# Patient Record
Sex: Male | Born: 1935
Health system: Southern US, Community
[De-identification: ages and names within clinical notes are randomized; demographics above are authoritative.]

## PROBLEM LIST (undated history)

## (undated) DIAGNOSIS — N183 Chronic kidney disease, stage 3 unspecified: Secondary | ICD-10-CM

## (undated) DIAGNOSIS — M109 Gout, unspecified: Secondary | ICD-10-CM

## (undated) DIAGNOSIS — I129 Hypertensive chronic kidney disease with stage 1 through stage 4 chronic kidney disease, or unspecified chronic kidney disease: Secondary | ICD-10-CM

## (undated) DIAGNOSIS — D0310 Melanoma in situ of unspecified eyelid, including canthus: Secondary | ICD-10-CM

## (undated) DIAGNOSIS — N400 Enlarged prostate without lower urinary tract symptoms: Secondary | ICD-10-CM

## (undated) DIAGNOSIS — J45909 Unspecified asthma, uncomplicated: Secondary | ICD-10-CM

## (undated) DIAGNOSIS — G4733 Obstructive sleep apnea (adult) (pediatric): Secondary | ICD-10-CM

## (undated) DIAGNOSIS — T7840XA Allergy, unspecified, initial encounter: Secondary | ICD-10-CM

## (undated) DIAGNOSIS — I4819 Other persistent atrial fibrillation: Secondary | ICD-10-CM

## (undated) DIAGNOSIS — R05 Cough: Secondary | ICD-10-CM

## (undated) DIAGNOSIS — R059 Cough, unspecified: Secondary | ICD-10-CM

## (undated) HISTORY — DX: Allergy, unspecified, initial encounter: T78.40XA

## (undated) HISTORY — DX: Melanoma in situ of unspecified eyelid, including canthus: D03.10

## (undated) HISTORY — DX: Gout, unspecified: M10.9

## (undated) HISTORY — DX: Other persistent atrial fibrillation: I48.19

## (undated) HISTORY — DX: Chronic kidney disease, stage 3 unspecified: N18.30

## (undated) HISTORY — DX: Hypertensive chronic kidney disease with stage 1 through stage 4 chronic kidney disease, or unspecified chronic kidney disease: I12.9

## (undated) HISTORY — DX: Obstructive sleep apnea (adult) (pediatric): G47.33

## (undated) HISTORY — DX: Cough: R05

## (undated) HISTORY — DX: Cough, unspecified: R05.9

## (undated) HISTORY — DX: Unspecified asthma, uncomplicated: J45.909

## (undated) HISTORY — DX: Chronic kidney disease, stage 3 (moderate): N18.3

## (undated) HISTORY — DX: Benign prostatic hyperplasia without lower urinary tract symptoms: N40.0

---

## 1998-05-04 ENCOUNTER — Encounter: Payer: Self-pay | Admitting: General Surgery

## 1998-05-04 ENCOUNTER — Ambulatory Visit (HOSPITAL_COMMUNITY): Admission: RE | Admit: 1998-05-04 | Discharge: 1998-05-04 | Payer: Self-pay | Admitting: General Surgery

## 2002-03-26 ENCOUNTER — Encounter: Payer: Self-pay | Admitting: General Surgery

## 2002-03-26 ENCOUNTER — Encounter: Admission: RE | Admit: 2002-03-26 | Discharge: 2002-03-26 | Payer: Self-pay | Admitting: General Surgery

## 2002-04-25 ENCOUNTER — Encounter: Admission: RE | Admit: 2002-04-25 | Discharge: 2002-05-15 | Payer: Self-pay | Admitting: Neurological Surgery

## 2004-05-26 ENCOUNTER — Ambulatory Visit (HOSPITAL_COMMUNITY): Admission: RE | Admit: 2004-05-26 | Discharge: 2004-05-26 | Payer: Self-pay | Admitting: *Deleted

## 2004-05-26 ENCOUNTER — Encounter (INDEPENDENT_AMBULATORY_CARE_PROVIDER_SITE_OTHER): Payer: Self-pay | Admitting: *Deleted

## 2005-03-09 ENCOUNTER — Ambulatory Visit: Admission: RE | Admit: 2005-03-09 | Discharge: 2005-03-09 | Payer: Self-pay | Admitting: Otolaryngology

## 2005-03-09 ENCOUNTER — Ambulatory Visit (HOSPITAL_BASED_OUTPATIENT_CLINIC_OR_DEPARTMENT_OTHER): Admission: RE | Admit: 2005-03-09 | Discharge: 2005-03-09 | Payer: Self-pay | Admitting: Otolaryngology

## 2005-03-09 ENCOUNTER — Encounter (INDEPENDENT_AMBULATORY_CARE_PROVIDER_SITE_OTHER): Payer: Self-pay | Admitting: Specialist

## 2005-04-28 ENCOUNTER — Encounter (INDEPENDENT_AMBULATORY_CARE_PROVIDER_SITE_OTHER): Payer: Self-pay | Admitting: *Deleted

## 2005-04-28 ENCOUNTER — Ambulatory Visit (HOSPITAL_COMMUNITY): Admission: RE | Admit: 2005-04-28 | Discharge: 2005-04-28 | Payer: Self-pay | Admitting: Gastroenterology

## 2005-08-03 ENCOUNTER — Encounter (INDEPENDENT_AMBULATORY_CARE_PROVIDER_SITE_OTHER): Payer: Self-pay | Admitting: Specialist

## 2005-08-03 ENCOUNTER — Ambulatory Visit (HOSPITAL_COMMUNITY): Admission: RE | Admit: 2005-08-03 | Discharge: 2005-08-03 | Payer: Self-pay | Admitting: Gastroenterology

## 2006-07-27 ENCOUNTER — Ambulatory Visit (HOSPITAL_COMMUNITY): Admission: RE | Admit: 2006-07-27 | Discharge: 2006-07-27 | Payer: Self-pay | Admitting: Internal Medicine

## 2010-03-31 ENCOUNTER — Emergency Department (HOSPITAL_COMMUNITY): Admission: EM | Admit: 2010-03-31 | Discharge: 2010-03-31 | Payer: Self-pay | Admitting: Emergency Medicine

## 2010-09-10 ENCOUNTER — Other Ambulatory Visit: Payer: Self-pay | Admitting: Dermatology

## 2010-10-01 NOTE — Op Note (Signed)
NAME:  Austin [name], Austin [name] NO.:  [id number]   MEDICAL RECORD NO.:  [id number]          PATIENT TYPE:  AMB   LOCATION:  ENDO                         FACILITY:  [facility]   PHYSICIAN:  Petra Kuba, M.D.    DATE OF BIRTH:  1930-06-01   DATE OF PROCEDURE:  04/28/2005  DATE OF DISCHARGE:                                 OPERATIVE REPORT   PROCEDURE:  Esophagogastroduodenoscopy with biopsy.   INDICATIONS:  Episodic guaiac positivity in a patient with a nondiagnostic  colonoscopy last year, history of melanoma.  Consent was signed after risks,  benefits, methods, and options thoroughly discussed in the office.   MEDICATIONS:  Demerol 75 mg, Versed 8 mg.   PROCEDURE:  The video endoscope was inserted by direct vision.  The  esophagus was normal except for a small hiatal hernia.  He possibly had a  very short segment of Barrett's.  The GE junction was biopsied at the end of  the procedure.  The scope was inserted into the stomach, advanced in the  antrum.  A small antral polyp was seen.  It was cold biopsied and put in a  separate container.  The scope was advanced through a normal pylorus into a  normal duodenal bulb and around the C-loop to a normal second and probably  third and possibly the fourth part of the duodenum.  No signs of bleeding or  duodenal abnormality were seen.  The scope was slowly withdrawn back to the  bulb, which again confirmed its normal appearance.  The scope was withdrawn  back in the stomach and retroflexed.  High in the cardia the hiatal hernia  was confirmed.  There was a small red spot in the proximal part of the  stomach, possibly a small AVM that was not bleeding.  Angularis, fundus,  lesser and greater curve were normal otherwise on retroflexed visualization.  Straight visualization of the stomach did not reveal any additional  findings.  The biopsies of the polyp at the GE junction were obtained at  this time and with suction, the scope was  slowly withdrawn and again a good  look at the rest of the esophagus was normal.  The scope was removed.  The  patient tolerated the procedure well.  There was no obvious immediate  complication.   ENDOSCOPIC DIAGNOSES:  1.  Small hiatal hernia, doubt Barrett's, status post biopsy.  2.  Antral small polyp, broad-based; however, status post biopsy, slightly      atypical-looking.  3.  One proximal stomach red spot, possible arteriovenous malformation.  4.  Otherwise normal esophagogastroduodenoscopy.   PLAN:  Await pathology.  If the biopsies are nonworrisome, consider  rechecking guaiacs and CBC in one month just to make sure no further workup  plans are needed; however, may need more aggressive polypectomy pending  pathology.           ______________________________  Petra Kuba, M.D.     MEM/MEDQ  D:  04/28/2005  T:  04/29/2005  Job:  [id number]   cc:   Geoffry Paradise, M.D.  Fax: [phone]

## 2010-10-01 NOTE — Op Note (Signed)
NAME:  Austin [name], Austin [name] NO.:  [id number]   MEDICAL RECORD NO.:  [id number]          PATIENT TYPE:  AMB   LOCATION:  ENDO                         FACILITY:  [facility]   PHYSICIAN:  Petra Kuba, M.D.    DATE OF BIRTH:  [birth year]   DATE OF PROCEDURE:  08/03/2005  DATE OF DISCHARGE:                                 OPERATIVE REPORT   PROCEDURE PERFORMED:  Esophagogastroduodenoscopy with biopsy.   ENDOSCOPIST:  Petra Kuba, M.D.   INDICATIONS FOR PROCEDURE:  Want to recheck gastric polyp.  Consent was  signed after the risks, benefits, methods and options were thoroughly  discussed multiple times in the past.   MEDICINES USED:  Fentanyl 50 mcg, Versed 4 mg.   DESCRIPTION OF PROCEDURE:  The video endoscope was inserted by direct  vision.  Esophagus was normal.  He did have a small hiatal hernia.  Scope  passed into the stomach and advanced to the antrum.  The polyp was not any  worse-looking but slightly ulcerative, semi-sessile.  Multiple biopsies were  obtained using the Jumbo forceps.  The scope was inserted through a normal  pylorus into a normal duodenal bulb and around the C-loop to a normal second  portion of the duodenum.  The scope was withdrawn back to the bulb and a  good look there ruled out abnormalities in that location.  Scope was  withdrawn back to the stomach which was evaluated on straight and retroflex  visualization with good look at the cardia, fundus, angularis, lesser and  greater curve and no additional findings were seen.  After the biopsies were  obtained, air was suctioned, scope was slowly withdrawn.  Again, a good look  at the esophagus was normal.  Scope was removed.  The patient tolerated the  procedure well.  There were no obvious immediate complication.   ENDOSCOPIC DIAGNOSIS:  1.  Small hiatal hernia.  2.  Antral polyp, no change.  Status post biopsies with Jumbo forceps.  3.  Otherwise within normal limits  esophagogastroduodenoscopy.   PLAN:  Await pathology.  I have recommended using Prilosec over-the-counter  if he is using lots of Indocin to help protect the stomach.  Happy to see  back p.r.n. or in three to four months just to check and make sure no  further work-up plans are needed.           ______________________________  Petra Kuba, M.D.     MEM/MEDQ  D:  08/03/2005  T:  08/04/2005  Job:  [id number]   cc:   Geoffry Paradise, M.D.  Fax: [phone]

## 2010-10-01 NOTE — Op Note (Signed)
NAME:  Austin [name], Austin [name] NO.:  [id number]   MEDICAL RECORD NO.:  [id number]          PATIENT TYPE:  AMB   LOCATION:  DSC                          FACILITY:  [facility]   PHYSICIAN:  Karol T. Lazarus Salines, M.D. DATE OF BIRTH:  08/18/1930   DATE OF PROCEDURE:  03/09/2005  DATE OF DISCHARGE:                                 OPERATIVE REPORT   PREOPERATIVE DIAGNOSIS:  Possible melanoma, right conchal bowl.   POSTOPERATIVE DIAGNOSIS:  Possible melanoma, right conchal bowl.   PROCEDURE PERFORMED:  Wide excision, right conchal bowl, with full-thickness  skin graft reconstruction (postauricular donor site).   SURGEON:  Gloris Manchester. Lazarus Salines, M.D.   ANESTHESIA:  Local.   BLOOD LOSS:  Minimal.   COMPLICATIONS:  None.   FINDINGS:  A small healing ulcer from a previous shave biopsy per Dr.  Londell Moh in the posterior aspect of the conchal bowl.  Wide excision  accomplished encompassing approximately 1 cm margin and including auricular  cartilage, with no additional findings.   PROCEDURE:  With the patient in a comfortable supine position, a minimal  hair shave was performed.  Xylocaine 1% with 1:100,000 epinephrine, 15 mL  total, was infiltrated into the postauricular crease, against the post  auricular skin, and on both surfaces of the pinna in stages.  A sterile  preparation and draping of the right ear was accomplished.   Anesthesia was tested with an Adson forceps and was observed to be adequate.  The lesion was identified as above.  Ink marks were placed circumferentially  around the lesion at roughly 1 cm interval for operative orientation.  Under  direct vision, the skin was incised and carried down to and then through the  auricular cartilage.  This was done circumferentially around the lesion.  Hemostasis was spontaneous.  The cartilage was elevated and the  perichondrium was dissected off of the posterior surface using a [employer] researcher.  The lesion was oriented with silk  sutures and then was removed.  Hemostasis was spontaneous.   A roughly 2.5 defect remained.  The same size defect was outlined in the  post auricular skin and sharply incised and carried into the dermis.  The  skin graft was carefully removed in the dermal layer with mild oozing from  the donor site.  Upon removing the graft, it was placed inverted on a tongue  blade and the subdermal layer was carefully and sharply removed to thin the  graft to a thick full-thickness graft.  This was placed into the recipient  site and oriented appropriately.  It was secured with interrupted 5-0  chromic sutures around the anterior periphery, and with some deep quilting  stitches to attach the skin graft to the perichondrium.  The skin over the  antihelix was rolled down into the defect for a slight distance with the  same material and then the skin graft was approximated.  A good closure of  the entire defect was accomplished.   Returning to the donor site, the subcuticular fat was dissected  to relax  the wound.  It was closed along preexisting wrinkles but a  small gap was  left.  Ethilon 4-0 sutures were used.  Hemostasis was observed.   Bacitracin ointment was applied to all the raw surfaces.  A standard Kerlix  fluff and Kling mastoid dressing was applied.  The patient had been awake  for the entire procedure and tolerated it well.  At this point he was  returned to the holding area in preparation for discharge home.   COMMENT:  A 77 year old white male with a pigmented lesion of the conchal  bowl times several years with recent change in appearance.  A shave biopsy  per Dr. Leta Speller suggested possible melanoma in situ, hence the  indication for today's procedure.  Anticipate a routine postoperative  recovery with attention to ice, elevation, analgesia, antibiosis.  Will  remove the dressing in three days and then clean the wound thereafter.  I  will call them with a pathology report and  will make any further decisions  as appropriate.      Gloris Manchester. Lazarus Salines, M.D.  Electronically Signed     KTW/MEDQ  D:  03/09/2005  T:  03/09/2005  Job:  [id number]   cc:   Norval Gable. [place], M.D.  Fax: [phone]   Geoffry Paradise, M.D.  Fax: [phone]

## 2010-12-17 ENCOUNTER — Other Ambulatory Visit: Payer: Self-pay | Admitting: Dermatology

## 2011-03-02 DIAGNOSIS — H02059 Trichiasis without entropian unspecified eye, unspecified eyelid: Secondary | ICD-10-CM | POA: Insufficient documentation

## 2011-03-02 DIAGNOSIS — C6900 Malignant neoplasm of unspecified conjunctiva: Secondary | ICD-10-CM | POA: Insufficient documentation

## 2011-04-19 ENCOUNTER — Other Ambulatory Visit: Payer: Self-pay | Admitting: Dermatology

## 2011-06-01 DIAGNOSIS — H16239 Neurotrophic keratoconjunctivitis, unspecified eye: Secondary | ICD-10-CM | POA: Insufficient documentation

## 2011-06-24 ENCOUNTER — Other Ambulatory Visit: Payer: Self-pay | Admitting: Dermatology

## 2012-01-20 ENCOUNTER — Other Ambulatory Visit: Payer: Self-pay | Admitting: Dermatology

## 2012-04-23 ENCOUNTER — Other Ambulatory Visit: Payer: Self-pay | Admitting: Dermatology

## 2012-10-05 ENCOUNTER — Other Ambulatory Visit: Payer: Self-pay | Admitting: Dermatology

## 2012-12-25 ENCOUNTER — Other Ambulatory Visit: Payer: Self-pay

## 2013-03-13 ENCOUNTER — Other Ambulatory Visit: Payer: Self-pay | Admitting: Dermatology

## 2013-03-19 ENCOUNTER — Encounter: Payer: Self-pay | Admitting: Internal Medicine

## 2013-03-19 ENCOUNTER — Ambulatory Visit (INDEPENDENT_AMBULATORY_CARE_PROVIDER_SITE_OTHER): Payer: Medicare Other | Admitting: Internal Medicine

## 2013-03-19 ENCOUNTER — Ambulatory Visit
Admission: RE | Admit: 2013-03-19 | Discharge: 2013-03-19 | Disposition: A | Discharge status: Home / Self Care | Payer: Medicare Other | Source: Ambulatory Visit | Attending: Internal Medicine | Admitting: Internal Medicine

## 2013-03-19 VITALS — BP 131/81 | HR 96 | Temp 97.8°F | Ht 67.5 in | Wt 165.0 lb

## 2013-03-19 DIAGNOSIS — R509 Fever, unspecified: Secondary | ICD-10-CM

## 2013-03-19 DIAGNOSIS — D0310 Melanoma in situ of unspecified eyelid, including canthus: Secondary | ICD-10-CM | POA: Insufficient documentation

## 2013-03-19 LAB — CBC WITH DIFFERENTIAL/PLATELET
Basophils Relative: 0 % (ref 0–1)
Eosinophils Absolute: 0.3 10*3/uL (ref 0.0–0.7)
Eosinophils Relative: 4 % (ref 0–5)
HCT: 45.3 % (ref 39.0–52.0)
Hemoglobin: 16.5 g/dL (ref 13.0–17.0)
MCH: 31.3 pg (ref 26.0–34.0)
MCHC: 36.4 g/dL — ABNORMAL HIGH (ref 30.0–36.0)
MCV: 86 fL (ref 78.0–100.0)
Monocytes Absolute: 1 10*3/uL (ref 0.1–1.0)
Monocytes Relative: 12 % (ref 3–12)

## 2013-03-19 LAB — LACTATE DEHYDROGENASE: LDH: 184 U/L (ref 94–250)

## 2013-03-19 NOTE — Assessment & Plan Note (Addendum)
He has previously had a workup and I will add to it a malaria slide, a repeat CBC and quantity are on along with chest x-ray to be sure there is no adenopathy. I discussed with him and his wife at length that other evaluation is not indicated since he is not having any concerning signs such as weight loss. Typically since his fevers have really been intermittent happening will generally less than one/month.  At this time, she will be called if there are any significant abnormalities, otherwise he will return if he has any new signs, worsening symptoms. He is going to otherwise followup with his primary physician for further symptoms and I did explain that this can last sometimes over a year but generally is associated with a good prognosis when no other symptoms are found.   He is comfortable with this plan and information was given to call if he has any concerns or if there any changes.  ADDENDUM 03/28/13: CT chest done to follow up on CXR.  Nodule noted but small.  I do not feel it is related to the fever and routine monitoring is be ok (repeat in 3 months CT without contrast).  If significantly changed, he will need further follow up.

## 2013-03-19 NOTE — Progress Notes (Signed)
Subjective:    Patient ID: Austin [name], male    DOB: 1931-03-15, 79 y.o.   MRN: [id number]  HPI He comes in for evaluation as a new patient.  He is being seen for fever of unknown origin. History is provided by the patient and his wife. He has a history of melanoma of the right eyelid and otherwise has done well. He first developed fever in February of this year and develops a temperature of 202 associated with chills. He then has had periodic fever about monthly including March, 2 times in April, May June twice and then not again until September and October. Each time fever went up to or about at 102.  No associated symptoms including no cough, shortness of breath, diarrhea, back pain, rashes, leg swelling, joint swelling or joint aches, no myalgias, no headache, no dizziness, no lymphadenopathy. He has had no weight loss during this time and otherwise feels his usual self except for the day he has the fever which tends to make him tired. He has traveled to [name] 7 years ago and otherwise in the last year has been to the [place]. He also has travel to [place]. No sick contacts. He was exposed to somebody with tuberculosis when he was a baby. Does not recall another PPD or chest for that since. He has had a CT scan of his abdomen as well as an MRI of his abdomen done at [facility] for his melanoma followup and is without any significant abnormalities. He did have evaluation by his primary doctor in May of 2014 when this first began and was essentially reassuring with a CBC that was within normal limits, CMP that was normal, a protein electrophoresis that was normal, Lyme antibody was negative, CEA was normal, CEA 19-9 was normal recommend spotted fever was normal, ESR and CRP were within normal limits and blood cultures done were negative. He did not followup after that in regards to this until mid October when he complained of it occurring still. No night sweats. No new symptoms after his initial  evaluation for May. No other evaluation. His MRI at [facility] was reviewed and no significant abnormalities. His last chest x-ray was 2012. He has not been on any antibiotics for this.   Review of Systems  Constitutional: Positive for fever and chills. Negative for diaphoresis, activity change, appetite change, fatigue and unexpected weight change.  HENT: Negative for sore throat.   Eyes: Negative for discharge and visual disturbance.  Respiratory: Negative for cough and shortness of breath.   Cardiovascular: Negative for chest pain, palpitations and leg swelling.  Gastrointestinal: Negative for nausea, abdominal pain and diarrhea.  Endocrine: Negative for cold intolerance and heat intolerance.  Genitourinary: Negative for dysuria, hematuria and testicular pain.  Skin: Negative for rash and wound.  Allergic/Immunologic: Negative for immunocompromised state.  Neurological: Negative for dizziness, seizures, facial asymmetry, weakness, light-headedness, numbness and headaches.  Hematological: Negative for adenopathy.  Psychiatric/Behavioral: Negative for dysphoric mood. The patient is not nervous/anxious.        Objective:   Physical Exam  Constitutional: He is oriented to person, place, and time. He appears well-developed and well-nourished. No distress.  HENT:  Mouth/Throat: No oropharyngeal exudate.  Eyes: Right eye exhibits no discharge. Left eye exhibits no discharge. No scleral icterus.  Cardiovascular: Normal rate, regular rhythm and normal heart sounds.   No murmur heard. Pulmonary/Chest: Effort normal and breath sounds normal. No respiratory distress. He has no wheezes.  Abdominal: Soft. Bowel sounds are normal.  He exhibits no distension.  Lymphadenopathy:    He has no cervical adenopathy.  Neurological: He is alert and oriented to person, place, and time.  Skin: Skin is warm and dry. No rash noted.  Psychiatric: He has a normal mood and affect. His behavior is normal.           Assessment & Plan:

## 2013-03-19 NOTE — Addendum Note (Signed)
Addended by: Gardiner Barefoot on: 03/19/2013 04:34 PM   Modules accepted: Level of Service

## 2013-03-20 LAB — MALARIA SMEAR
Result - MALAR: NEGATIVE
Result - MALAR: NONE SEEN

## 2013-03-21 ENCOUNTER — Other Ambulatory Visit: Payer: Self-pay | Admitting: Internal Medicine

## 2013-03-21 ENCOUNTER — Telehealth: Payer: Self-pay | Admitting: *Deleted

## 2013-03-21 DIAGNOSIS — R911 Solitary pulmonary nodule: Secondary | ICD-10-CM

## 2013-03-21 LAB — QUANTIFERON TB GOLD ASSAY (BLOOD)
Interferon Gamma Release Assay: NEGATIVE
TB Ag value: 0.03 IU/mL

## 2013-03-21 NOTE — Telephone Encounter (Signed)
CT scheduled for 03/26/13 at [facility], patient notified. Austin [name]

## 2013-03-21 NOTE — Telephone Encounter (Signed)
Message copied by Macy Mis on Thu Mar 21, 2013 10:45 AM ------      Message from: Gardiner Barefoot      Created: Thu Mar 21, 2013  8:46 AM       He has a possible nodule on his chest xray, though it likely is a shadow.  The radiologist has recommended a CT scan of his chest and I think with the ongoing fever, he should have this done to be sure.  CT chest without contrast is fine.  Not urgent. Alll other labs are normal to date.  thanks ------

## 2013-03-22 ENCOUNTER — Telehealth: Payer: Self-pay | Admitting: *Deleted

## 2013-03-22 NOTE — Telephone Encounter (Signed)
Obtained PA for scheduled CT.  Procedure authorized through 04/20/13, approval # [id number]. Andree Coss, RN

## 2013-03-26 ENCOUNTER — Ambulatory Visit
Admission: RE | Admit: 2013-03-26 | Discharge: 2013-03-26 | Disposition: A | Discharge status: Home / Self Care | Payer: Medicare Other | Source: Ambulatory Visit | Attending: Internal Medicine | Admitting: Internal Medicine

## 2013-03-26 DIAGNOSIS — R911 Solitary pulmonary nodule: Secondary | ICD-10-CM

## 2013-03-27 ENCOUNTER — Telehealth: Payer: Self-pay | Admitting: *Deleted

## 2013-03-27 NOTE — Telephone Encounter (Signed)
Message copied by Macy Mis on Wed Mar 27, 2013  1:57 PM ------      Message from: Gardiner Barefoot      Created: Wed Mar 27, 2013  1:27 PM       CT scan of chest looks fine.  It does show a tiny (6 mm) nodule that is what was seen on x ray.  It does not at all explain the fever and is not significant.  Radiology does suggest a repeat in 3 months to assure it is not changing to be extra sure.          Please let the patient know and I will let his PCP know to follow up with it.  thanks ------

## 2013-03-27 NOTE — Telephone Encounter (Signed)
Left voice mail to return call. Please see Dr. Ephriam Knuckles note. [name]

## 2013-09-10 ENCOUNTER — Other Ambulatory Visit: Payer: Self-pay | Admitting: Internal Medicine

## 2013-09-10 DIAGNOSIS — R918 Other nonspecific abnormal finding of lung field: Secondary | ICD-10-CM

## 2013-09-11 ENCOUNTER — Other Ambulatory Visit: Payer: Self-pay | Admitting: Dermatology

## 2013-09-17 ENCOUNTER — Ambulatory Visit
Admission: RE | Admit: 2013-09-17 | Discharge: 2013-09-17 | Disposition: A | Discharge status: Home / Self Care | Payer: Medicare Other | Source: Ambulatory Visit | Attending: Internal Medicine | Admitting: Internal Medicine

## 2013-09-17 ENCOUNTER — Encounter (INDEPENDENT_AMBULATORY_CARE_PROVIDER_SITE_OTHER): Payer: Self-pay

## 2013-09-17 DIAGNOSIS — R918 Other nonspecific abnormal finding of lung field: Secondary | ICD-10-CM

## 2014-03-07 ENCOUNTER — Other Ambulatory Visit: Payer: Self-pay | Admitting: Internal Medicine

## 2014-03-07 ENCOUNTER — Other Ambulatory Visit (HOSPITAL_COMMUNITY): Payer: Self-pay | Admitting: Internal Medicine

## 2014-03-07 ENCOUNTER — Ambulatory Visit
Admission: RE | Admit: 2014-03-07 | Discharge: 2014-03-07 | Disposition: A | Discharge status: Home / Self Care | Payer: Medicare Other | Source: Ambulatory Visit | Attending: Internal Medicine | Admitting: Internal Medicine

## 2014-03-07 ENCOUNTER — Ambulatory Visit (HOSPITAL_COMMUNITY)
Admission: RE | Admit: 2014-03-07 | Discharge: 2014-03-07 | Disposition: A | Discharge status: Home / Self Care | Payer: Medicare Other | Source: Ambulatory Visit | Attending: Internal Medicine | Admitting: Internal Medicine

## 2014-03-07 DIAGNOSIS — R404 Transient alteration of awareness: Secondary | ICD-10-CM | POA: Diagnosis not present

## 2014-06-05 ENCOUNTER — Other Ambulatory Visit: Payer: Self-pay | Admitting: Dermatology

## 2014-09-04 ENCOUNTER — Other Ambulatory Visit: Payer: Self-pay | Admitting: Dermatology

## 2014-09-23 ENCOUNTER — Other Ambulatory Visit: Payer: Self-pay | Admitting: Gastroenterology

## 2015-03-19 ENCOUNTER — Ambulatory Visit (INDEPENDENT_AMBULATORY_CARE_PROVIDER_SITE_OTHER): Payer: Medicare Other | Admitting: Neurology

## 2015-03-19 ENCOUNTER — Encounter: Payer: Self-pay | Admitting: Neurology

## 2015-03-19 VITALS — BP 132/83 | HR 76 | Resp 16 | Ht 68.5 in | Wt 166.4 lb

## 2015-03-19 DIAGNOSIS — J441 Chronic obstructive pulmonary disease with (acute) exacerbation: Secondary | ICD-10-CM | POA: Diagnosis not present

## 2015-03-19 DIAGNOSIS — R0689 Other abnormalities of breathing: Secondary | ICD-10-CM | POA: Insufficient documentation

## 2015-03-19 DIAGNOSIS — R0683 Snoring: Secondary | ICD-10-CM | POA: Diagnosis not present

## 2015-03-19 DIAGNOSIS — J4530 Mild persistent asthma, uncomplicated: Secondary | ICD-10-CM | POA: Diagnosis not present

## 2015-03-19 NOTE — Patient Instructions (Signed)

## 2015-03-19 NOTE — Progress Notes (Signed)
SLEEP MEDICINE CLINIC   Provider:  Larey Seat, M D  Referring Provider: Burnard Bunting, MD Primary Care Physician:  Geoffery Lyons, MD  Chief Complaint  Patient presents with  . NP Reynaldo Minium  Sleep Apnea/ Snoring  Rm 10   Chief complaint according to patient : " I snore "   HPI:  Austin [name] is a 81 y.o. male , seen here as a referral from Dr. Reynaldo Minium for a sleep consultation,  Mr. Younglove is seen here in the presence of his wife out of concern for his sleep breathing. The patient has a history of asthma and COPD as well as hypertension. He has a history of corneal ulcer form shingles and has cancer. He was treated with chemotherapy in the treatment of melanoma and developed cataracts. By now he has lost sight in his right eye which is cloudy with neovascularization. Mr. Fitzhenry has been witnessed to snore and his wife has occasionally noted him to grasp or bruits a little irregular but she is not sure that he has any apnea per se. He does not sleep in supine position but on his side. Occasionally he will wake up from an gasping for air or from a snorer and that is more likely if he fell asleep in a recliner or in a seated position.    Sleep habits are as follows: 9.00 PM is his usual bedtime but he developed a very long sleep latency. He is usually ready by the time he goes to bed to sleep and does not feel that he has racing thoughts or extreme worry is keeping him from sleeping. He has begun taking a sleep aid 1 hour before intended bedtime which helps him to initiate sleep, but sleep is interrupted 2-3 times by his urge to urinate. He rises in the morning at 5:30 spontaneously or being woken by his dog. He feels ready to go in the morning restored or refreshed. After lunch he likes to nap this is usually 15-30 minutes. He does not report dreams and his nap time and he does not dream a lot at night either. His wife has reported that he sleep talks. He is not a sleep walker  and he reports no nightmares, or REM behavior. He will get about 7 hours of nocturnal sleep, sleeps on his side not in supine. The bedroom is cool, quiet and dark.   Sleep medical history and family sleep history: all siblings deceased, no known sleep disorder history.   Social history:  Married, right-handed Caucasian male retired. 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 He is a nonsmoker, he does drink alcohol socially, less than 4 drinks a week. 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  Review of Systems: Out of a complete 14 system review, the patient complains of only the following symptoms, and all other reviewed systems are negative.  skin lesions, itching,  Blindness right eye .  Epworth score 7, Fatigue severity score 35   , depression score see below.    Social History   Social History  . Marital Status: Married    Spouse Name: [name]  . Number of Children: [name]  . Years of Education: [name]   Occupational History  . Not on file.   Social History Main Topics  . Smoking status: Never Smoker   . Smokeless tobacco: Never Used  . Alcohol Use: 0.6 oz/week    1 Standard drinks or equivalent per week  Comment: occ  . Drug Use: No  . Sexual Activity: Not on file   Other Topics Concern  . Not on file   Social History Narrative   Right Handed.  Caffiene  Coffee, tea 3 cups daily.   Pt retired.     Lives at home.      Family History  Problem Relation Age of Onset  . Hypertension Father   . Lung cancer Father     Past Medical History  Diagnosis Date  . Melanoma in situ of right eyelid ([place])   . Allergy   . Asthma   . Gout   . Cough   . Snoring   . CKD (chronic kidney disease) stage 3, GFR 30-59 ml/min   . Hypertensive chronic kidney disease   . BPH (benign prostatic hypertrophy)     Past Surgical History  Procedure Laterality Date  . Eye surgery      Current Outpatient Prescriptions  Medication Sig Dispense  Refill  . albuterol (PROVENTIL) (2.5 MG/3ML) 0.083% nebulizer solution Take 2.5 mg by nebulization every 6 (six) hours as needed for wheezing or shortness of breath.    Marland Kitchen amLODipine (NORVASC) 10 MG tablet Take 10 mg by mouth daily.    . budesonide-formoterol (SYMBICORT) 160-4.5 MCG/ACT inhaler Inhale 2 puffs into the lungs 2 (two) times daily.    . butalbital-acetaminophen-caffeine (FIORICET WITH CODEINE) 50-325-40-30 MG per capsule Take 1 capsule by mouth every 4 (four) hours as needed for headache.    . chlorpheniramine-HYDROcodone (TUSSIONEX PENNKINETIC ER) 10-8 MG/5ML SUER Take 5 mLs by mouth.    . clobetasol cream (TEMOVATE) 0.05 %     . fluocinonide gel (LIDEX) 0.05 %     . fluticasone (FLONASE) 50 MCG/ACT nasal spray Place 2 sprays into both nostrils daily.  8  . gabapentin (NEURONTIN) 300 MG capsule TAKE 1 CAPSULE NIGHTLY AND INCREASE TO 1 CAPSULE EVERY 8 HOURS AS TOLERATED  5  . hydrOXYzine (ATARAX/VISTARIL) 25 MG tablet     . ibuprofen (ADVIL,MOTRIN) 200 MG tablet Take 200 mg by mouth every 6 (six) hours as needed.    . indomethacin (INDOCIN) 50 MG capsule Take 50 mg by mouth 2 (two) times daily with a meal.    . LORazepam (ATIVAN) 1 MG tablet Take 1 mg by mouth.    . losartan (COZAAR) 50 MG tablet Take 50 mg by mouth daily.    Marland Kitchen losartan (COZAAR) 50 MG tablet Take 50 mg by mouth.    . magnesium oxide (MAG-OX) 400 MG tablet Take 400 mg by mouth at bedtime.    Marland Kitchen oxyCODONE-acetaminophen (PERCOCET) 10-325 MG per tablet Take 1 tablet by mouth every 4 (four) hours as needed for pain.    . predniSONE (DELTASONE) 20 MG tablet daily as needed.  4  . valACYclovir (VALTREX) 1000 MG tablet Take 1,000 mg by mouth daily.    Marland Kitchen zolpidem (AMBIEN) 10 MG tablet Take 10 mg by mouth at bedtime as needed. for sleep  5   No current facility-administered medications for this visit.    Allergies as of 03/19/2015 - Review Complete 03/19/2015  Allergen Reaction Noted  . Ivp dye [iodinated diagnostic  agents]  03/19/2013  . Penicillins Swelling 03/19/2013  . Codeine Rash 03/19/2013    Vitals: BP 132/83 mmHg  Pulse 76  Resp 16  Ht 5' 8.5" (1.74 m)  Wt 166 lb 6.4 oz (75.479 kg)  BMI 24.93 kg/m2 Last Weight:  Wt Readings from Last 1 Encounters:  03/19/15 166  lb 6.4 oz (75.479 kg)   PJS:RPRX mass index is 24.93 kg/(m^2).     Last Height:   Ht Readings from Last 1 Encounters:  03/19/15 5' 8.5" (1.74 m)    Physical exam:  General: The patient is awake, alert and appears not in acute distress. The patient is well groomed. Head: Normocephalic, atraumatic. Neck is supple. Mallampati 3  neck circumference:15. Nasal airflow  unrestricted , TMJ is evident . Retrognathia is not seen.  Cardiovascular:  Regular rate and rhythm , without  murmurs or carotid bruit, and without distended neck veins. Respiratory: Lungs are clear to auscultation. Skin:  Without evidence of edema, or rash Trunk: BMI is normal . The patient's posture is erect  Neurologic exam : The patient is awake and alert, oriented to place and time.   Memory subjective described as intact.  lity appears normal.  Speech is fluent,  without dysarthria, dysphonia or aphasia.  Mood and affect are appropriate.  Cranial nerves: Pupils are equal and briskly reactive to light. Funduscopic exam without  evidence of pallor or edema.  Extraocular movements  in vertical and horizontal planes intact and without nystagmus. Visual fields by finger perimetry are intact. Hearing to finger rub intact.   Facial sensation intact to fine touch.  Facial motor strength is symmetric and tongue and uvula move midline. Shoulder shrug was symmetrical.   Motor exam:  Normal tone, muscle bulk and symmetric strength in all extremities.  Sensory:  Fine touch, pinprick and vibration were tested in all extremities. Proprioception tested in the upper extremities was normal.  Coordination: Rapid alternating movements in the fingers/hands was normal.  Finger-to-nose maneuver  normal without evidence of ataxia, dysmetria or tremor.  Gait and station: Patient walks without assistive device and is able unassisted to climb up to the exam table. Strength within normal limits.  Stance is stable and normal.  Toe and heel stand were tested .Tandem gait is unfragmented. Turns with 3 Steps. Romberg testing is negative.  Deep tendon reflexes: in the upper and lower extremities are symmetric and intact. Babinski maneuver response is downgoing.  The patient was advised of the nature of the diagnosed sleep disorder , the treatment options and risks for general a health and wellness arising from not treating the condition.  I spent more than 35  minutes of face to face time with the patient. Greater than 50% of time was spent in counseling and coordination of care. We have discussed the diagnosis and differential and I answered the patient's questions.     Assessment:  After physical and neurologic examination, review of laboratory studies,  Personal review of imaging studies, reports of other /same  Imaging studies ,  Results of polysomnography/ neurophysiology testing and pre-existing records as far as provided in visit., my assessment is   1) Mr. Greulich has moderate risk factors IH and gender for sleep apnea but he is neither obese nor does he have a narrow upper airway. From his wife's observation I would conclude that he may be having hypotony is snoring a more mild form of nocturnal wheezing . He does not seem to breath irregularly. His comorbidities are COPD and asthma and he may well have hypoxemia and hypercapnia. I will order a sleep study as a split-night protocol with capnography. We will meet after the sleep study to discuss the best treatment options if only a mild apnea is present and oxygen desaturations are mild I would refer him for a dental device rather than a  CPAP machine. 2) nocturnal leg cramps- obtain circulatory data with PCP.  No RLS  by description.   Plan:  Treatment plan and additional workup :  Split AHi 20 and score at 3 % ,    Larey Seat MD  03/19/2015   CC: Burnard Bunting, [address] [place], [place] [address]

## 2015-04-05 ENCOUNTER — Ambulatory Visit (INDEPENDENT_AMBULATORY_CARE_PROVIDER_SITE_OTHER): Payer: Medicare Other | Admitting: Neurology

## 2015-04-05 DIAGNOSIS — G4733 Obstructive sleep apnea (adult) (pediatric): Secondary | ICD-10-CM | POA: Diagnosis not present

## 2015-04-05 DIAGNOSIS — J441 Chronic obstructive pulmonary disease with (acute) exacerbation: Secondary | ICD-10-CM

## 2015-04-05 DIAGNOSIS — J4530 Mild persistent asthma, uncomplicated: Secondary | ICD-10-CM

## 2015-04-05 NOTE — Sleep Study (Signed)
Please see the scanned sleep study interpretation located in the Procedure tab within the Chart Review section. 

## 2015-04-06 ENCOUNTER — Emergency Department (HOSPITAL_BASED_OUTPATIENT_CLINIC_OR_DEPARTMENT_OTHER)
Admission: EM | Admit: 2015-04-06 | Discharge: 2015-04-06 | Disposition: A | Discharge status: Home / Self Care | Payer: Medicare Other | Attending: Emergency Medicine | Admitting: Emergency Medicine

## 2015-04-06 ENCOUNTER — Encounter (HOSPITAL_BASED_OUTPATIENT_CLINIC_OR_DEPARTMENT_OTHER): Payer: Self-pay | Admitting: *Deleted

## 2015-04-06 DIAGNOSIS — N183 Chronic kidney disease, stage 3 (moderate): Secondary | ICD-10-CM | POA: Insufficient documentation

## 2015-04-06 DIAGNOSIS — Z880 Allergy status to penicillin: Secondary | ICD-10-CM | POA: Diagnosis not present

## 2015-04-06 DIAGNOSIS — J45909 Unspecified asthma, uncomplicated: Secondary | ICD-10-CM | POA: Insufficient documentation

## 2015-04-06 DIAGNOSIS — Z85840 Personal history of malignant neoplasm of eye: Secondary | ICD-10-CM | POA: Diagnosis not present

## 2015-04-06 DIAGNOSIS — R0600 Dyspnea, unspecified: Secondary | ICD-10-CM | POA: Diagnosis present

## 2015-04-06 DIAGNOSIS — Z8739 Personal history of other diseases of the musculoskeletal system and connective tissue: Secondary | ICD-10-CM | POA: Diagnosis not present

## 2015-04-06 DIAGNOSIS — Z79899 Other long term (current) drug therapy: Secondary | ICD-10-CM | POA: Insufficient documentation

## 2015-04-06 DIAGNOSIS — T782XXA Anaphylactic shock, unspecified, initial encounter: Secondary | ICD-10-CM

## 2015-04-06 DIAGNOSIS — Z7951 Long term (current) use of inhaled steroids: Secondary | ICD-10-CM | POA: Diagnosis not present

## 2015-04-06 DIAGNOSIS — I129 Hypertensive chronic kidney disease with stage 1 through stage 4 chronic kidney disease, or unspecified chronic kidney disease: Secondary | ICD-10-CM | POA: Diagnosis not present

## 2015-04-06 LAB — CBC WITH DIFFERENTIAL/PLATELET
Basophils Absolute: 0 10*3/uL (ref 0.0–0.1)
Basophils Relative: 0 %
Eosinophils Absolute: 0 10*3/uL (ref 0.0–0.7)
Eosinophils Relative: 0 %
HCT: 50.6 % (ref 39.0–52.0)
HEMOGLOBIN: 16.8 g/dL (ref 13.0–17.0)
LYMPHS ABS: 4.7 10*3/uL — AB (ref 0.7–4.0)
Lymphocytes Relative: 27 %
MCH: 30.4 pg (ref 26.0–34.0)
MCHC: 33.2 g/dL (ref 30.0–36.0)
MCV: 91.5 fL (ref 78.0–100.0)
MONOS PCT: 6 %
Monocytes Absolute: 1.1 10*3/uL — ABNORMAL HIGH (ref 0.1–1.0)
NEUTROS ABS: 11.9 10*3/uL — AB (ref 1.7–7.7)
NEUTROS PCT: 67 %
PLATELETS: 348 10*3/uL (ref 150–400)
RBC: 5.53 MIL/uL (ref 4.22–5.81)
RDW: 14.6 % (ref 11.5–15.5)
WBC: 17.7 10*3/uL — ABNORMAL HIGH (ref 4.0–10.5)

## 2015-04-06 LAB — BASIC METABOLIC PANEL
Anion gap: 12 (ref 5–15)
BUN: 26 mg/dL — ABNORMAL HIGH (ref 6–20)
CALCIUM: 8.9 mg/dL (ref 8.9–10.3)
CHLORIDE: 105 mmol/L (ref 101–111)
CO2: 23 mmol/L (ref 22–32)
CREATININE: 1.49 mg/dL — AB (ref 0.61–1.24)
GFR, EST AFRICAN AMERICAN: 49 mL/min — AB (ref >60)
GFR, EST NON AFRICAN AMERICAN: 42 mL/min — AB (ref >60)
Glucose, Bld: 146 mg/dL — ABNORMAL HIGH (ref 65–99)
Potassium: 4 mmol/L (ref 3.5–5.1)
SODIUM: 140 mmol/L (ref 135–145)

## 2015-04-06 MED ORDER — PREDNISONE 10 MG PO TABS: 40.0000 mg | ORAL_TABLET | Freq: Every day | ORAL | Status: DC

## 2015-04-06 MED ORDER — DIPHENHYDRAMINE HCL 25 MG PO TABS
25.0000 mg | ORAL_TABLET | Freq: Four times a day (QID) | ORAL | Status: DC | PRN
Start: 2015-04-06 — End: 2015-09-09

## 2015-04-06 MED ORDER — METHYLPREDNISOLONE SODIUM SUCC 125 MG IJ SOLR
INTRAMUSCULAR | Status: AC
Start: 2015-04-06 — End: 2015-04-06
  Administered 2015-04-06: 125 mg via INTRAVENOUS
  Filled 2015-04-06: qty 2

## 2015-04-06 MED ORDER — FAMOTIDINE 20 MG PO TABS: 20.0000 mg | ORAL_TABLET | Freq: Two times a day (BID) | ORAL | Status: DC

## 2015-04-06 MED ORDER — EPINEPHRINE 0.3 MG/0.3ML IJ SOAJ
0.3000 mg | Freq: Once | INTRAMUSCULAR | Status: AC
  Administered 2015-04-06: 0.3 mg via INTRAMUSCULAR

## 2015-04-06 MED ORDER — EPINEPHRINE 0.15 MG/0.15ML IJ SOAJ: 0.1500 mg | INTRAMUSCULAR | Status: DC | PRN

## 2015-04-06 MED ORDER — EPINEPHRINE 0.3 MG/0.3ML IJ SOAJ
INTRAMUSCULAR | Status: AC
  Administered 2015-04-06: 0.3 mg via INTRAMUSCULAR
  Filled 2015-04-06: qty 0.3

## 2015-04-06 MED ORDER — METHYLPREDNISOLONE SODIUM SUCC 125 MG IJ SOLR
125.0000 mg | Freq: Once | INTRAMUSCULAR | Status: AC
  Administered 2015-04-06: 125 mg via INTRAVENOUS

## 2015-04-06 MED ORDER — FAMOTIDINE IN NACL 20-0.9 MG/50ML-% IV SOLN
20.0000 mg | Freq: Once | INTRAVENOUS | Status: AC
  Administered 2015-04-06: 20 mg via INTRAVENOUS
  Filled 2015-04-06: qty 50

## 2015-04-06 MED ORDER — PREDNISONE 10 MG PO TABS: 20.0000 mg | ORAL_TABLET | Freq: Every day | ORAL | Status: DC

## 2015-04-06 MED ORDER — SODIUM CHLORIDE 0.9 % IV BOLUS (SEPSIS)
1000.0000 mL | Freq: Once | INTRAVENOUS | Status: AC
  Administered 2015-04-06: 1000 mL via INTRAVENOUS

## 2015-04-06 MED ORDER — DIPHENHYDRAMINE HCL 50 MG/ML IJ SOLN
INTRAMUSCULAR | Status: AC
Start: 2015-04-06 — End: 2015-04-06
  Filled 2015-04-06: qty 1

## 2015-04-06 NOTE — ED Provider Notes (Signed)
CSN: [id number]     Arrival date & time 04/06/15  1352 History   First MD Initiated Contact with Patient 04/06/15 1410     Chief Complaint  Patient presents with  . Allergic Reaction     (Consider location/radiation/quality/duration/timing/severity/associated sxs/prior Treatment) HPI  81 year old male who presents with hives and difficulty breathing. History of CKD and asthma. Has been in usual state of health and out with wife to run errands today. States that he ate lunch without difficulty. Then had persimmon pudding that was bought from a church fundraiser and shortly afterwards developed itchiness all over his body with hives. Then developed difficulty breathing and chest tightness, and tried to take albuterol inhaler without good effect. Took 50 mg oral benadryl. Brought to ED by wife for re-evaluation. Denies tongue or throat swelling, chest pain, drooling or inability to handle saliva, hoarseness or voice changes, nausea/vomiting/diarrhea, or abdominal pain. Reports history of needing epipen in past with bee stings. No known food allergies. No new medication changes. No new exposures, detergents, lotions/creams.   Past Medical History  Diagnosis Date  . Melanoma in situ of right eyelid ([place])   . Allergy   . Asthma   . Gout   . Cough   . Snoring   . CKD (chronic kidney disease) stage 3, GFR 30-59 ml/min   . Hypertensive chronic kidney disease   . BPH (benign prostatic hypertrophy)    Past Surgical History  Procedure Laterality Date  . Eye surgery     Family History  Problem Relation Age of Onset  . Hypertension Father   . Lung cancer Father    Social History  Substance Use Topics  . Smoking status: Never Smoker   . Smokeless tobacco: Never Used  . Alcohol Use: 0.6 oz/week    1 Standard drinks or equivalent per week     Comment: occ    Review of Systems 10/14 systems reviewed and are negative other than those stated in the HPI    Allergies  Ivp dye;  Penicillins; and Codeine  Home Medications   Prior to Admission medications   Medication Sig Start Date End Date Taking? Authorizing Provider  albuterol (PROVENTIL) (2.5 MG/3ML) 0.083% nebulizer solution Take 2.5 mg by nebulization every 6 (six) hours as needed for wheezing or shortness of breath.    Historical Provider, MD  amLODipine (NORVASC) 10 MG tablet Take 10 mg by mouth daily.    Historical Provider, MD  budesonide-formoterol (SYMBICORT) 160-4.5 MCG/ACT inhaler Inhale 2 puffs into the lungs 2 (two) times daily.    Historical Provider, MD  butalbital-acetaminophen-caffeine (FIORICET WITH CODEINE) 50-325-40-30 MG per capsule Take 1 capsule by mouth every 4 (four) hours as needed for headache.    Historical Provider, MD  chlorpheniramine-HYDROcodone (TUSSIONEX PENNKINETIC ER) 10-8 MG/5ML SUER Take 5 mLs by mouth.    Historical Provider, MD  clobetasol cream (TEMOVATE) 0.05 %  03/03/14   Historical Provider, MD  diphenhydrAMINE (BENADRYL) 25 MG tablet Take 1 tablet (25 mg total) by mouth every 6 (six) hours as needed for itching or allergies. 04/06/15   Forde Dandy, MD  EPINEPHrine 0.15 MG/0.15ML IJ injection Inject 0.15 mLs (0.15 mg total) into the muscle as needed for anaphylaxis. 04/06/15   Forde Dandy, MD  famotidine (PEPCID) 20 MG tablet Take 1 tablet (20 mg total) by mouth 2 (two) times daily. 04/06/15   Forde Dandy, MD  fluocinonide gel (LIDEX) 0.05 %  12/26/11   Historical Provider, MD  fluticasone (FLONASE) 50 MCG/ACT nasal spray Place 2 sprays into both nostrils daily. 02/23/15   Historical Provider, MD  gabapentin (NEURONTIN) 300 MG capsule TAKE 1 CAPSULE NIGHTLY AND INCREASE TO 1 CAPSULE EVERY 8 HOURS AS TOLERATED 02/22/15   Historical Provider, MD  hydrOXYzine (ATARAX/VISTARIL) 25 MG tablet  08/02/11   Historical Provider, MD  ibuprofen (ADVIL,MOTRIN) 200 MG tablet Take 200 mg by mouth every 6 (six) hours as needed.    Historical Provider, MD  indomethacin (INDOCIN) 50 MG capsule  Take 50 mg by mouth 2 (two) times daily with a meal.    Historical Provider, MD  LORazepam (ATIVAN) 1 MG tablet Take 1 mg by mouth. 08/31/10   Historical Provider, MD  losartan (COZAAR) 50 MG tablet Take 50 mg by mouth daily.    Historical Provider, MD  losartan (COZAAR) 50 MG tablet Take 50 mg by mouth. 08/23/10   Historical Provider, MD  magnesium oxide (MAG-OX) 400 MG tablet Take 400 mg by mouth at bedtime.    Historical Provider, MD  oxyCODONE-acetaminophen (PERCOCET) 10-325 MG per tablet Take 1 tablet by mouth every 4 (four) hours as needed for pain.    Historical Provider, MD  predniSONE (DELTASONE) 10 MG tablet Take 4 tablets (40 mg total) by mouth daily. 04/07/15   Forde Dandy, MD  predniSONE (DELTASONE) 20 MG tablet daily as needed. 03/10/15   Historical Provider, MD  valACYclovir (VALTREX) 1000 MG tablet Take 1,000 mg by mouth daily.    Historical Provider, MD  zolpidem (AMBIEN) 10 MG tablet Take 10 mg by mouth at bedtime as needed. for sleep 02/27/15   Historical Provider, MD   BP 145/94 mmHg  Pulse 78  Temp(Src) 98.1 F (36.7 C) (Oral)  Resp 14  Ht 5' 8.5" (1.74 m)  Wt 166 lb (75.297 kg)  BMI 24.87 kg/m2  SpO2 96% Physical Exam Physical Exam  Nursing note and vitals reviewed. Constitutional: Elderly man in moderate respiratory distress, tachypnea with increased work of breathing, but able to speak in full sentences. Head: Normocephalic and atraumatic.  Mouth/Throat: Oropharynx is clear and moist.  Neck: Normal range of motion. Neck supple.  Cardiovascular: Tachycardic rate and regular rhythm.   Pulmonary/Chest: Effort normal and breath sounds diminished throughout. Tachypnea with increased work of breathing. Speaks in full sentences.  Abdominal: Soft. There is no tenderness. There is no rebound and no guarding.  Musculoskeletal: Normal range of motion.  Neurological: Alert, no facial droop, fluent speech, moves all extremities symmetrically Skin: Skin is warm and dry.   Psychiatric: Cooperative  ED Course  Procedures (including critical care time) Labs Review Labs Reviewed  CBC WITH DIFFERENTIAL/PLATELET - Abnormal; Notable for the following:    WBC 17.7 (*)    Neutro Abs 11.9 (*)    Lymphs Abs 4.7 (*)    Monocytes Absolute 1.1 (*)    All other components within normal limits  BASIC METABOLIC PANEL - Abnormal; Notable for the following:    Glucose, Bld 146 (*)    BUN 26 (*)    Creatinine, Ser 1.49 (*)    GFR calc non Af Amer 42 (*)    GFR calc Af Amer 49 (*)    All other components within normal limits    I have personally reviewed and evaluated these images and lab results as part of my medical decision-making.  CRITICAL CARE Performed by: Forde Dandy   Total critical care time: 30 minutes  Critical care time was exclusive of separately billable procedures  and treating other patients.  Critical care was necessary to treat or prevent imminent or life-threatening deterioration.  Critical care was time spent personally by me on the following activities: development of treatment plan with patient and/or surrogate as well as nursing, discussions with consultants, evaluation of patient's response to treatment, examination of patient, obtaining history from patient or surrogate, ordering and performing treatments and interventions, ordering and review of laboratory studies, ordering and review of radiographic studies, pulse oximetry and re-evaluation of patient's condition.  MDM   Final diagnoses:  Anaphylaxis, initial encounter    81 year old male who presents with SOB and hives after eating pudding. C/f anaphylaxis on arrival with hives in upper extremities and torso with increased work of breathing, near hypoxia to 91% on room air and diminished breath sounds. Given solumedrol, famotidine, and epipen with rapid reversal of symptoms. Had taken benadryl prior to arrival. Blood work with baseline kidney function, and overall unremarkable.  Observed in ED for several hours with no recurrence of symptoms, and patient reports feeling well. Discussed rare but possible rebound reaction and discussed how to use and when to use epipen if needed. Given prescriptions for epipen, prednisone, benadryl and famotidine. Will follow up closely with PCP. Discussed allergen avoidance until further evaluation by PCP and possible allergist. Felt stable and appropriate for discharge home.  Strict return and follow-up instructions reviewed. He expressed understanding of all discharge instructions and felt comfortable with the plan of care.     Forde Dandy, MD 04/06/15 2101

## 2015-04-06 NOTE — ED Notes (Addendum)
Sudden onset of itching and SOB after eating. His lips are tingling. He can speak in complete sentences and can swallow. He took Benadryl x 2 before coming here. He has developed hives under his arms while sitting at triage.

## 2015-04-06 NOTE — Discharge Instructions (Signed)
Return without fail for worsening symptoms, including difficulty breathing, severe chest pain, passing out, vomiting and unable to keep down food/fluids, repeat use of your epipen or any other symptoms concerning to you.   Anaphylactic Reaction An anaphylactic reaction is a sudden, severe allergic reaction. It affects the whole body. It can be life threatening. You may need to stay in the hospital.  [place] a medical bracelet or necklace that lists your allergy.  Carry your allergy kit or medicine shot to treat severe allergic reactions with you. These can save your life.  Do not drive until medicine from your shot has worn off, unless your doctor says it is okay.  If you have hives or a rash:  Take medicine as told by your doctor.  You may take over-the-counter antihistamine medicine.  Place cold cloths on your skin. Take baths in cool water. Avoid hot baths and hot showers. GET HELP RIGHT AWAY IF:   Your mouth is puffy (swollen), or you have trouble breathing.  You start making whistling sounds when you breathe (wheezing).  You have a tight feeling in your chest or throat.  You have a rash, hives, puffiness, or itching on your body.  You throw up (vomit) or have watery poop (diarrhea).  You feel dizzy or pass out (faint).  You think you are having an allergic reaction.  You have new symptoms. This is an emergency. Use your medicine shot or allergy kit as told. Call your local emergency services (911 in [place] Even if you feel better after the shot, you need to go to the hospital emergency department. MAKE SURE YOU:   Understand these instructions.  Will watch your condition.  Will get help right away if you are not doing well or get worse.   This information is not intended to replace advice given to you by your health care provider. Make sure you discuss any questions you have with your health care provider.   Document Released: 10/19/2007 Document Revised:  11/01/2011 Document Reviewed: 11/12/2014 Elsevier Interactive Patient Education [employer].

## 2015-04-06 NOTE — ED Notes (Signed)
Patient changed into a gown and placed on monitor.

## 2015-04-06 NOTE — ED Notes (Signed)
MD at bedside. 

## 2015-04-06 NOTE — ED Notes (Signed)
Pt and wife updated on plan of care and planned discharge for 6pm. Pt's wife is going to pick up pt prescriptions from pharmacy at this time

## 2015-04-06 NOTE — ED Notes (Signed)
Patient preparing for discharge. 

## 2015-04-07 ENCOUNTER — Telehealth: Payer: Self-pay

## 2015-04-07 DIAGNOSIS — G4733 Obstructive sleep apnea (adult) (pediatric): Secondary | ICD-10-CM

## 2015-04-07 NOTE — Telephone Encounter (Signed)
Spoke to pt and advised him of his sleep study results. I advised pt that his study revealed osa and hypopnea with hypoxema and treatment is advised. PAP therapy is indicated in REM accentuated apnea as diagnosed and Dr. Brett Fairy recommends proceeding with a titration study. Pt is willing to proceed with CPAP titration. His insurance changes at the beginning of next year, so he would like to get this study done ASAP. I will inform the sleep lab. Pt verbalized understanding.

## 2015-04-26 ENCOUNTER — Ambulatory Visit (INDEPENDENT_AMBULATORY_CARE_PROVIDER_SITE_OTHER): Payer: Medicare Other | Admitting: Neurology

## 2015-04-26 DIAGNOSIS — G4733 Obstructive sleep apnea (adult) (pediatric): Secondary | ICD-10-CM | POA: Diagnosis not present

## 2015-04-27 NOTE — Sleep Study (Signed)
Please see the scanned sleep study interpretation located in the procedure tab in the chart view section.  

## 2015-05-04 ENCOUNTER — Telehealth: Payer: Self-pay

## 2015-05-04 DIAGNOSIS — G4733 Obstructive sleep apnea (adult) (pediatric): Secondary | ICD-10-CM

## 2015-05-04 NOTE — Telephone Encounter (Signed)
Spoke to pt about his titration study. Advised pt that Dr. Brett Fairy recommends starting an auto CPAP with a long RAMP time between 5 and 10 cm H2O with 2 cm flex. Pt is willing to proceed. Pt asked that I not send the order to the DME until after the first of the year because his insurance will be switching to [facility]. I advised him that I would send it to [facility] after the first of the year. I advised pt to use his cpap at least four or more hours per night. A follow up appt was made for 3/27 at 8:30. Pt verbalized understanding.

## 2015-05-06 NOTE — Telephone Encounter (Signed)
Patient called back, has changed his mind about CPAP, would like to go ahead and proceed with this before the end of the year.

## 2015-05-06 NOTE — Telephone Encounter (Signed)
Spoke to pt's wife (per DPR). She is asking that I go ahead and send the order to [facility] They want to start cpap before the first of the year. Appt was changed to 07/29/15 at 9:30. Pt's wife verbalized understanding. Appt for 3/27 was cancelled.

## 2015-05-14 ENCOUNTER — Ambulatory Visit (HOSPITAL_COMMUNITY)
Admission: RE | Admit: 2015-05-14 | Discharge: 2015-05-14 | Disposition: A | Discharge status: Home / Self Care | Payer: Medicare Other | Source: Ambulatory Visit | Attending: Nurse Practitioner | Admitting: Nurse Practitioner

## 2015-05-14 VITALS — BP 132/90 | HR 90 | Ht 68.0 in | Wt 172.4 lb

## 2015-05-14 DIAGNOSIS — I4891 Unspecified atrial fibrillation: Secondary | ICD-10-CM | POA: Insufficient documentation

## 2015-05-14 MED ORDER — RIVAROXABAN 15 MG PO TABS: 15.0000 mg | ORAL_TABLET | Freq: Every day | ORAL | Status: DC

## 2015-05-14 NOTE — Patient Instructions (Addendum)
Your physician has recommended you make the following change in your medication:   1) Take 0.5 (25 mg) a tablet twice a day of Metoprolol 50 mg  2) Start Xarelto 15 mg daily at supper. 3) Follow up in one week after echo.

## 2015-05-15 ENCOUNTER — Encounter (HOSPITAL_COMMUNITY): Payer: Self-pay | Admitting: Nurse Practitioner

## 2015-05-15 NOTE — Progress Notes (Addendum)
Patient ID: Austin [name], male   DOB: 1930/11/03, 81 y.o.   MRN: [id number]     Primary Care Physician: Austin Lyons, MD Referring Physician: Dr. Reynaldo [name], [facility]   Austin [name] is a 81 y.o. 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 The pt reports that he has had an upper respiratory infection that he has been fighting for about the last month. When he was seen in his PCP's office Wednesday, an irregular heart beat was noted and EKG confirmed afib with cvr. Pt was unaware of the irregular heart rhythm. He has not noticed any change in his energy level or shortness of breath. His symptoms of URI include congested cough. He is on his last day of prednisone taper and has finished antibiotics. He was started on metoprolol 50 mg which he took one dose yesterday but not this am. He has a chadsvasc score of at least 3. Cr cl calculated at 41.9. He currently is on asa but importance of starting anticoagulant to diminish risk of stroke explained. He currently does not have any bleeding history or tendencies. Steady on his feet. He will be starting cpap after the first of the year. He rarely drinks alcohol, moderate caffeine use, no smoking. No regular exercise.   Today, he denies symptoms of palpitations, chest pain, shortness of breath, orthopnea, PND, lower extremity edema, dizziness, presyncope, syncope, or neurologic sequela. The patient is tolerating medications without difficulties and is otherwise without complaint today.   Past Medical History  Diagnosis Date  . Melanoma in situ of right eyelid ([place])   . Allergy   . Asthma   . Gout   . Cough   . Snoring   . CKD (chronic kidney disease) stage 3, GFR 30-59 ml/min   . Hypertensive chronic kidney disease   . BPH (benign prostatic hypertrophy)    Past Surgical History    Procedure Laterality Date  . Eye surgery      Current Outpatient Prescriptions  Medication Sig Dispense Refill  . albuterol (PROVENTIL) (2.5 MG/3ML) 0.083% nebulizer solution Take 2.5 mg by nebulization every 6 (six) hours as needed for wheezing or shortness of breath.    Marland Kitchen amLODipine (NORVASC) 10 MG tablet Take 10 mg by mouth daily.    . budesonide-formoterol (SYMBICORT) 160-4.5 MCG/ACT inhaler Inhale 2 puffs into the lungs 2 (two) times daily.    . butalbital-acetaminophen-caffeine (FIORICET WITH CODEINE) 50-325-40-30 MG per capsule Take 1 capsule by mouth every 4 (four) hours as needed for headache.    . chlorpheniramine-HYDROcodone (TUSSIONEX PENNKINETIC ER) 10-8 MG/5ML SUER Take 5 mLs by mouth.    . clobetasol cream (TEMOVATE) 0.05 %     . diphenhydrAMINE (BENADRYL) 25 MG tablet Take 1 tablet (25 mg total) by mouth every 6 (six) hours as needed for itching or allergies. 20 tablet 0  . EPINEPHrine 0.15 MG/0.15ML IJ injection Inject 0.15 mLs (0.15 mg total) into the muscle as needed for anaphylaxis. 2 Device 0  . famotidine (PEPCID) 20 MG tablet Take 1 tablet (20 mg total) by mouth 2 (two) times daily. 30 tablet 0  . fluocinonide gel (LIDEX) 0.05 %     . fluticasone (FLONASE) 50 MCG/ACT nasal spray Place 2 sprays into both nostrils daily.  8  . gabapentin (NEURONTIN) 300 MG capsule TAKE 1 CAPSULE NIGHTLY AND INCREASE TO  1 CAPSULE EVERY 8 HOURS AS TOLERATED  5  . hydrOXYzine (ATARAX/VISTARIL) 25 MG tablet     . ibuprofen (ADVIL,MOTRIN) 200 MG tablet Take 200 mg by mouth every 6 (six) hours as needed.    . indomethacin (INDOCIN) 50 MG capsule Take 50 mg by mouth 2 (two) times daily with a meal.    . LORazepam (ATIVAN) 1 MG tablet Take 1 mg by mouth.    . losartan (COZAAR) 50 MG tablet Take 50 mg by mouth daily.    Marland Kitchen losartan (COZAAR) 50 MG tablet Take 50 mg by mouth.    . magnesium oxide (MAG-OX) 400 MG tablet Take 400 mg by mouth at bedtime.    Marland Kitchen oxyCODONE-acetaminophen (PERCOCET) 10-325  MG per tablet Take 1 tablet by mouth every 4 (four) hours as needed for pain.    . predniSONE (DELTASONE) 10 MG tablet Take 4 tablets (40 mg total) by mouth daily. 20 tablet 0  . predniSONE (DELTASONE) 20 MG tablet daily as needed.  4  . valACYclovir (VALTREX) 1000 MG tablet Take 1,000 mg by mouth daily.    Marland Kitchen zolpidem (AMBIEN) 10 MG tablet Take 10 mg by mouth at bedtime as needed. for sleep  5  . Rivaroxaban (XARELTO) 15 MG TABS tablet Take 1 tablet (15 mg total) by mouth daily with supper. 42 tablet 0   No current facility-administered medications for this encounter.    Allergies  Allergen Reactions  . Ivp Dye [Iodinated Diagnostic Agents]   . Penicillins Swelling  . Codeine Rash    Social History   Social History  . Marital Status: Married    Spouse Name: [name]  . Number of Children: [name]  . Years of Education: [name]   Occupational History  . Not on file.   Social History Main Topics  . Smoking status: Never Smoker   . Smokeless tobacco: Never Used  . Alcohol Use: 0.6 oz/week    1 Standard drinks or equivalent per week     Comment: occ  . Drug Use: No  . Sexual Activity: Not on file   Other Topics Concern  . Not on file   Social History Narrative   Right Handed.  Caffiene  Coffee, tea 3 cups daily.   Pt retired.     Lives at home.      Family History  Problem Relation Age of Onset  . Hypertension Father   . Lung cancer Father     ROS- All systems are reviewed and negative except as per the HPI above  Physical Exam: Filed Vitals:   05/14/15 0946  BP: 132/90  Pulse: 90  Height: 5\' 8"  (1.727 m)  Weight: 172 lb 6.4 oz (78.2 kg)    GEN- The patient is well appearing, alert and oriented x 3 today.   Head- normocephalic, atraumatic Eyes-  Sclera clear, conjunctiva pink Ears- hearing intact Oropharynx- clear Neck- supple, no JVP Lymph- no cervical lymphadenopathy Lungs- Clear to ausculation bilaterally, normal work of breathing Heart- Irregular rate and  rhythm, no murmurs, rubs or gallops, PMI not laterally displaced GI- soft, NT, ND, + BS Extremities- no clubbing, cyanosis, or edema MS- no significant deformity or atrophy Skin- no rash or lesion Psych- euthymic mood, full affect Neuro- strength and sensation are intact  EKG- EKG's reviewed from PCP office, as well as an EKG in [facility] 2014, with LAD. Ekg today shows afib at 90 bpm, LAD, qrs int 82 ms, QTc 433 ms. Epic records reviewed Labs for PCP  office drawn Wednesday show a CBC remarkable for elevated white count(on steroids), cmet with creat at 1.5, bun 29, K+ 4.4 , liver enzymes with alt elevated at 54.  Assessment and Plan: 1. New onset afib, asymptomatic Possibly triggered by URI, prednisone Continue metoprolol, 50 mg bid  prescribed, but start at 1/2 tab bid, since basically rate controlled Check BP/HR over next week at home and contact office for any low readings Echo Start xarelto 15 mg at dinner meal wuth crcl calcuated at 41.9, bleeding precautions discussed  2. Lifestyle issues Carry thru with plans for cpap for sleep apnea Reduce caffeine intake Regular exercise  Try to obtain  5% weight loss  Will see back after echo obtained, within next 1-2 weeks Call for any issues before next scheduled visit  Austin [name], [facility] [address] [place], [place] [address] [phone]

## 2015-05-18 ENCOUNTER — Ambulatory Visit (HOSPITAL_COMMUNITY)
Admission: RE | Admit: 2015-05-18 | Discharge: 2015-05-18 | Disposition: A | Discharge status: Home / Self Care | Payer: Medicare PPO | Source: Ambulatory Visit | Attending: Nurse Practitioner | Admitting: Nurse Practitioner

## 2015-05-18 DIAGNOSIS — I351 Nonrheumatic aortic (valve) insufficiency: Secondary | ICD-10-CM | POA: Insufficient documentation

## 2015-05-18 DIAGNOSIS — I517 Cardiomegaly: Secondary | ICD-10-CM | POA: Insufficient documentation

## 2015-05-18 DIAGNOSIS — I4891 Unspecified atrial fibrillation: Secondary | ICD-10-CM | POA: Diagnosis not present

## 2015-05-18 DIAGNOSIS — I059 Rheumatic mitral valve disease, unspecified: Secondary | ICD-10-CM | POA: Diagnosis not present

## 2015-05-18 DIAGNOSIS — I358 Other nonrheumatic aortic valve disorders: Secondary | ICD-10-CM | POA: Diagnosis not present

## 2015-05-18 DIAGNOSIS — I340 Nonrheumatic mitral (valve) insufficiency: Secondary | ICD-10-CM | POA: Diagnosis not present

## 2015-05-18 DIAGNOSIS — I10 Essential (primary) hypertension: Secondary | ICD-10-CM | POA: Diagnosis not present

## 2015-05-18 DIAGNOSIS — I071 Rheumatic tricuspid insufficiency: Secondary | ICD-10-CM | POA: Insufficient documentation

## 2015-05-18 NOTE — Progress Notes (Signed)
  Echocardiogram 2D Echocardiogram has been performed.  Tresa Res 05/18/2015, 11:53 AM

## 2015-05-21 DIAGNOSIS — G4733 Obstructive sleep apnea (adult) (pediatric): Secondary | ICD-10-CM | POA: Diagnosis not present

## 2015-05-25 ENCOUNTER — Inpatient Hospital Stay (HOSPITAL_COMMUNITY)
Admission: RE | Admit: 2015-05-25 | Payer: BC Managed Care – PPO | Source: Ambulatory Visit | Admitting: Nurse Practitioner

## 2015-05-26 ENCOUNTER — Ambulatory Visit (HOSPITAL_COMMUNITY)
Admission: RE | Admit: 2015-05-26 | Discharge: 2015-05-26 | Disposition: A | Discharge status: Home / Self Care | Payer: Medicare PPO | Source: Ambulatory Visit | Attending: Nurse Practitioner | Admitting: Nurse Practitioner

## 2015-05-26 ENCOUNTER — Encounter (HOSPITAL_COMMUNITY): Payer: Self-pay | Admitting: Nurse Practitioner

## 2015-05-26 VITALS — BP 126/80 | HR 103 | Ht 68.0 in | Wt 165.2 lb

## 2015-05-26 DIAGNOSIS — I4891 Unspecified atrial fibrillation: Secondary | ICD-10-CM | POA: Diagnosis not present

## 2015-05-26 MED ORDER — DILTIAZEM HCL ER COATED BEADS 120 MG PO CP24: 120.0000 mg | ORAL_CAPSULE | Freq: Two times a day (BID) | ORAL | Status: DC

## 2015-05-26 NOTE — Progress Notes (Signed)
Patient ID: Austin [name], male   DOB: 1931/03/06, 82 y.o.   MRN: [id number]     Primary Care Physician: Geoffery Lyons, MD Referring Physician: Dr. Reynaldo Minium, [facility]   LEDARIUS [name] is a 82 y.o. 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 The pt reports that he has had an upper respiratory infection that he has been fighting for about the last month. When he was seen in his PCP's office Wednesday, an irregular heart beat was noted and EKG confirmed afib with cvr. Pt was unaware of the irregular heart rhythm. He has not noticed any change in his energy level or shortness of breath. His symptoms of URI include congested cough. He is on his last day of prednisone taper and has finished antibiotics. He was started on metoprolol which he took one dose yesterday but not this am. He has a chadsvasc score of at least 3. Cr cl calculated at 41.9. He currently is on asa but importance of starting anticoagulant to diminish risk of stroke explained. He currently does not have any bleeding history or tendencies. Steady on his feet. He will be starting cpap after the first of the year. He rarely drinks alcohol, moderate caffeine use, no smoking. No regular exercise.  He returns to the afib clinic today for f/u of above. He reports that he has become more symptomatic with exertional dyspnea and fatigue. NO PND, orthopnea or pedal edema. Weight is actually down 6-7 lbs from last weight, but he was on prednisone on that visit.  HR on EKG at 100 bpm, but when up walking it is poorly controlled at 130-140. He has been on xarelto since 12/29 and a cardioversion is planned as soon as he has been on xarelto  x 3 weeks.  He also explains that he has random fever up to 102 degrees and chills . This has been worked up by [employer] in the past, including  infectious disease MD and no one can explain this. He has some fever in the last week. Has started cpap which not going that well.  Echo showed normal LV function with LVH and sclerotic Aortic value but no stenosis.   Today, he denies symptoms of palpitations, chest pain, shortness of breath, orthopnea, PND, lower extremity edema, dizziness, presyncope, syncope, or neurologic sequela. The patient is tolerating medications without difficulties and is otherwise without complaint today.   Past Medical History  Diagnosis Date  . Melanoma in situ of right eyelid ([place])   . Allergy   . Asthma   . Gout   . Cough   . Snoring   . CKD (chronic kidney disease) stage 3, GFR 30-59 ml/min   . Hypertensive chronic kidney disease   . BPH (benign prostatic hypertrophy)    Past Surgical History  Procedure Laterality Date  . Eye surgery      Current Outpatient Prescriptions  Medication Sig Dispense Refill  . albuterol (PROVENTIL) (2.5 MG/3ML) 0.083% nebulizer solution Take 2.5 mg by nebulization every 6 (six) hours as needed for wheezing or shortness of breath.    . budesonide-formoterol (SYMBICORT) 160-4.5 MCG/ACT inhaler Inhale 2 puffs into the lungs 2 (two) times daily.    . butalbital-acetaminophen-caffeine (FIORICET WITH CODEINE) 50-325-40-30 MG per capsule Take 1 capsule by mouth every 4 (four) hours as needed for headache.    Marland Kitchen  chlorpheniramine-HYDROcodone (TUSSIONEX PENNKINETIC ER) 10-8 MG/5ML SUER Take 5 mLs by mouth.    . clobetasol cream (TEMOVATE) 0.05 %     . diphenhydrAMINE (BENADRYL) 25 MG tablet Take 1 tablet (25 mg total) by mouth every 6 (six) hours as needed for itching or allergies. 20 tablet 0  . EPINEPHrine 0.15 MG/0.15ML IJ injection Inject 0.15 mLs (0.15 mg total) into the muscle as needed for anaphylaxis. 2 Device 0  . fluocinonide gel (LIDEX) 0.05 %     . fluticasone (FLONASE) 50 MCG/ACT nasal spray Place 2 sprays into both nostrils daily.  8  . gabapentin (NEURONTIN) 300 MG  capsule TAKE 1 CAPSULE NIGHTLY AND INCREASE TO 1 CAPSULE EVERY 8 HOURS AS TOLERATED  5  . indomethacin (INDOCIN) 50 MG capsule Take 50 mg by mouth daily as needed.    Marland Kitchen LORazepam (ATIVAN) 1 MG tablet Take 1 mg by mouth.    . losartan (COZAAR) 50 MG tablet Take 50 mg by mouth daily.    Marland Kitchen losartan (COZAAR) 50 MG tablet Take 50 mg by mouth.    . magnesium oxide (MAG-OX) 400 MG tablet Take 400 mg by mouth at bedtime.    . Rivaroxaban (XARELTO) 15 MG TABS tablet Take 1 tablet (15 mg total) by mouth daily with supper. 42 tablet 0  . valACYclovir (VALTREX) 1000 MG tablet Take 1,000 mg by mouth daily.    Marland Kitchen zolpidem (AMBIEN) 10 MG tablet Take 10 mg by mouth at bedtime as needed. for sleep  5  . diltiazem (CARDIZEM CD) 120 MG 24 hr capsule Take 1 capsule (120 mg total) by mouth 2 (two) times daily. 60 capsule 3   No current facility-administered medications for this encounter.    Allergies  Allergen Reactions  . Ivp Dye [Iodinated Diagnostic Agents]   . Penicillins Swelling  . Codeine Rash    Social History   Social History  . Marital Status: Married    Spouse Name: [name]  . Number of Children: [name]  . Years of Education: [name]   Occupational History  . Not on file.   Social History Main Topics  . Smoking status: Never Smoker   . Smokeless tobacco: Never Used  . Alcohol Use: 0.6 oz/week    1 Standard drinks or equivalent per week     Comment: occ  . Drug Use: No  . Sexual Activity: Not on file   Other Topics Concern  . Not on file   Social History Narrative   Right Handed.  Caffiene  Coffee, tea 3 cups daily.   Pt retired.     Lives at home.      Family History  Problem Relation Age of Onset  . Hypertension Father   . Lung cancer Father     ROS- All systems are reviewed and negative except as per the HPI above  Physical Exam: Filed Vitals:   05/26/15 1002  BP: 126/80  Pulse: 103  Height: 5\' 8"  (1.727 m)  Weight: 165 lb 3.2 oz (74.934 kg)  SpO2: 91%    GEN- The  patient is well appearing, alert and oriented x 3 today.   Head- normocephalic, atraumatic Eyes-  Sclera clear, conjunctiva pink Ears- hearing intact Oropharynx- clear Neck- supple, no JVP Lymph- no cervical lymphadenopathy Lungs- Clear to ausculation bilaterally, normal work of breathing Heart- Irregular rate and rhythm, no murmurs, rubs or gallops, PMI not laterally displaced GI- soft, NT, ND, + BS Extremities- no clubbing, cyanosis, or edema MS- no significant deformity  or atrophy Skin- no rash or lesion Psych- euthymic mood, full affect Neuro- strength and sensation are intact  EKG- afib with rvr at 103 bpm, qrs int 76 ms, qtc 424 ms Epic records reviewed Labs for PCP office drawn Wednesday show a CBC remarkable for elevated white count(on steroids), cmet with creat at 1.5, bun 29, K+ 4.4 , liver enzymes with alt elevated at 54.  Assessment and Plan: 1. New onset afib, has become symptomatic Possibly triggered by URI, prednisone Has become more short of breath and fatigued, possibly by poor rate control plus not tolerating BB Stop bb, amlodipine and start cardizem 120 mg bid Watch HR/BP at home and report to office. Continue xarelto 15 mg at dinner meal with crcl calcuated at 41.9, bleeding precautions discussed Will bring back early next week and schedule for DCCV with being  on xarelto x 3 weeks, discussed DCCV with TEE if he felt like he needed cardioversion earlier based on symptoms but says he is ok to wait until next week..   2. Lifestyle issues Continue with cpap adjustment  Reduce caffeine intake Try to obtain  5% weight loss     Butch Penny C. Jerami Tammen, [facility] [address] [place], [place] [address] [phone]

## 2015-05-26 NOTE — Patient Instructions (Addendum)
Your physician has recommended you make the following change in your medication:  1)Stop metoprolol (lopressor) 2)Stop amlodipine (norvasc) 3)Start Cardizem CD 120mg  twice a day

## 2015-06-01 ENCOUNTER — Ambulatory Visit (HOSPITAL_COMMUNITY)
Admission: RE | Admit: 2015-06-01 | Discharge: 2015-06-01 | Disposition: A | Discharge status: Home / Self Care | Payer: Medicare PPO | Source: Ambulatory Visit | Attending: Nurse Practitioner | Admitting: Nurse Practitioner

## 2015-06-01 ENCOUNTER — Encounter (HOSPITAL_COMMUNITY): Payer: Self-pay | Admitting: Nurse Practitioner

## 2015-06-01 VITALS — BP 136/82 | HR 91 | Ht 68.0 in | Wt 168.0 lb

## 2015-06-01 DIAGNOSIS — I129 Hypertensive chronic kidney disease with stage 1 through stage 4 chronic kidney disease, or unspecified chronic kidney disease: Secondary | ICD-10-CM | POA: Insufficient documentation

## 2015-06-01 DIAGNOSIS — Z85820 Personal history of malignant melanoma of skin: Secondary | ICD-10-CM | POA: Diagnosis not present

## 2015-06-01 DIAGNOSIS — Z880 Allergy status to penicillin: Secondary | ICD-10-CM | POA: Diagnosis not present

## 2015-06-01 DIAGNOSIS — Z8249 Family history of ischemic heart disease and other diseases of the circulatory system: Secondary | ICD-10-CM | POA: Insufficient documentation

## 2015-06-01 DIAGNOSIS — Z7902 Long term (current) use of antithrombotics/antiplatelets: Secondary | ICD-10-CM | POA: Diagnosis not present

## 2015-06-01 DIAGNOSIS — I481 Persistent atrial fibrillation: Secondary | ICD-10-CM | POA: Diagnosis not present

## 2015-06-01 DIAGNOSIS — J45909 Unspecified asthma, uncomplicated: Secondary | ICD-10-CM | POA: Diagnosis not present

## 2015-06-01 DIAGNOSIS — J449 Chronic obstructive pulmonary disease, unspecified: Secondary | ICD-10-CM | POA: Insufficient documentation

## 2015-06-01 DIAGNOSIS — M109 Gout, unspecified: Secondary | ICD-10-CM | POA: Insufficient documentation

## 2015-06-01 DIAGNOSIS — Z885 Allergy status to narcotic agent status: Secondary | ICD-10-CM | POA: Insufficient documentation

## 2015-06-01 DIAGNOSIS — Z79899 Other long term (current) drug therapy: Secondary | ICD-10-CM | POA: Diagnosis not present

## 2015-06-01 DIAGNOSIS — I4819 Other persistent atrial fibrillation: Secondary | ICD-10-CM

## 2015-06-01 DIAGNOSIS — I4891 Unspecified atrial fibrillation: Secondary | ICD-10-CM | POA: Insufficient documentation

## 2015-06-01 DIAGNOSIS — N183 Chronic kidney disease, stage 3 (moderate): Secondary | ICD-10-CM | POA: Diagnosis not present

## 2015-06-01 LAB — CBC
HCT: 42.8 % (ref 39.0–52.0)
HEMOGLOBIN: 14.4 g/dL (ref 13.0–17.0)
MCH: 31.2 pg (ref 26.0–34.0)
MCHC: 33.6 g/dL (ref 30.0–36.0)
MCV: 92.8 fL (ref 78.0–100.0)
Platelets: 344 10*3/uL (ref 150–400)
RBC: 4.61 MIL/uL (ref 4.22–5.81)
RDW: 14.7 % (ref 11.5–15.5)
WBC: 8.5 10*3/uL (ref 4.0–10.5)

## 2015-06-01 LAB — BASIC METABOLIC PANEL
Anion gap: 10 (ref 5–15)
BUN: 13 mg/dL (ref 6–20)
CHLORIDE: 105 mmol/L (ref 101–111)
CO2: 27 mmol/L (ref 22–32)
CREATININE: 1.28 mg/dL — AB (ref 0.61–1.24)
Calcium: 8.6 mg/dL — ABNORMAL LOW (ref 8.9–10.3)
GFR calc non Af Amer: 50 mL/min — ABNORMAL LOW (ref >60)
GFR, EST AFRICAN AMERICAN: 58 mL/min — AB (ref >60)
Glucose, Bld: 91 mg/dL (ref 65–99)
POTASSIUM: 3.7 mmol/L (ref 3.5–5.1)
SODIUM: 142 mmol/L (ref 135–145)

## 2015-06-01 MED ORDER — RIVAROXABAN 15 MG PO TABS: 15.0000 mg | ORAL_TABLET | Freq: Every day | ORAL | Status: DC

## 2015-06-01 NOTE — Progress Notes (Signed)
Patient ID: Austin [name], male   DOB: March 11, 1931, 82 y.o.   MRN: [id number]     Primary Care Physician: Geoffery Lyons, MD Referring Physician: Dr. Reynaldo Minium, [facility]   Austin [name] is a 82 y.o. 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 The pt reports that he  had an upper respiratory infection that he had been fighting for about the last month. When he was seen in his PCP's office yesterday, an irregular heart beat was noted and EKG confirmed afib with cvr. Pt was unaware of the irregular heart rhythm. He has not noticed any change in his energy level or shortness of breath. His symptoms of URI include congested cough. He is on his last day of prednisone taper and has finished antibiotics. He was started on metoprolol which he took one dose yesterday but not this am. He has a chadsvasc score of at least 3. Cr cl calculated at 41.9. He currently is on asa but importance of starting anticoagulant to diminish risk of stroke explained. He currently does not have any bleeding history or tendencies. Steady on his feet. He will be starting cpap after the first of the year. He rarely drinks alcohol, moderate caffeine use, no smoking. No regular exercise.  He returns to the afib clinic 12/10 for f/u of above. He reports that he has become more symptomatic with exertional dyspnea and fatigue. NO PND, orthopnea or pedal edema. Weight is actually down 6-7 lbs from last weight, but he was on prednisone on that visit.  HR on EKG at 100 bpm, but when up walking it is poorly controlled at 130-140. He has been on xarelto since 12/29 and a cardioversion is planned as soon as he has been on xarelto  x 3 weeks.  He returns today to be set up for cardioversion. He will satisfy 21 days on blood thinner 1/18 and cardioversion is set up for Friday, 1/20. He  continues to be symptomatic with exertional dyspnea and fatigue. No missed doses of xarelto since staring 12/29. No bleeding concerns. Just ready to feel better with return of sinus rhythm.  He also explains that he has random fever up to 102 degrees and chills . This has been worked up by [employer] in the past, including infectious disease MD and no one can explain this. He has some fever in the last week. Has started cpap which is going better with switch to face mask.  Echo showed normal LV function with LVH and sclerotic Aortic value but no stenosis.   Today, he denies symptoms of palpitations, chest pain, shortness of breath, orthopnea, PND, lower extremity edema, dizziness, presyncope, syncope, or neurologic sequela. The patient is tolerating medications without difficulties and is otherwise without complaint today.   Past Medical History  Diagnosis Date  . Melanoma in situ of right eyelid ([place])   . Allergy   . Asthma   . Gout   . Cough   . Snoring   . CKD (chronic kidney disease) stage 3, GFR 30-59 ml/min   . Hypertensive chronic kidney disease   . BPH (benign prostatic hypertrophy)    Past Surgical History  Procedure Laterality Date  . Eye surgery      Current Outpatient Prescriptions  Medication Sig Dispense Refill  . albuterol (PROVENTIL) (2.5 MG/3ML) 0.083% nebulizer solution Take 2.5 mg by nebulization  every 6 (six) hours as needed for wheezing or shortness of breath.    . budesonide-formoterol (SYMBICORT) 160-4.5 MCG/ACT inhaler Inhale 2 puffs into the lungs 2 (two) times daily.    . butalbital-acetaminophen-caffeine (FIORICET WITH CODEINE) 50-325-40-30 MG per capsule Take 1 capsule by mouth every 4 (four) hours as needed for headache.    . chlorpheniramine-HYDROcodone (TUSSIONEX PENNKINETIC ER) 10-8 MG/5ML SUER Take 5 mLs by mouth.    . clobetasol cream (TEMOVATE) 0.05 %     . diltiazem (CARDIZEM CD) 120 MG 24 hr capsule Take 1 capsule (120 mg total) by mouth 2 (two)  times daily. 60 capsule 3  . diphenhydrAMINE (BENADRYL) 25 MG tablet Take 1 tablet (25 mg total) by mouth every 6 (six) hours as needed for itching or allergies. 20 tablet 0  . EPINEPHrine 0.15 MG/0.15ML IJ injection Inject 0.15 mLs (0.15 mg total) into the muscle as needed for anaphylaxis. 2 Device 0  . fluocinonide gel (LIDEX) 0.05 %     . fluticasone (FLONASE) 50 MCG/ACT nasal spray Place 2 sprays into both nostrils daily.  8  . gabapentin (NEURONTIN) 300 MG capsule TAKE 1 CAPSULE NIGHTLY AND INCREASE TO 1 CAPSULE EVERY 8 HOURS AS TOLERATED  5  . indomethacin (INDOCIN) 50 MG capsule Take 50 mg by mouth daily as needed.    Marland Kitchen LORazepam (ATIVAN) 1 MG tablet Take 1 mg by mouth.    . losartan (COZAAR) 50 MG tablet Take 50 mg by mouth daily.    Marland Kitchen losartan (COZAAR) 50 MG tablet Take 50 mg by mouth.    . magnesium oxide (MAG-OX) 400 MG tablet Take 400 mg by mouth at bedtime.    . Rivaroxaban (XARELTO) 15 MG TABS tablet Take 1 tablet (15 mg total) by mouth daily with supper. 90 tablet 3  . valACYclovir (VALTREX) 1000 MG tablet Take 1,000 mg by mouth daily.    Marland Kitchen zolpidem (AMBIEN) 10 MG tablet Take 10 mg by mouth at bedtime as needed. for sleep  5   No current facility-administered medications for this encounter.    Allergies  Allergen Reactions  . Ivp Dye [Iodinated Diagnostic Agents]   . Penicillins Swelling  . Codeine Rash    Social History   Social History  . Marital Status: Married    Spouse Name: [name]  . Number of Children: [name]  . Years of Education: [name]   Occupational History  . Not on file.   Social History Main Topics  . Smoking status: Never Smoker   . Smokeless tobacco: Never Used  . Alcohol Use: 0.6 oz/week    1 Standard drinks or equivalent per week     Comment: occ  . Drug Use: No  . Sexual Activity: Not on file   Other Topics Concern  . Not on file   Social History Narrative   Right Handed.  Caffiene  Coffee, tea 3 cups daily.   Pt retired.     Lives at home.       Family History  Problem Relation Age of Onset  . Hypertension Father   . Lung cancer Father     ROS- All systems are reviewed and negative except as per the HPI above  Physical Exam: Filed Vitals:   06/01/15 0906  BP: 136/82  Pulse: 91  Height: 5\' 8"  (1.727 m)  Weight: 168 lb (76.204 kg)    GEN- The patient is well appearing, alert and oriented x 3 today.   Head- normocephalic, atraumatic Eyes-  Sclera  clear, conjunctiva pink Ears- hearing intact Oropharynx- clear Neck- supple, no JVP Lymph- no cervical lymphadenopathy Lungs- Clear to ausculation bilaterally, normal work of breathing Heart- Irregular rate and rhythm, positive for  2/6 systolic murmur, rubs or gallops, PMI not laterally displaced GI- soft, NT, ND, + BS Extremities- no clubbing, cyanosis, or edema MS- no significant deformity or atrophy Skin- no rash or lesion Psych- euthymic mood, full affect Neuro- strength and sensation are intact  EKG- afib with 91 bpm,, qrs int 76 ms, qtc 424 ms Epic records reviewed Labs for PCP office drawn Wednesday 12/28, show a CBC remarkable for elevated white count(on steroids), cmet with creat at 1.5, bun 29, K+ 4.4 , liver enzymes with alt elevated at 54.  Assessment and Plan: 1. New onset symptomatic afib,   Possibly triggered by recent URI, prednisone Has become more short of breath and fatigued, possibly by poor rate control plus not tolerating BB Continue off bb, amlodipine and continue with cardizem 120 mg bid, now with better rate control Continue xarelto 15 mg at dinner meal with crcl calcuated at 41.9 to 48  ml/min) with improved renal function today of 1.28 ( improved from 1.49 mg/dl), bleeding precautions discussed DCCV scheduled for 1/20 with being now  on xarelto x 3 weeks( no missed doses)  2. Lifestyle issues Continue with cpap adjustment  Reduce caffeine intake Try to obtain  5% weight loss  F/u in one week s/p dccv   Butch Penny C. [place], [facility] [address] [place], [place] [address] [phone]

## 2015-06-01 NOTE — Patient Instructions (Signed)
Cardioversion scheduled for Friday, January 20th  - Arrive at the [facility] and go to admitting at [facility] not eat or drink anything after midnight the night prior to your procedure.  - Take all your medication with a sip of water prior to arrival.  - You will not be able to drive home after your procedure.

## 2015-06-02 ENCOUNTER — Other Ambulatory Visit (HOSPITAL_COMMUNITY): Payer: Self-pay | Admitting: *Deleted

## 2015-06-02 ENCOUNTER — Telehealth (HOSPITAL_COMMUNITY): Payer: Self-pay | Admitting: *Deleted

## 2015-06-02 MED ORDER — FUROSEMIDE 20 MG PO TABS: ORAL_TABLET | ORAL | Status: DC

## 2015-06-02 NOTE — Telephone Encounter (Signed)
Pt wife called in stating pts lower extremity edema has worsened and has now extended into his calves. Reports pt is short of breath all the time day and night but this symptom is not specifically new. He is able to lie down without being increasingly short of breath. Discussed with Roderic Palau NP will call in 3 day prescription of lasix with repeat ISTAT on Friday prior to cardioversion. Instructed to decrease Cardizem to 180mg  once a day after having the cardioversion. Wife verbalized understanding.

## 2015-06-05 ENCOUNTER — Ambulatory Visit (HOSPITAL_COMMUNITY): Payer: Medicare PPO | Admitting: Certified Registered Nurse Anesthetist

## 2015-06-05 ENCOUNTER — Telehealth: Payer: Self-pay | Admitting: Internal Medicine

## 2015-06-05 ENCOUNTER — Encounter (HOSPITAL_COMMUNITY): Payer: Self-pay

## 2015-06-05 ENCOUNTER — Ambulatory Visit (HOSPITAL_COMMUNITY)
Admission: RE | Admit: 2015-06-05 | Discharge: 2015-06-05 | Disposition: A | Discharge status: Home / Self Care | Payer: Medicare PPO | Source: Ambulatory Visit | Attending: Cardiovascular Disease | Admitting: Cardiovascular Disease

## 2015-06-05 ENCOUNTER — Encounter (HOSPITAL_COMMUNITY)
Admission: RE | Disposition: A | Discharge status: Home / Self Care | Payer: Self-pay | Source: Ambulatory Visit | Attending: Cardiovascular Disease

## 2015-06-05 DIAGNOSIS — Z7901 Long term (current) use of anticoagulants: Secondary | ICD-10-CM | POA: Insufficient documentation

## 2015-06-05 DIAGNOSIS — G4730 Sleep apnea, unspecified: Secondary | ICD-10-CM | POA: Diagnosis not present

## 2015-06-05 DIAGNOSIS — I129 Hypertensive chronic kidney disease with stage 1 through stage 4 chronic kidney disease, or unspecified chronic kidney disease: Secondary | ICD-10-CM | POA: Insufficient documentation

## 2015-06-05 DIAGNOSIS — I4819 Other persistent atrial fibrillation: Secondary | ICD-10-CM | POA: Insufficient documentation

## 2015-06-05 DIAGNOSIS — J45909 Unspecified asthma, uncomplicated: Secondary | ICD-10-CM | POA: Insufficient documentation

## 2015-06-05 DIAGNOSIS — Z85820 Personal history of malignant melanoma of skin: Secondary | ICD-10-CM | POA: Diagnosis not present

## 2015-06-05 DIAGNOSIS — I4891 Unspecified atrial fibrillation: Secondary | ICD-10-CM | POA: Insufficient documentation

## 2015-06-05 DIAGNOSIS — Z7951 Long term (current) use of inhaled steroids: Secondary | ICD-10-CM | POA: Insufficient documentation

## 2015-06-05 DIAGNOSIS — I481 Persistent atrial fibrillation: Secondary | ICD-10-CM

## 2015-06-05 DIAGNOSIS — Z79899 Other long term (current) drug therapy: Secondary | ICD-10-CM | POA: Diagnosis not present

## 2015-06-05 DIAGNOSIS — N183 Chronic kidney disease, stage 3 (moderate): Secondary | ICD-10-CM | POA: Insufficient documentation

## 2015-06-05 DIAGNOSIS — N189 Chronic kidney disease, unspecified: Secondary | ICD-10-CM | POA: Diagnosis not present

## 2015-06-05 DIAGNOSIS — J449 Chronic obstructive pulmonary disease, unspecified: Secondary | ICD-10-CM | POA: Insufficient documentation

## 2015-06-05 HISTORY — PX: CARDIOVERSION: SHX1299

## 2015-06-05 SURGERY — CARDIOVERSION
Anesthesia: Monitor Anesthesia Care

## 2015-06-05 MED ORDER — SODIUM CHLORIDE 0.9 % IV SOLN
INTRAVENOUS | Status: DC | PRN
  Administered 2015-06-05: 14:00:00 via INTRAVENOUS

## 2015-06-05 MED ORDER — PROPOFOL 10 MG/ML IV BOLUS
INTRAVENOUS | Status: DC | PRN
  Administered 2015-06-05: 50 mg via INTRAVENOUS

## 2015-06-05 MED ORDER — LIDOCAINE HCL (CARDIAC) 20 MG/ML IV SOLN
INTRAVENOUS | Status: DC | PRN
  Administered 2015-06-05: 40 mg via INTRAVENOUS

## 2015-06-05 NOTE — Transfer of Care (Signed)
Immediate Anesthesia Transfer of Care Note  Patient: Austin [name]  Procedure(s) Performed: Procedure(s): CARDIOVERSION ([name])  Patient Location: PACU and Endoscopy Unit  Anesthesia Type:General  Level of Consciousness: awake, alert , oriented, patient cooperative and responds to stimulation  Airway & Oxygen Therapy: Patient Spontanous Breathing and Patient connected to nasal cannula oxygen  Post-op Assessment: Report given to RN, Post -op Vital signs reviewed and stable and Patient moving all extremities X 4  Post vital signs: Reviewed and stable  Last Vitals:  Filed Vitals:   06/05/15 1408 06/05/15 1409  BP: 153/97   Pulse: 95 97  Resp: 20 19    Complications: No apparent anesthesia complications

## 2015-06-05 NOTE — Op Note (Signed)
Procedure: Electrical Cardioversion Indications:  Atrial Fibrillation  Procedure Details:  Consent: Risks of procedure as well as the alternatives and risks of each were explained to the (patient/caregiver).  Consent for procedure obtained.  Time Out: Verified patient identification, verified procedure, site/side was marked, verified correct patient position, special equipment/implants available, medications/allergies/relevent history reviewed, required imaging and test results available.  Performed  Patient placed on cardiac monitor, pulse oximetry, supplemental oxygen as necessary.  Sedation given: propofol 50 mg IV Pacer pads placed anterior and posterior chest.  Cardioverted 1 time(s).  Cardioversion with synchronized biphasic 120J shock.  Evaluation: Findings: Post procedure EKG shows: NSR Complications: None Patient did tolerate procedure well.  Time Spent Directly with the Patient:  30 minutes   Austin [name] 06/05/2015, 1:59 PM

## 2015-06-05 NOTE — H&P (View-Only) (Signed)
Patient ID: Austin [name], male   DOB: [birth year], 82 y.o.   MRN: [id number]     Primary Care Physician: Geoffery Lyons, MD Referring Physician: Dr. Reynaldo Minium, [facility]   Austin [name] is a 82 y.o. 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 The pt reports that he  had an upper respiratory infection that he had been fighting for about the last month. When he was seen in his PCP's office yesterday, an irregular heart beat was noted and EKG confirmed afib with cvr. Pt was unaware of the irregular heart rhythm. He has not noticed any change in his energy level or shortness of breath. His symptoms of URI include congested cough. He is on his last day of prednisone taper and has finished antibiotics. He was started on metoprolol which he took one dose yesterday but not this am. He has a chadsvasc score of at least 3. Cr cl calculated at 41.9. He currently is on asa but importance of starting anticoagulant to diminish risk of stroke explained. He currently does not have any bleeding history or tendencies. Steady on his feet. He will be starting cpap after the first of the year. He rarely drinks alcohol, moderate caffeine use, no smoking. No regular exercise.  He returns to the afib clinic 12/10 for f/u of above. He reports that he has become more symptomatic with exertional dyspnea and fatigue. NO PND, orthopnea or pedal edema. Weight is actually down 6-7 lbs from last weight, but he was on prednisone on that visit.  HR on EKG at 100 bpm, but when up walking it is poorly controlled at 130-140. He has been on xarelto since 12/29 and a cardioversion is planned as soon as he has been on xarelto  x 3 weeks.  He returns today to be set up for cardioversion. He will satisfy 21 days on blood thinner 1/18 and cardioversion is set up for Friday, 1/20. He  continues to be symptomatic with exertional dyspnea and fatigue. No missed doses of xarelto since staring 12/29. No bleeding concerns. Just ready to feel better with return of sinus rhythm.  He also explains that he has random fever up to 102 degrees and chills . This has been worked up by [employer] in the past, including infectious disease MD and no one can explain this. He has some fever in the last week. Has started cpap which is going better with switch to face mask.  Echo showed normal LV function with LVH and sclerotic Aortic value but no stenosis.   Today, he denies symptoms of palpitations, chest pain, shortness of breath, orthopnea, PND, lower extremity edema, dizziness, presyncope, syncope, or neurologic sequela. The patient is tolerating medications without difficulties and is otherwise without complaint today.   Past Medical History  Diagnosis Date  . Melanoma in situ of right eyelid ([place])   . Allergy   . Asthma   . Gout   . Cough   . Snoring   . CKD (chronic kidney disease) stage 3, GFR 30-59 ml/min   . Hypertensive chronic kidney disease   . BPH (benign prostatic hypertrophy)    Past Surgical History  Procedure Laterality Date  . Eye surgery      Current Outpatient Prescriptions  Medication Sig Dispense Refill  . albuterol (PROVENTIL) (2.5 MG/3ML) 0.083% nebulizer solution Take 2.5 mg by nebulization  every 6 (six) hours as needed for wheezing or shortness of breath.    . budesonide-formoterol (SYMBICORT) 160-4.5 MCG/ACT inhaler Inhale 2 puffs into the lungs 2 (two) times daily.    . butalbital-acetaminophen-caffeine (FIORICET WITH CODEINE) 50-325-40-30 MG per capsule Take 1 capsule by mouth every 4 (four) hours as needed for headache.    . chlorpheniramine-HYDROcodone (TUSSIONEX PENNKINETIC ER) 10-8 MG/5ML SUER Take 5 mLs by mouth.    . clobetasol cream (TEMOVATE) 0.05 %     . diltiazem (CARDIZEM CD) 120 MG 24 hr capsule Take 1 capsule (120 mg total) by mouth 2 (two)  times daily. 60 capsule 3  . diphenhydrAMINE (BENADRYL) 25 MG tablet Take 1 tablet (25 mg total) by mouth every 6 (six) hours as needed for itching or allergies. 20 tablet 0  . EPINEPHrine 0.15 MG/0.15ML IJ injection Inject 0.15 mLs (0.15 mg total) into the muscle as needed for anaphylaxis. 2 Device 0  . fluocinonide gel (LIDEX) 0.05 %     . fluticasone (FLONASE) 50 MCG/ACT nasal spray Place 2 sprays into both nostrils daily.  8  . gabapentin (NEURONTIN) 300 MG capsule TAKE 1 CAPSULE NIGHTLY AND INCREASE TO 1 CAPSULE EVERY 8 HOURS AS TOLERATED  5  . indomethacin (INDOCIN) 50 MG capsule Take 50 mg by mouth daily as needed.    Marland Kitchen LORazepam (ATIVAN) 1 MG tablet Take 1 mg by mouth.    . losartan (COZAAR) 50 MG tablet Take 50 mg by mouth daily.    Marland Kitchen losartan (COZAAR) 50 MG tablet Take 50 mg by mouth.    . magnesium oxide (MAG-OX) 400 MG tablet Take 400 mg by mouth at bedtime.    . Rivaroxaban (XARELTO) 15 MG TABS tablet Take 1 tablet (15 mg total) by mouth daily with supper. 90 tablet 3  . valACYclovir (VALTREX) 1000 MG tablet Take 1,000 mg by mouth daily.    Marland Kitchen zolpidem (AMBIEN) 10 MG tablet Take 10 mg by mouth at bedtime as needed. for sleep  5   No current facility-administered medications for this encounter.    Allergies  Allergen Reactions  . Ivp Dye [Iodinated Diagnostic Agents]   . Penicillins Swelling  . Codeine Rash    Social History   Social History  . Marital Status: Married    Spouse Name: [name]  . Number of Children: [name]  . Years of Education: [name]   Occupational History  . Not on file.   Social History Main Topics  . Smoking status: Never Smoker   . Smokeless tobacco: Never Used  . Alcohol Use: 0.6 oz/week    1 Standard drinks or equivalent per week     Comment: occ  . Drug Use: No  . Sexual Activity: Not on file   Other Topics Concern  . Not on file   Social History Narrative   Right Handed.  Caffiene  Coffee, tea 3 cups daily.   Pt retired.     Lives at home.       Family History  Problem Relation Age of Onset  . Hypertension Father   . Lung cancer Father     ROS- All systems are reviewed and negative except as per the HPI above  Physical Exam: Filed Vitals:   06/01/15 0906  BP: 136/82  Pulse: 91  Height: 5\' 8"  (1.727 m)  Weight: 168 lb (76.204 kg)    GEN- The patient is well appearing, alert and oriented x 3 today.   Head- normocephalic, atraumatic Eyes-  Sclera  clear, conjunctiva pink Ears- hearing intact Oropharynx- clear Neck- supple, no JVP Lymph- no cervical lymphadenopathy Lungs- Clear to ausculation bilaterally, normal work of breathing Heart- Irregular rate and rhythm, positive for  2/6 systolic murmur, rubs or gallops, PMI not laterally displaced GI- soft, NT, ND, + BS Extremities- no clubbing, cyanosis, or edema MS- no significant deformity or atrophy Skin- no rash or lesion Psych- euthymic mood, full affect Neuro- strength and sensation are intact  EKG- afib with 91 bpm,, qrs int 76 ms, qtc 424 ms Epic records reviewed Labs for PCP office drawn Wednesday 12/28, show a CBC remarkable for elevated white count(on steroids), cmet with creat at 1.5, bun 29, K+ 4.4 , liver enzymes with alt elevated at 54.  Assessment and Plan: 1. New onset symptomatic afib,   Possibly triggered by recent URI, prednisone Has become more short of breath and fatigued, possibly by poor rate control plus not tolerating BB Continue off bb, amlodipine and continue with cardizem 120 mg bid, now with better rate control Continue xarelto 15 mg at dinner meal with crcl calcuated at 41.9 to 48  ml/min) with improved renal function today of 1.28 ( improved from 1.49 mg/dl), bleeding precautions discussed DCCV scheduled for 1/20 with being now  on xarelto x 3 weeks( no missed doses)  2. Lifestyle issues Continue with cpap adjustment  Reduce caffeine intake Try to obtain  5% weight loss  F/u in one week s/p dccv   Butch Penny C. Tanish Prien, [facility] [address] [place], [place] [address] [phone]

## 2015-06-05 NOTE — Anesthesia Postprocedure Evaluation (Signed)
Anesthesia Post Note  Patient: Austin [name]  Procedure(s) Performed: Procedure(s) (LRB): CARDIOVERSION ([name])  Patient location during evaluation: Endoscopy Anesthesia Type: General Level of consciousness: awake, awake and alert, oriented and patient cooperative Vital Signs Assessment: post-procedure vital signs reviewed and stable Respiratory status: spontaneous breathing and respiratory function stable Cardiovascular status: blood pressure returned to baseline and stable Anesthetic complications: no    Last Vitals:  Filed Vitals:   06/05/15 1425 06/05/15 1430  BP: 163/94 160/108  Pulse: 73 95  Temp:    Resp: 17 22    Last Pain: There were no vitals filed for this visit.               Randa Riss EDWARD

## 2015-06-05 NOTE — Telephone Encounter (Signed)
Patient had a cardioversion today and his wife was called today. His HR is 101-102 and is irregular. The BP is 137/105. However this is obtained by an automated cuff in the presence of irregular HR and may be erroneous. Patient was told to follow up with Dr. Jackalyn Lombard clinic later on. He is on anticoagulation. He is currently asymptomatic. The wife was told that if patient develops any symptoms, he should come to the ED.

## 2015-06-05 NOTE — Discharge Instructions (Signed)
Electrical Cardioversion, Care After °Refer to this sheet in the next few weeks. These instructions provide you with information on caring for yourself after your procedure. Your health care provider may also give you more specific instructions. Your treatment has been planned according to current medical practices, but problems sometimes occur. Call your health care provider if you have any problems or questions after your procedure. °WHAT TO EXPECT AFTER THE PROCEDURE °After your procedure, it is typical to have the following sensations: °· Some redness on the skin where the shocks were delivered. If this is tender, a sunburn lotion or hydrocortisone cream may help. °· Possible return of an abnormal heart rhythm within hours or days after the procedure. °HOME CARE INSTRUCTIONS °· Take medicines only as directed by your health care provider. Be sure you understand how and when to take your medicine. °· Learn how to feel your pulse and check it often. °· Limit your activity for 48 hours after the procedure or as directed by your health care provider. °· Avoid or minimize caffeine and other stimulants as directed by your health care provider. °SEEK MEDICAL CARE IF: °· You feel like your heart is beating too fast or your pulse is not regular. °· You have any questions about your medicines. °· You have bleeding that will not stop. °SEEK IMMEDIATE MEDICAL CARE IF: °· You are dizzy or feel faint. °· It is hard to breathe or you feel short of breath. °· There is a change in discomfort in your chest. °· Your speech is slurred or you have trouble moving an arm or leg on one side of your body. °· You get a serious muscle cramp that does not go away. °· Your fingers or toes turn cold or blue. °  °This information is not intended to replace advice given to you by your health care provider. Make sure you discuss any questions you have with your health care provider. °  °Document Released: 02/20/2013 Document Revised: 05/23/2014  Document Reviewed: 02/20/2013 °Elsevier Interactive Patient Education ©2016 [employer] ° °

## 2015-06-05 NOTE — Interval H&P Note (Signed)
History and Physical Interval Note:  06/05/2015 1:13 PM  Austin [name]  has presented today for surgery, with the diagnosis of AFIB  The various methods of treatment have been discussed with the patient and family. After consideration of risks, benefits and other options for treatment, the patient has consented to  Procedure(s): CARDIOVERSION ([name]) as a surgical intervention .  The patient's history has been reviewed, patient examined, no change in status, stable for surgery.  I have reviewed the patient's chart and labs.  Questions were answered to the patient's satisfaction.     Enjoli Tidd

## 2015-06-05 NOTE — Anesthesia Preprocedure Evaluation (Signed)
Anesthesia Evaluation  Patient identified by MRN, date of birth, ID band Patient awake    Reviewed: Allergy & Precautions, NPO status , Patient's Chart, lab work & pertinent test results  Airway Mallampati: II  TM Distance: >3 FB     Dental   Pulmonary shortness of breath and at rest, asthma , COPD,    Pulmonary exam normal        Cardiovascular + dysrhythmias Atrial Fibrillation  Rhythm:Irregular Rate:Abnormal     Neuro/Psych    GI/Hepatic   Endo/Other    Renal/GU CRFRenal disease     Musculoskeletal   Abdominal   Peds  Hematology   Anesthesia Other Findings   Reproductive/Obstetrics                             Anesthesia Physical Anesthesia Plan  ASA: III  Anesthesia Plan: General   Post-op Pain Management:    Induction: Intravenous  Airway Management Planned: Mask  Additional Equipment:   Intra-op Plan:   Post-operative Plan:   Informed Consent: I have reviewed the patients History and Physical, chart, labs and discussed the procedure including the risks, benefits and alternatives for the proposed anesthesia with the patient or authorized representative who has indicated his/her understanding and acceptance.     Plan Discussed with: CRNA, Anesthesiologist and Surgeon  Anesthesia Plan Comments:         Anesthesia Quick Evaluation

## 2015-06-07 NOTE — Telephone Encounter (Signed)
Not a patient of mine.  Appears to follow with Dr Sallyanne Kuster and also with the Afib clinic

## 2015-06-08 ENCOUNTER — Ambulatory Visit (INDEPENDENT_AMBULATORY_CARE_PROVIDER_SITE_OTHER): Payer: Medicare PPO | Admitting: Cardiology

## 2015-06-08 ENCOUNTER — Encounter (HOSPITAL_COMMUNITY): Payer: Self-pay | Admitting: Cardiovascular Disease

## 2015-06-08 VITALS — BP 142/84 | HR 97 | Ht 68.5 in | Wt 165.4 lb

## 2015-06-08 DIAGNOSIS — I481 Persistent atrial fibrillation: Secondary | ICD-10-CM | POA: Diagnosis not present

## 2015-06-08 DIAGNOSIS — I4819 Other persistent atrial fibrillation: Secondary | ICD-10-CM

## 2015-06-08 MED ORDER — CARVEDILOL 6.25 MG PO TABS: 6.2500 mg | ORAL_TABLET | Freq: Two times a day (BID) | ORAL | Status: DC

## 2015-06-08 MED ORDER — FLECAINIDE ACETATE 50 MG PO TABS: 50.0000 mg | ORAL_TABLET | Freq: Two times a day (BID) | ORAL | Status: DC

## 2015-06-08 NOTE — Progress Notes (Signed)
Electrophysiology Office Note   Date:  06/08/2015   ID:  [name] Austin, DOB 1931-02-04, MRN [id number]  PCP:  Geoffery Lyons, MD  Primary Electrophysiologist:  Constance Haw, MD    Chief Complaint  Patient presents with  . Atrial Fibrillation     History of Present Illness: Austin [name] is a 82 y.o. male who presents today for electrophysiology evaluation.   He has a h/o melanoma of rt eyelid with subsequent loss of vision in rt eye, COPD, CKD, recently diagnosed sleep apnea,evaluated in the afib clinic, 03/17/28, for new onset afib. He was seen in his PCP's office; an irregular heart beat was noted and EKG confirmed afib with RVR. Pt was unaware of the irregular heart rhythm. He has not noticed any change in his energy level or shortness of breath. His symptoms of URI include congested cough. Cr cl calculated at 41.9.   He presented to the afib clinic 12/10 for f/u. He was having more symptoms with exertional dyspnea and fatigue.  He had cardioversion, 1/20.  He went back into atrial fibrillation quickly after cardioversion.  Echo showed normal LV function with LVH and sclerotic aortic value but no stenosis.  Today, he denies symptoms of palpitations, chest pain,  dizziness, presyncope, syncope, or neurologic sequela. The patient is tolerating medications without difficulties and is otherwise without complaint today. He has been having shortness of breath for the last 3 weeks.  He is unable to do his daily activities such as walking on the beach and yard work.  He is sleeping on 1 pillow at night and does not wake up SOB.  He has also had LE edema and was taking lasix for 3 days which had an improvement in his symptoms.  He continues to wear support stockings.  Past Medical History  Diagnosis Date  . Melanoma in situ of right eyelid ([place])   . Allergy   . Asthma   . Gout   . Cough   . Snoring   . CKD (chronic kidney disease) stage 3, GFR 30-59 ml/min   .  Hypertensive chronic kidney disease   . BPH (benign prostatic hypertrophy)    Past Surgical History  Procedure Laterality Date  . Eye surgery    . Cardioversion [name] 06/05/2015    Procedure: CARDIOVERSION;  Surgeon: Sanda Klein, MD;  Location: [facility];  Service: Cardiovascular;  Laterality: [name];     Current Outpatient Prescriptions  Medication Sig Dispense Refill  . acetaminophen (TYLENOL) 500 MG tablet Take 1,000 mg by mouth daily as needed for mild pain or headache.    . albuterol (PROVENTIL) (2.5 MG/3ML) 0.083% nebulizer solution Take 2.5 mg by nebulization every 6 (six) hours as needed for wheezing or shortness of breath.    . benzonatate (TESSALON) 200 MG capsule TAKE 1 CAPSULE at bedtime as needed for cough  1  . budesonide-formoterol (SYMBICORT) 160-4.5 MCG/ACT inhaler Inhale 2 puffs into the lungs 2 (two) times daily.    . butalbital-acetaminophen-caffeine (FIORICET WITH CODEINE) 50-325-40-30 MG per capsule Take 1 capsule by mouth every 4 (four) hours as needed for headache.    . clobetasol cream (TEMOVATE) [id number] % Apply 1 application topically 2 (two) times daily as needed (for itching).     Marland Kitchen diltiazem (CARDIZEM CD) 120 MG 24 hr capsule Take 1 capsule (120 mg total) by mouth 2 (two) times daily. 60 capsule 3  . diphenhydrAMINE (BENADRYL) 25 MG tablet Take 1 tablet (25 mg total) by mouth every 6 (  six) hours as needed for itching or allergies. 20 tablet 0  . EPINEPHrine 0.15 MG/0.15ML IJ injection Inject 0.15 mLs (0.15 mg total) into the muscle as needed for anaphylaxis. 2 Device 0  . fluocinonide gel (LIDEX) [id number] % Apply 1 application topically daily as needed.     . fluorouracil (EFUDEX) 5 % cream Apply 1 application topically daily as needed.   0  . fluticasone (FLONASE) 50 MCG/ACT nasal spray Place 2 sprays into both nostrils at bedtime.   8  . gabapentin (NEURONTIN) 300 MG capsule TAKE 1 CAPSULE NIGHTLY AND INCREASE TO 1 CAPSULE EVERY 8 HOURS AS TOLERATED for itching  5  .  indomethacin (INDOCIN) 50 MG capsule Take 50 mg by mouth daily as needed (for gout flare up).     . LORazepam (ATIVAN) 1 MG tablet Take 1 mg by mouth at bedtime as needed for sleep. Reported on 06/02/2015    . losartan (COZAAR) 50 MG tablet Take 50 mg by mouth daily.    . NON FORMULARY     . Rivaroxaban (XARELTO) 15 MG TABS tablet Take 1 tablet (15 mg total) by mouth daily with supper. 90 tablet 3  . SYMBICORT 80-4.5 MCG/ACT inhaler Inhale 2 puffs into the lungs 2 (two) times daily.    . valACYclovir (VALTREX) 1000 MG tablet Take 1,000 mg by mouth daily.    Dema Severin Petrolatum-Mineral Oil (SYSTANE NIGHTTIME OP) Place 1 drop into the right eye 3 (three) times daily as needed (for dry eyes).     Marland Kitchen zolpidem (AMBIEN) 10 MG tablet Take 10 mg by mouth at bedtime as needed for sleep. for sleep  5   No current facility-administered medications for this visit.    Allergies:   Diltiazem; Codeine; Ivp dye; Penicillins; and Tape   Social History:  The patient  reports that he has never smoked. He has never used smokeless tobacco. He reports that he drinks about 0.6 oz of alcohol per week. He reports that he does not use illicit drugs.   Family History:  The patient's family history includes Hypertension in his father; Lung cancer in his father.    ROS:  Please see the history of present illness.   Otherwise, review of systems is positive for chills, fatigue, leg swelling, palpitations, cough, DOE, wheezing, anxiety, balance problems, headaches, easy bruising.   All other systems are reviewed and negative.    PHYSICAL EXAM: VS:  BP 142/84 mmHg  Pulse 97  Ht 5' 8.5" (1.74 m)  Wt 165 lb 6.4 oz (75.025 kg)  BMI 24.78 kg/m2 , BMI Body mass index is 24.78 kg/(m^2). GEN: Well nourished, well developed, in no acute distress HEENT: normal Neck: no JVD, carotid bruits, or masses Cardiac: irregular rhythm; no murmurs, rubs, or gallops,no edema  Respiratory:  clear to auscultation bilaterally, normal work of  breathing GI: soft, nontender, nondistended, + BS MS: no deformity or atrophy Skin: warm and dry Neuro:  Strength and sensation are intact Psych: euthymic mood, full affect  EKG:  EKG is ordered today. The ekg ordered today shows sinus rhythm, APCs, rate 97  Recent Labs: 06/01/2015: BUN 13; Creatinine, Ser 1.28*; Hemoglobin 14.4; Platelets 344; Potassium 3.7; Sodium 142    Lipid Panel  No results found for: CHOL, TRIG, HDL, CHOLHDL, VLDL, LDLCALC, LDLDIRECT   Wt Readings from Last 3 Encounters:  06/08/15 165 lb 6.4 oz (75.025 kg)  06/05/15 168 lb (76.204 kg)  06/01/15 168 lb (76.204 kg)      Other  studies Reviewed: Additional studies/ records that were reviewed today include: TTE 05/18/15  Review of the above records today demonstrates:  - LVEF 60-65%, moderate LVH, normal wall motion , calcified aortic valve with mild regurgitation, upper normal LA size, mild TR, RVSP 37 mmHg, lipomatous hypertrophy of the IAS, normal IVC.   ASSESSMENT AND PLAN:  1. Persistent atrial fibrillation: underwent cardioversion last Friday but continues to have symptoms of shortness of breath.  Was not tolerating metoprolol, but diltiazem was giving him headaches.  Adasia Hoar start coreg today for possible improved BP and rate control.  Elmyra Banwart also start flecainide 50 mg to hopefully decrease his APCs.  He is on Xarelto for anticoagulation and a CHADS2VASc of 3.  This patients CHA2DS2-VASc Score and unadjusted Ischemic Stroke Rate (% per year) is equal to 3.2 % stroke rate/year from a score of 3  Above score calculated as 1 point each if present [CHF, HTN, DM, Vascular=MI/PAD/Aortic Plaque, Age if 65-74, or Male] Above score calculated as 2 points each if present [Age > 75, or Stroke/TIA/TE]  2. Lifestyle issues Continue with cpap adjustment  Reduce caffeine intake Try to obtain 5% weight loss  Current medicines are reviewed at length with the patient today.   The patient has concerns  regarding his medicines.  The following changes were made today:  Flecainide added, switch cardiazem to coreg  Labs/ tests ordered today include: stress test  No orders of the defined types were placed in this encounter.     Disposition:   FU with Tyrea Froberg 3 months  Signed, Zeth Buday Meredith Leeds, MD  06/08/2015 11:50 AM     [facility] [address] [place] [address] [phone] (office) [phone] (fax)

## 2015-06-08 NOTE — Patient Instructions (Addendum)
Medication Instructions:  Your physician has recommended you make the following change in your medication:  1) STOP Diltiazem 2) START Carvedilol 6.25 mg twice a day 3) START Flecainide 50 mg twice a day  -  *start this medication 7-10 days prior to stress testing*  Labwork: None ordered  Testing/Procedures: Your physician has requested that you have an exercise tolerance test. For further information please visit [email or web address]. Please also follow instruction sheet, as given.  Follow-Up: Your physician recommends that you schedule a follow-up appointment in: 3 months with Dr. Curt Bears.  If you need a refill on your cardiac medications before your next appointment, please call your pharmacy.  Thank you for choosing [facility]!!   Trinidad Curet, RN [phone]

## 2015-06-09 DIAGNOSIS — D0462 Carcinoma in situ of skin of left upper limb, including shoulder: Secondary | ICD-10-CM | POA: Diagnosis not present

## 2015-06-09 DIAGNOSIS — L738 Other specified follicular disorders: Secondary | ICD-10-CM | POA: Diagnosis not present

## 2015-06-09 DIAGNOSIS — L821 Other seborrheic keratosis: Secondary | ICD-10-CM | POA: Diagnosis not present

## 2015-06-09 DIAGNOSIS — C44722 Squamous cell carcinoma of skin of right lower limb, including hip: Secondary | ICD-10-CM | POA: Diagnosis not present

## 2015-06-09 DIAGNOSIS — Z85828 Personal history of other malignant neoplasm of skin: Secondary | ICD-10-CM | POA: Diagnosis not present

## 2015-06-09 DIAGNOSIS — D225 Melanocytic nevi of trunk: Secondary | ICD-10-CM | POA: Diagnosis not present

## 2015-06-09 DIAGNOSIS — L570 Actinic keratosis: Secondary | ICD-10-CM | POA: Diagnosis not present

## 2015-06-11 ENCOUNTER — Telehealth (HOSPITAL_COMMUNITY): Payer: Self-pay

## 2015-06-11 NOTE — Telephone Encounter (Signed)
Encounter complete. 

## 2015-06-12 ENCOUNTER — Ambulatory Visit (HOSPITAL_COMMUNITY): Payer: Medicare PPO | Admitting: Nurse Practitioner

## 2015-06-16 ENCOUNTER — Telehealth: Payer: Self-pay | Admitting: Cardiology

## 2015-06-16 ENCOUNTER — Ambulatory Visit (HOSPITAL_COMMUNITY)
Admission: RE | Admit: 2015-06-16 | Discharge: 2015-06-16 | Disposition: A | Discharge status: Home / Self Care | Payer: Medicare PPO | Source: Ambulatory Visit | Attending: Cardiology | Admitting: Cardiology

## 2015-06-16 ENCOUNTER — Encounter (HOSPITAL_COMMUNITY): Payer: Self-pay | Admitting: *Deleted

## 2015-06-16 DIAGNOSIS — I4819 Other persistent atrial fibrillation: Secondary | ICD-10-CM

## 2015-06-16 NOTE — Telephone Encounter (Signed)
Spoke with Austin [name]. She reports blood pressure was elevated today and stress test had to be rescheduled.  Blood pressures listed in documentation note from today.  Austin [name] is currently on the way home and has not rechecked blood pressure.  Wife reports Austin [name] also has swelling in feet and legs to knees.  Swelling started yesterday.  Was on lasix for 3 days in past that was prescribed by atrial fib clinic.  Has had no swelling since taking lasix.  Continues to have shortness of breath but this has not worsened since office visit with Dr. Curt Bears.  Will review with provider in office.

## 2015-06-16 NOTE — Telephone Encounter (Signed)
Reviewed with Richardson Dopp, PA. Pt should take lasix 20 mg by mouth daily for 3 days and have office visit with him next week.  Continue to monitor blood pressure and let us know if elevated. I spoke with pt's wife and gave her this information. Appt made for pt to see Richardson Dopp, PA on 06/22/15 at 8:50.  She does not need new prescription.

## 2015-06-16 NOTE — Telephone Encounter (Signed)
Pt went to have stress test today,they could not do it. Blood Pressure was so high 125/105 to 112. He also fluid in his knee on down to his feet. Please call asap to advise.l

## 2015-06-16 NOTE — Progress Notes (Unsigned)
Patient ID: Austin [name], male   DOB: March 31, 1931, 82 y.o.   MRN: [id number] [name] was unable to do the ETT due to diastolic BP; 1.  99991111, 2.  164/108, 3.  150/109 and at 16 min. Of resting; 150/106.   He has rescheduled for 07/01/2015 at 9:45 am.

## 2015-06-17 ENCOUNTER — Ambulatory Visit: Payer: Medicare PPO | Admitting: Cardiovascular Disease

## 2015-06-21 DIAGNOSIS — G4733 Obstructive sleep apnea (adult) (pediatric): Secondary | ICD-10-CM | POA: Diagnosis not present

## 2015-06-21 NOTE — Progress Notes (Signed)
Cardiology Office Note:    Date:  06/22/2015   ID:  Austin [name], DOB October 28, 1930, MRN [id number]  PCP:  Geoffery Lyons, MD  Cardiologist/Electrophysiologist:  Dr. Allegra Lai   Chief Complaint  Patient presents with  . Elevated BP, Edema     History of Present Illness:     Austin [name] is a 82 y.o. male with a hx of melanoma on the right eyelid with subsequent loss of vision in the right eye, COPD, CKD, OSA, persistent atrial fibrillation. Initially evaluated in 11/16 for new onset AF with RVR.   He underwent DCCV in 1/17 but reverted back to atrial fibrillation. He has been followed in the atrial fibrillation clinic. Last seen by Dr. Curt Bears 06/08/15.  His beta-blocker was changed to Carvedilol.  Flecainide was started.  Of note, he was in NSR on 1/23.  Patient presented for ETT last week but this was cancelled due to uncontrolled HTN.  Patient called our office and also noted LE edema.  Lasix was given for 3 days and FU arranged today.    Here with his wife. He complains of DOE since 04/2015. Breathing is somewhat better after taking 3 days of Lasix. He was having a hard time getting out to his mailbox due to dyspnea.  Now, he is going out to his mailbox without any issues.   He is NYHA 2b.  Denies orthopnea, PND.  LE edema is fairly stable.  Denies chest pain, syncope.  He has a chronic cough with clear sputum.  No fever.  Weight is up 5 lbs by his scale at home.  Denies any bleeding. No fever.  His BP at home has steadily improved since he had his ETT canceled.    Past Medical History  Diagnosis Date  . Melanoma in situ of right eyelid ([place])   . Allergy   . Asthma   . Gout   . Cough   . OSA (obstructive sleep apnea)   . CKD (chronic kidney disease) stage 3, GFR 30-59 ml/min   . Hypertensive chronic kidney disease   . BPH (benign prostatic hypertrophy)   . Persistent atrial fibrillation [facility])     Past Surgical History  Procedure Laterality Date  . Eye surgery      . Cardioversion [name] 06/05/2015    Procedure: CARDIOVERSION;  Surgeon: Sanda Klein, MD;  Location: [facility];  Service: Cardiovascular;  Laterality: [name];    Current Medications: Outpatient Prescriptions Prior to Visit  Medication Sig Dispense Refill  . acetaminophen (TYLENOL) 500 MG tablet Take 1,000 mg by mouth daily as needed for mild pain or headache.    . albuterol (PROVENTIL) (2.5 MG/3ML) 0.083% nebulizer solution Take 2.5 mg by nebulization every 6 (six) hours as needed for wheezing or shortness of breath.    . benzonatate (TESSALON) 200 MG capsule TAKE 1 CAPSULE at bedtime as needed for cough  1  . budesonide-formoterol (SYMBICORT) 160-4.5 MCG/ACT inhaler Inhale 2 puffs into the lungs 2 (two) times daily.    . butalbital-acetaminophen-caffeine (FIORICET WITH CODEINE) 50-325-40-30 MG per capsule Take 1 capsule by mouth every 4 (four) hours as needed for headache.    . carvedilol (COREG) 6.25 MG tablet Take 1 tablet (6.25 mg total) by mouth 2 (two) times daily. 60 tablet 3  . clobetasol cream (TEMOVATE) AB-123456789 % Apply 1 application topically 2 (two) times daily as needed (for itching).     . diphenhydrAMINE (BENADRYL) 25 MG tablet Take 1 tablet (25 mg total) by  mouth every 6 (six) hours as needed for itching or allergies. 20 tablet 0  . EPINEPHrine 0.15 MG/0.15ML IJ injection Inject 0.15 mLs (0.15 mg total) into the muscle as needed for anaphylaxis. 2 Device 0  . flecainide (TAMBOCOR) 50 MG tablet Take 1 tablet (50 mg total) by mouth 2 (two) times daily. 60 tablet 3  . fluocinonide gel (LIDEX) AB-123456789 % Apply 1 application topically daily as needed (FOR ITCHING).     . fluorouracil (EFUDEX) 5 % cream Apply 1 application topically daily as needed (FOR ITCHING).   0  . fluticasone (FLONASE) 50 MCG/ACT nasal spray Place 2 sprays into both nostrils at bedtime.   8  . gabapentin (NEURONTIN) 300 MG capsule TAKE 1 CAPSULE NIGHTLY AND INCREASE TO 1 CAPSULE EVERY 8 HOURS AS TOLERATED for itching  5   . indomethacin (INDOCIN) 50 MG capsule Take 50 mg by mouth daily as needed (for gout flare up).     . LORazepam (ATIVAN) 1 MG tablet Take 1 mg by mouth at bedtime as needed for sleep. Reported on 06/02/2015    . losartan (COZAAR) 50 MG tablet Take 50 mg by mouth daily.    . Rivaroxaban (XARELTO) 15 MG TABS tablet Take 1 tablet (15 mg total) by mouth daily with supper. 90 tablet 3  . SYMBICORT 80-4.5 MCG/ACT inhaler Inhale 2 puffs into the lungs 2 (two) times daily.    . valACYclovir (VALTREX) 1000 MG tablet Take 1,000 mg by mouth daily.    Dema Severin Petrolatum-Mineral Oil (SYSTANE NIGHTTIME OP) Place 1 drop into the right eye 3 (three) times daily as needed (for dry eyes).     Marland Kitchen zolpidem (AMBIEN) 10 MG tablet Take 10 mg by mouth at bedtime as needed for sleep. for sleep  5  . NON FORMULARY Reported on 06/22/2015     No facility-administered medications prior to visit.     Allergies:   Diltiazem; Codeine; Ivp dye; Penicillins; and Tape   Social History   Social History  . Marital Status: Married    Spouse Name: [name]  . Number of Children: [name]  . Years of Education: [name]   Social History Main Topics  . Smoking status: Never Smoker   . Smokeless tobacco: Never Used  . Alcohol Use: 0.6 oz/week    1 Standard drinks or equivalent per week     Comment: occ  . Drug Use: No  . Sexual Activity: Not Asked   Other Topics Concern  . None   Social History Narrative   Right Handed.  Caffiene  Coffee, tea 3 cups daily.   Pt retired.     Lives at home.       Family History:  The patient's family history includes Hypertension in his father; Lung cancer in his father.   ROS:   Please see the history of present illness.    Review of Systems  Constitution: Positive for malaise/fatigue.  HENT: Positive for headaches.   Cardiovascular: Positive for dyspnea on exertion, irregular heartbeat and leg swelling.  Respiratory: Positive for cough, shortness of breath, snoring and wheezing.    Hematologic/Lymphatic: Bruises/bleeds easily.  Musculoskeletal: Positive for joint pain.  Neurological: Positive for dizziness and loss of balance.  Psychiatric/Behavioral: The patient is nervous/anxious.   All other systems reviewed and are negative.   Physical Exam:    VS:  BP 142/80 mmHg  Pulse 70  Ht 5' 8.5" (1.74 m)  Wt 170 lb 12.8 oz (77.474 kg)  BMI 25.59 kg/m2  GEN: Well nourished, well developed, in no acute distress HEENT: normal Neck: no JVD, no masses Cardiac: Normal S1/S2, RRR; no murmurs, no edema    Respiratory:  Coarse breath sounds at the bases bilaterally; no wheezing  GI: soft, nontender  MS: no deformity or atrophy Skin: warm and dry, no rash Neuro:  no focal deficits  Psych: Alert and oriented x 3, normal affect  Wt Readings from Last 3 Encounters:  06/22/15 170 lb 12.8 oz (77.474 kg)  06/08/15 165 lb 6.4 oz (75.025 kg)  06/05/15 168 lb (76.204 kg)      Studies/Labs Reviewed:     EKG:  EKG is  ordered today.  The ekg ordered today demonstrates NSR, HR 69, frequent PACs, QTc 484 ms, no change from prior tracing  Recent Labs: 06/01/2015: BUN 13; Creatinine, Ser 1.28*; Hemoglobin 14.4; Platelets 344; Potassium 3.7; Sodium 142   Recent Lipid Panel No results found for: CHOL, TRIG, HDL, CHOLHDL, VLDL, LDLCALC, LDLDIRECT  Additional studies/ records that were reviewed today include:   Echo 05/18/15 Mod LVH, EF 60-65%, no RWMA, mild AI, MAC, trivial MR, atrial septal lipomatous hypertrophy, mild TR, PASP 37 mmHg  Carotid US 10/15 Bilat ICA < 40%   ASSESSMENT:     1. Chronic diastolic CHF (congestive heart failure) ([place])   2. Essential hypertension   3. Persistent atrial fibrillation ([place])   4. CKD (chronic kidney disease), stage 3 (moderate)   5. Shortness of breath     PLAN:     In order of problems listed above:  1. Diastolic CHF - I suspect his dyspnea is multifactorial.  However, he has a lot of signs and symptoms c/w volume excess.   Diastolic function could not be assessed with his recent echo. However, he did have mod LVH.  He feels better after taking Lasix x 3 days.  His BP is looking better too.   -  Continue Furosemide 20 mg QD   -  BMET, BNP today  -  BMET 1 week.  -  Keep FU as planned unless testing is abnormal.   2. HTN - BP improved.  He knows to call if BP running high. I would increase Losartan to 100 mg QD at that point.  3. Atrial Fibrillation - Maintaining NSR. He has a lot of PACs. Continue Xarelto and Flecainide.  ETT is pending.    4. CKD - Will monitor renal function and K+ closely with addition of loop diuretic.  5. Dyspnea - As noted, his dyspnea is likely multifactorial and related to COPD, AFib, diastolic HF, deconditioning.  I will review with Dr. Allegra Lai to see if we should change his ETT to an ETT-Myoview to rule out ischemic heart disease.      Medication Adjustments/Labs and Tests Ordered: Current medicines are reviewed at length with the patient today.  Concerns regarding medicines are outlined above.  Medication changes, Labs and Tests ordered today are outlined in the Patient Instructions noted below. Patient Instructions  Medication Instructions:  1. START LASIX 20 MG DAILY; RX SENT IN  Labwork: 1. TODAY BMET, BNP  2. BMET 2/13  Testing/Procedures: NONE  Follow-Up: KEEP YOUR APPT WITH DR. Curt Bears AS PLANNED  Any Other Special Instructions Will Be Listed Below (If Applicable).  If you need a refill on your cardiac medications before your next appointment, please call your pharmacy.     Signed, Richardson Dopp, PA-C  06/22/2015 10:06 AM    [facility] [id number]  [address] [place], [place]  [address] Phone: [phone]; Fax: [phone]

## 2015-06-22 ENCOUNTER — Encounter: Payer: Self-pay | Admitting: Physician Assistant

## 2015-06-22 ENCOUNTER — Telehealth: Payer: Self-pay | Admitting: *Deleted

## 2015-06-22 ENCOUNTER — Ambulatory Visit (INDEPENDENT_AMBULATORY_CARE_PROVIDER_SITE_OTHER): Payer: Medicare PPO | Admitting: Physician Assistant

## 2015-06-22 VITALS — BP 142/80 | HR 70 | Ht 68.5 in | Wt 170.8 lb

## 2015-06-22 DIAGNOSIS — I5032 Chronic diastolic (congestive) heart failure: Secondary | ICD-10-CM | POA: Diagnosis not present

## 2015-06-22 DIAGNOSIS — R0602 Shortness of breath: Secondary | ICD-10-CM

## 2015-06-22 DIAGNOSIS — I10 Essential (primary) hypertension: Secondary | ICD-10-CM

## 2015-06-22 DIAGNOSIS — I481 Persistent atrial fibrillation: Secondary | ICD-10-CM

## 2015-06-22 DIAGNOSIS — I4819 Other persistent atrial fibrillation: Secondary | ICD-10-CM

## 2015-06-22 DIAGNOSIS — N189 Chronic kidney disease, unspecified: Secondary | ICD-10-CM | POA: Insufficient documentation

## 2015-06-22 DIAGNOSIS — N183 Chronic kidney disease, stage 3 (moderate): Secondary | ICD-10-CM | POA: Diagnosis not present

## 2015-06-22 LAB — BASIC METABOLIC PANEL
BUN: 17 mg/dL (ref 7–25)
CHLORIDE: 106 mmol/L (ref 98–110)
CO2: 30 mmol/L (ref 20–31)
Calcium: 8.4 mg/dL — ABNORMAL LOW (ref 8.6–10.3)
Creat: 1.27 mg/dL — ABNORMAL HIGH (ref 0.70–1.11)
Glucose, Bld: 82 mg/dL (ref 65–99)
POTASSIUM: 3.8 mmol/L (ref 3.5–5.3)
SODIUM: 141 mmol/L (ref 135–146)

## 2015-06-22 LAB — BRAIN NATRIURETIC PEPTIDE: Brain Natriuretic Peptide: 342.5 pg/mL — ABNORMAL HIGH (ref <100)

## 2015-06-22 MED ORDER — FUROSEMIDE 20 MG PO TABS: 20.0000 mg | ORAL_TABLET | Freq: Every day | ORAL | Status: DC

## 2015-06-22 NOTE — Patient Instructions (Addendum)
Medication Instructions:  1. START LASIX 20 MG DAILY; RX SENT IN  Labwork: 1. TODAY BMET, BNP  2. BMET 2/13  Testing/Procedures: NONE  Follow-Up: KEEP YOUR APPT WITH DR. Curt Bears AS PLANNED  Any Other Special Instructions Will Be Listed Below (If Applicable).  If you need a refill on your cardiac medications before your next appointment, please call your pharmacy.

## 2015-06-22 NOTE — Telephone Encounter (Signed)
Both pt and wife notified of lab reuslts and findings by phone w/verbal understanding. Advised pt to continue on current Tx plan as outlined at ov today. BMET 2/13. Pt agreeable to plan of care.

## 2015-06-23 ENCOUNTER — Telehealth: Payer: Self-pay | Admitting: Physician Assistant

## 2015-06-23 DIAGNOSIS — R0600 Dyspnea, unspecified: Secondary | ICD-10-CM

## 2015-06-23 DIAGNOSIS — I4819 Other persistent atrial fibrillation: Secondary | ICD-10-CM

## 2015-06-23 NOTE — Telephone Encounter (Signed)
Please tell patient I reviewed with Dr. Allegra Lai.  We want to change him to an ETT-Myoview. Dx:  Flecainide; Shortness of Breath Please change plain ETT (POET) to an ETT-Myoview. I have placed the order for the Myoview in the system. Richardson Dopp, PA-C   06/23/2015 3:32 PM

## 2015-06-25 ENCOUNTER — Telehealth: Payer: Self-pay | Admitting: *Deleted

## 2015-06-25 NOTE — Telephone Encounter (Signed)
Both Austin [name] and wife were on the phone and were advised of myoview instructions, since [place]. PA and Dr. Curt Bears have changed GXT to ETT Myoview due to Flecainide. Austin [name] states he is allergic to IVP Dye and wants to know if he needs pre med before test.

## 2015-06-25 NOTE — Telephone Encounter (Signed)
Pt and wife have notified of myoview instructions by phone with verbal understanding that pt does not need any pre med due to IVP dye allergy in order to have myoview. Instructions mailed to pt as well.

## 2015-06-25 NOTE — Telephone Encounter (Signed)
No contrast is used so he does not need any pre-treatment. Richardson Dopp, PA-C   06/25/2015 1:21 PM

## 2015-06-29 ENCOUNTER — Other Ambulatory Visit (INDEPENDENT_AMBULATORY_CARE_PROVIDER_SITE_OTHER): Payer: Medicare PPO | Admitting: *Deleted

## 2015-06-29 ENCOUNTER — Ambulatory Visit (HOSPITAL_COMMUNITY): Payer: Medicare PPO | Attending: Internal Medicine

## 2015-06-29 DIAGNOSIS — I481 Persistent atrial fibrillation: Secondary | ICD-10-CM | POA: Insufficient documentation

## 2015-06-29 DIAGNOSIS — R0600 Dyspnea, unspecified: Secondary | ICD-10-CM | POA: Diagnosis not present

## 2015-06-29 DIAGNOSIS — I10 Essential (primary) hypertension: Secondary | ICD-10-CM

## 2015-06-29 DIAGNOSIS — N183 Chronic kidney disease, stage 3 (moderate): Secondary | ICD-10-CM | POA: Diagnosis not present

## 2015-06-29 DIAGNOSIS — R9439 Abnormal result of other cardiovascular function study: Secondary | ICD-10-CM | POA: Diagnosis not present

## 2015-06-29 DIAGNOSIS — I4819 Other persistent atrial fibrillation: Secondary | ICD-10-CM

## 2015-06-29 LAB — BASIC METABOLIC PANEL
BUN: 14 mg/dL (ref 7–25)
CHLORIDE: 105 mmol/L (ref 98–110)
CO2: 28 mmol/L (ref 20–31)
Calcium: 8.9 mg/dL (ref 8.6–10.3)
Creat: 1.17 mg/dL — ABNORMAL HIGH (ref 0.70–1.11)
GLUCOSE: 86 mg/dL (ref 65–99)
POTASSIUM: 3.4 mmol/L — AB (ref 3.5–5.3)
SODIUM: 141 mmol/L (ref 135–146)

## 2015-06-29 MED ORDER — REGADENOSON 0.4 MG/5ML IV SOLN
0.4000 mg | Freq: Once | INTRAVENOUS | Status: AC
  Administered 2015-06-29: 0.4 mg via INTRAVENOUS

## 2015-06-29 MED ORDER — TECHNETIUM TC 99M SESTAMIBI GENERIC - CARDIOLITE
32.7000 | Freq: Once | INTRAVENOUS | Status: AC | PRN
  Administered 2015-06-29: 32.7 via INTRAVENOUS

## 2015-06-29 MED ORDER — TECHNETIUM TC 99M SESTAMIBI GENERIC - CARDIOLITE
11.0000 | Freq: Once | INTRAVENOUS | Status: AC | PRN
  Administered 2015-06-29: 11 via INTRAVENOUS

## 2015-06-29 NOTE — Addendum Note (Signed)
Addended by: Velna Ochs on: 06/29/2015 12:35 PM   Modules accepted: Orders

## 2015-06-30 ENCOUNTER — Telehealth: Payer: Self-pay | Admitting: *Deleted

## 2015-06-30 ENCOUNTER — Telehealth: Payer: Self-pay | Admitting: Cardiology

## 2015-06-30 DIAGNOSIS — N183 Chronic kidney disease, stage 3 (moderate): Secondary | ICD-10-CM

## 2015-06-30 DIAGNOSIS — I4819 Other persistent atrial fibrillation: Secondary | ICD-10-CM

## 2015-06-30 DIAGNOSIS — I10 Essential (primary) hypertension: Secondary | ICD-10-CM

## 2015-06-30 LAB — MYOCARDIAL PERFUSION IMAGING
CHL CUP NUCLEAR SDS: 0
CHL CUP NUCLEAR SSS: 4
LHR: 0.39
LV dias vol: 80 mL
LV sys vol: 36 mL
Peak HR: 117 {beats}/min
Rest HR: 99 {beats}/min
SRS: 4
TID: 0.92

## 2015-06-30 MED ORDER — POTASSIUM CHLORIDE CRYS ER 20 MEQ PO TBCR: 20.0000 meq | EXTENDED_RELEASE_TABLET | Freq: Every day | ORAL | Status: DC

## 2015-06-30 NOTE — Telephone Encounter (Signed)
Patient was back in afib for nuclear stress

## 2015-06-30 NOTE — Telephone Encounter (Signed)
DPR on file for wife who has been notified of lab results and findings and to have pt start K+ 20 meq daily; Rx sent in. Wife asked did we have stress test results in yet. I said yes, however Nicki Reaper was waiting to review test with Dr. Curt Bears.

## 2015-07-01 ENCOUNTER — Inpatient Hospital Stay (HOSPITAL_COMMUNITY): Admission: RE | Admit: 2015-07-01 | Payer: Medicare PPO | Source: Ambulatory Visit

## 2015-07-02 ENCOUNTER — Telehealth: Payer: Self-pay | Admitting: Cardiology

## 2015-07-02 NOTE — Telephone Encounter (Signed)
Please call her,she did not give any details. °

## 2015-07-03 ENCOUNTER — Other Ambulatory Visit: Payer: Self-pay | Admitting: Physician Assistant

## 2015-07-03 DIAGNOSIS — I10 Essential (primary) hypertension: Secondary | ICD-10-CM

## 2015-07-03 DIAGNOSIS — I4819 Other persistent atrial fibrillation: Secondary | ICD-10-CM

## 2015-07-03 MED ORDER — CARVEDILOL 6.25 MG PO TABS: 12.5000 mg | ORAL_TABLET | Freq: Two times a day (BID) | ORAL | Status: DC

## 2015-07-05 NOTE — Progress Notes (Signed)
Electrophysiology Office Note   Date:  07/06/2015   ID:  Austin [name], DOB Sep 22, 1930, MRN [id number]  PCP:  Geoffery Lyons, MD  Primary Electrophysiologist:  Constance Haw, MD    Chief Complaint  Patient presents with  . Follow-up     History of Present Illness: Austin [name] is a 82 y.o. male who presents today for electrophysiology evaluation.   He has a h/o melanoma of rt eyelid with subsequent loss of vision in rt eye, COPD, CKD, recently diagnosed sleep apnea,evaluated in the afib clinic, 03/17/28, for new onset afib. He was seen in his PCP's office; an irregular heart beat was noted and EKG confirmed afib with RVR. He had cardioversion on 1/20 but subsequently went back into atrial fibrillation. He was started on 50 mg of flecainide and his last visit. He was found to be in atrial fibrillation at the time of his exercise treadmill test.since then he continues to have chest pain. He says that the pain is worse with stress and may be better at rest. He also complains of being short of breath with exertion. He has been in atrial fibrillation and has noticed more fatigue while in atrial fibrillation.   Today, he denies symptoms of palpitations, orthopnea, PND, lower extremity edema, claudication, dizziness, presyncope, syncope, bleeding, or neurologic sequela. The patient is tolerating medications without difficulties and is otherwise without complaint today.    Past Medical History  Diagnosis Date  . Melanoma in situ of right eyelid ([place])   . Allergy   . Asthma   . Gout   . Cough   . OSA (obstructive sleep apnea)   . CKD (chronic kidney disease) stage 3, GFR 30-59 ml/min   . Hypertensive chronic kidney disease   . BPH (benign prostatic hypertrophy)   . Persistent atrial fibrillation [facility])    Past Surgical History  Procedure Laterality Date  . Eye surgery    . Cardioversion [name] 06/05/2015    Procedure: CARDIOVERSION;  Surgeon: Sanda Klein, MD;  Location:  [facility];  Service: Cardiovascular;  Laterality: [name];     Current Outpatient Prescriptions  Medication Sig Dispense Refill  . acetaminophen (TYLENOL) 500 MG tablet Take 1,000 mg by mouth daily as needed for mild pain or headache.    . albuterol (PROVENTIL) (2.5 MG/3ML) 0.083% nebulizer solution Take 2.5 mg by nebulization every 6 (six) hours as needed for wheezing or shortness of breath.    . benzonatate (TESSALON) 200 MG capsule TAKE 1 CAPSULE at bedtime as needed for cough  1  . budesonide-formoterol (SYMBICORT) 160-4.5 MCG/ACT inhaler Inhale 2 puffs into the lungs 2 (two) times daily.    . butalbital-acetaminophen-caffeine (FIORICET WITH CODEINE) 50-325-40-30 MG per capsule Take 1 capsule by mouth every 4 (four) hours as needed for headache.    . butalbital-acetaminophen-caffeine (FIORICET, ESGIC) 50-325-40 MG tablet Take 1 tablet by mouth every 4 (four) hours as needed for headache.     . carvedilol (COREG) 6.25 MG tablet Take 2 tablets (12.5 mg total) by mouth 2 (two) times daily. 120 tablet 6  . clobetasol cream (TEMOVATE) AB-123456789 % Apply 1 application topically 2 (two) times daily as needed (for itching).     Marland Kitchen diltiazem (CARDIZEM CD) 120 MG 24 hr capsule Take 120 mg by mouth 2 (two) times daily.  3  . diphenhydrAMINE (BENADRYL) 25 MG tablet Take 1 tablet (25 mg total) by mouth every 6 (six) hours as needed for itching or allergies. 20 tablet 0  .  EPINEPHrine 0.15 MG/0.15ML IJ injection Inject 0.15 mLs (0.15 mg total) into the muscle as needed for anaphylaxis. 2 Device 0  . flecainide (TAMBOCOR) 50 MG tablet Take 1 tablet (50 mg total) by mouth 2 (two) times daily. 60 tablet 3  . fluocinonide gel (LIDEX) AB-123456789 % Apply 1 application topically daily as needed (FOR ITCHING).     . fluorouracil (EFUDEX) 5 % cream Apply 1 application topically daily as needed (FOR ITCHING).   0  . fluticasone (FLONASE) 50 MCG/ACT nasal spray Place 2 sprays into both nostrils at bedtime.   8  . furosemide  (LASIX) 20 MG tablet Take 1 tablet (20 mg total) by mouth daily. 30 tablet 11  . gabapentin (NEURONTIN) 300 MG capsule TAKE 1 CAPSULE NIGHTLY AND INCREASE TO 1 CAPSULE EVERY 8 HOURS AS TOLERATED for itching  5  . indomethacin (INDOCIN) 50 MG capsule Take 50 mg by mouth daily as needed (for gout flare up).     . LORazepam (ATIVAN) 1 MG tablet Take 1 mg by mouth at bedtime as needed for sleep. Reported on 06/02/2015    . losartan (COZAAR) 50 MG tablet Take 50 mg by mouth daily.    . potassium chloride SA (K-DUR,KLOR-CON) 20 MEQ tablet Take 1 tablet (20 mEq total) by mouth daily. 30 tablet 11  . Rivaroxaban (XARELTO) 15 MG TABS tablet Take 1 tablet (15 mg total) by mouth daily with supper. 90 tablet 3  . SYMBICORT 80-4.5 MCG/ACT inhaler Inhale 2 puffs into the lungs 2 (two) times daily.    . valACYclovir (VALTREX) 1000 MG tablet Take 1,000 mg by mouth daily.    Dema Severin Petrolatum-Mineral Oil (SYSTANE NIGHTTIME OP) Place 1 drop into the right eye 3 (three) times daily as needed (for dry eyes).     Marland Kitchen zolpidem (AMBIEN) 10 MG tablet Take 10 mg by mouth at bedtime as needed for sleep. for sleep  5   No current facility-administered medications for this visit.    Allergies:   Diltiazem; Codeine; Ivp dye; Penicillins; and Tape   Social History:  The patient  reports that he has never smoked. He has never used smokeless tobacco. He reports that he drinks about 0.6 oz of alcohol per week. He reports that he does not use illicit drugs.   Family History:  The patient's family history includes Hypertension in his father; Lung cancer in his father.    ROS:  Please see the history of present illness.   Otherwise, review of systems is positive for chest tightness, palpitations, fatigue.   All other systems are reviewed and negative.    PHYSICAL EXAM: VS:  BP 154/110 mmHg  Pulse 88  Ht 5\' 8"  (1.727 m)  Wt 171 lb (77.565 kg)  BMI 26.01 kg/m2 , BMI Body mass index is 26.01 kg/(m^2). GEN: Well nourished,  well developed, in no acute distress HEENT: normal Neck: no JVD, carotid bruits, or masses Cardiac: irregular rhythm; no murmurs, rubs, or gallops,no edema  Respiratory:  clear to auscultation bilaterally, normal work of breathing GI: soft, nontender, nondistended, + BS MS: no deformity or atrophy Skin: warm and dry Neuro:  Strength and sensation are intact Psych: euthymic mood, full affect  EKG:  EKG is ordered today. The ekg ordered today shows atrial fibrillation, rate 88, nonspecific T wave abnormality  Recent Labs: 06/01/2015: Hemoglobin 14.4; Platelets 344 06/29/2015: BUN 14; Creat 1.17*; Potassium 3.4*; Sodium 141    Lipid Panel  No results found for: CHOL, TRIG, HDL, CHOLHDL,  VLDL, LDLCALC, LDLDIRECT   Wt Readings from Last 3 Encounters:  07/06/15 171 lb (77.565 kg)  06/29/15 170 lb (77.111 kg)  06/22/15 170 lb 12.8 oz (77.474 kg)      Other studies Reviewed: Additional studies/ records that were reviewed today include: TTE 05/18/15  Review of the above records today demonstrates:  - LVEF 60-65%, moderate LVH, normal wall motion , calcified aortic valve with mild regurgitation, upper normal LA size, mild TR, RVSP 37 mmHg, lipomatous hypertrophy of the IAS, normal IVC.  ETT 06/29/15  Nuclear stress EF: 55%.  There was no ST segment deviation noted during stress. Patient was in atrial fibrillation throughout the study.  There is a small defect of mild severity present in the basal inferolateral and mid inferolateral location. The defect is non-reversible. There is a small defect of mild severity present in the basal inferior and mid inferior location. The defect is non-reversible.There is a small defect of mild severity present in the apex location. The defect is non-reversible. No ischemia noted.  This is a low risk study.  The left ventricular ejection fraction is normal (55-65%).   ASSESSMENT AND PLAN:  1.  Persistent atrial fibrillation:at this point he is  in atrial fibrillation while having been on flecainide. It is likely the flecainide Austin [name] not work for him. We Austin [name] plan on starting him on amiodarone 200 mg twice daily for week then 200 mg a day. We Austin [name] have him come back for an EKG to see if he is in normal rhythm. If he is not plan on cardioversion. He is currently on Xarelto. This patients CHA2DS2-VASc Score and unadjusted Ischemic Stroke Rate (% per year) is equal to 3.2 % stroke rate/year from a score of 3 Above score calculated as 1 point each if present [CHF, HTN, DM, Vascular=MI/PAD/Aortic Plaque, Age if 65-74, or Male]  2. Hypertension: blood pressures at home is been elevated as well as to clinic today. We'll indicate increase his Cozaar to 100 mg  3. Chest pain: has had a low risk stress test but continues to have chest pain. He did have defects on his stress test that were nonreversible. He may be having some peri-infarct ischemia. We'll plan on starting him on Imdur 60 mg.     Current medicines are reviewed at length with the patient today.   The patient does not have concerns regarding his medicines.  The following changes were made today:  Imdur, amiodarone  Labs/ tests ordered today include:  No orders of the defined types were placed in this encounter.     Disposition:   FU with [facility] 3 months  Signed, Austin Mellen Meredith Leeds, MD  07/06/2015 3:56 PM     [facility] [address] [place]  [address] [phone] (office) [phone] (fax)

## 2015-07-06 ENCOUNTER — Ambulatory Visit (INDEPENDENT_AMBULATORY_CARE_PROVIDER_SITE_OTHER): Payer: Medicare PPO | Admitting: Cardiology

## 2015-07-06 ENCOUNTER — Encounter: Payer: Self-pay | Admitting: Cardiology

## 2015-07-06 VITALS — BP 154/110 | HR 88 | Ht 68.0 in | Wt 171.0 lb

## 2015-07-06 DIAGNOSIS — I481 Persistent atrial fibrillation: Secondary | ICD-10-CM

## 2015-07-06 DIAGNOSIS — I4819 Other persistent atrial fibrillation: Secondary | ICD-10-CM

## 2015-07-06 MED ORDER — AMIODARONE HCL 200 MG PO TABS: 200.0000 mg | ORAL_TABLET | Freq: Two times a day (BID) | ORAL | Status: DC

## 2015-07-06 MED ORDER — LOSARTAN POTASSIUM 100 MG PO TABS: 100.0000 mg | ORAL_TABLET | Freq: Every day | ORAL | Status: DC

## 2015-07-06 MED ORDER — ISOSORBIDE MONONITRATE ER 60 MG PO TB24: 60.0000 mg | ORAL_TABLET | Freq: Every day | ORAL | Status: DC

## 2015-07-06 MED ORDER — AMIODARONE HCL 200 MG PO TABS: 200.0000 mg | ORAL_TABLET | Freq: Every day | ORAL | Status: DC

## 2015-07-06 NOTE — Patient Instructions (Signed)
Medication Instructions:  Your physician has recommended you make the following change in your medication:  1) STOP Flecainide 2) START Amiodarone  - take 200 mg twice a day for seven days, then reduce and   - take 200 mg daily 3) START Imdur 60 mg daily  Labwork: None ordered  Testing/Procedures: None ordered  Follow-Up: Your physician recommends that you schedule a follow-up appointment in: 2 weeks for nurse visit EKG.  Your physician recommends that you schedule a follow-up appointment in: 3 months with Dr. Curt Bears.  If you need a refill on your cardiac medications before your next appointment, please call your pharmacy.  Thank you for choosing [facility]!!   Trinidad Curet, RN [phone]

## 2015-07-06 NOTE — Telephone Encounter (Signed)
Notes Recorded by Liliane Shi, PA-C on 07/03/2015 at 9:43 AM Discussed stress test with patient and wife over the phone. His BP is running 150-160/90s at home. I have asked him to increase his Carvedilol to 12.5 mg bid (Rx sent to his pharmacy by me) Remain on Flecainide. Will bring him in to see Dr. Curt Bears Monday 2/20 at 3:30 pm for FU. Will let Dr. Curt Bears make further recs regarding Flecainide (DC vs increase dose) He agreed with this plan Richardson Dopp, PA-C  07/03/2015 9:40 AM

## 2015-07-10 ENCOUNTER — Telehealth: Payer: Self-pay | Admitting: *Deleted

## 2015-07-10 ENCOUNTER — Other Ambulatory Visit (INDEPENDENT_AMBULATORY_CARE_PROVIDER_SITE_OTHER): Payer: Medicare PPO | Admitting: *Deleted

## 2015-07-10 DIAGNOSIS — I481 Persistent atrial fibrillation: Secondary | ICD-10-CM

## 2015-07-10 DIAGNOSIS — I10 Essential (primary) hypertension: Secondary | ICD-10-CM | POA: Diagnosis not present

## 2015-07-10 DIAGNOSIS — I4891 Unspecified atrial fibrillation: Secondary | ICD-10-CM | POA: Diagnosis not present

## 2015-07-10 DIAGNOSIS — N183 Chronic kidney disease, stage 3 (moderate): Secondary | ICD-10-CM

## 2015-07-10 DIAGNOSIS — I4819 Other persistent atrial fibrillation: Secondary | ICD-10-CM

## 2015-07-10 LAB — BASIC METABOLIC PANEL
BUN: 14 mg/dL (ref 7–25)
CALCIUM: 8.2 mg/dL — AB (ref 8.6–10.3)
CO2: 26 mmol/L (ref 20–31)
CREATININE: 1.53 mg/dL — AB (ref 0.70–1.11)
Chloride: 106 mmol/L (ref 98–110)
Glucose, Bld: 86 mg/dL (ref 65–99)
Potassium: 3.9 mmol/L (ref 3.5–5.3)
Sodium: 141 mmol/L (ref 135–146)

## 2015-07-10 NOTE — Telephone Encounter (Signed)
Pt notified of lab results by phone with verbal understanding. Pt states does not think the Amiodarone is working. States was told may have to go in the hospital for 3 days. Pt s/w Sherri P. RN, Dr. Curt Bears nurse. Not sure of this conversation. Pt said he thought Venida Jarvis is to be calling him back on Monday to let him know of possible med change. He did ask if we did not call him until Monday afternoon. Pt states his wife is with her dad who is 27, in [place] and that her dad is not doing well.

## 2015-07-13 ENCOUNTER — Telehealth: Payer: Self-pay | Admitting: Cardiology

## 2015-07-13 ENCOUNTER — Ambulatory Visit: Payer: Medicare PPO | Admitting: Cardiovascular Disease

## 2015-07-13 DIAGNOSIS — G4730 Sleep apnea, unspecified: Secondary | ICD-10-CM | POA: Diagnosis not present

## 2015-07-13 DIAGNOSIS — Z79899 Other long term (current) drug therapy: Secondary | ICD-10-CM

## 2015-07-13 DIAGNOSIS — N529 Male erectile dysfunction, unspecified: Secondary | ICD-10-CM | POA: Diagnosis not present

## 2015-07-13 DIAGNOSIS — I4891 Unspecified atrial fibrillation: Secondary | ICD-10-CM | POA: Diagnosis not present

## 2015-07-13 DIAGNOSIS — J309 Allergic rhinitis, unspecified: Secondary | ICD-10-CM | POA: Diagnosis not present

## 2015-07-13 DIAGNOSIS — N183 Chronic kidney disease, stage 3 (moderate): Secondary | ICD-10-CM | POA: Diagnosis not present

## 2015-07-13 DIAGNOSIS — N400 Enlarged prostate without lower urinary tract symptoms: Secondary | ICD-10-CM | POA: Diagnosis not present

## 2015-07-13 DIAGNOSIS — Z6826 Body mass index (BMI) 26.0-26.9, adult: Secondary | ICD-10-CM | POA: Diagnosis not present

## 2015-07-13 DIAGNOSIS — I129 Hypertensive chronic kidney disease with stage 1 through stage 4 chronic kidney disease, or unspecified chronic kidney disease: Secondary | ICD-10-CM | POA: Diagnosis not present

## 2015-07-13 DIAGNOSIS — J45909 Unspecified asthma, uncomplicated: Secondary | ICD-10-CM | POA: Diagnosis not present

## 2015-07-13 NOTE — Telephone Encounter (Addendum)
Spoke with patient and his wife on phone. After careful consideration they would like to stop Amiodarone and switch to Tikosyn. Their statement is that patient only has eye sight in one eye and respiratory issues and don't want either exacerbated by a medication.  Don't want to risk it.  They are aware I will review with Dr. Rupert Stacks and call them back tomorrow. Advised, per Camnitz, to stop Amiodarone today.

## 2015-07-13 NOTE — Telephone Encounter (Signed)
Please call,concerning his Amiodarone. He wants to go in the hospital and try the medicine that does not have any side effects.

## 2015-07-14 DIAGNOSIS — L0109 Other impetigo: Secondary | ICD-10-CM | POA: Diagnosis not present

## 2015-07-14 DIAGNOSIS — Z85828 Personal history of other malignant neoplasm of skin: Secondary | ICD-10-CM | POA: Diagnosis not present

## 2015-07-14 DIAGNOSIS — L0101 Non-bullous impetigo: Secondary | ICD-10-CM | POA: Diagnosis not present

## 2015-07-14 NOTE — Telephone Encounter (Signed)
Discussed with Gay Filler, pharmacist. Called and advised patient to continue to stay off Amiodarone. Informed that we would need to draw Amiodarone level on Monday. Explained that results take a few days to results and that we would call him by the end of next week to discuss when he can begin Tikosyn adx. Patient verbalized understanding and agreeable to plan.  (He asks that I call his wife at [phone] to inform her of plan. Told him I would.  Plan is to call her this afternoon)

## 2015-07-14 NOTE — Telephone Encounter (Signed)
Informed wife of plan, per pt request.

## 2015-07-15 DIAGNOSIS — H16239 Neurotrophic keratoconjunctivitis, unspecified eye: Secondary | ICD-10-CM | POA: Diagnosis not present

## 2015-07-15 DIAGNOSIS — H30141 Acute posterior multifocal placoid pigment epitheliopathy, right eye: Secondary | ICD-10-CM | POA: Diagnosis not present

## 2015-07-15 DIAGNOSIS — C6900 Malignant neoplasm of unspecified conjunctiva: Secondary | ICD-10-CM | POA: Diagnosis not present

## 2015-07-19 DIAGNOSIS — G4733 Obstructive sleep apnea (adult) (pediatric): Secondary | ICD-10-CM | POA: Diagnosis not present

## 2015-07-20 ENCOUNTER — Other Ambulatory Visit (INDEPENDENT_AMBULATORY_CARE_PROVIDER_SITE_OTHER): Payer: Medicare PPO | Admitting: *Deleted

## 2015-07-20 DIAGNOSIS — Z79899 Other long term (current) drug therapy: Secondary | ICD-10-CM | POA: Diagnosis not present

## 2015-07-20 NOTE — Addendum Note (Signed)
Addended by: Eulis Foster on: 07/20/2015 09:42 AM   Modules accepted: Orders

## 2015-07-22 LAB — AMIODARONE LEVEL: DESETHYLAMIODARONE: 0.1 ug/mL — AB (ref 1.5–2.5)

## 2015-07-23 ENCOUNTER — Telehealth: Payer: Self-pay | Admitting: Cardiology

## 2015-07-23 NOTE — Telephone Encounter (Signed)
Informed that lab is back but not reviewed by Camnitz yet. Informed that level is low enough to begin Tikosyn. Explained that I would speak with him today or beginning of next week to get approval to move forward w/ Tikosyn adx. She verbalized understanding and aware I will call them once reviewed with physician.

## 2015-07-23 NOTE — Telephone Encounter (Signed)
New message ° ° ° ° °Calling for lab results °

## 2015-07-24 NOTE — Telephone Encounter (Signed)
Informed pt's wife that Amiodarone lab came back low enough to begin Tikosyn. She is aware Gay Filler, pharmacist, will contact them to arrange this hospital adx.

## 2015-07-29 ENCOUNTER — Ambulatory Visit (INDEPENDENT_AMBULATORY_CARE_PROVIDER_SITE_OTHER): Payer: Medicare PPO | Admitting: Neurology

## 2015-07-29 ENCOUNTER — Encounter: Payer: Self-pay | Admitting: Neurology

## 2015-07-29 VITALS — BP 140/86 | HR 70 | Resp 20 | Ht 68.0 in | Wt 172.0 lb

## 2015-07-29 DIAGNOSIS — J449 Chronic obstructive pulmonary disease, unspecified: Secondary | ICD-10-CM

## 2015-07-29 DIAGNOSIS — G4733 Obstructive sleep apnea (adult) (pediatric): Secondary | ICD-10-CM

## 2015-07-29 DIAGNOSIS — I4891 Unspecified atrial fibrillation: Secondary | ICD-10-CM | POA: Insufficient documentation

## 2015-07-29 DIAGNOSIS — Z7189 Other specified counseling: Secondary | ICD-10-CM | POA: Diagnosis not present

## 2015-07-29 NOTE — Patient Instructions (Signed)
Dofetilide capsules What is this medicine? DOFETILIDE (doe FET il ide) is an antiarrhythmic drug. It helps make your heart beat regularly. This medicine also helps to slow rapid heartbeats. This medicine may be used for other purposes; ask your health care provider or pharmacist if you have questions. What should I tell my health care provider before I take this medicine? They need to know if you have any of these conditions: -heart disease -history of low levels of potassium or magnesium -kidney disease -liver disease -an unusual or allergic reaction to dofetilide, other medicines, foods, dyes, or preservatives -pregnant or trying to get pregnant -breast-feeding How should I use this medicine? Take this medicine by mouth with a glass of water. Follow the directions on the prescription label. You can take this medicine with or without food. Do not drink grapefruit juice with this medicine. Take your doses at regular intervals. Do not take your medicine more often than directed. Do not stop taking this medicine suddenly. This may cause serious, heart-related side effects. Your doctor will tell you how much medicine to take. If your doctor wants you to stop the medicine, the dose will be slowly lowered over time to avoid any side effects. Talk to your pediatrician regarding the use of this medicine in children. Special care may be needed. Overdosage: If you think you have taken too much of this medicine contact a poison control center or emergency room at once. NOTE: This medicine is only for you. Do not share this medicine with others. What if I miss a dose? If you miss a dose, take it as soon as you can. If it is almost time for your next dose, take only that dose. Do not take double or extra doses. What may interact with this medicine? Do not take this medicine with any of the following medications: -cimetidine -dolutegravir -hydrochlorothiazide alone or in combination with other  medicines -ketoconazole -megestrol -other medicines that prolong the QT interval (cause an abnormal heart rhythm) -prochlorperazine -trimethoprim alone or in combination with sulfamethoxazole -verapamil This medicine may also interact with the following medications: -amiloride -certain antidepressants like fluvoxamine or paroxetine -certain antiviral medicines for HIV or AIDS like atazanavir or darunavir -certain medicines for fungal infections like clotrimazole or miconazole -digoxin -diltiazem -dronabinol, THC -grapefruit juice -metformin -nefazodone -triamterene -zafirlukast This list may not describe all possible interactions. Give your health care provider a list of all the medicines, herbs, non-prescription drugs, or dietary supplements you use. Also tell them if you smoke, drink alcohol, or use illegal drugs. Some items may interact with your medicine. What should I watch for while using this medicine? Visit your doctor or health care professional for regular checks on your progress. Wear a medical ID bracelet or chain, and carry a card that describes your disease and details of your medicine and dosage times. Check your heart rate and blood pressure regularly while you are taking this medicine. Ask your doctor or health care professional what your heart rate and blood pressure should be, and when you should contact him or her. Your doctor or health care professional also may schedule regular tests to check your progress. You will be started on this medicine in a specialized facility for at least three days. You will be monitored to find the right dose of medicine for you. It is very important that you take your medicine exactly as prescribed when you leave the hospital. The correct dosing of this medicine is very important to treat your condition and  prevent possible serious side effects. What side effects may I notice from receiving this medicine? Side effects that you should  report to your doctor or health care professional as soon as possible: -allergic reactions like skin rash, itching or hives, swelling of the face, lips, or tongue -breathing problems -dizziness -fast or rapid beating of the heart -feeling faint or lightheaded -swelling of the ankles -unusually weak or tired -vomiting Side effects that usually do not require medical attention (report to your doctor or health care professional if they continue or are bothersome): -cough -diarrhea -difficulty sleeping -headache -nausea -stomach pain This list may not describe all possible side effects. Call your doctor for medical advice about side effects. You may report side effects to FDA at 1-800-FDA-1088. Where should I keep my medicine? Keep out of the reach of children. Store at room temperature between 15 and 30 degrees C (59 and 86 degrees F). Protect the medicine from moisture or humidity. Keep container tightly closed. Throw away any unused medicine after the expiration date. NOTE: This sheet is a summary. It may not cover all possible information. If you have questions about this medicine, talk to your doctor, pharmacist, or health care provider.    2016, Elsevier/Gold Standard. (2013-12-23 16:21:18)

## 2015-07-29 NOTE — Progress Notes (Addendum)
SLEEP MEDICINE CLINIC   Provider:  Larey [name], M D  Referring Provider: Burnard Bunting, MD Primary Care Physician:  Austin Lyons, MD  Chief Complaint  Patient presents with  . Follow-up    doesn't like it, but does ok with it, rm 10 with wife   Chief complaint according to patient : " I snore "   HPI:  Austin [name] is a 82 y.o. male , seen here as a referral from Dr. Reynaldo [name] for a sleep consultation,  Austin [name] is seen here in the presence of his wife out of concern for his sleep breathing. The patient has a history of asthma and COPD as well as hypertension. He has a history of corneal ulcer form shingles and has cancer. He was treated with chemotherapy in the treatment of melanoma and developed cataracts. By now he has lost sight in his right eye which is cloudy with neovascularization. Austin [name] has been witnessed to snore and his wife has occasionally noted him to grasp or bruits a little irregular but she is not sure that he has any apnea per se. He does not sleep in supine position but on his side. Occasionally he will wake up from a gasping for air or from a snore-  and that is more likely if he fell asleep in a recliner or in a seated position.  Sleep habits are as follows: 9.00 PM is his usual bedtime but he developed a very long sleep latency. He is usually ready by the time he goes to bed to sleep and does not feel that he has racing thoughts or extreme worry is keeping him from sleeping. He has begun taking a sleep aid 1 hour before intended bedtime which helps him to initiate sleep, but sleep is interrupted 2-3 times by his urge to urinate. He rises in the morning at 5:30 spontaneously or being woken by his dog. He feels ready to go in the morning restored or refreshed. After lunch he likes to nap this is usually 15-30 minutes. He does not report dreams and his nap time and he does not dream a lot at night either. His wife has reported that he sleep talks. He  is not a sleep walker and he reports no nightmares, or REM behavior. He will get about 7 hours of nocturnal sleep, sleeps on his side not in supine. The bedroom is cool, quiet and dark. Sleep medical history and family sleep history: all siblings deceased, no known sleep disorder history.  Social history:  Married, right-handed Caucasian male retired. 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 He is a nonsmoker, he does drink alcohol socially, less than 4 drinks a week. 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  Interval history from 07/29/2015, Austin [name] is here following up on 2 sleep studies. He underwent a classical polysomnography and attended sleep study in lap on 04/05/2015 he had a sleep latency of 23.5 minutes and a REM latency of 86 minutes but overall he slept not very well his sleep efficiency was only 64% for the night he had mild apnea with an AHI of 11.2 but his oxygen nadir was concerning 77% was his lowest saturation at night and the total desaturation time was 43.5 minutes. He did not retain CO2.  The study was followed with a CPAP titration full night on 04/26/2015. The patient had 0 apneas during the night his oxygen nadir rose to 90%. He slept for 30  minutes in dream sleep and was in supine sleep position.  Austin [name] was prescribed an AutoPap between 5 and 10 cm water with 2 cm flex function. I'm also able today to review his compliance report he was 100% compliant for 30 out of 30 days was 87% compliance for over 4 hours consecutive use at night. The patient used at the 95th percentile of pressure of 9.9 cm and an AHI of 1.4 was achieved. He does not have major air leaks. The patient reports he is bothered by his nasal pillow and changed to a FFM he reports this  gets dislodged. Now using an AIRFIT F FFM. The patient demonstrated how he used to mask and actually the month with 2 right over his bridge of the nose and leaking can irritate his  eyes and it is also sitting on the tip of the chin and not feeling under his lower lip. I simply believe that this mask is 1 size too big for him.  Review of Systems: Out of a complete 14 system review, the patient complains of only the following symptoms, and all other reviewed systems are negative.  skin lesions, itching,  Blindness right eye .  Epworth score 7, Fatigue severity score 35   , depression score see below.    Social History   Social History  . Marital Status: Married    Spouse Name: [name]  . Number of Children: [name]  . Years of Education: [name]   Occupational History  . Not on file.   Social History Main Topics  . Smoking status: Never Smoker   . Smokeless tobacco: Never Used  . Alcohol Use: 0.6 oz/week    1 Standard drinks or equivalent per week     Comment: occ  . Drug Use: No  . Sexual Activity: Not on file   Other Topics Concern  . Not on file   Social History Narrative   Right Handed.  Caffiene  Coffee, tea 3 cups daily.   Pt retired.     Lives at home.      Family History  Problem Relation Age of Onset  . Hypertension Father   . Lung cancer Father     Past Medical History  Diagnosis Date  . Melanoma in situ of right eyelid ([place])   . Allergy   . Asthma   . Gout   . Cough   . OSA (obstructive sleep apnea)   . CKD (chronic kidney disease) stage 3, GFR 30-59 ml/min   . Hypertensive chronic kidney disease   . BPH (benign prostatic hypertrophy)   . Persistent atrial fibrillation [facility])     Past Surgical History  Procedure Laterality Date  . Eye surgery    . Cardioversion [name] 06/05/2015    Procedure: CARDIOVERSION;  Surgeon: Austin Klein, MD;  Location: [facility];  Service: Cardiovascular;  Laterality: [name];    Current Outpatient Prescriptions  Medication Sig Dispense Refill  . acetaminophen (TYLENOL) 500 MG tablet Take 1,000 mg by mouth daily as needed for mild pain or headache.    . albuterol (PROVENTIL) (2.5 MG/3ML) 0.083% nebulizer  solution Take 2.5 mg by nebulization every 6 (six) hours as needed for wheezing or shortness of breath.    . benzonatate (TESSALON) 200 MG capsule TAKE 1 CAPSULE at bedtime as needed for cough  1  . budesonide-formoterol (SYMBICORT) 160-4.5 MCG/ACT inhaler Inhale 2 puffs into the lungs 2 (two) times daily.    . butalbital-acetaminophen-caffeine (FIORICET WITH CODEINE) 50-325-40-30 MG per  capsule Take 1 capsule by mouth every 4 (four) hours as needed for headache.    . carvedilol (COREG) 6.25 MG tablet Take 2 tablets (12.5 mg total) by mouth 2 (two) times daily. 120 tablet 6  . clobetasol cream (TEMOVATE) [id number] % Apply 1 application topically 2 (two) times daily as needed (for itching).     Marland Kitchen diltiazem (CARDIZEM CD) 120 MG 24 hr capsule Take 120 mg by mouth 2 (two) times daily.  3  . diphenhydrAMINE (BENADRYL) 25 MG tablet Take 1 tablet (25 mg total) by mouth every 6 (six) hours as needed for itching or allergies. 20 tablet 0  . EPINEPHrine 0.15 MG/0.15ML IJ injection Inject 0.15 mLs (0.15 mg total) into the muscle as needed for anaphylaxis. 2 Device 0  . fluocinonide gel (LIDEX) [id number] % Apply 1 application topically daily as needed (FOR ITCHING).     . fluorouracil (EFUDEX) 5 % cream Apply 1 application topically daily as needed (FOR ITCHING).   0  . fluticasone (FLONASE) 50 MCG/ACT nasal spray Place 2 sprays into both nostrils at bedtime.   8  . furosemide (LASIX) 20 MG tablet Take 1 tablet (20 mg total) by mouth daily. 30 tablet 11  . gabapentin (NEURONTIN) 300 MG capsule TAKE 1 CAPSULE NIGHTLY AND INCREASE TO 1 CAPSULE EVERY 8 HOURS AS TOLERATED for itching  5  . indomethacin (INDOCIN) 50 MG capsule Take 50 mg by mouth daily as needed (for gout flare up).     . isosorbide mononitrate (IMDUR) 60 MG 24 hr tablet Take 1 tablet (60 mg total) by mouth daily. 30 tablet 3  . LORazepam (ATIVAN) 1 MG tablet Take 1 mg by mouth at bedtime as needed for sleep. Reported on 06/02/2015    . losartan (COZAAR) 100  MG tablet Take 1 tablet (100 mg total) by mouth daily. 30 tablet 3  . potassium chloride SA (K-DUR,KLOR-CON) 20 MEQ tablet Take 1 tablet (20 mEq total) by mouth daily. 30 tablet 11  . Rivaroxaban (XARELTO) 15 MG TABS tablet Take 1 tablet (15 mg total) by mouth daily with supper. 90 tablet 3  . SYMBICORT 80-4.5 MCG/ACT inhaler Inhale 2 puffs into the lungs 2 (two) times daily.    . valACYclovir (VALTREX) 1000 MG tablet Take 1,000 mg by mouth daily.    Dema Severin Petrolatum-Mineral Oil (SYSTANE NIGHTTIME OP) Place 1 drop into the right eye 3 (three) times daily as needed (for dry eyes).     Marland Kitchen zolpidem (AMBIEN) 10 MG tablet Take 10 mg by mouth at bedtime as needed for sleep. for sleep  5   No current facility-administered medications for this visit.    Allergies as of 07/29/2015 - Review Complete 07/29/2015  Allergen Reaction Noted  . Diltiazem Other (See Comments) 06/02/2015  . Codeine Nausea And Vomiting 03/19/2013  . Ivp dye [iodinated diagnostic agents] Rash 03/19/2013  . Penicillins Swelling and Rash 03/19/2013  . Tape Itching and Rash 06/02/2015    Vitals: BP 140/86 mmHg  Pulse 70  Resp 20  Ht 5\' 8"  (1.727 m)  Wt 172 lb (78.019 kg)  BMI 26.16 kg/m2 Last Weight:  Wt Readings from Last 1 Encounters:  07/29/15 172 lb (78.019 kg)   [id number] mass index is 26.16 kg/(m^2).     Last Height:   Ht Readings from Last 1 Encounters:  07/29/15 5\' 8"  (1.727 m)    Physical exam:  General: The patient is awake, alert and appears not in acute distress. The patient is well  groomed. Head: Normocephalic, atraumatic. Neck is supple. Mallampati 3  neck circumference:15. Nasal airflow  unrestricted , TMJ is evident . Retrognathia is not seen.  Cardiovascular:  Regular rate and rhythm , without  murmurs or carotid bruit, and without distended neck veins. Respiratory: Lungs are clear to auscultation. Skin:  Without evidence of edema, or rash Trunk: BMI is normal . The patient's posture is  erect  Neurologic exam : The patient is awake and alert, oriented to place and time.   Memory subjective described as intact.  lity appears normal.  Speech is fluent,  without dysarthria, dysphonia or aphasia.  Mood and affect are appropriate.  Cranial nerves: Pupils are equal and briskly reactive to light. Funduscopic exam without  evidence of pallor or edema.  Extraocular movements  in vertical and horizontal planes intact and without nystagmus. Visual fields by finger perimetry are intact. Hearing to finger rub intact.   Facial sensation intact to fine touch.  Facial motor strength is symmetric and tongue and uvula move midline. Shoulder shrug was symmetrical.   Motor exam:  Normal tone, muscle bulk and symmetric strength in all extremities. Her Deep tendon reflexes: in the upper and lower extremities are symmetric and intact. Babinski maneuver response is downgoing.  The patient was advised of the nature of the diagnosed sleep disorder , the treatment options and risks for general a health and wellness arising from not treating the condition.  I spent more than 35  minutes of face to face time with the patient. Greater than 50% of time was spent in counseling and coordination of care. We have discussed the diagnosis and differential and I answered the patient's questions.     Assessment:  After physical and neurologic examination, review of laboratory studies,  Personal review of imaging studies, reports of other /same  Imaging studies ,  Results of polysomnography/ neurophysiology testing and pre-existing records as far as provided in visit., my assessment is   1) Mr. Michalski has moderate risk factors  for sleep apnea but he is neither obese nor does he have a narrow upper airway. His comorbidities are COPD and asthma and has hypoxemia- the auto CPAP works well for him at 9.9 cm water. A dental device would not work for this patient.   2) nocturnal leg cramps- obtain circulatory data  with PCP.  No RLS by description.   3) atrial fibrillation, newly diagnosed in December. He is on amiodarone, weaning off to [place]:  Treatment plan and additional workup : Exchange Fit F 10 for a smaller size.  Rv in 6 month, after that yearly    Austin Seat MD  07/29/2015   CC: Austin [name], [name], [place] [address]

## 2015-08-03 ENCOUNTER — Telehealth: Payer: Self-pay

## 2015-08-03 NOTE — Telephone Encounter (Signed)
Noted, thank you

## 2015-08-03 NOTE — Telephone Encounter (Signed)
Patient came by sleep lab for mask fitting. He is currently on [employer] fit f10. It leaks and blows air in his eyes. Patient would like to continue on a fullface mask. Fitted him for F&P simplus small. He did well with this mask. Gave him sample to take home and try.

## 2015-08-04 ENCOUNTER — Encounter: Payer: Self-pay | Admitting: *Deleted

## 2015-08-05 ENCOUNTER — Telehealth: Payer: Self-pay

## 2015-08-05 DIAGNOSIS — Z9989 Dependence on other enabling machines and devices: Principal | ICD-10-CM

## 2015-08-05 DIAGNOSIS — G4733 Obstructive sleep apnea (adult) (pediatric): Secondary | ICD-10-CM

## 2015-08-05 NOTE — Addendum Note (Signed)
Addended by: Lester Golden A on: 08/05/2015 04:59 PM   Modules accepted: Orders

## 2015-08-05 NOTE — Telephone Encounter (Signed)
Patient came by again with mask leak. He attempted the F&P simplus but the exhalation valve blows air in his eyes. He also feels he is not getting enough air. I put him on cpap of 6 with Res med air F20 medium. This mask was to big but patient did great with this. Exhalation valve is at  Lower place on this mask. He needs a small but I did not have a sample. Can we send order to increase Auto 6 and get patient a small mask. I sent him home the medium that he could swap with advanced if needed. I believe these two changes with get patient compliant. Please have advanced call patient Asap. Thanks

## 2015-08-05 NOTE — Telephone Encounter (Signed)
I'm more than happy to change the settings.  I'm very glad that Shirlean Mylar found the best setting for this patient thank you Robin.

## 2015-08-05 NOTE — Telephone Encounter (Signed)
Are you ok if I change the order for pt's cpap to auto 6-10 cm H2O? He is currently at 5-10 cm H2O.

## 2015-08-06 ENCOUNTER — Telehealth: Payer: Self-pay | Admitting: Pharmacist

## 2015-08-06 NOTE — Telephone Encounter (Signed)
Spoke with pt's wife.  Appt moved to 3/27 as discussed.

## 2015-08-06 NOTE — Telephone Encounter (Signed)
New Message  Pt wife calling to clarify tikosyn appt- sched for 3/28 @ 18- pt wife understood the tickosyn study starting on Monday. Pt wife requested to speak w/ RN. Please call back and discuss.

## 2015-08-09 NOTE — Progress Notes (Signed)
Electrophysiology Office Note   Date:  08/10/2015   ID:  Austin [name], DOB [birth year], MRN [id number]  PCP:  Austin Lyons, MD  Primary Electrophysiologist:  Austin Lai, MD   Chief Complaint  Patient presents with  . Atrial Fibrillation    Tikosyn initiation     History of Present Illness: Austin [name] is a 82 y.o. male who presents today for Tikosyn initiation.   He was diagnosed with atrial fibrillation in December 2016.  He was started on metoprolol and Xarelto 15mg  daily.  At his follow up visit in January, he was not tolerating metoprolol due to SOB and fatigue so it was changed to cardizem and DCCV planned.  He underwent cardioversion on 06/05/15.  His first visit with Dr. Curt [name] was on 06/08/15.  He still had some SOB so he was switched to Coreg and started on flecainide 50mg  daily.   Unfortunately he continued to have atrial fibrillation so he was switched to amiodarone on 07/06/15.  Pt's wife called the clinic on 2/27 and stated they had read about amiodarone and wanted to stop and switch to Tikosyn.  Dr. Curt [name] agreed.  Amiodarone level was checked on 3/6 and was <0.1 so plans were made to start Tikosyn at that time.    Pt accompanied with his wife.  Discussed potential side effects of Tikosyn including QTc prolongation.  He is aware of the importance of compliance and will call the office if he misses more than 2 doses.  Reviewed pt's medication list.  Currently not taking any QTc prolongating or contraindicated medications.  He states he has not missed any doses of Xarelto and is taking with evening meal.  EKG reviewed by Dr. Rayann [name].  Atrial fibrillation with vent rate of 90 bpm.  QTc 431 msec.   Past Medical History  Diagnosis Date  . Melanoma in situ of right eyelid ([place])   . Allergy   . Asthma   . Gout   . Cough   . OSA (obstructive sleep apnea)   . CKD (chronic kidney disease) stage 3, GFR 30-59 ml/min   . Hypertensive chronic kidney disease   . BPH  (benign prostatic hypertrophy)   . Persistent atrial fibrillation [facility])       Current Outpatient Prescriptions  Medication Sig Dispense Refill  . carvedilol (COREG) 6.25 MG tablet Take 2 tablets (12.5 mg total) by mouth 2 (two) times daily. 120 tablet 6  . Rivaroxaban (XARELTO) 15 MG TABS tablet Take 1 tablet (15 mg total) by mouth daily with supper. 90 tablet 3  . acetaminophen (TYLENOL) 500 MG tablet Take 1,000 mg by mouth daily as needed for mild pain or headache.    . albuterol (PROVENTIL) (2.5 MG/3ML) 0.083% nebulizer solution Take 2.5 mg by nebulization every 6 (six) hours as needed for wheezing or shortness of breath.    . benzonatate (TESSALON) 200 MG capsule TAKE 1 CAPSULE at bedtime as needed for cough  1  . budesonide-formoterol (SYMBICORT) 160-4.5 MCG/ACT inhaler Inhale 2 puffs into the lungs 2 (two) times daily.    . butalbital-acetaminophen-caffeine (FIORICET WITH CODEINE) 50-325-40-30 MG per capsule Take 1 capsule by mouth every 4 (four) hours as needed for headache.    . clobetasol cream (TEMOVATE) AB-123456789 % Apply 1 application topically 2 (two) times daily as needed (for itching).     . diphenhydrAMINE (BENADRYL) 25 MG tablet Take 1 tablet (25 mg total) by mouth every 6 (six) hours as needed for itching or allergies. [place]  tablet 0  . EPINEPHrine 0.15 MG/0.15ML IJ injection Inject 0.15 mLs (0.15 mg total) into the muscle as needed for anaphylaxis. 2 Device 0  . fluocinonide gel (LIDEX) AB-123456789 % Apply 1 application topically daily as needed (FOR ITCHING).     . fluorouracil (EFUDEX) 5 % cream Apply 1 application topically daily as needed (FOR ITCHING).   0  . fluticasone (FLONASE) 50 MCG/ACT nasal spray Place 2 sprays into both nostrils at bedtime.   8  . furosemide (LASIX) 20 MG tablet Take 1 tablet (20 mg total) by mouth daily. 30 tablet 11  . gabapentin (NEURONTIN) 300 MG capsule TAKE 1 CAPSULE NIGHTLY AND INCREASE TO 1 CAPSULE EVERY 8 HOURS AS TOLERATED for itching  5  .  indomethacin (INDOCIN) 50 MG capsule Take 50 mg by mouth daily as needed (for gout flare up).     . isosorbide mononitrate (IMDUR) 60 MG 24 hr tablet Take 1 tablet (60 mg total) by mouth daily. 30 tablet 3  . LORazepam (ATIVAN) 1 MG tablet Take 1 mg by mouth at bedtime as needed for sleep. Reported on 06/02/2015    . losartan (COZAAR) 100 MG tablet Take 1 tablet (100 mg total) by mouth daily. 30 tablet 3  . potassium chloride SA (K-DUR,KLOR-CON) 20 MEQ tablet Take 1 tablet (20 mEq total) by mouth daily. 30 tablet 11  . SYMBICORT 80-4.5 MCG/ACT inhaler Inhale 2 puffs into the lungs 2 (two) times daily.    . valACYclovir (VALTREX) 1000 MG tablet Take 1,000 mg by mouth daily.    Dema Severin Petrolatum-Mineral Oil (SYSTANE NIGHTTIME OP) Place 1 drop into the right eye 3 (three) times daily as needed (for dry eyes).     Marland Kitchen zolpidem (AMBIEN) 10 MG tablet Take 10 mg by mouth at bedtime as needed for sleep. for sleep  5   No current facility-administered medications for this visit.    Allergies:   Diltiazem; Codeine; Ivp dye; Penicillins; and Tape    ASSESSMENT AND PLAN:  1.  Persistent atrial fibrillation: Reviewed pt's labs.  K and Mg acceptable for Tikosyn.  QTc<440 msec.  SCr 1.41.  CrCl- 45 mL/min.  Would start patient at 242mcg BID.  Pt aware to report to the hospital for admission.   Cyndee Brightly, [facility]  08/10/2015 1:26 PM     [name] [phone] (office) [phone] (fax)

## 2015-08-10 ENCOUNTER — Ambulatory Visit (INDEPENDENT_AMBULATORY_CARE_PROVIDER_SITE_OTHER): Payer: Medicare PPO | Admitting: Pharmacist

## 2015-08-10 ENCOUNTER — Ambulatory Visit: Payer: Self-pay | Admitting: Neurology

## 2015-08-10 ENCOUNTER — Inpatient Hospital Stay (HOSPITAL_COMMUNITY)
Admission: AD | Admit: 2015-08-10 | Discharge: 2015-08-13 | DRG: 310 | Disposition: A | Discharge status: Home / Self Care | Payer: Medicare PPO | Source: Ambulatory Visit | Attending: Cardiology | Admitting: Cardiology

## 2015-08-10 DIAGNOSIS — J449 Chronic obstructive pulmonary disease, unspecified: Secondary | ICD-10-CM | POA: Diagnosis not present

## 2015-08-10 DIAGNOSIS — M109 Gout, unspecified: Secondary | ICD-10-CM | POA: Diagnosis not present

## 2015-08-10 DIAGNOSIS — Z888 Allergy status to other drugs, medicaments and biological substances status: Secondary | ICD-10-CM | POA: Diagnosis not present

## 2015-08-10 DIAGNOSIS — I4581 Long QT syndrome: Secondary | ICD-10-CM | POA: Diagnosis present

## 2015-08-10 DIAGNOSIS — I4891 Unspecified atrial fibrillation: Secondary | ICD-10-CM

## 2015-08-10 DIAGNOSIS — Z801 Family history of malignant neoplasm of trachea, bronchus and lung: Secondary | ICD-10-CM

## 2015-08-10 DIAGNOSIS — Z885 Allergy status to narcotic agent status: Secondary | ICD-10-CM

## 2015-08-10 DIAGNOSIS — G4733 Obstructive sleep apnea (adult) (pediatric): Secondary | ICD-10-CM | POA: Diagnosis not present

## 2015-08-10 DIAGNOSIS — J45909 Unspecified asthma, uncomplicated: Secondary | ICD-10-CM | POA: Diagnosis present

## 2015-08-10 DIAGNOSIS — Z7951 Long term (current) use of inhaled steroids: Secondary | ICD-10-CM | POA: Diagnosis not present

## 2015-08-10 DIAGNOSIS — Z85820 Personal history of malignant melanoma of skin: Secondary | ICD-10-CM

## 2015-08-10 DIAGNOSIS — I4819 Other persistent atrial fibrillation: Secondary | ICD-10-CM | POA: Diagnosis present

## 2015-08-10 DIAGNOSIS — H547 Unspecified visual loss: Secondary | ICD-10-CM | POA: Diagnosis not present

## 2015-08-10 DIAGNOSIS — N183 Chronic kidney disease, stage 3 (moderate): Secondary | ICD-10-CM | POA: Diagnosis present

## 2015-08-10 DIAGNOSIS — I129 Hypertensive chronic kidney disease with stage 1 through stage 4 chronic kidney disease, or unspecified chronic kidney disease: Secondary | ICD-10-CM | POA: Diagnosis present

## 2015-08-10 DIAGNOSIS — Z8249 Family history of ischemic heart disease and other diseases of the circulatory system: Secondary | ICD-10-CM

## 2015-08-10 DIAGNOSIS — I481 Persistent atrial fibrillation: Principal | ICD-10-CM | POA: Diagnosis present

## 2015-08-10 DIAGNOSIS — N400 Enlarged prostate without lower urinary tract symptoms: Secondary | ICD-10-CM | POA: Diagnosis present

## 2015-08-10 DIAGNOSIS — Z7901 Long term (current) use of anticoagulants: Secondary | ICD-10-CM | POA: Diagnosis not present

## 2015-08-10 DIAGNOSIS — Z79899 Other long term (current) drug therapy: Secondary | ICD-10-CM

## 2015-08-10 DIAGNOSIS — Z880 Allergy status to penicillin: Secondary | ICD-10-CM | POA: Diagnosis not present

## 2015-08-10 DIAGNOSIS — Z91041 Radiographic dye allergy status: Secondary | ICD-10-CM | POA: Diagnosis not present

## 2015-08-10 DIAGNOSIS — H5441 Blindness, right eye, normal vision left eye: Secondary | ICD-10-CM | POA: Diagnosis present

## 2015-08-10 LAB — BASIC METABOLIC PANEL
ANION GAP: 10 (ref 5–15)
BUN: 17 mg/dL (ref 6–20)
CALCIUM: 9 mg/dL (ref 8.9–10.3)
CO2: 24 mmol/L (ref 22–32)
Chloride: 108 mmol/L (ref 101–111)
Creatinine, Ser: 1.41 mg/dL — ABNORMAL HIGH (ref 0.61–1.24)
GFR calc Af Amer: 52 mL/min — ABNORMAL LOW (ref >60)
GFR calc non Af Amer: 45 mL/min — ABNORMAL LOW (ref >60)
GLUCOSE: 92 mg/dL (ref 65–99)
POTASSIUM: 4.1 mmol/L (ref 3.5–5.1)
Sodium: 142 mmol/L (ref 135–145)

## 2015-08-10 LAB — MAGNESIUM: Magnesium: 2.2 mg/dL (ref 1.7–2.4)

## 2015-08-10 MED ORDER — INDOMETHACIN 25 MG PO CAPS: 50.0000 mg | ORAL_CAPSULE | Freq: Every day | ORAL | Status: DC | PRN

## 2015-08-10 MED ORDER — VALACYCLOVIR HCL 500 MG PO TABS
1000.0000 mg | ORAL_TABLET | Freq: Every day | ORAL | Status: DC
  Administered 2015-08-10 – 2015-08-12 (×3): 1000 mg via ORAL
  Filled 2015-08-10 (×3): qty 2

## 2015-08-10 MED ORDER — POLYVINYL ALCOHOL 1.4 % OP SOLN
1.0000 [drp] | OPHTHALMIC | Status: DC | PRN
  Filled 2015-08-10: qty 15

## 2015-08-10 MED ORDER — DOFETILIDE 250 MCG PO CAPS
250.0000 ug | ORAL_CAPSULE | Freq: Two times a day (BID) | ORAL | Status: DC
  Administered 2015-08-10 – 2015-08-12 (×4): 250 ug via ORAL
  Filled 2015-08-10 (×4): qty 1

## 2015-08-10 MED ORDER — GABAPENTIN 300 MG PO CAPS
300.0000 mg | ORAL_CAPSULE | Freq: Every day | ORAL | Status: DC
  Administered 2015-08-10 – 2015-08-12 (×3): 300 mg via ORAL
  Filled 2015-08-10 (×3): qty 1

## 2015-08-10 MED ORDER — ZOLPIDEM TARTRATE 5 MG PO TABS
5.0000 mg | ORAL_TABLET | Freq: Every day | ORAL | Status: DC
  Administered 2015-08-10 – 2015-08-12 (×3): 5 mg via ORAL
  Filled 2015-08-10 (×3): qty 1

## 2015-08-10 MED ORDER — FUROSEMIDE 20 MG PO TABS
20.0000 mg | ORAL_TABLET | Freq: Every day | ORAL | Status: DC
  Administered 2015-08-11 – 2015-08-13 (×3): 20 mg via ORAL
  Filled 2015-08-10 (×3): qty 1

## 2015-08-10 MED ORDER — MOMETASONE FURO-FORMOTEROL FUM 200-5 MCG/ACT IN AERO
2.0000 | INHALATION_SPRAY | Freq: Two times a day (BID) | RESPIRATORY_TRACT | Status: DC
  Filled 2015-08-10: qty 8.8

## 2015-08-10 MED ORDER — ISOSORBIDE MONONITRATE ER 60 MG PO TB24
60.0000 mg | ORAL_TABLET | Freq: Every day | ORAL | Status: DC
  Administered 2015-08-11 – 2015-08-13 (×3): 60 mg via ORAL
  Filled 2015-08-10 (×3): qty 1

## 2015-08-10 MED ORDER — LOSARTAN POTASSIUM 50 MG PO TABS: 100.0000 mg | ORAL_TABLET | Freq: Every day | ORAL | Status: DC

## 2015-08-10 MED ORDER — SODIUM CHLORIDE 0.9% FLUSH
3.0000 mL | Freq: Two times a day (BID) | INTRAVENOUS | Status: DC
  Administered 2015-08-10 – 2015-08-11 (×3): 3 mL via INTRAVENOUS

## 2015-08-10 MED ORDER — POTASSIUM CHLORIDE CRYS ER 20 MEQ PO TBCR: 20.0000 meq | EXTENDED_RELEASE_TABLET | Freq: Every day | ORAL | Status: DC

## 2015-08-10 MED ORDER — POLYETHYL GLYCOL-PROPYL GLYCOL 0.4-0.3 % OP SOLN: 1.0000 [drp] | Freq: Two times a day (BID) | OPHTHALMIC | Status: DC

## 2015-08-10 MED ORDER — ALBUTEROL SULFATE (2.5 MG/3ML) 0.083% IN NEBU: 3.0000 mL | INHALATION_SOLUTION | RESPIRATORY_TRACT | Status: DC | PRN

## 2015-08-10 MED ORDER — SODIUM CHLORIDE 0.9% FLUSH: 3.0000 mL | INTRAVENOUS | Status: DC | PRN

## 2015-08-10 MED ORDER — MAGNESIUM OXIDE 400 (241.3 MG) MG PO TABS
400.0000 mg | ORAL_TABLET | Freq: Every day | ORAL | Status: DC
  Administered 2015-08-11 – 2015-08-13 (×3): 400 mg via ORAL
  Filled 2015-08-10 (×3): qty 1

## 2015-08-10 MED ORDER — CLOBETASOL PROPIONATE 0.05 % EX CREA
1.0000 "application " | TOPICAL_CREAM | Freq: Every day | CUTANEOUS | Status: DC
  Administered 2015-08-12: 1 via TOPICAL
  Filled 2015-08-10: qty 15

## 2015-08-10 MED ORDER — LOSARTAN POTASSIUM 50 MG PO TABS
100.0000 mg | ORAL_TABLET | Freq: Every day | ORAL | Status: DC
  Administered 2015-08-10 – 2015-08-13 (×4): 100 mg via ORAL
  Filled 2015-08-10 (×4): qty 2

## 2015-08-10 MED ORDER — CARVEDILOL 12.5 MG PO TABS
12.5000 mg | ORAL_TABLET | Freq: Two times a day (BID) | ORAL | Status: DC
  Administered 2015-08-10 – 2015-08-11 (×2): 12.5 mg via ORAL
  Filled 2015-08-10 (×2): qty 1

## 2015-08-10 MED ORDER — RIVAROXABAN 15 MG PO TABS
15.0000 mg | ORAL_TABLET | Freq: Every day | ORAL | Status: DC
  Administered 2015-08-10 – 2015-08-12 (×3): 15 mg via ORAL
  Filled 2015-08-10 (×3): qty 1

## 2015-08-10 MED ORDER — SODIUM CHLORIDE 0.9 % IV SOLN: 250.0000 mL | INTRAVENOUS | Status: DC | PRN

## 2015-08-10 MED ORDER — FLUTICASONE PROPIONATE 50 MCG/ACT NA SUSP
2.0000 | Freq: Every day | NASAL | Status: DC
  Filled 2015-08-10: qty 16

## 2015-08-10 NOTE — Progress Notes (Signed)
QRS duration 90 ms QT/QTc 384/480 ms

## 2015-08-10 NOTE — Progress Notes (Signed)
Pharmacy Review for Dofetilide (Tikosyn) Initiation  Admit Complaint: 82 y.o. male admitted 08/10/2015 with atrial fibrillation to be initiated on dofetilide.   Assessment:  Patient Exclusion Criteria: If any screening criteria checked as "Yes", then  patient  should NOT receive dofetilide until criteria item is corrected. If "Yes" please indicate correction plan.  YES  NO Patient  Exclusion Criteria Correction Plan  []  [x]  Baseline QTc interval is greater than or equal to 440 msec. IF above YES box checked dofetilide contraindicated unless patient has ICD; then may proceed if QTc 500-550 msec or with known ventricular conduction abnormalities may proceed with QTc 550-600 msec. QTc = 431   []  [x]  Magnesium level is less than 1.8 mEq/l : Last magnesium:  Lab Results  Component Value Date   MG 2.2 08/10/2015         []  [x]  Potassium level is less than 4 mEq/l : Last potassium:  Lab Results  Component Value Date   K 4.1 08/10/2015         []  [x]  Patient is known or suspected to have a digoxin level greater than 2 ng/ml: No results found for: DIGOXIN    []  [x]  Creatinine clearance less than 20 ml/min (calculated using Cockcroft-Gault, actual body weight and serum creatinine): Estimated Creatinine Clearance: 39.1 mL/min (by C-G formula based on Cr of 1.41).    []  [x]  Patient has received drugs known to prolong the QT intervals within the last 48 hours (phenothiazines, tricyclics or tetracyclic antidepressants, erythromycin, H-1 antihistamines, cisapride, fluoroquinolones, azithromycin). Drugs not listed above may have an, as yet, undetected potential to prolong the QT interval, updated information on QT prolonging agents is available at this website:QT prolonging agents   []  [x]  Patient received a dose of hydrochlorothiazide (Oretic) alone or in any combination including triamterene (Dyazide, Maxzide) in the last 48 hours.   []  [x]  Patient received a medication known to increase  dofetilide plasma concentrations prior to initial dofetilide dose:  . Trimethoprim (Primsol, Proloprim) in the last 36 hours . Verapamil (Calan, Verelan) in the last 36 hours or a sustained release dose in the last 72 hours . Megestrol (Megace) in the last 5 days  . Cimetidine (Tagamet) in the last 6 hours . Ketoconazole (Nizoral) in the last 24 hours . Itraconazole (Sporanox) in the last 48 hours  . Prochlorperazine (Compazine) in the last 36 hours    []  [x]  Patient is known to have a history of torsades de pointes; congenital or acquired long QT syndromes.   []  [x]  Patient has received a Class 1 antiarrhythmic with less than 2 half-lives since last dose. (Disopyramide, Quinidine, Procainamide, Lidocaine, Mexiletine, Flecainide, Propafenone)   [x]  []  Patient has received amiodarone therapy in the past 3 months or amiodarone level is greater than 0.3 ng/ml. Level < 0.1   Patient has been appropriately anticoagulated with xarelto.  Ordering provider was confirmed at [email or web address] if they are not listed on the [facility] Prescribers list.  Goal of Therapy: Follow renal function, electrolytes, potential drug interactions, and dose adjustment. Provide education and 1 week supply at discharge.  Plan:  [x]   Physician selected initial dose within range recommended for patients level of renal function - will monitor for response.  []   Physician selected initial dose outside of range recommended for patients level of renal function - will discuss if the dose should be altered at this time.   Select One Calculated CrCl  Dose q12h  []  > 60 ml/min 500 mcg  [  x] 40-60 ml/min 250 mcg  []  20-40 ml/min 125 mcg   2. Follow up QTc after the first 5 doses, renal function, electrolytes (K & Mg) daily x 3     days, dose adjustment, success of initiation and facilitate 1 week discharge supply as     clinically indicated.  3. Initiate Tikosyn education video (Call [phone] and ask for video #  [id number]).  4. Place Enrollment Form on the chart for discharge supply of dofetilide.  Levester Fresh, PharmD, BCPS, [facility] Clinical Pharmacist Pager [phone] 08/10/2015 4:55 PM

## 2015-08-10 NOTE — H&P (Signed)
H&P    Patient ID: Austin [name] MRN: [id number], DOB/AGE: 80-Mar-1932 80 y.o.  Admit date: 08/10/2015 Date of Admit: 08/10/2015   Primary Physician: Geoffery Lyons, MD Primary Cardiologist/Electrophysiologist: Dr. Curt Bears  Reason for Admission: Tikosyn initiation  HPI: Austin [name] is a 82 y.o. male with PMHx of Persistent AFib, as well as COPD, melanoma R eyelid with subsequent loss of vision, asthma, gout, OSA compliant with CPAP, CRI,, HTN present electvely to [facility] for Tiksoyn initiation.  He was seen today by [facility] who did Tikosyn counseling/teaching and evaluation.  PAFib hx: Initially evaluated in 11/16 for new onset AF with RVR. He underwent DCCV in 1/17 but reverted back to atrial fibrillation. He has been followed in the atrial fibrillation clinic. Jan 2017 his beta-blocker was changed to Carvedilol. Flecainide was started. He was seen by Dr. Curt Bears and Flecainide stopped, amiodarone started though only after a couple days the patient decided that he was quite concerned about the potential side effects of the amiodarone and would like to try Tiksoyn, last dose of amiodarone was 07/13/15.  Amio level on 07/20/15 was <0.1  Patient assures he has not missed any dose of his Xarelto since it has been started in December 2016.  Past Medical History  Diagnosis Date  . Melanoma in situ of right eyelid ([place])   . Allergy   . Asthma   . Gout   . Cough   . OSA (obstructive sleep apnea)   . CKD (chronic kidney disease) stage 3, GFR 30-59 ml/min   . Hypertensive chronic kidney disease   . BPH (benign prostatic hypertrophy)   . Persistent atrial fibrillation [facility])      Surgical History:  Past Surgical History  Procedure Laterality Date  . Eye surgery    . Cardioversion [name] 06/05/2015    Procedure: CARDIOVERSION;  Surgeon: Sanda Klein, MD;  Location: [facility] ENDOSCOPY;  Service: Cardiovascular;  Laterality: [name];     Prescriptions prior to admission  Medication  Sig Dispense Refill Last Dose  . magnesium citrate SOLN Take 1 Bottle by mouth once.     Marland Kitchen acetaminophen (TYLENOL) 500 MG tablet Take 1,000 mg by mouth daily as needed for mild pain or headache.   Taking  . albuterol (PROVENTIL) (2.5 MG/3ML) 0.083% nebulizer solution Take 2.5 mg by nebulization every 6 (six) hours as needed for wheezing or shortness of breath.   Taking  . benzonatate (TESSALON) 200 MG capsule TAKE 1 CAPSULE at bedtime as needed for cough  1 Taking  . budesonide-formoterol (SYMBICORT) 160-4.5 MCG/ACT inhaler Inhale 2 puffs into the lungs 2 (two) times daily.   Taking  . butalbital-acetaminophen-caffeine (FIORICET WITH CODEINE) 50-325-40-30 MG per capsule Take 1 capsule by mouth every 4 (four) hours as needed for headache.   Taking  . carvedilol (COREG) 6.25 MG tablet Take 2 tablets (12.5 mg total) by mouth 2 (two) times daily. 120 tablet 6 Taking  . clobetasol cream (TEMOVATE) AB-123456789 % Apply 1 application topically 2 (two) times daily as needed (for itching).    Taking  . diphenhydrAMINE (BENADRYL) 25 MG tablet Take 1 tablet (25 mg total) by mouth every 6 (six) hours as needed for itching or allergies. 20 tablet 0 Taking  . EPINEPHrine 0.15 MG/0.15ML IJ injection Inject 0.15 mLs (0.15 mg total) into the muscle as needed for anaphylaxis. 2 Device 0 Taking  . fluocinonide gel (LIDEX) AB-123456789 % Apply 1 application topically daily as needed (FOR ITCHING).    Taking  .  fluorouracil (EFUDEX) 5 % cream Apply 1 application topically daily as needed (FOR ITCHING).   0 Taking  . fluticasone (FLONASE) 50 MCG/ACT nasal spray Place 2 sprays into both nostrils at bedtime.   8 Taking  . furosemide (LASIX) 20 MG tablet Take 1 tablet (20 mg total) by mouth daily. 30 tablet 11 Taking  . gabapentin (NEURONTIN) 300 MG capsule TAKE 1 CAPSULE NIGHTLY  5 Taking  . indomethacin (INDOCIN) 50 MG capsule Take 50 mg by mouth daily as needed (for gout flare up).    Taking  . isosorbide mononitrate (IMDUR) 60 MG 24 hr  tablet Take 1 tablet (60 mg total) by mouth daily. 30 tablet 3 Taking  . LORazepam (ATIVAN) 1 MG tablet Take 1 mg by mouth at bedtime as needed for sleep. Reported on 06/02/2015   Taking  . losartan (COZAAR) 100 MG tablet Take 1 tablet (100 mg total) by mouth daily. 30 tablet 3 Taking  . potassium chloride SA (K-DUR,KLOR-CON) 20 MEQ tablet Take 1 tablet (20 mEq total) by mouth daily. 30 tablet 11 Taking  . Rivaroxaban (XARELTO) 15 MG TABS tablet Take 1 tablet (15 mg total) by mouth daily with supper. 90 tablet 3 Taking  . SYMBICORT 80-4.5 MCG/ACT inhaler Inhale 2 puffs into the lungs 2 (two) times daily.   Taking  . valACYclovir (VALTREX) 1000 MG tablet Take 1,000 mg by mouth daily.   Taking  . White Petrolatum-Mineral Oil (SYSTANE NIGHTTIME OP) Place 1 drop into the right eye 3 (three) times daily as needed (for dry eyes).    Taking  . zolpidem (AMBIEN) 10 MG tablet Take 10 mg by mouth at bedtime as needed for sleep. for sleep  5 Taking    Inpatient Medications:   Allergies:  Allergies  Allergen Reactions  . Diltiazem Other (See Comments)    Severe headaches 120 mg BID  . Codeine Nausea And Vomiting  . Ivp Dye [Iodinated Diagnostic Agents] Rash  . Penicillins Swelling and Rash    Social History   Social History  . Marital Status: Married    Spouse Name: [name]  . Number of Children: [name]  . Years of Education: [name]   Occupational History  . Not on file.   Social History Main Topics  . Smoking status: Never Smoker   . Smokeless tobacco: Never Used  . Alcohol Use: 0.6 oz/week    1 Standard drinks or equivalent per week     Comment: occ  . Drug Use: No  . Sexual Activity: Not on file   Other Topics Concern  . Not on file   Social History Narrative   Right Handed.  Caffiene  Coffee, tea 3 cups daily.   Pt retired.     Lives at home.       Family History  Problem Relation Age of Onset  . Hypertension Father   . Lung cancer Father      Review of Systems: All other  systems reviewed and are otherwise negative except as noted above.  Physical Exam: Filed Vitals:   08/10/15 1441  BP: 129/94  Pulse: 72  Temp: 97.7 F (36.5 C)  TempSrc: Oral  Resp: 20  SpO2: 96%    GEN- The patient is elderly well appearing, alert and oriented x 3 today.   HEENT: normocephalic, atraumatic; sclera clear, conjunctiva pink; hearing intact; oropharynx clear; neck supple, no JVP Lymph- no cervical lymphadenopathy Lungs- Clear to ausculation bilaterally, normal work of breathing.  No wheezes, rales, rhonchi Heart-  irregular rate and rhythm, no significant murmurs, rubs or gallops, PMI not laterally displaced GI- soft, non-tender, non-distended Extremities- no clubbing, cyanosis, or edema MS- no significant deformity or atrophy Skin- warm and dry, no rash or lesion Psych- euthymic mood, full affect Neuro- no gross deficits observed  Labs:   Lab Results  Component Value Date   WBC 8.5 06/01/2015   HGB 14.4 06/01/2015   HCT 42.8 06/01/2015   MCV 92.8 06/01/2015   PLT 344 06/01/2015    Recent Labs Lab 08/10/15 1039  NA 142  K 4.1  CL 108  CO2 24  BUN 17  CREATININE 1.41*  CALCIUM 9.0  GLUCOSE 92      Radiology/Studies: No results found.  EKG: AFib 78bpm, QTc 444 TELEMETRY: AFib 70-80's  Additional studies/ records that were reviewed today include: TTE 05/18/15  Review of the above records today demonstrates:  - LVEF 60-65%, moderate LVH, normal wall motion , calcified aortic valve with mild regurgitation, upper normal LA size, mild TR, RVSP 37 mmHg, lipomatous hypertrophy of the IAS, normal IVC. LA 66mm  ETT 06/29/15  Nuclear stress EF: 55%.  There was no ST segment deviation noted during stress. Patient was in atrial fibrillation throughout the study.  There is a small defect of mild severity present in the basal inferolateral and mid inferolateral location. The defect is non-reversible. There is a small defect of mild severity present  in the basal inferior and mid inferior location. The defect is non-reversible.There is a small defect of mild severity present in the apex location. The defect is non-reversible. No ischemia noted.  This is a low risk study.  The left ventricular ejection fraction is normal (55-65%).     Assessment and Plan:   1. Tikosyn initiation     Persistent AFib     CHA2DS2Vasc is 3 on Xarelto     K+ 4.1     Mag 2.2     Creat 1.41, with calc. Creat Cl 44.57 will be starting 266mcg BID     QTc is reviewed with Dr. Lovena Le, [place] to start  2. HTN         Signed, Jodelle Green 08/10/2015 2:51 PM  EP attending  Patient seen and examined. I agree with the findings as noted above. Cardiovascular exam reveals an irregularly irregular rhythm and his lungs are clear with no significant peripheral edema. The patient is a very pleasant 82 year old man admitted with atrial fibrillation for initiation of dofetilide therapy. His QT interval is borderline but acceptable. His blood pressure is reasonably well controlled. He will continue his current medical therapy and initiate dofetilide 250 g twice a day. We'll follow his EKGs and labs serially. He will be maintained on telemetry. We will plan DC cardioversion in the next couple of days if he does not convert back to sinus initially.  Cristopher Peru, M.D.

## 2015-08-11 ENCOUNTER — Encounter (HOSPITAL_COMMUNITY): Payer: Self-pay

## 2015-08-11 ENCOUNTER — Ambulatory Visit: Payer: Medicare PPO | Admitting: Pharmacist

## 2015-08-11 LAB — BASIC METABOLIC PANEL
Anion gap: 9 (ref 5–15)
BUN: 14 mg/dL (ref 6–20)
CALCIUM: 8.7 mg/dL — AB (ref 8.9–10.3)
CO2: 27 mmol/L (ref 22–32)
CREATININE: 1.22 mg/dL (ref 0.61–1.24)
Chloride: 107 mmol/L (ref 101–111)
GFR calc Af Amer: >60 mL/min (ref >60)
GFR, EST NON AFRICAN AMERICAN: 53 mL/min — AB (ref >60)
GLUCOSE: 95 mg/dL (ref 65–99)
Potassium: 3.4 mmol/L — ABNORMAL LOW (ref 3.5–5.1)
Sodium: 143 mmol/L (ref 135–145)

## 2015-08-11 LAB — MAGNESIUM: MAGNESIUM: 2 mg/dL (ref 1.7–2.4)

## 2015-08-11 MED ORDER — SODIUM CHLORIDE 0.9% FLUSH
3.0000 mL | Freq: Two times a day (BID) | INTRAVENOUS | Status: DC
  Administered 2015-08-12 – 2015-08-13 (×2): 3 mL via INTRAVENOUS

## 2015-08-11 MED ORDER — SODIUM CHLORIDE 0.9% FLUSH: 3.0000 mL | INTRAVENOUS | Status: DC | PRN

## 2015-08-11 MED ORDER — CARVEDILOL 25 MG PO TABS
25.0000 mg | ORAL_TABLET | Freq: Two times a day (BID) | ORAL | Status: DC
  Administered 2015-08-11 – 2015-08-13 (×4): 25 mg via ORAL
  Filled 2015-08-11 (×4): qty 1

## 2015-08-11 MED ORDER — SODIUM CHLORIDE 0.9 % IV SOLN: 250.0000 mL | INTRAVENOUS | Status: DC

## 2015-08-11 MED ORDER — POTASSIUM CHLORIDE CRYS ER 20 MEQ PO TBCR
40.0000 meq | EXTENDED_RELEASE_TABLET | Freq: Two times a day (BID) | ORAL | Status: AC
  Administered 2015-08-11 (×2): 40 meq via ORAL
  Filled 2015-08-11 (×2): qty 2

## 2015-08-11 NOTE — Plan of Care (Signed)
Problem: Activity: Goal: Ability to tolerate increased activity will improve Outcome: Completed/Met Date Met:  08/11/15 Patient is up and walking the halls with no assistance. Tolerating very well.

## 2015-08-11 NOTE — Progress Notes (Signed)
Confirmed with Dr. Curt Bears that it was ok to administer 0800 Tikosyn with 3.4 K while also giving PO supplement potassium.

## 2015-08-11 NOTE — Progress Notes (Signed)
SUBJECTIVE: The patient is doing well today.  At this time, he denies chest pain, shortness of breath, or any new concerns.  . carvedilol  12.5 mg Oral BID WC  . clobetasol cream  1 application Topical Daily  . dofetilide  250 mcg Oral BID  . fluticasone  2 spray Each Nare QHS  . furosemide  20 mg Oral Daily  . gabapentin  300 mg Oral QHS  . isosorbide mononitrate  60 mg Oral Daily  . losartan  100 mg Oral Daily  . magnesium oxide  400 mg Oral Daily  . mometasone-formoterol  2 puff Inhalation BID  . potassium chloride SA  40 mEq Oral BID  . rivaroxaban  15 mg Oral Q supper  . sodium chloride flush  3 mL Intravenous Q12H  . valACYclovir  1,000 mg Oral QPC supper  . zolpidem  5 mg Oral QHS      OBJECTIVE: Physical Exam: Filed Vitals:   08/10/15 1441 08/10/15 2137 08/11/15 0519  BP: 129/94 167/99 158/105  Pulse: 72 77 81  Temp: 97.7 F (36.5 C) 98 F (36.7 C) 97.6 F (36.4 C)  TempSrc: Oral Oral Oral  Resp: 20    Weight:   162 lb 3.2 oz (73.573 kg)  SpO2: 96% 96% 98%    Intake/Output Summary (Last 24 hours) at 08/11/15 0815 Last data filed at 08/10/15 1847  Gross per 24 hour  Intake    240 ml  Output      0 ml  Net    240 ml    Telemetry reveals sinus rhythm  GEN- The patient is well appearing, alert and oriented x 3 today.   Head- normocephalic, atraumatic Eyes-  Sclera clear, conjunctiva pink Ears- hearing intact Oropharynx- clear Neck- supple, no JVP Lymph- no cervical lymphadenopathy Lungs- Clear to ausculation bilaterally, normal work of breathing Heart- irregular, no murmurs, rubs or gallops, PMI not laterally displaced GI- soft, NT, ND, + BS Extremities- no clubbing, cyanosis, or edema Skin- no rash or lesion Psych- euthymic mood, full affect Neuro- strength and sensation are intact  LABS: Basic Metabolic Panel:  Recent Labs  08/10/15 1039 08/11/15 0237  NA 142 143  K 4.1 3.4*  CL 108 107  CO2 24 27  GLUCOSE 92 95  BUN 17 14    CREATININE 1.41* 1.22  CALCIUM 9.0 8.7*  MG 2.2 2.0   Liver Function Tests: No results for input(s): AST, ALT, ALKPHOS, BILITOT, PROT, ALBUMIN in the last 72 hours. No results for input(s): LIPASE, AMYLASE in the last 72 hours. CBC: No results for input(s): WBC, NEUTROABS, HGB, HCT, MCV, PLT in the last 72 hours. Cardiac Enzymes: No results for input(s): CKTOTAL, CKMB, CKMBINDEX, TROPONINI in the last 72 hours. BNP: Invalid input(s): POCBNP D-Dimer: No results for input(s): DDIMER in the last 72 hours. Hemoglobin A1C: No results for input(s): HGBA1C in the last 72 hours. Fasting Lipid Panel: No results for input(s): CHOL, HDL, LDLCALC, TRIG, CHOLHDL, LDLDIRECT in the last 72 hours. Thyroid Function Tests: No results for input(s): TSH, T4TOTAL, T3FREE, THYROIDAB in the last 72 hours.  Invalid input(s): FREET3 Anemia Panel: No results for input(s): VITAMINB12, FOLATE, FERRITIN, TIBC, IRON, RETICCTPCT in the last 72 hours.  RADIOLOGY: No results found.  ASSESSMENT AND PLAN:  Active Problems:   Atrial fibrillation, persistent ([place]) 1. Tikosyn initiation  Persistent AFib  CHA2DS2Vasc is 3 on Xarelto  K+ being repleated  Mag 2.2  Creat 1.41, with calc. Creat Cl 44.57 Dartanion Teo be  starting 266mcg BID  May need cardioversion tomorrow.  Tele monitor not currently working, Masen Salvas check later today.  2. HTN: elevated on AM check prior to home BP meds.  Ferry Matthis continue to follow.  Shantella Blubaugh Meredith Leeds, MD 08/11/2015 8:15 AM

## 2015-08-11 NOTE — Care Management Note (Addendum)
Case Management Note  Patient Details  Name: Austin [name] MRN: [id number] Date of Birth: [birth year]  Subjective/Objective:    Pt admitted for Tikosyn Load.                 Action/Plan: CM will call patient's pharmacy to make sure medication is available. CM will assist with 7 day supply via [place]. Pt will need Rx for 7 day supply no refills and then the original Rx with refills. Benefits check in process for cost. [facility] [address] and medication can be ordered. No further needs at this time.   Expected Discharge Date:                  Expected Discharge Plan:  Home/Self Care  In-House Referral:  NA  Discharge planning Services  CM Consult, Medication Assistance  Post Acute Care Choice:  NA Choice offered to:  NA  DME Arranged:  [name] DME Agency:  NA  HH Arranged:  NA HH Agency:  NA  Status of Service:  Completed, signed off  Medicare Important Message Given:    Date Medicare IM Given:    Medicare IM give by:    Date Additional Medicare IM Given:    Additional Medicare Important Message give by:     If discussed at [place] of Stay Meetings, dates discussed:    Additional Comments: 1206 08-11-15 Jacqlyn Krauss, RN,BSN [phone] S/W DEJA @ [facility] # [phone]   TIKOSYN 250 MCG 2 X A DAY 30 DAY SUPPLY   COVER- YES  CO-PAY- # 131.32  PRIOR APPROVAL - YES # [phone]  [facility]   DOFETILIDE 250 MCG 2 X A DAY 30 DAY SUPPLY   COVER- YES  CO-PAY- $ 38.54  PRIOR APPROVAL -YES # [phone]  [facility]   Bethena Roys, RN 08/11/2015, 10:02 AM

## 2015-08-12 ENCOUNTER — Encounter (HOSPITAL_COMMUNITY)
Admission: AD | Disposition: A | Discharge status: Home / Self Care | Payer: Self-pay | Source: Ambulatory Visit | Attending: Cardiology

## 2015-08-12 LAB — BASIC METABOLIC PANEL
ANION GAP: 9 (ref 5–15)
BUN: 17 mg/dL (ref 6–20)
CO2: 28 mmol/L (ref 22–32)
Calcium: 9 mg/dL (ref 8.9–10.3)
Chloride: 107 mmol/L (ref 101–111)
Creatinine, Ser: 1.37 mg/dL — ABNORMAL HIGH (ref 0.61–1.24)
GFR, EST AFRICAN AMERICAN: 54 mL/min — AB (ref >60)
GFR, EST NON AFRICAN AMERICAN: 46 mL/min — AB (ref >60)
GLUCOSE: 102 mg/dL — AB (ref 65–99)
POTASSIUM: 4.3 mmol/L (ref 3.5–5.1)
Sodium: 144 mmol/L (ref 135–145)

## 2015-08-12 LAB — MAGNESIUM: MAGNESIUM: 2.1 mg/dL (ref 1.7–2.4)

## 2015-08-12 SURGERY — CARDIOVERSION (CATH LAB)

## 2015-08-12 SURGERY — CARDIOVERSION
Anesthesia: Monitor Anesthesia Care

## 2015-08-12 MED ORDER — AMLODIPINE BESYLATE 5 MG PO TABS
5.0000 mg | ORAL_TABLET | Freq: Every day | ORAL | Status: DC
  Administered 2015-08-12: 5 mg via ORAL
  Filled 2015-08-12: qty 1

## 2015-08-12 MED ORDER — DOFETILIDE 125 MCG PO CAPS
125.0000 ug | ORAL_CAPSULE | Freq: Two times a day (BID) | ORAL | Status: DC
  Administered 2015-08-12 – 2015-08-13 (×2): 125 ug via ORAL
  Filled 2015-08-12 (×2): qty 1

## 2015-08-12 NOTE — Progress Notes (Addendum)
SUBJECTIVE: The patient is doing well today.  At this time, he denies chest pain, shortness of breath, or any new concerns.    . carvedilol  25 mg Oral BID WC  . clobetasol cream  1 application Topical Daily  . dofetilide  250 mcg Oral BID  . fluticasone  2 spray Each Nare QHS  . furosemide  20 mg Oral Daily  . gabapentin  300 mg Oral QHS  . isosorbide mononitrate  60 mg Oral Daily  . losartan  100 mg Oral Daily  . magnesium oxide  400 mg Oral Daily  . mometasone-formoterol  2 puff Inhalation BID  . rivaroxaban  15 mg Oral Q supper  . sodium chloride flush  3 mL Intravenous Q12H  . sodium chloride flush  3 mL Intravenous Q12H  . valACYclovir  1,000 mg Oral QPC supper  . zolpidem  5 mg Oral QHS   . sodium chloride      OBJECTIVE: Physical Exam: Filed Vitals:   08/11/15 1058 08/11/15 1405 08/11/15 2255 08/12/15 0626  BP: 163/101 139/94 151/88 155/99  Pulse:  74 58 80  Temp:   98 F (36.7 C) 97.8 F (36.6 C)  TempSrc:   Oral Oral  Resp:  18 18 20   Height:  5\' 8"  (1.727 m)    Weight:    163 lb 12.8 oz (74.3 kg)  SpO2:  98% 100% 98%    Intake/Output Summary (Last 24 hours) at 08/12/15 1213 Last data filed at 08/12/15 1100  Gross per 24 hour  Intake   1083 ml  Output      0 ml  Net   1083 ml    Telemetry reveals sinus rhythm  GEN- The patient is well appearing, alert and oriented x 3 today.   Head- normocephalic, atraumatic Eyes-  Sclera clear, conjunctiva pink Ears- hearing intact Oropharynx- clear Neck- supple, no JVP Lymph- no cervical lymphadenopathy Lungs- Clear to ausculation bilaterally, normal work of breathing Heart- irregular, no murmurs, rubs or gallops, PMI not laterally displaced GI- soft, NT, ND, + BS Extremities- no clubbing, cyanosis, or edema Skin- no rash or lesion Psych- euthymic mood, full affect Neuro- strength and sensation are intact  LABS: Basic Metabolic Panel:  Recent Labs  08/11/15 0237 08/12/15 0400  NA 143 144  K 3.4*  4.3  CL 107 107  CO2 27 28  GLUCOSE 95 102*  BUN 14 17  CREATININE 1.22 1.37*  CALCIUM 8.7* 9.0  MG 2.0 2.1   Liver Function Tests: No results for input(s): AST, ALT, ALKPHOS, BILITOT, PROT, ALBUMIN in the last 72 hours. No results for input(s): LIPASE, AMYLASE in the last 72 hours. CBC: No results for input(s): WBC, NEUTROABS, HGB, HCT, MCV, PLT in the last 72 hours. Cardiac Enzymes: No results for input(s): CKTOTAL, CKMB, CKMBINDEX, TROPONINI in the last 72 hours. BNP: Invalid input(s): POCBNP D-Dimer: No results for input(s): DDIMER in the last 72 hours. Hemoglobin A1C: No results for input(s): HGBA1C in the last 72 hours. Fasting Lipid Panel: No results for input(s): CHOL, HDL, LDLCALC, TRIG, CHOLHDL, LDLDIRECT in the last 72 hours. Thyroid Function Tests: No results for input(s): TSH, T4TOTAL, T3FREE, THYROIDAB in the last 72 hours.  Invalid input(s): FREET3 Anemia Panel: No results for input(s): VITAMINB12, FOLATE, FERRITIN, TIBC, IRON, RETICCTPCT in the last 72 hours.  RADIOLOGY: No results found.  ASSESSMENT AND PLAN:  Active Problems:   Atrial fibrillation, persistent ([place]) 1. Tikosyn initiation  Persistent AFib  CHA2DS2Vasc is 3  on Xarelto  K+ being repleated  Mag 2.2  Creat 1.41, with calc. Creat Cl 44.57 continue 269mcg BID     2. HTN: elevated on AM check prior to home BP meds.  Magdeline Prange add 5 of norvasc.  Herbie Lehrmann Meredith Leeds, MD 08/12/2015 12:13 PM

## 2015-08-12 NOTE — Progress Notes (Signed)
Post Tikosyn EKG reviewed with Dr. Curt Bears, QT prolongation noted Tikosyn dose decreased to 172mcg BID.  Tommye Standard, PA-C

## 2015-08-12 NOTE — Discharge Instructions (Addendum)
Information on my medicine - XARELTO (Rivaroxaban)  Why was Xarelto prescribed for you? Xarelto was prescribed for you to reduce the risk of a blood clot forming that can cause a stroke if you have a medical condition called atrial fibrillation (a type of irregular heartbeat).  What do you need to know about xarelto ? Take your Xarelto ONCE DAILY at the same time every day with your evening meal. If you have difficulty swallowing the tablet whole, you may crush it and mix in applesauce just prior to taking your dose.  Take Xarelto exactly as prescribed by your doctor and DO NOT stop taking Xarelto without talking to the doctor who prescribed the medication.  Stopping without other stroke prevention medication to take the place of Xarelto may increase your risk of developing a clot that causes a stroke.  Refill your prescription before you run out.  After discharge, you should have regular check-up appointments with your healthcare provider that is prescribing your Xarelto.  In the future your dose may need to be changed if your kidney function or weight changes by a significant amount.  What do you do if you miss a dose? If you are taking Xarelto ONCE DAILY and you miss a dose, take it as soon as you remember on the same day then continue your regularly scheduled once daily regimen the next day. Do not take two doses of Xarelto at the same time or on the same day.   Important Safety Information A possible side effect of Xarelto is bleeding. You should call your healthcare provider right away if you experience any of the following: ? Bleeding from an injury or your nose that does not stop. ? Unusual colored urine (red or dark brown) or unusual colored stools (red or black). ? Unusual bruising for unknown reasons. ? A serious fall or if you hit your head (even if there is no bleeding).  Some medicines may interact with Xarelto and might increase your risk of bleeding while on  Xarelto. To help avoid this, consult your healthcare provider or pharmacist prior to using any new prescription or non-prescription medications, including herbals, vitamins, non-steroidal anti-inflammatory drugs (NSAIDs) and supplements.  This website has more information on Xarelto: [email or web address]  You have an appointment set up with the [facility].  Multiple studies have shown that being followed by a dedicated atrial fibrillation clinic in addition to the standard care you receive from your other physicians improves health. We believe that enrollment in the atrial fibrillation clinic will allow [place] to better care for you.   The phone number to the [facility] is [phone]. The clinic is staffed Monday through Friday from 8:30am to 5pm.  Parking Directions: The clinic is located in the Heart and Vascular Building connected to [facility]. 1)From [address] turn on to [address] and go to the 3rd entrance  (Heart and Vascular entrance) on the right. 2)Look to the right for Heart &Vascular Parking Garage. 3)A code for the entrance is required please call the clinic to receive this.   4)Take the elevators to the 1st floor. Registration is in the room with the glass walls at the end of the hallway.  If you have any trouble parking or locating the clinic, please dont hesitate to call [phone].  Dofetilide capsules What is this medicine? DOFETILIDE (doe FET il ide) is an antiarrhythmic drug. It helps make your heart beat regularly. This medicine also helps to slow rapid heartbeats. This medicine  may be used for other purposes; ask your health care provider or pharmacist if you have questions. What should I tell my health care provider before I take this medicine? They need to know if you have any of these conditions: -heart disease -history of low levels of potassium or magnesium -kidney disease -liver disease -an unusual or allergic  reaction to dofetilide, other medicines, foods, dyes, or preservatives -pregnant or trying to get pregnant -breast-feeding How should I use this medicine? Take this medicine by mouth with a glass of water. Follow the directions on the prescription label. You can take this medicine with or without food. Do not drink grapefruit juice with this medicine. Take your doses at regular intervals. Do not take your medicine more often than directed. Do not stop taking this medicine suddenly. This may cause serious, heart-related side effects. Your doctor will tell you how much medicine to take. If your doctor wants you to stop the medicine, the dose will be slowly lowered over time to avoid any side effects. Talk to your pediatrician regarding the use of this medicine in children. Special care may be needed. Overdosage: If you think you have taken too much of this medicine contact a poison control center or emergency room at once. NOTE: This medicine is only for you. Do not share this medicine with others. What if I miss a dose? If you miss a dose, take it as soon as you can. If it is almost time for your next dose, take only that dose. Do not take double or extra doses. What may interact with this medicine? Do not take this medicine with any of the following medications: -cimetidine -dolutegravir -hydrochlorothiazide alone or in combination with other medicines -ketoconazole -megestrol -other medicines that prolong the QT interval (cause an abnormal heart rhythm) -prochlorperazine -trimethoprim alone or in combination with sulfamethoxazole -verapamil This medicine may also interact with the following medications: -amiloride -certain antidepressants like fluvoxamine or paroxetine -certain antiviral medicines for HIV or AIDS like atazanavir or darunavir -certain medicines for fungal infections like clotrimazole or miconazole -digoxin -diltiazem -dronabinol, THC -grapefruit  juice -metformin -nefazodone -triamterene -zafirlukast This list may not describe all possible interactions. Give your health care provider a list of all the medicines, herbs, non-prescription drugs, or dietary supplements you use. Also tell them if you smoke, drink alcohol, or use illegal drugs. Some items may interact with your medicine. What should I watch for while using this medicine? Visit your doctor or health care professional for regular checks on your progress. Wear a medical ID bracelet or chain, and carry a card that describes your disease and details of your medicine and dosage times. Check your heart rate and blood pressure regularly while you are taking this medicine. Ask your doctor or health care professional what your heart rate and blood pressure should be, and when you should contact him or her. Your doctor or health care professional also may schedule regular tests to check your progress. You will be started on this medicine in a specialized facility for at least three days. You will be monitored to find the right dose of medicine for you. It is very important that you take your medicine exactly as prescribed when you leave the hospital. The correct dosing of this medicine is very important to treat your condition and prevent possible serious side effects. What side effects may I notice from receiving this medicine? Side effects that you should report to your doctor or health care professional as soon as  possible: -allergic reactions like skin rash, itching or hives, swelling of the face, lips, or tongue -breathing problems -dizziness -fast or rapid beating of the heart -feeling faint or lightheaded -swelling of the ankles -unusually weak or tired -vomiting Side effects that usually do not require medical attention (report to your doctor or health care professional if they continue or are bothersome): -cough -diarrhea -difficulty sleeping -headache -nausea -stomach  pain This list may not describe all possible side effects. Call your doctor for medical advice about side effects. You may report side effects to FDA at 1-800-FDA-1088. Where should I keep my medicine? Keep out of the reach of children. Store at room temperature between 15 and 30 degrees C (59 and 86 degrees F). Protect the medicine from moisture or humidity. Keep container tightly closed. Throw away any unused medicine after the expiration date. NOTE: This sheet is a summary. It may not cover all possible information. If you have questions about this medicine, talk to your doctor, pharmacist, or health care provider.    2016, Elsevier/[employer] Standard. (2013-12-23 16:21:18)   Atrial Fibrillation Atrial fibrillation is a type of heartbeat that is irregular or fast (rapid). If you have this condition, your heart keeps quivering in a weird (chaotic) way. This condition can make it so your heart cannot pump blood normally. Having this condition gives a person more risk for stroke, heart failure, and other heart problems. There are different types of atrial fibrillation. Talk with your doctor to learn about the type that you have. HOME CARE  Take over-the-counter and prescription medicines only as told by your doctor.  If your doctor prescribed a blood-thinning medicine, take it exactly as told. Taking too much of it can cause bleeding. If you do not take enough of it, you will not have the protection that you need against stroke and other problems.  Do not use any tobacco products. These include cigarettes, chewing tobacco, and e-cigarettes. If you need help quitting, ask your doctor.  If you have apnea (obstructive sleep apnea), manage it as told by your doctor.  Do not drink alcohol.  Do not drink beverages that have caffeine. These include coffee, soda, and tea.  Maintain a healthy weight. Do not use diet pills unless your doctor says they are safe for you. Diet pills may make heart problems  worse.  Follow diet instructions as told by your doctor.  Exercise regularly as told by your doctor.  Keep all follow-up visits as told by your doctor. This is important. GET HELP IF:  You notice a change in the speed, rhythm, or strength of your heartbeat.  You are taking a blood-thinning medicine and you notice more bruising.  You get tired more easily when you move or exercise. GET HELP RIGHT AWAY IF:  You have pain in your chest or your belly (abdomen).  You have sweating or weakness.  You feel sick to your stomach (nauseous).  You notice blood in your throw up (vomit), poop (stool), or pee (urine).  You are short of breath.  You suddenly have swollen feet and ankles.  You feel dizzy.  Your suddenly get weak or numb in your face, arms, or legs, especially if it happens on one side of your body.  You have trouble talking, trouble understanding, or both.  Your face or your eyelid droops on one side. These symptoms may be an emergency. Do not wait to see if the symptoms will go away. Get medical help right away. Call your local  emergency services (911 in the [place] Do not drive yourself to the hospital.   This information is not intended to replace advice given to you by your health care provider. Make sure you discuss any questions you have with your health care provider.   Document Released: 02/09/2008 Document Revised: 01/21/2015 Document Reviewed: 08/27/2014 Elsevier Interactive Patient Education 2016 [place].   Heart-Healthy Eating Plan Many factors influence your heart health, including eating and exercise habits. Heart (coronary) risk increases with abnormal blood fat (lipid) levels. Heart-healthy meal planning includes limiting unhealthy fats, increasing healthy fats, and making other small dietary changes. This includes maintaining a healthy body weight to help keep lipid levels within a normal range. WHAT IS MY PLAN?  Your health care provider recommends  that you:  Get no more than _________% of the total calories in your daily diet from fat.  Limit your intake of saturated fat to less than _________% of your total calories each day.  Limit the amount of cholesterol in your diet to less than _________ mg per day. WHAT TYPES OF FAT SHOULD I CHOOSE?  Choose healthy fats more often. Choose monounsaturated and polyunsaturated fats, such as olive oil and canola oil, flaxseeds, walnuts, almonds, and seeds.  Eat more omega-3 fats. Good choices include salmon, mackerel, sardines, tuna, flaxseed oil, and ground flaxseeds. Aim to eat fish at least two times each week.  Limit saturated fats. Saturated fats are primarily found in animal products, such as meats, butter, and cream. Plant sources of saturated fats include palm oil, palm kernel oil, and coconut oil.  Avoid foods with partially hydrogenated oils in them. These contain trans fats. Examples of foods that contain trans fats are stick margarine, some tub margarines, cookies, crackers, and other baked goods. WHAT GENERAL GUIDELINES DO I NEED TO FOLLOW?  Check food labels carefully to identify foods with trans fats or high amounts of saturated fat.  Fill one half of your plate with vegetables and green salads. Eat 4-5 servings of vegetables per day. A serving of vegetables equals 1 cup of raw leafy vegetables,  cup of raw or cooked cut-up vegetables, or  cup of vegetable juice.  Fill one fourth of your plate with whole grains. Look for the word "whole" as the first word in the ingredient list.  Fill one fourth of your plate with lean protein foods.  Eat 4-5 servings of fruit per day. A serving of fruit equals one medium whole fruit,  cup of dried fruit,  cup of fresh, frozen, or canned fruit, or  cup of 100% fruit juice.  Eat more foods that contain soluble fiber. Examples of foods that contain this type of fiber are apples, broccoli, carrots, beans, peas, and barley. Aim to get 20-30 g of  fiber per day.  Eat more home-cooked food and less restaurant, buffet, and fast food.  Limit or avoid alcohol.  Limit foods that are high in starch and sugar.  Avoid fried foods.  Cook foods by using methods other than frying. Baking, boiling, grilling, and broiling are all great options. Other fat-reducing suggestions include:  Removing the skin from poultry.  Removing all visible fats from meats.  Skimming the fat off of stews, soups, and gravies before serving them.  Steaming vegetables in water or broth.  Lose weight if you are overweight. Losing just 5-10% of your initial body weight can help your overall health and prevent diseases such as diabetes and heart disease.  Increase your consumption of nuts, legumes,  and seeds to 4-5 servings per week. One serving of dried beans or legumes equals  cup after being cooked, one serving of nuts equals 1 ounces, and one serving of seeds equals  ounce or 1 tablespoon.  You may need to monitor your salt (sodium) intake, especially if you have high blood pressure. Talk with your health care provider or dietitian to get more information about reducing sodium. WHAT FOODS CAN I EAT? Grains Breads, including [place], white, pita, wheat, raisin, rye, oatmeal, and [place]. Tortillas that are neither fried nor made with lard or trans fat. Low-fat rolls, including hotdog and hamburger buns and English muffins. Biscuits. Muffins. Waffles. Pancakes. Light popcorn. Whole-grain cereals. Flatbread. Melba toast. Pretzels. Breadsticks. Rusks. Low-fat snacks and crackers, including oyster, saltine, matzo, graham, animal, and rye. Rice and pasta, including brown rice and those that are made with whole wheat. Vegetables All vegetables. Fruits All fruits, but limit coconut. Meats and Other Protein Sources Lean, well-trimmed beef, veal, pork, and lamb. Chicken and [place] without skin. All fish and shellfish. Wild duck, rabbit, pheasant, and venison. Egg  whites or low-cholesterol egg substitutes. Dried beans, peas, lentils, and tofu.Seeds and most nuts. Dairy Low-fat or nonfat cheeses, including ricotta, string, and mozzarella. Skim or 1% milk that is liquid, powdered, or evaporated. Buttermilk that is made with low-fat milk. Nonfat or low-fat yogurt. Beverages Mineral water. Diet carbonated beverages. Sweets and Desserts Sherbets and fruit ices. Honey, jam, marmalade, jelly, and syrups. Meringues and gelatins. Pure sugar candy, such as hard candy, jelly beans, gumdrops, mints, marshmallows, and small amounts of dark chocolate. [employer] Eat all sweets and desserts in moderation. Fats and Oils Nonhydrogenated (trans-free) margarines. Vegetable oils, including soybean, sesame, sunflower, olive, peanut, safflower, corn, canola, and cottonseed. Salad dressings or mayonnaise that are made with a vegetable oil. Limit added fats and oils that you use for cooking, baking, salads, and as spreads. Other Cocoa powder. Coffee and tea. All seasonings and condiments. The items listed above may not be a complete list of recommended foods or beverages. Contact your dietitian for more options. WHAT FOODS ARE NOT RECOMMENDED? Grains Breads that are made with saturated or trans fats, oils, or whole milk. Croissants. Butter rolls. Cheese breads. Sweet rolls. Donuts. Buttered popcorn. Chow mein noodles. High-fat crackers, such as cheese or butter crackers. Meats and Other Protein Sources Fatty meats, such as hotdogs, short ribs, sausage, spareribs, bacon, ribeye roast or steak, and mutton. High-fat deli meats, such as salami and bologna. Caviar. Domestic duck and goose. Organ meats, such as kidney, liver, sweetbreads, brains, gizzard, chitterlings, and heart. Dairy Cream, sour cream, cream cheese, and creamed cottage cheese. Whole milk cheeses, including blue (bleu), Monte[place] Jack, [place], [place], American, [place], Swiss, [place], [place], and [place].  Whole or 2% milk that is liquid, evaporated, or condensed. Whole buttermilk. Cream sauce or high-fat cheese sauce. Yogurt that is made from whole milk. Beverages Regular sodas and drinks with added sugar. Sweets and Desserts Frosting. Pudding. Cookies. Cakes other than angel food cake. Candy that has milk chocolate or white chocolate, hydrogenated fat, butter, coconut, or unknown ingredients. Buttered syrups. Full-fat ice cream or ice cream drinks. Fats and Oils Gravy that has suet, meat fat, or shortening. Cocoa butter, hydrogenated oils, palm oil, coconut oil, palm kernel oil. These can often be found in baked products, candy, fried foods, nondairy creamers, and whipped toppings. Solid fats and shortenings, including bacon fat, salt pork, lard, and butter. Nondairy cream substitutes, such as coffee creamers and sour  cream substitutes. Salad dressings that are made of unknown oils, cheese, or sour cream. The items listed above may not be a complete list of foods and beverages to avoid. Contact your dietitian for more information.   This information is not intended to replace advice given to you by your health care provider. Make sure you discuss any questions you have with your health care provider.   Document Released: 02/09/2008 Document Revised: 05/23/2014 Document Reviewed: 10/24/2013 Elsevier Interactive Patient Education [employer].

## 2015-08-13 LAB — BASIC METABOLIC PANEL
Anion gap: 9 (ref 5–15)
BUN: 16 mg/dL (ref 6–20)
CO2: 27 mmol/L (ref 22–32)
Calcium: 8.8 mg/dL — ABNORMAL LOW (ref 8.9–10.3)
Chloride: 107 mmol/L (ref 101–111)
Creatinine, Ser: 1.29 mg/dL — ABNORMAL HIGH (ref 0.61–1.24)
GFR calc Af Amer: 58 mL/min — ABNORMAL LOW (ref >60)
GFR, EST NON AFRICAN AMERICAN: 50 mL/min — AB (ref >60)
Glucose, Bld: 97 mg/dL (ref 65–99)
POTASSIUM: 3.7 mmol/L (ref 3.5–5.1)
SODIUM: 143 mmol/L (ref 135–145)

## 2015-08-13 LAB — MAGNESIUM: MAGNESIUM: 2 mg/dL (ref 1.7–2.4)

## 2015-08-13 MED ORDER — DOFETILIDE 125 MCG PO CAPS: 125.0000 ug | ORAL_CAPSULE | Freq: Two times a day (BID) | ORAL | Status: DC

## 2015-08-13 MED ORDER — CARVEDILOL 25 MG PO TABS: 25.0000 mg | ORAL_TABLET | Freq: Two times a day (BID) | ORAL | Status: DC

## 2015-08-13 MED ORDER — POTASSIUM CHLORIDE CRYS ER 20 MEQ PO TBCR
40.0000 meq | EXTENDED_RELEASE_TABLET | Freq: Every day | ORAL | Status: DC
  Administered 2015-08-13: 40 meq via ORAL
  Filled 2015-08-13: qty 2

## 2015-08-13 NOTE — Progress Notes (Signed)
   SUBJECTIVE: The patient is doing well today. At this time, he denies chest pain, shortness of breath, or any new concerns.    Marland Kitchen amLODipine  5 mg Oral Daily  . carvedilol  25 mg Oral BID WC  . clobetasol cream  1 application Topical Daily  . dofetilide  125 mcg Oral BID  . fluticasone  2 spray Each Nare QHS  . furosemide  20 mg Oral Daily  . gabapentin  300 mg Oral QHS  . isosorbide mononitrate  60 mg Oral Daily  . losartan  100 mg Oral Daily  . magnesium oxide  400 mg Oral Daily  . mometasone-formoterol  2 puff Inhalation BID  . rivaroxaban  15 mg Oral Q supper  . sodium chloride flush  3 mL Intravenous Q12H  . sodium chloride flush  3 mL Intravenous Q12H  . valACYclovir  1,000 mg Oral QPC supper  . zolpidem  5 mg Oral QHS   . sodium chloride      OBJECTIVE: Physical Exam: Filed Vitals:   08/12/15 1315 08/12/15 1420 08/12/15 2149 08/13/15 0500  BP: 123/78 128/73 166/97 166/78  Pulse:  92 63 59  Temp:  97.6 F (36.4 C) 97.7 F (36.5 C) 98.5 F (36.9 C)  TempSrc:  Oral Oral Oral  Resp:  18    Height:      Weight:    164 lb 9.6 oz (74.662 kg)  SpO2:  99% 96% 98%    Intake/Output Summary (Last 24 hours) at 08/13/15 0653 Last data filed at 08/12/15 2230  Gross per 24 hour  Intake   1323 ml  Output      0 ml  Net   1323 ml    Telemetry reveals SB/Sr 50's-60's, APCs   GEN- The patient is well appearing, alert and oriented x 3 today.   Head- normocephalic, atraumatic Eyes-  Sclera clear, conjunctiva pink Ears- hearing intact Oropharynx- clear Neck- supple, no JVP Lymph- no cervical lymphadenopathy Lungs- Clear to ausculation bilaterally, normal work of breathing Heart- RRR, no murmurs, rubs or gallops, PMI not laterally displaced GI- soft, NT, ND Extremities- no clubbing, cyanosis, or edema Skin- no rash or lesion Psych- euthymic mood, full affect Neuro- strength and sensation are intact  LABS: Basic Metabolic Panel:  Recent Labs  08/12/15 0400  08/13/15 0400  NA 144 143  K 4.3 3.7  CL 107 107  CO2 28 27  GLUCOSE 102* 97  BUN 17 16  CREATININE 1.37* 1.29*  CALCIUM 9.0 8.8*  MG 2.1 2.0     ASSESSMENT AND PLAN:  Active Problems:   Atrial fibrillation, persistent (HCC) 1. Tikosyn initiation  Persistent AFib >>> SR     Tikosyn dose down-titrated yesterday to 18mcg due to QT prolongation  CHA2DS2Vasc is 3 on Xarelto  K+3.7  being repleated  Mag 2.0  Creat 1.29      QTc improved/stable on lower dose      2. HTN:      Some improvement yesterday, monitor with the addition of amlodipine  Brien Few Ursuy,PA-c 08/13/2015 6:53 AM  I have seen and examined this patient with Tommye Standard.  Agree with above, note added to reflect my findings.  On exam, regular rhythm, no murmurs, lungs clear.  Had tikosyn decreased to 125 mcg BID with improvement in QTc.  Continue to monitor rhythm and possible discharge today after last tikosyn dose.  Will M. Camnitz MD 08/13/2015 9:56 AM

## 2015-08-13 NOTE — Discharge Summary (Signed)
ELECTROPHYSIOLOGY PROCEDURE DISCHARGE SUMMARY    Patient ID: Austin [name],  MRN: [id number], DOB/AGE: 1930-08-13 82 y.o.  Admit date: 08/10/2015 Discharge date: 08/13/2015  Primary Care Physician: Geoffery Lyons, MD Primary Cardiologist/Electrophysiologist: Dr. Curt Bears  Primary Discharge Diagnosis:  1.  Persistent atrial fibrillation status post Tikosyn loading this admission      CHA2DS2Vasc is at least 3 on Xarelto  Secondary Discharge Diagnosis:  1. COPD/asthma 2. Blind R eye secondary to melanoma 3. OSA, compliant with CPAP 4. HTN  Allergies  Allergen Reactions  . Bee Venom Anaphylaxis  . Diltiazem Other (See Comments)    Severe headaches 120 mg BID  . Codeine Nausea And Vomiting  . Ivp Dye [Iodinated Diagnostic Agents] Rash  . Penicillins Swelling and Rash    Has patient had a PCN reaction causing immediate rash, facial/tongue/throat swelling, SOB or lightheadedness with hypotension: Yes Has patient had a PCN reaction causing severe rash involving mucus membranes or skin necrosis: No Has patient had a PCN reaction that required hospitalization No Has patient had a PCN reaction occurring within the last 10 years: No If all of the above answers are "NO", then may proceed with Cephalosporin use.  . Tape Itching and Rash    Redness.  Please use "paper" tape only     Procedures This Admission:  1.  Tikosyn loading    Brief HPI: Austin [name] is a 82 y.o. male with a past medical history as noted above.  They were referred to EP in the outpatient setting for treatment options of atrial fibrillation.  Risks, benefits, and alternatives to Tikosyn were reviewed with the patient who wished to proceed.    Hospital Course:  The patient was admitted and Tikosyn was initiated.  Renal function and electrolytes were followed during the hospitalization.  Their QTc had prolongation and required down-titration of his Tiksoyn with improvement and stable QTc, his EKG  reviewed by Dr. Curt Bears.  He converted to sinus rhythm 08/12/15.  They were monitored until discharge on telemetry which demonstrated SR.  On the day of discharge, they were examined by Dr Curt Bears who considered them stable for discharge to home.  Follow-up has been arranged with the Afib clinic in 1 week for a visit, BMET and EKG, and with Dr Curt Bears in 4 weeks.   Physical Exam: Filed Vitals:   08/12/15 1420 08/12/15 2149 08/13/15 0500 08/13/15 0810  BP: 128/73 166/97 166/78 155/78  Pulse: 92 63 59   Temp: 97.6 F (36.4 C) 97.7 F (36.5 C) 98.5 F (36.9 C)   TempSrc: Oral Oral Oral   Resp: 18     Height:      Weight:   164 lb 9.6 oz (74.662 kg)   SpO2: 99% 96% 98%     Labs:   Lab Results  Component Value Date   WBC 8.5 06/01/2015   HGB 14.4 06/01/2015   HCT 42.8 06/01/2015   MCV 92.8 06/01/2015   PLT 344 06/01/2015     Recent Labs Lab 08/13/15 0400  NA 143  K 3.7  CL 107  CO2 27  BUN 16  CREATININE 1.29*  CALCIUM 8.8*  GLUCOSE 97     Discharge Medications:    Medication List    TAKE these medications        acetaminophen 500 MG tablet  Commonly known as:  TYLENOL  Take 1,000 mg by mouth daily as needed for mild pain or headache.     ALPRAZolam 0.25 MG  tablet  Commonly known as:  XANAX  Take 0.25 mg by mouth daily as needed for anxiety.     budesonide-formoterol 160-4.5 MCG/ACT inhaler  Commonly known as:  SYMBICORT  Inhale 2 puffs into the lungs 2 (two) times daily.     butalbital-acetaminophen-caffeine 50-325-40 MG tablet  Commonly known as:  FIORICET, ESGIC  Take 1 tablet by mouth every 4 (four) hours as needed (severe headache). Maximum 4 tablets per day     carvedilol 25 MG tablet  Commonly known as:  COREG  Take 1 tablet (25 mg total) by mouth 2 (two) times daily with a meal.     clobetasol cream 0.05 %  Commonly known as:  TEMOVATE  Apply 1 application topically daily.     CULTURELLE PO  Take 1 capsule by mouth 2 (two) times daily with a  meal.     diphenhydrAMINE 25 MG tablet  Commonly known as:  BENADRYL  Take 1 tablet (25 mg total) by mouth every 6 (six) hours as needed for itching or allergies.     dofetilide 125 MCG capsule  Commonly known as:  TIKOSYN  Take 1 capsule (125 mcg total) by mouth 2 (two) times daily.     EPINEPHrine 0.15 MG/0.15ML injection  Commonly known as:  EPIPEN JR  Inject 0.15 mLs (0.15 mg total) into the muscle as needed for anaphylaxis.     fluticasone 50 MCG/ACT nasal spray  Commonly known as:  FLONASE  Place 2 sprays into both nostrils at bedtime.     furosemide 20 MG tablet  Commonly known as:  LASIX  Take 1 tablet (20 mg total) by mouth daily.     gabapentin 300 MG capsule  Commonly known as:  NEURONTIN  Take 300 mg by mouth at bedtime.     indomethacin 50 MG capsule  Commonly known as:  INDOCIN  Take 50 mg by mouth daily as needed (for gout flare up).     isosorbide mononitrate 60 MG 24 hr tablet  Commonly known as:  IMDUR  Take 1 tablet (60 mg total) by mouth daily.     losartan 100 MG tablet  Commonly known as:  COZAAR  Take 1 tablet (100 mg total) by mouth daily.     MAGNESIUM CITRATE PO  Take 250 mg by mouth at bedtime.     potassium chloride SA 20 MEQ tablet  Commonly known as:  K-DUR,KLOR-CON  Take 1 tablet (20 mEq total) by mouth daily.     PROAIR HFA 108 (90 Base) MCG/ACT inhaler  Generic drug:  albuterol  Inhale 2 puffs into the lungs every 4 (four) hours as needed for wheezing or shortness of breath (cough).     Rivaroxaban 15 MG Tabs tablet  Commonly known as:  XARELTO  Take 1 tablet (15 mg total) by mouth daily with supper.     SYSTANE PRESERVATIVE FREE 0.4-0.3 % Soln  Generic drug:  Polyethyl Glycol-Propyl Glycol  Place 1 drop into both eyes 2 (two) times daily.     valACYclovir 1000 MG tablet  Commonly known as:  VALTREX  Take 1,000 mg by mouth daily after supper.     zolpidem 10 MG tablet  Commonly known as:  AMBIEN  Take 10 mg by mouth at  bedtime. for sleep        Disposition: Home Discharge Instructions    Diet - low sodium heart healthy    Complete by:  As directed      Increase activity slowly  Complete by:  As directed           Follow-up Information    Follow up with [facility] On 08/21/2015.   Specialty:  Cardiology   Why:  11:00AM   Contact information:   [id number] [place] [phone]      Follow up with Charlcie Prisco Meredith Leeds, MD On 09/09/2015.   Specialty:  Cardiology   Why:  10:30AM   Contact information:   [place] [address] [phone]       Duration of Discharge Encounter: Greater than 30 minutes including physician time.  SignedTommye Standard, PA-C 08/13/2015 1:38 PM  I have seen and examined this patient with Tommye Standard.  Agree with above, note added to reflect my findings.  On exam, regular rhythm, no murmurs, lungs clear.  Admitted for tikosyn loading.  Converted to sinus rhythm after the initial dose of tikosyn.  Plan for discharge today on 125 mcg of tikosyn with follow up in clinic.  Had prolonged QTc on higher doses.    Dayron Odland M. Latunya Kissick MD 08/13/2015 3:07 PM

## 2015-08-13 NOTE — Progress Notes (Signed)
Pt. able to place home CPAP on independently.

## 2015-08-13 NOTE — Care Management Important Message (Signed)
Important Message  Patient Details  Name: Austin [name] MRN: [id number] Date of Birth: July 14, 1930   Medicare Important Message Given:  Yes    Rafik Koppel Abena 08/13/2015, 10:50 AM

## 2015-08-14 ENCOUNTER — Telehealth: Payer: Self-pay | Admitting: Cardiology

## 2015-08-14 NOTE — Telephone Encounter (Signed)
Patient's wife already has question answered. States she wasn't there when they reviewed discharge instructions and unaware of dosage increase. She understands now and has no further questions.

## 2015-08-14 NOTE — Progress Notes (Signed)
Pharmacy note:  Patient's wife called [facility] pharmacy to ask about Carvedilol dose. Informed her that dose was increased from 12.5 mg BID to 25 mg BID on 08/11/15 pm, while in the hospital. Discharge summary from 08/13/15 shows that a new prescription for 25 mg BID was [facility] on [address].  She was unaware on new Rx, but reports that she will pick it up today.  The misunderstanding was from the instructions to take 1 tablet BID, when she used to give him 2 tablets (of the 6.25 mg strength) BID.  She just noticed the difference in the #mg today.    Kelvin Cellar, RPh Pager: [phone] 08/14/2015 11:39 AM

## 2015-08-14 NOTE — Telephone Encounter (Signed)
New Message:  Pt's wife is calling to get clarity on the pt's Carvedilol instructions. Please f/u with her

## 2015-08-14 NOTE — Telephone Encounter (Signed)
Follow up       Calling to let the nurse know that [facility] also [facility] to get clarification on carvedilol.  She will put a note in the computer but do not know how to attach it to our message.

## 2015-08-17 ENCOUNTER — Telehealth: Payer: Self-pay | Admitting: Cardiology

## 2015-08-17 DIAGNOSIS — E858 Other amyloidosis: Secondary | ICD-10-CM | POA: Diagnosis not present

## 2015-08-17 DIAGNOSIS — Z85828 Personal history of other malignant neoplasm of skin: Secondary | ICD-10-CM | POA: Diagnosis not present

## 2015-08-17 DIAGNOSIS — L821 Other seborrheic keratosis: Secondary | ICD-10-CM | POA: Diagnosis not present

## 2015-08-17 DIAGNOSIS — L570 Actinic keratosis: Secondary | ICD-10-CM | POA: Diagnosis not present

## 2015-08-17 NOTE — Telephone Encounter (Signed)
Per message left by the patient on the refill voicemail, patient has not been able to refill tikosyn due to conflict with his insurance company. The call back number that he provided was [phone].

## 2015-08-17 NOTE — Telephone Encounter (Addendum)
New message     Talk to the nurse.  Having trouble getting tikosyn filled.  Not sure if it is a prior authorization problem or the drug store never got the request.  Wife asked that Dr Macky Lower nurse call her back

## 2015-08-17 NOTE — Telephone Encounter (Signed)
[facility] with [facility].  PA for generic Tikosyn 151mcg was approved for 1 year.  PA # [id number].  [facility] and [facility]'s wife [facility].

## 2015-08-19 DIAGNOSIS — G4733 Obstructive sleep apnea (adult) (pediatric): Secondary | ICD-10-CM | POA: Diagnosis not present

## 2015-08-21 ENCOUNTER — Other Ambulatory Visit: Payer: Self-pay

## 2015-08-21 ENCOUNTER — Encounter (HOSPITAL_COMMUNITY): Payer: Self-pay | Admitting: Nurse Practitioner

## 2015-08-21 ENCOUNTER — Ambulatory Visit (HOSPITAL_COMMUNITY)
Admit: 2015-08-21 | Discharge: 2015-08-21 | Disposition: A | Discharge status: Home / Self Care | Payer: Medicare PPO | Source: Ambulatory Visit | Attending: Nurse Practitioner | Admitting: Nurse Practitioner

## 2015-08-21 VITALS — BP 118/62 | HR 61 | Ht 68.0 in | Wt 169.4 lb

## 2015-08-21 DIAGNOSIS — Z7901 Long term (current) use of anticoagulants: Secondary | ICD-10-CM | POA: Insufficient documentation

## 2015-08-21 DIAGNOSIS — N183 Chronic kidney disease, stage 3 (moderate): Secondary | ICD-10-CM | POA: Insufficient documentation

## 2015-08-21 DIAGNOSIS — Z888 Allergy status to other drugs, medicaments and biological substances status: Secondary | ICD-10-CM | POA: Diagnosis not present

## 2015-08-21 DIAGNOSIS — I481 Persistent atrial fibrillation: Secondary | ICD-10-CM | POA: Diagnosis not present

## 2015-08-21 DIAGNOSIS — I129 Hypertensive chronic kidney disease with stage 1 through stage 4 chronic kidney disease, or unspecified chronic kidney disease: Secondary | ICD-10-CM | POA: Insufficient documentation

## 2015-08-21 DIAGNOSIS — Z885 Allergy status to narcotic agent status: Secondary | ICD-10-CM | POA: Diagnosis not present

## 2015-08-21 DIAGNOSIS — Z91041 Radiographic dye allergy status: Secondary | ICD-10-CM | POA: Diagnosis not present

## 2015-08-21 DIAGNOSIS — Z8249 Family history of ischemic heart disease and other diseases of the circulatory system: Secondary | ICD-10-CM | POA: Insufficient documentation

## 2015-08-21 DIAGNOSIS — Z880 Allergy status to penicillin: Secondary | ICD-10-CM | POA: Insufficient documentation

## 2015-08-21 DIAGNOSIS — Z79899 Other long term (current) drug therapy: Secondary | ICD-10-CM | POA: Diagnosis not present

## 2015-08-21 DIAGNOSIS — G4733 Obstructive sleep apnea (adult) (pediatric): Secondary | ICD-10-CM | POA: Insufficient documentation

## 2015-08-21 DIAGNOSIS — J45909 Unspecified asthma, uncomplicated: Secondary | ICD-10-CM | POA: Diagnosis not present

## 2015-08-21 DIAGNOSIS — Z85820 Personal history of malignant melanoma of skin: Secondary | ICD-10-CM | POA: Diagnosis not present

## 2015-08-21 DIAGNOSIS — I4819 Other persistent atrial fibrillation: Secondary | ICD-10-CM

## 2015-08-21 LAB — BASIC METABOLIC PANEL
ANION GAP: 8 (ref 5–15)
BUN: 13 mg/dL (ref 6–20)
CHLORIDE: 106 mmol/L (ref 101–111)
CO2: 29 mmol/L (ref 22–32)
Calcium: 8.8 mg/dL — ABNORMAL LOW (ref 8.9–10.3)
Creatinine, Ser: 1.33 mg/dL — ABNORMAL HIGH (ref 0.61–1.24)
GFR calc Af Amer: 56 mL/min — ABNORMAL LOW (ref >60)
GFR calc non Af Amer: 48 mL/min — ABNORMAL LOW (ref >60)
GLUCOSE: 111 mg/dL — AB (ref 65–99)
POTASSIUM: 4 mmol/L (ref 3.5–5.1)
Sodium: 143 mmol/L (ref 135–145)

## 2015-08-21 LAB — MAGNESIUM: MAGNESIUM: 2.1 mg/dL (ref 1.7–2.4)

## 2015-08-21 NOTE — Progress Notes (Signed)
Patient ID: [name], male   DOB: [birth year], 82 y.o.   MRN: [id number]     Primary Care Physician: Austin Lyons, MD Referring Physician: [place] [name] f/u EP: Dr. Reggy [name] Austin [name] is a 82 y.o. male with a h/o persistent afib that failed flecainide, amiodarone and was admitted and loaded on amiodarone 3/27 thru 3/30. Renal function and electrolytes were followed during the hospitalization. QTc had prolongation and required down-titration of  Tiksoyn with improvement and stable QTc, his EKG reviewed by Dr. Curt [name]. He converted to sinus rhythm 08/12/15. He was monitored until discharge on telemetry which demonstrated SR.  F/u in afib clinc and doing well. Does not report any afib. He has been taking benadryl at home and he was asked not to take this due to possibly  prolonging   Qtc. Discussed other compliance recommendations when on tikosyn.  Today, he denies symptoms of palpitations, chest pain, shortness of breath, orthopnea, PND, lower extremity edema, dizziness, presyncope, syncope, or neurologic sequela. The patient is tolerating medications without difficulties and is otherwise without complaint today.   Past Medical History  Diagnosis Date  . Melanoma in situ of right eyelid ([place])   . Allergy   . Asthma   . Gout   . Cough   . OSA (obstructive sleep apnea)   . CKD (chronic kidney disease) stage 3, GFR 30-59 ml/min   . Hypertensive chronic kidney disease   . BPH (benign prostatic hypertrophy)   . Persistent atrial fibrillation Austin [name])    Past Surgical History  Procedure Laterality Date  . [name] surgery    . Cardioversion [name] 06/05/2015    Procedure: CARDIOVERSION;  Surgeon: Austin Klein, MD;  Location: [facility];  Service: Cardiovascular;  Laterality: [name];    Current Outpatient Prescriptions  Medication Sig Dispense Refill  . acetaminophen (TYLENOL) 500 MG tablet Take 1,000 mg by mouth daily as needed for mild pain or headache.    . albuterol (PROAIR HFA) 108  (90 Base) MCG/ACT inhaler Inhale 2 puffs into the lungs every 4 (four) hours as needed for wheezing or shortness of breath (cough).    . ALPRAZolam (XANAX) 0.25 MG tablet Take 0.25 mg by mouth daily as needed for anxiety.   0  . budesonide-formoterol (SYMBICORT) 160-4.5 MCG/ACT inhaler Inhale 2 puffs into the lungs 2 (two) times daily.    . butalbital-acetaminophen-caffeine (FIORICET, ESGIC) 50-325-40 MG tablet Take 1 tablet by mouth every 4 (four) hours as needed (severe headache). Maximum 4 tablets per day  5  . carvedilol (COREG) 25 MG tablet Take 1 tablet (25 mg total) by mouth 2 (two) times daily with a meal. 60 tablet 3  . clobetasol cream (TEMOVATE) [id number] % Apply 1 application topically daily.     . diphenhydrAMINE (BENADRYL) 25 MG tablet Take 1 tablet (25 mg total) by mouth every 6 (six) hours as needed for itching or allergies. (Patient taking differently: Take 25 mg by mouth at bedtime. ) 20 tablet 0  . dofetilide (TIKOSYN) 125 MCG capsule Take 1 capsule (125 mcg total) by mouth 2 (two) times daily. 60 capsule 3  . fluticasone (FLONASE) 50 MCG/ACT nasal spray Place 2 sprays into both nostrils at bedtime.   8  . furosemide (LASIX) 20 MG tablet Take 1 tablet (20 mg total) by mouth daily. 30 tablet 11  . gabapentin (NEURONTIN) 300 MG capsule Take 300 mg by mouth at bedtime.    . indomethacin (INDOCIN) 50 MG capsule Take 50 mg by mouth daily  as needed (for gout flare up).     . isosorbide mononitrate (IMDUR) 60 MG 24 hr tablet Take 1 tablet (60 mg total) by mouth daily. 30 tablet 3  . Lactobacillus Rhamnosus, GG, (CULTURELLE PO) Take 1 capsule by mouth 2 (two) times daily with a meal.    . losartan (COZAAR) 100 MG tablet Take 1 tablet (100 mg total) by mouth daily. 30 tablet 3  . MAGNESIUM CITRATE PO Take 250 mg by mouth at bedtime.    Austin [name] Glycol-Propyl Glycol (SYSTANE PRESERVATIVE FREE) 0.4-0.3 % SOLN Place 1 drop into both eyes 2 (two) times daily.    . potassium chloride SA  (K-DUR,KLOR-CON) 20 MEQ tablet Take 1 tablet (20 mEq total) by mouth daily. 30 tablet 11  . Rivaroxaban (XARELTO) 15 MG TABS tablet Take 1 tablet (15 mg total) by mouth daily with supper. (Patient taking differently: Take 15 mg by mouth daily after supper. ) 90 tablet 3  . valACYclovir (VALTREX) 1000 MG tablet Take 1,000 mg by mouth daily after supper.     . zolpidem (AMBIEN) 10 MG tablet Take 10 mg by mouth at bedtime. for sleep  5  . EPINEPHrine 0.15 MG/0.15ML IJ injection Inject 0.15 mLs (0.15 mg total) into the muscle as needed for anaphylaxis. (Patient taking differently: Inject 0.15 mg into the muscle once as needed for anaphylaxis. ) 2 Device 0   No current facility-administered medications for this encounter.    Allergies  Allergen Reactions  . Bee Venom Anaphylaxis  . Diltiazem Other (See Comments)    Severe headaches 120 mg BID  . Codeine Nausea And Vomiting  . Ivp Dye [Iodinated Diagnostic Agents] Rash  . Penicillins Swelling and Rash    Has patient had a PCN reaction causing immediate rash, facial/tongue/throat swelling, SOB or lightheadedness with hypotension: Yes Has patient had a PCN reaction causing severe rash involving mucus membranes or skin necrosis: No Has patient had a PCN reaction that required hospitalization No Has patient had a PCN reaction occurring within the last 10 years: No If all of the above answers are "NO", then may proceed with Cephalosporin use.  . Tape Itching and Rash    Redness.  Please use "paper" tape only    Social History   Social History  . Marital Status: Married    Spouse Name: [name]  . Number of Children: [name]  . Years of Education: [name]   Occupational History  . Not on file.   Social History Main Topics  . Smoking status: Never Smoker   . Smokeless tobacco: Never Used  . Alcohol Use: 0.6 oz/week    1 Standard drinks or equivalent per week     Comment: occ  . Drug Use: No  . Sexual Activity: Not on file   Other Topics Concern    . Not on file   Social History Narrative   Right Handed.  Caffiene  Coffee, tea 3 cups daily.   Pt retired.     Lives at home.      Family History  Problem Relation Age of Onset  . Hypertension Father   . Lung cancer Father     ROS- All systems are reviewed and negative except as per the HPI above  Physical Exam: Filed Vitals:   08/21/15 1108  BP: 118/62  Pulse: 61  Height: 5\' 8"  (1.727 m)  Weight: 169 lb 6.4 oz (76.839 kg)    GEN- The patient is well appearing, alert and oriented x 3 today.  Head- normocephalic, atraumatic Eyes-  Sclera clear, conjunctiva pink. Blind rt [name] Ears- hearing intact Oropharynx- clear Neck- supple, no JVP Lymph- no cervical lymphadenopathy Lungs- Clear to ausculation bilaterally, normal work of breathing Heart- Regular rate and rhythm, no murmurs, rubs or gallops, PMI not laterally displaced GI- soft, NT, ND, + BS Extremities- no clubbing, cyanosis, or edema MS- no significant deformity or atrophy Skin- no rash or lesion Psych- euthymic mood, full affect Neuro- strength and sensation are intact  EKG- SR at 61 bpm, LAD qrs int 88 ms, Qtc 473 ms Epic records reviewed  Assessment and Plan: 1. Persistent afib SR restored with tikosyn loading Continue Tikosyn 125 mg bid Stop benadryl Continue xarelto 15 mg qd Bmet/mag today  F/u with Dr. Curt [name] 4/26   Geroge Baseman. [place], Benicia [name] [address] [place], [place] [address] [phone]

## 2015-08-25 ENCOUNTER — Telehealth: Payer: Self-pay

## 2015-08-25 NOTE — Telephone Encounter (Signed)
Prior auth for Dofetilide 149mcg sent to [facility].

## 2015-09-01 ENCOUNTER — Telehealth: Payer: Self-pay

## 2015-09-01 NOTE — Telephone Encounter (Signed)
Dofetilide approved through [facility] through 08/16/2016.

## 2015-09-08 ENCOUNTER — Ambulatory Visit: Payer: Medicare PPO | Admitting: Cardiology

## 2015-09-08 NOTE — Progress Notes (Signed)
Electrophysiology Office Note   Date:  09/08/2015   ID:  Austin [name], DOB 15-Mar-1931, MRN [id number]  PCP:  Geoffery Lyons, MD  Primary Electrophysiologist:  Constance Haw, MD    No chief complaint on file.    History of Present Illness: Austin [name] is a 82 y.o. male who presents today for electrophysiology evaluation.   He has a h/o melanoma of rt eyelid with subsequent loss of vision in rt eye, COPD, CKD, recently diagnosed sleep apnea.  He was diagnosed with atrial fibrillation in 03/2016.  He was admitted to the hospital for Tikosyn load at the end of March.  He converted to sinus rhythm while on Tikosyn.  He had follow up in AF clinic in early April and was feeling well.    Today, he denies symptoms of palpitations, orthopnea, PND, lower extremity edema, claudication, dizziness, presyncope, syncope, bleeding, or neurologic sequela. The patient is tolerating medications without difficulties and is otherwise without complaint today.  Today, he says that he feels well without any major complaints. Since being in sinus rhythm for the last month, he feels like he is regaining his strength.   Past Medical History  Diagnosis Date  . Melanoma in situ of right eyelid ([place])   . Allergy   . Asthma   . Gout   . Cough   . OSA (obstructive sleep apnea)   . CKD (chronic kidney disease) stage 3, GFR 30-59 ml/min   . Hypertensive chronic kidney disease   . BPH (benign prostatic hypertrophy)   . Persistent atrial fibrillation [facility])    Past Surgical History  Procedure Laterality Date  . Eye surgery    . Cardioversion [name] 06/05/2015    Procedure: CARDIOVERSION;  Surgeon: Sanda Klein, MD;  Location: [facility];  Service: Cardiovascular;  Laterality: [name];     Current Outpatient Prescriptions  Medication Sig Dispense Refill  . acetaminophen (TYLENOL) 500 MG tablet Take 1,000 mg by mouth daily as needed for mild pain or headache.    . albuterol (PROAIR HFA) 108 (90  Base) MCG/ACT inhaler Inhale 2 puffs into the lungs every 4 (four) hours as needed for wheezing or shortness of breath (cough).    . ALPRAZolam (XANAX) 0.25 MG tablet Take 0.25 mg by mouth daily as needed for anxiety.   0  . budesonide-formoterol (SYMBICORT) 160-4.5 MCG/ACT inhaler Inhale 2 puffs into the lungs 2 (two) times daily.    . butalbital-acetaminophen-caffeine (FIORICET, ESGIC) 50-325-40 MG tablet Take 1 tablet by mouth every 4 (four) hours as needed (severe headache). Maximum 4 tablets per day  5  . carvedilol (COREG) 25 MG tablet Take 1 tablet (25 mg total) by mouth 2 (two) times daily with a meal. 60 tablet 3  . clobetasol cream (TEMOVATE) [id number] % Apply 1 application topically daily.     . diphenhydrAMINE (BENADRYL) 25 MG tablet Take 1 tablet (25 mg total) by mouth every 6 (six) hours as needed for itching or allergies. (Patient taking differently: Take 25 mg by mouth at bedtime. ) 20 tablet 0  . dofetilide (TIKOSYN) 125 MCG capsule Take 1 capsule (125 mcg total) by mouth 2 (two) times daily. 60 capsule 3  . EPINEPHrine 0.15 MG/0.15ML IJ injection Inject 0.15 mLs (0.15 mg total) into the muscle as needed for anaphylaxis. (Patient taking differently: Inject 0.15 mg into the muscle once as needed for anaphylaxis. ) 2 Device 0  . fluticasone (FLONASE) 50 MCG/ACT nasal spray Place 2 sprays into both nostrils at  bedtime.   8  . furosemide (LASIX) 20 MG tablet Take 1 tablet (20 mg total) by mouth daily. 30 tablet 11  . gabapentin (NEURONTIN) 300 MG capsule Take 300 mg by mouth at bedtime.    . indomethacin (INDOCIN) 50 MG capsule Take 50 mg by mouth daily as needed (for gout flare up).     . isosorbide mononitrate (IMDUR) 60 MG 24 hr tablet Take 1 tablet (60 mg total) by mouth daily. 30 tablet 3  . Lactobacillus Rhamnosus, GG, (CULTURELLE PO) Take 1 capsule by mouth 2 (two) times daily with a meal.    . losartan (COZAAR) 100 MG tablet Take 1 tablet (100 mg total) by mouth daily. 30 tablet 3  .  MAGNESIUM CITRATE PO Take 250 mg by mouth at bedtime.    Vladimir Faster Glycol-Propyl Glycol (SYSTANE PRESERVATIVE FREE) 0.4-0.3 % SOLN Place 1 drop into both eyes 2 (two) times daily.    . potassium chloride SA (K-DUR,KLOR-CON) 20 MEQ tablet Take 1 tablet (20 mEq total) by mouth daily. 30 tablet 11  . Rivaroxaban (XARELTO) 15 MG TABS tablet Take 1 tablet (15 mg total) by mouth daily with supper. (Patient taking differently: Take 15 mg by mouth daily after supper. ) 90 tablet 3  . valACYclovir (VALTREX) 1000 MG tablet Take 1,000 mg by mouth daily after supper.     . zolpidem (AMBIEN) 10 MG tablet Take 10 mg by mouth at bedtime. for sleep  5   No current facility-administered medications for this visit.    Allergies:   Bee venom; Diltiazem; Codeine; Ivp dye; Penicillins; and Tape   Social History:  The patient  reports that he has never smoked. He has never used smokeless tobacco. He reports that he drinks about 0.6 oz of alcohol per week. He reports that he does not use illicit drugs.   Family History:  The patient's family history includes Hypertension in his father; Lung cancer in his father.    ROS:  Please see the history of present illness.   Otherwise, review of systems is positive for headaches.   All other systems are reviewed and negative.    PHYSICAL EXAM: VS:  There were no vitals taken for this visit. , BMI There is no weight on file to calculate BMI. GEN: Well nourished, well developed, in no acute distress HEENT: normal Neck: no JVD, carotid bruits, or masses Cardiac: RRR no murmurs, rubs, or gallops,no edema  Respiratory:  clear to auscultation bilaterally, normal work of breathing GI: soft, nontender, nondistended, + BS MS: no deformity or atrophy Skin: warm and dry Neuro:  Strength and sensation are intact Psych: euthymic mood, full affect  EKG:  EKG is not ordered today.   Recent Labs: 06/01/2015: Hemoglobin 14.4; Platelets 344 08/21/2015: BUN 13; Creatinine, Ser  1.33*; Magnesium 2.1; Potassium 4.0; Sodium 143    Lipid Panel  No results found for: CHOL, TRIG, HDL, CHOLHDL, VLDL, LDLCALC, LDLDIRECT   Wt Readings from Last 3 Encounters:  08/21/15 169 lb 6.4 oz (76.839 kg)  08/13/15 164 lb 9.6 oz (74.662 kg)  07/29/15 172 lb (78.019 kg)      Other studies Reviewed: Additional studies/ records that were reviewed today include: TTE 05/18/15  Review of the above records today demonstrates:  - LVEF 60-65%, moderate LVH, normal wall motion , calcified aortic valve with mild regurgitation, upper normal LA size, mild TR, RVSP 37 mmHg, lipomatous hypertrophy of the IAS, normal IVC.  ETT 06/29/15  Nuclear stress EF: 55%.  There was no ST segment deviation noted during stress. Patient was in atrial fibrillation throughout the study.  There is a small defect of mild severity present in the basal inferolateral and mid inferolateral location. The defect is non-reversible. There is a small defect of mild severity present in the basal inferior and mid inferior location. The defect is non-reversible.There is a small defect of mild severity present in the apex location. The defect is non-reversible. No ischemia noted.  This is a low risk study.  The left ventricular ejection fraction is normal (55-65%).   ASSESSMENT AND PLAN:  1.  Persistent atrial fibrillation: Has been loaded on tikosyn and doing well.  Has had no breakthrough.  Andera Cranmer continue tikosyn at current dose.   He is currently on Xarelto. Tolerating the take [place] and well without any complaints. He is in sinus rhythm today. We'll make no changes.  This patients CHA2DS2-VASc Score and unadjusted Ischemic Stroke Rate (% per year) is equal to 3.2 % stroke rate/year from a score of 3 Above score calculated as 1 point each if present [CHF, HTN, DM, Vascular=MI/PAD/Aortic Plaque, Age if 65-74, or Male]  2. Hypertension: blood pressures at home is been elevated as well as to clinic today. We'll  indicate increase his Cozaar to 100 mg  3. Chest pain: has had a low risk stress test but continues to have chest pain. He did have defects on his stress test that were nonreversible. He may be having some peri-infarct ischemia. We'll plan on starting him on Imdur 60 mg.     Current medicines are reviewed at length with the patient today.   The patient does not have concerns regarding his medicines.  The following changes were made today:  none  Labs/ tests ordered today include:  No orders of the defined types were placed in this encounter.     Disposition:   FU with Shantinique Picazo 6  months  Signed, Hildagarde Holleran Meredith Leeds, MD  09/08/2015 9:18 PM     [address] [place] [address] [phone] (office) [phone] (fax)

## 2015-09-09 ENCOUNTER — Ambulatory Visit (INDEPENDENT_AMBULATORY_CARE_PROVIDER_SITE_OTHER): Payer: Medicare PPO | Admitting: Cardiology

## 2015-09-09 ENCOUNTER — Encounter: Payer: Self-pay | Admitting: Cardiology

## 2015-09-09 VITALS — BP 130/88 | HR 61 | Ht 68.0 in | Wt 166.6 lb

## 2015-09-09 DIAGNOSIS — I480 Paroxysmal atrial fibrillation: Secondary | ICD-10-CM | POA: Diagnosis not present

## 2015-09-09 NOTE — Patient Instructions (Signed)
Medication Instructions:  Your physician recommends that you continue on your current medications as directed. Please refer to the Current Medication list given to you today.   Labwork: None ordered   Testing/Procedures: None ordered   Follow-Up: Your physician wants you to follow-up in: 6 months with Dr Camnitz You will receive a reminder letter in the mail two months in advance. If you don't receive a letter, please call our office to schedule the follow-up appointment.   Any Other Special Instructions Will Be Listed Below (If Applicable).     If you need a refill on your cardiac medications before your next appointment, please call your pharmacy.   

## 2015-09-16 DIAGNOSIS — G4733 Obstructive sleep apnea (adult) (pediatric): Secondary | ICD-10-CM | POA: Diagnosis not present

## 2015-09-18 DIAGNOSIS — G4733 Obstructive sleep apnea (adult) (pediatric): Secondary | ICD-10-CM | POA: Diagnosis not present

## 2015-09-29 DIAGNOSIS — G4733 Obstructive sleep apnea (adult) (pediatric): Secondary | ICD-10-CM | POA: Diagnosis not present

## 2015-10-05 ENCOUNTER — Ambulatory Visit: Payer: Medicare PPO | Admitting: Cardiology

## 2015-10-07 ENCOUNTER — Other Ambulatory Visit: Payer: Self-pay

## 2015-10-07 MED ORDER — ISOSORBIDE MONONITRATE ER 60 MG PO TB24: 60.0000 mg | ORAL_TABLET | Freq: Every day | ORAL | Status: DC

## 2015-10-09 ENCOUNTER — Other Ambulatory Visit: Payer: Self-pay

## 2015-10-09 MED ORDER — CARVEDILOL 25 MG PO TABS: 25.0000 mg | ORAL_TABLET | Freq: Two times a day (BID) | ORAL | Status: DC

## 2015-10-19 DIAGNOSIS — G4733 Obstructive sleep apnea (adult) (pediatric): Secondary | ICD-10-CM | POA: Diagnosis not present

## 2015-10-24 ENCOUNTER — Other Ambulatory Visit: Payer: Self-pay | Admitting: Cardiology

## 2015-11-10 DIAGNOSIS — Z85828 Personal history of other malignant neoplasm of skin: Secondary | ICD-10-CM | POA: Diagnosis not present

## 2015-11-10 DIAGNOSIS — C44519 Basal cell carcinoma of skin of other part of trunk: Secondary | ICD-10-CM | POA: Diagnosis not present

## 2015-11-10 DIAGNOSIS — D2261 Melanocytic nevi of right upper limb, including shoulder: Secondary | ICD-10-CM | POA: Diagnosis not present

## 2015-11-10 DIAGNOSIS — L821 Other seborrheic keratosis: Secondary | ICD-10-CM | POA: Diagnosis not present

## 2015-11-10 DIAGNOSIS — D2262 Melanocytic nevi of left upper limb, including shoulder: Secondary | ICD-10-CM | POA: Diagnosis not present

## 2015-11-10 DIAGNOSIS — D485 Neoplasm of uncertain behavior of skin: Secondary | ICD-10-CM | POA: Diagnosis not present

## 2015-11-10 DIAGNOSIS — L298 Other pruritus: Secondary | ICD-10-CM | POA: Diagnosis not present

## 2015-11-10 DIAGNOSIS — D225 Melanocytic nevi of trunk: Secondary | ICD-10-CM | POA: Diagnosis not present

## 2015-11-10 DIAGNOSIS — L814 Other melanin hyperpigmentation: Secondary | ICD-10-CM | POA: Diagnosis not present

## 2015-11-18 DIAGNOSIS — G4733 Obstructive sleep apnea (adult) (pediatric): Secondary | ICD-10-CM | POA: Diagnosis not present

## 2015-11-18 DIAGNOSIS — H16239 Neurotrophic keratoconjunctivitis, unspecified eye: Secondary | ICD-10-CM | POA: Diagnosis not present

## 2015-11-18 DIAGNOSIS — H11131 Conjunctival pigmentations, right eye: Secondary | ICD-10-CM | POA: Diagnosis not present

## 2015-11-18 DIAGNOSIS — H30141 Acute posterior multifocal placoid pigment epitheliopathy, right eye: Secondary | ICD-10-CM | POA: Diagnosis not present

## 2015-11-18 DIAGNOSIS — C6901 Malignant neoplasm of right conjunctiva: Secondary | ICD-10-CM | POA: Diagnosis not present

## 2015-12-10 ENCOUNTER — Other Ambulatory Visit: Payer: Self-pay | Admitting: Cardiology

## 2015-12-19 DIAGNOSIS — G4733 Obstructive sleep apnea (adult) (pediatric): Secondary | ICD-10-CM | POA: Diagnosis not present

## 2015-12-31 DIAGNOSIS — Z125 Encounter for screening for malignant neoplasm of prostate: Secondary | ICD-10-CM | POA: Diagnosis not present

## 2015-12-31 DIAGNOSIS — I10 Essential (primary) hypertension: Secondary | ICD-10-CM | POA: Diagnosis not present

## 2015-12-31 DIAGNOSIS — M109 Gout, unspecified: Secondary | ICD-10-CM | POA: Diagnosis not present

## 2016-01-01 DIAGNOSIS — G4733 Obstructive sleep apnea (adult) (pediatric): Secondary | ICD-10-CM | POA: Diagnosis not present

## 2016-01-07 ENCOUNTER — Telehealth: Payer: Self-pay | Admitting: Cardiology

## 2016-01-07 NOTE — Telephone Encounter (Signed)
Follow Up:    Wife called back and say please call on the last number she called from.

## 2016-01-07 NOTE — Telephone Encounter (Signed)
Ms. Mannis is calling in reference to one of medications Lasik . Wanting to know the dosage in which he should take while they are traveling . Please call   Thanks

## 2016-01-07 NOTE — Telephone Encounter (Signed)
Called patient's wife back Geovani Isa [facility]). Informed patient's wife that patient should not stop his Lasix. Informed [name] that patient can take his Lasix any time of the day, and the afternoon might be better than in the evening. Patient's wife verbalized understanding.

## 2016-01-13 DIAGNOSIS — Z23 Encounter for immunization: Secondary | ICD-10-CM | POA: Diagnosis not present

## 2016-01-13 DIAGNOSIS — G4730 Sleep apnea, unspecified: Secondary | ICD-10-CM | POA: Diagnosis not present

## 2016-01-13 DIAGNOSIS — M1990 Unspecified osteoarthritis, unspecified site: Secondary | ICD-10-CM | POA: Diagnosis not present

## 2016-01-13 DIAGNOSIS — M109 Gout, unspecified: Secondary | ICD-10-CM | POA: Diagnosis not present

## 2016-01-13 DIAGNOSIS — Z0000 Encounter for general adult medical examination without abnormal findings: Secondary | ICD-10-CM | POA: Diagnosis not present

## 2016-01-13 DIAGNOSIS — I129 Hypertensive chronic kidney disease with stage 1 through stage 4 chronic kidney disease, or unspecified chronic kidney disease: Secondary | ICD-10-CM | POA: Diagnosis not present

## 2016-01-13 DIAGNOSIS — N183 Chronic kidney disease, stage 3 (moderate): Secondary | ICD-10-CM | POA: Diagnosis not present

## 2016-01-13 DIAGNOSIS — I4891 Unspecified atrial fibrillation: Secondary | ICD-10-CM | POA: Diagnosis not present

## 2016-01-13 DIAGNOSIS — I10 Essential (primary) hypertension: Secondary | ICD-10-CM | POA: Diagnosis not present

## 2016-01-13 DIAGNOSIS — N400 Enlarged prostate without lower urinary tract symptoms: Secondary | ICD-10-CM | POA: Diagnosis not present

## 2016-01-13 DIAGNOSIS — Z6825 Body mass index (BMI) 25.0-25.9, adult: Secondary | ICD-10-CM | POA: Diagnosis not present

## 2016-01-13 DIAGNOSIS — J45909 Unspecified asthma, uncomplicated: Secondary | ICD-10-CM | POA: Diagnosis not present

## 2016-01-15 DIAGNOSIS — Z1212 Encounter for screening for malignant neoplasm of rectum: Secondary | ICD-10-CM | POA: Diagnosis not present

## 2016-01-19 DIAGNOSIS — G4733 Obstructive sleep apnea (adult) (pediatric): Secondary | ICD-10-CM | POA: Diagnosis not present

## 2016-01-25 DIAGNOSIS — D225 Melanocytic nevi of trunk: Secondary | ICD-10-CM | POA: Diagnosis not present

## 2016-01-25 DIAGNOSIS — L821 Other seborrheic keratosis: Secondary | ICD-10-CM | POA: Diagnosis not present

## 2016-01-25 DIAGNOSIS — Z85828 Personal history of other malignant neoplasm of skin: Secondary | ICD-10-CM | POA: Diagnosis not present

## 2016-01-25 DIAGNOSIS — L910 Hypertrophic scar: Secondary | ICD-10-CM | POA: Diagnosis not present

## 2016-01-25 DIAGNOSIS — L814 Other melanin hyperpigmentation: Secondary | ICD-10-CM | POA: Diagnosis not present

## 2016-02-03 ENCOUNTER — Ambulatory Visit (INDEPENDENT_AMBULATORY_CARE_PROVIDER_SITE_OTHER): Payer: Medicare PPO | Admitting: Neurology

## 2016-02-03 ENCOUNTER — Encounter: Payer: Self-pay | Admitting: Neurology

## 2016-02-03 VITALS — BP 143/76 | HR 54 | Ht 68.0 in | Wt 167.0 lb

## 2016-02-03 DIAGNOSIS — I480 Paroxysmal atrial fibrillation: Secondary | ICD-10-CM

## 2016-02-03 DIAGNOSIS — G4733 Obstructive sleep apnea (adult) (pediatric): Secondary | ICD-10-CM | POA: Diagnosis not present

## 2016-02-03 DIAGNOSIS — Z9989 Dependence on other enabling machines and devices: Principal | ICD-10-CM

## 2016-02-03 NOTE — Progress Notes (Signed)
SLEEP MEDICINE CLINIC   Provider:  Larey Seat, M D  Referring Provider: Burnard Bunting, MD Primary Care Physician:  Geoffery Lyons, MD  Chief Complaint  Patient presents with  . Sleep Apnea    Says it was difficult getting use to his CPAP machine but he is doing better now.  He estimates wearing it seven hours each night.   Chief complaint according to patient : " I snore "   HPI:  Austin [name] is a 82 y.o. male , seen here as a referral from Dr. Reynaldo Minium for a sleep consultation,  Austin [name] is seen here in the presence of his wife out of concern for his sleep breathing. The patient has a history of asthma and COPD as well as hypertension. He has a history of corneal ulcer form shingles and has cancer. He was treated with chemotherapy in the treatment of melanoma and developed cataracts. By now he has lost sight in his right eye which is cloudy with neovascularization. Austin [name] has been witnessed to snore and his wife has occasionally noted him to grasp or bruits a little irregular but she is not sure that he has any apnea per se. He does not sleep in supine position but on his side. Occasionally he will wake up from a gasping for air or from a snore-  and that is more likely if he fell asleep in a recliner or in a seated position.  Sleep habits are as follows: 9.00 PM is his usual bedtime but he developed a very long sleep latency. He is usually ready by the time he goes to bed to sleep and does not feel that he has racing thoughts or extreme worry is keeping him from sleeping. He has begun taking a sleep aid 1 hour before intended bedtime which helps him to initiate sleep, but sleep is interrupted 2-3 times by his urge to urinate. He rises in the morning at 5:30 spontaneously or being woken by his dog. He feels ready to go in the morning restored or refreshed. After lunch he likes to nap this is usually 15-30 minutes. He does not report dreams and his nap time and he does  not dream a lot at night either. His wife has reported that he sleep talks. He is not a sleep walker and he reports no nightmares, or REM behavior. He will get about 7 hours of nocturnal sleep, sleeps on his side not in supine. The bedroom is cool, quiet and dark. Sleep medical history and family sleep history: all siblings deceased, no known sleep disorder history.  Social history:  Married, right-handed Caucasian male retired. 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 He is a nonsmoker, he does drink alcohol socially, less than 4 drinks a week. 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  Interval history from 07/29/2015, Austin [name] is here following up on 2 sleep studies. He underwent a classical polysomnography and attended sleep study in lap on 04/05/2015 he had a sleep latency of 23.5 minutes and a REM latency of 86 minutes but overall he slept not very well his sleep efficiency was only 64% for the night he had mild apnea with an AHI of 11.2 but his oxygen nadir was concerning 77% was his lowest saturation at night and the total desaturation time was 43.5 minutes. He did not retain CO2.  The study was followed with a CPAP titration full night on 04/26/2015. The patient had 0  apneas during the night his oxygen nadir rose to 90%. He slept for 30 minutes in dream sleep and was in supine sleep position.  Austin [name] was prescribed an AutoPap between 5 and 10 cm water with 2 cm flex function. I'm also able today to review his compliance report he was 100% compliant for 30 out of 30 days was 87% compliance for over 4 hours consecutive use at night. The patient used at the 95th percentile of pressure of 9.9 cm and an AHI of 1.4 was achieved. He does not have major air leaks. The patient reports he is bothered by his nasal pillow and changed to a FFM he reports this  gets dislodged. Now using an AIRFIT F FFM. The patient demonstrated how he used to mask and actually the  month with 2 right over his bridge of the nose and leaking can irritate his eyes and it is also sitting on the tip of the chin and not feeling under his lower lip. I simply believe that this mask is 1 size too big for him.   Interval history  02-03-2016 ,  Austin [name] is here today with an excellent 100% compliance on CPAP and a residual AHI of 1.3 using an AutoSet with a minimum pressure of 6 cm, maximum pressure of 10 cm and 2 cm expiratory pressure relief. He reports that he recently participated in a bus to her and traveled through [place], every night the company state at a different hotel and traveling with the CPAP was arduous. He has excellent compliance but I informed him that I would not be insisting on him using CPAP for a 34 or 5 day bus trip considering that the machine can also be damaged. He endorsed today and low-grade of the fatigue severity score of 25 points and the Epworth sleepiness score was endorsed 10 points. He is slightly hoarse , seasonal allergies bother him.    Review of Systems: Out of a complete 14 system review, the patient complains of only the following symptoms, and all other reviewed systems are negative.  skin lesions, itching,  Blindness right eye .  Epworth score  10 from 7, Fatigue severity score 25   , depression score see below.     Social History   Social History  . Marital status: Married    Spouse name: [name]  . Number of children: [name]  . Years of education: [name]   Occupational History  . Not on file.   Social History Main Topics  . Smoking status: Never Smoker  . Smokeless tobacco: Never Used  . Alcohol use 0.6 oz/week    1 Standard drinks or equivalent per week     Comment: occ  . Drug use: No  . Sexual activity: Not on file   Other Topics Concern  . Not on file   Social History Narrative   Right Handed.  Caffiene  Coffee, tea 3 cups daily.   Pt retired.     Lives at home.      Family History  Problem Relation  Age of Onset  . Hypertension Father   . Lung cancer Father     Past Medical History:  Diagnosis Date  . Allergy   . Asthma   . BPH (benign prostatic hypertrophy)   . CKD (chronic kidney disease) stage 3, GFR 30-59 ml/min   . Cough   . Gout   . Hypertensive chronic kidney disease   . Melanoma in situ of right eyelid (  [place])   . OSA (obstructive sleep apnea)   . Persistent atrial fibrillation [facility])     Past Surgical History:  Procedure Laterality Date  . CARDIOVERSION [name] 06/05/2015   Procedure: CARDIOVERSION;  Surgeon: Sanda Klein, MD;  Location: [facility];  Service: Cardiovascular;  Laterality: [name];  . EYE SURGERY      Current Outpatient Prescriptions  Medication Sig Dispense Refill  . acetaminophen (TYLENOL) 500 MG tablet Take 1,000 mg by mouth daily as needed for mild pain or headache.    . albuterol (PROAIR HFA) 108 (90 Base) MCG/ACT inhaler Inhale 2 puffs into the lungs every 4 (four) hours as needed for wheezing or shortness of breath (cough).    . budesonide-formoterol (SYMBICORT) 160-4.5 MCG/ACT inhaler Inhale 2 puffs into the lungs 2 (two) times daily.    . butalbital-acetaminophen-caffeine (FIORICET, ESGIC) 50-325-40 MG tablet Take 1 tablet by mouth every 4 (four) hours as needed (severe headache). Maximum 4 tablets per day  5  . carvedilol (COREG) 25 MG tablet Take 1 tablet (25 mg total) by mouth 2 (two) times daily with a meal. 180 tablet 1  . clobetasol cream (TEMOVATE) [id number] % Apply 1 application topically daily.     Marland Kitchen dofetilide (TIKOSYN) 125 MCG capsule TAKE 1 CAPSULE (125 MCG TOTAL) BY MOUTH 2 (TWO) TIMES DAILY. 60 capsule 3  . EPINEPHrine 0.15 MG/0.15ML IJ injection Inject 0.15 mLs (0.15 mg total) into the muscle as needed for anaphylaxis. (Patient taking differently: Inject 0.15 mg into the muscle once as needed for anaphylaxis. ) 2 Device 0  . fexofenadine (ALLEGRA ALLERGY) 60 MG tablet Take 60 mg by mouth daily.    . fluticasone (FLONASE) 50 MCG/ACT nasal  spray Place 2 sprays into both nostrils at bedtime.   8  . furosemide (LASIX) 20 MG tablet Take 1 tablet (20 mg total) by mouth daily. 30 tablet 11  . gabapentin (NEURONTIN) 300 MG capsule Take 300 mg by mouth at bedtime.    . indomethacin (INDOCIN) 50 MG capsule Take 50 mg by mouth daily as needed (for gout flare up).     . isosorbide mononitrate (IMDUR) 60 MG 24 hr tablet Take 1 tablet (60 mg total) by mouth daily. 90 tablet 2  . Lactobacillus Rhamnosus, GG, (CULTURELLE PO) Take 1 capsule by mouth 2 (two) times daily with a meal.    . losartan (COZAAR) 100 MG tablet TAKE 1 TABLET (100 MG TOTAL) BY MOUTH DAILY. 90 tablet 3  . MAGNESIUM CITRATE PO Take 250 mg by mouth at bedtime.    Vladimir Faster Glycol-Propyl Glycol (SYSTANE PRESERVATIVE FREE) 0.4-0.3 % SOLN Place 1 drop into both eyes 2 (two) times daily.    . potassium chloride SA (K-DUR,KLOR-CON) 20 MEQ tablet Take 1 tablet (20 mEq total) by mouth daily. 30 tablet 11  . Rivaroxaban (XARELTO) 15 MG TABS tablet Take 1 tablet (15 mg total) by mouth daily with supper. (Patient taking differently: Take 15 mg by mouth daily after supper. ) 90 tablet 3  . valACYclovir (VALTREX) 1000 MG tablet Take 1,000 mg by mouth daily after supper.     . zolpidem (AMBIEN) 10 MG tablet Take 10 mg by mouth at bedtime. for sleep  5   No current facility-administered medications for this visit.     Allergies as of 02/03/2016 - Review Complete 02/03/2016  Allergen Reaction Noted  . Bee venom Anaphylaxis 08/10/2015  . Diltiazem Other (See Comments) 06/02/2015  . Codeine Nausea And Vomiting 03/19/2013  . Ivp dye [iodinated  diagnostic agents] Rash 03/19/2013  . Penicillins Swelling and Rash 03/19/2013  . Tape Itching and Rash 06/02/2015    Vitals: BP (!) 143/76   Pulse (!) 54   Ht 5\' 8"  (1.727 m)   Wt 167 lb (75.8 kg)   BMI 25.39 kg/m  Last Weight:  Wt Readings from Last 1 Encounters:  02/03/16 167 lb (75.8 kg)   [id number] mass index is 25.39 kg/m.      Last Height:   Ht Readings from Last 1 Encounters:  02/03/16 5\' 8"  (1.727 m)    Physical exam:  General: The patient is awake, alert and appears not in acute distress. The patient is well groomed. Head: Normocephalic, atraumatic. Neck is supple. Mallampati 3  neck circumference:15. Nasal airflow  unrestricted , TMJ is evident . Retrognathia is not seen.  Cardiovascular:  Regular rate and rhythm , without  murmurs or carotid bruit, and without distended neck veins. Respiratory: Lungs are clear to auscultation. Skin:  Without evidence of edema, or rash Trunk: BMI is normal . The patient's posture is erect  Neurologic exam : The patient is awake and alert, oriented to place and time.   Memory subjective described as intact.  lity appears normal.  Speech is fluent,  with dysphonia or aphasia.  Mood and affect are appropriate.  Cranial nerves: The patient uses a eye cover on the right, ( corneal neovascularization)  his left eye is intact.  Hearing to finger rub intact-  Right ear was surgically altered by melanoma treatment- 15 years ago.    Facial sensation intact to fine touch. Facial motor strength is symmetric and tongue and uvula move midline. Shoulder shrug was symmetrical.   The patient was advised of the nature of the diagnosed sleep disorder , the treatment options and risks for general a health and wellness arising from not treating the condition.  I spent more than 25 minutes of face to face time with the patient. Greater than 50% of time was spent in counseling and coordination of care. We have discussed the diagnosis and differential and I answered the patient's questions.  His ophthalmologic disorder is most troubling to him. Neovascularization has completely covered his cornea, scarred his eyelid.   Assessment:  After physical and neurologic examination, review of laboratory studies,  Personal review of imaging studies, reports of other /same  Imaging studies ,  Results of  polysomnography/ neurophysiology testing and pre-existing records as far as provided in visit., my assessment is   1) Mr. Guernsey has moderate risk factors  for sleep apnea but he is neither obese nor does he have a narrow upper airway. His comorbidities are COPD and asthma and has hypoxemia- the auto CPAP works well for him at 9.9 cm water. A dental device would not work for this patient.   2) nocturnal leg cramps- obtain circulatory data with PCP.  No RLS by description.   3) atrial fibrillation, newly diagnosed in December 2016.  He is on [place]:  Treatment plan and additional workup :  Continue exercise. He joined the [employer].  Exchange Fit F 10 for a smaller size, has worked well .  Rv yearly    Asencion Partridge Nareg Breighner MD  02/03/2016   CC: Burnard Bunting, [place], [place] [address]

## 2016-02-18 DIAGNOSIS — G4733 Obstructive sleep apnea (adult) (pediatric): Secondary | ICD-10-CM | POA: Diagnosis not present

## 2016-02-26 DIAGNOSIS — Z86010 Personal history of colonic polyps: Secondary | ICD-10-CM | POA: Diagnosis not present

## 2016-02-26 DIAGNOSIS — K921 Melena: Secondary | ICD-10-CM | POA: Diagnosis not present

## 2016-02-26 DIAGNOSIS — Z800 Family history of malignant neoplasm of digestive organs: Secondary | ICD-10-CM | POA: Diagnosis not present

## 2016-03-07 ENCOUNTER — Other Ambulatory Visit: Payer: Self-pay | Admitting: Cardiology

## 2016-03-08 NOTE — Progress Notes (Signed)
Electrophysiology Office Note   Date:  03/09/2016   ID:  Austin [name], DOB March 18, 1931, MRN [id number]  PCP:  Geoffery Lyons, MD  Primary Electrophysiologist:  Constance Haw, MD    Chief Complaint  Patient presents with  . Follow-up    Persistent AFib     History of Present Illness: Austin [name] is a 82 y.o. male who presents today for electrophysiology evaluation.   He has a h/o melanoma of rt eyelid with subsequent loss of vision in rt eye, COPD, CKD, recently diagnosed sleep apnea.  He was diagnosed with atrial fibrillation in 03/2016.  He was admitted to the hospital for Tikosyn load at the end of March.  He converted to sinus rhythm while on Tikosyn.    Today, he denies symptoms of palpitations, orthopnea, PND, lower extremity edema, claudication, dizziness, presyncope, syncope, bleeding, or neurologic sequela. The patient is tolerating medications without difficulties and is otherwise without complaint today.  He has been doing well without any major complaints. Tolerating the take [place] and well. Him and his wife have been traveling to [place] and [place] recently without any issues. He had noticed blood in his stool and was guaiac positive. He was seen by his gastroenterologist who told them that there were no issues at the time.   Past Medical History:  Diagnosis Date  . Allergy   . Asthma   . BPH (benign prostatic hypertrophy)   . CKD (chronic kidney disease) stage 3, GFR 30-59 ml/min   . Cough   . Gout   . Hypertensive chronic kidney disease   . Melanoma in situ of right eyelid ([place])   . OSA (obstructive sleep apnea)   . Persistent atrial fibrillation [facility])    Past Surgical History:  Procedure Laterality Date  . CARDIOVERSION [name] 06/05/2015   Procedure: CARDIOVERSION;  Surgeon: Sanda Klein, MD;  [facility];  Service: Cardiovascular;  Laterality: [name];  . EYE SURGERY       Current Outpatient Prescriptions  Medication Sig Dispense  Refill  . acetaminophen (TYLENOL) 500 MG tablet Take 1,000 mg by mouth daily as needed for mild pain or headache.    . albuterol (PROAIR HFA) 108 (90 Base) MCG/ACT inhaler Inhale 2 puffs into the lungs every 4 (four) hours as needed for wheezing or shortness of breath (cough).    . budesonide-formoterol (SYMBICORT) 160-4.5 MCG/ACT inhaler Inhale 2 puffs into the lungs 2 (two) times daily.    . butalbital-acetaminophen-caffeine (FIORICET, ESGIC) 50-325-40 MG tablet Take 1 tablet by mouth every 4 (four) hours as needed (severe headache). Maximum 4 tablets per day  5  . carvedilol (COREG) 25 MG tablet Take 1 tablet (25 mg total) by mouth 2 (two) times daily with a meal. 180 tablet 1  . clobetasol cream (TEMOVATE) [id number] % Apply 1 application topically daily.     Marland Kitchen dofetilide (TIKOSYN) 125 MCG capsule TAKE 1 CAPSULE (125 MCG TOTAL) BY MOUTH 2 (TWO) TIMES DAILY. 180 capsule 1  . EPINEPHrine 0.15 MG/0.15ML IJ injection Inject 0.15 mLs (0.15 mg total) into the muscle as needed for anaphylaxis. (Patient taking differently: Inject 0.15 mg into the muscle once as needed for anaphylaxis. ) 2 Device 0  . fexofenadine (ALLEGRA ALLERGY) 60 MG tablet Take 60 mg by mouth daily.    . fluticasone (FLONASE) 50 MCG/ACT nasal spray Place 2 sprays into both nostrils at bedtime.   8  . furosemide (LASIX) 20 MG tablet Take 1 tablet (20 mg total) by  mouth daily. 30 tablet 11  . gabapentin (NEURONTIN) 300 MG capsule Take 300 mg by mouth at bedtime.    . indomethacin (INDOCIN) 50 MG capsule Take 50 mg by mouth daily as needed (for gout flare up).     . isosorbide mononitrate (IMDUR) 60 MG 24 hr tablet Take 1 tablet (60 mg total) by mouth daily. 90 tablet 2  . Lactobacillus Rhamnosus, GG, (CULTURELLE PO) Take 1 capsule by mouth 2 (two) times daily with a meal.    . losartan (COZAAR) 100 MG tablet TAKE 1 TABLET (100 MG TOTAL) BY MOUTH DAILY. 90 tablet 3  . MAGNESIUM CITRATE PO Take 250 mg by mouth at bedtime.    Vladimir Faster  Glycol-Propyl Glycol (SYSTANE PRESERVATIVE FREE) 0.4-0.3 % SOLN Place 1 drop into both eyes 2 (two) times daily.    . potassium chloride SA (K-DUR,KLOR-CON) 20 MEQ tablet Take 1 tablet (20 mEq total) by mouth daily. 30 tablet 11  . Rivaroxaban (XARELTO) 15 MG TABS tablet Take 1 tablet (15 mg total) by mouth daily with supper. (Patient taking differently: Take 15 mg by mouth daily after supper. ) 90 tablet 3  . valACYclovir (VALTREX) 1000 MG tablet Take 1,000 mg by mouth daily after supper.     . zolpidem (AMBIEN) 10 MG tablet Take 10 mg by mouth at bedtime. for sleep  5   No current facility-administered medications for this visit.     Allergies:   Bee venom; Diltiazem; Codeine; Ivp dye [iodinated diagnostic agents]; Penicillins; and Tape   Social History:  The patient  reports that he has never smoked. He has never used smokeless tobacco. He reports that he drinks about 0.6 oz of alcohol per week . He reports that he does not use drugs.   Family History:  The patient's family history includes Hypertension in his father; Lung cancer in his father.    ROS:  Please see the history of present illness.   Otherwise, review of systems is positive for blood in stool, back pain.   All other systems are reviewed and negative.    PHYSICAL EXAM: VS:  BP 124/82   Pulse 62   Ht 5\' 8"  (1.727 m)   Wt 169 lb 6.4 oz (76.8 kg)   BMI 25.76 kg/m  , BMI Body mass index is 25.76 kg/m. GEN: Well nourished, well developed, in no acute distress  HEENT: normal  Neck: no JVD, carotid bruits, or masses Cardiac: RRR no murmurs, rubs, or gallops,no edema  Respiratory:  clear to auscultation bilaterally, normal work of breathing GI: soft, nontender, nondistended, + BS MS: no deformity or atrophy  Skin: warm and dry Neuro:  Strength and sensation are intact Psych: euthymic mood, full affect  EKG:  EKG is ordered today. Personal review of the ECG ordered  Shows sinus rhythm, APCs, LAFB   Recent  Labs: 06/01/2015: Hemoglobin 14.4; Platelets 344 06/22/2015: Brain Natriuretic Peptide 342.5 08/21/2015: BUN 13; Creatinine, Ser 1.33; Magnesium 2.1; Potassium 4.0; Sodium 143    Lipid Panel  No results found for: CHOL, TRIG, HDL, CHOLHDL, VLDL, LDLCALC, LDLDIRECT   Wt Readings from Last 3 Encounters:  03/09/16 169 lb 6.4 oz (76.8 kg)  02/03/16 167 lb (75.8 kg)  09/09/15 166 lb 9.6 oz (75.6 kg)      Other studies Reviewed: Additional studies/ records that were reviewed today include: TTE 05/18/15  Review of the above records today demonstrates:  - LVEF 60-65%, moderate LVH, normal wall motion , calcified aortic valve with  mild regurgitation, upper normal LA size, mild TR, RVSP 37 mmHg, lipomatous hypertrophy of the IAS, normal IVC.  ETT 06/29/15  Nuclear stress EF: 55%.  There was no ST segment deviation noted during stress. Patient was in atrial fibrillation throughout the study.  There is a small defect of mild severity present in the basal inferolateral and mid inferolateral location. The defect is non-reversible. There is a small defect of mild severity present in the basal inferior and mid inferior location. The defect is non-reversible.There is a small defect of mild severity present in the apex location. The defect is non-reversible. No ischemia noted.  This is a low risk study.  The left ventricular ejection fraction is normal (55-65%).   ASSESSMENT AND PLAN:  1.  Persistent atrial fibrillation: Has been loaded on tikosyn and doing well.  Has had no breakthrough.  Elfida Shimada continue tikosyn at current dose.   He is currently on Xarelto. Tolerating the take [place] and well without any complaints. He is in sinus rhythm today. We'll make no changes. We'll check labs for Tikosyn monitoring.  This patients CHA2DS2-VASc Score and unadjusted Ischemic Stroke Rate (% per year) is equal to 3.2 % stroke rate/year from a score of 3 Above score calculated as 1 point each if present [CHF,  HTN, DM, Vascular=MI/PAD/Aortic Plaque, Age if 65-74, or Male]  2. Hypertension: Well controlled today   Current medicines are reviewed at length with the patient today.   The patient does not have concerns regarding his medicines.  The following changes were made today:  none  Labs/ tests ordered today include:  No orders of the defined types were placed in this encounter.    Disposition:   FU with Kenn Rekowski 6  months  Signed, Kenniya Westrich Meredith Leeds, MD  03/09/2016 10:32 AM     [facility] [address] [place] [place] [address] [phone] (office) [phone] (fax)

## 2016-03-09 ENCOUNTER — Ambulatory Visit (INDEPENDENT_AMBULATORY_CARE_PROVIDER_SITE_OTHER): Payer: Medicare PPO | Admitting: Cardiology

## 2016-03-09 ENCOUNTER — Encounter: Payer: Self-pay | Admitting: Cardiology

## 2016-03-09 ENCOUNTER — Encounter (INDEPENDENT_AMBULATORY_CARE_PROVIDER_SITE_OTHER): Payer: Self-pay

## 2016-03-09 VITALS — BP 124/82 | HR 62 | Ht 68.0 in | Wt 169.4 lb

## 2016-03-09 DIAGNOSIS — Z8679 Personal history of other diseases of the circulatory system: Secondary | ICD-10-CM

## 2016-03-09 DIAGNOSIS — I481 Persistent atrial fibrillation: Secondary | ICD-10-CM

## 2016-03-09 DIAGNOSIS — Z79899 Other long term (current) drug therapy: Secondary | ICD-10-CM

## 2016-03-09 DIAGNOSIS — I4819 Other persistent atrial fibrillation: Secondary | ICD-10-CM

## 2016-03-09 LAB — BASIC METABOLIC PANEL
BUN: 15 mg/dL (ref 7–25)
CALCIUM: 8.9 mg/dL (ref 8.6–10.3)
CO2: 29 mmol/L (ref 20–31)
CREATININE: 1.34 mg/dL — AB (ref 0.70–1.11)
Chloride: 105 mmol/L (ref 98–110)
Glucose, Bld: 95 mg/dL (ref 65–99)
Potassium: 4.4 mmol/L (ref 3.5–5.3)
Sodium: 143 mmol/L (ref 135–146)

## 2016-03-09 LAB — MAGNESIUM: MAGNESIUM: 2 mg/dL (ref 1.5–2.5)

## 2016-03-09 NOTE — Patient Instructions (Signed)
Medication Instructions:    Your physician recommends that you continue on your current medications as directed. Please refer to the Current Medication list given to you today.  --- If you need a refill on your cardiac medications before your next appointment, please call your pharmacy. ---  Labwork:  Medication surveillance labs: Magnesium & Potassium  Testing/Procedures:  None ordered  Follow-Up:  Your physician wants you to follow-up in: 6 months with Dr. Curt Bears.  You will receive a reminder letter in the mail two months in advance. If you don't receive a letter, please call our office to schedule the follow-up appointment.   Thank you for choosing [facility]!!   Trinidad Curet, RN [phone]

## 2016-03-14 ENCOUNTER — Encounter: Payer: Self-pay | Admitting: *Deleted

## 2016-03-14 ENCOUNTER — Encounter: Payer: Self-pay | Admitting: Cardiology

## 2016-03-14 ENCOUNTER — Telehealth: Payer: Self-pay | Admitting: Cardiology

## 2016-03-14 NOTE — Telephone Encounter (Signed)
Advised wife ok for patient to take Singulair/Allegra if needed.  Informed her that neither medication shows an interaction w/ Tikosyn. She thanks me for calling

## 2016-03-14 NOTE — Telephone Encounter (Signed)
New Message  Pt c/o medication issue:  1. Name of Medication: Singular/Tikosyn  2. How are you currently taking this medication (dosage and times per day)?  singular not listed Tikosyn 125 mcg cap twie daily  3. Are you having a reaction (difficulty breathing--STAT)? No  4. What is your medication issue? Pts wife voiced wanting to know if there an interaction with Tikosyn or if pt needs to take Tikosyn since pt takes [place] copy

## 2016-03-14 NOTE — Telephone Encounter (Signed)
This encounter was created in error - please disregard.

## 2016-03-14 NOTE — Telephone Encounter (Signed)
F/u  Patient is returning your call from last week

## 2016-03-15 DIAGNOSIS — Z7901 Long term (current) use of anticoagulants: Secondary | ICD-10-CM | POA: Diagnosis not present

## 2016-03-15 DIAGNOSIS — H02059 Trichiasis without entropian unspecified eye, unspecified eyelid: Secondary | ICD-10-CM | POA: Diagnosis not present

## 2016-03-15 DIAGNOSIS — Z08 Encounter for follow-up examination after completed treatment for malignant neoplasm: Secondary | ICD-10-CM | POA: Diagnosis not present

## 2016-03-15 DIAGNOSIS — J45909 Unspecified asthma, uncomplicated: Secondary | ICD-10-CM | POA: Diagnosis not present

## 2016-03-15 DIAGNOSIS — Z79899 Other long term (current) drug therapy: Secondary | ICD-10-CM | POA: Diagnosis not present

## 2016-03-15 DIAGNOSIS — H16239 Neurotrophic keratoconjunctivitis, unspecified eye: Secondary | ICD-10-CM | POA: Diagnosis not present

## 2016-03-15 DIAGNOSIS — I10 Essential (primary) hypertension: Secondary | ICD-10-CM | POA: Diagnosis not present

## 2016-03-15 DIAGNOSIS — C6900 Malignant neoplasm of unspecified conjunctiva: Secondary | ICD-10-CM | POA: Diagnosis not present

## 2016-03-15 DIAGNOSIS — Z7951 Long term (current) use of inhaled steroids: Secondary | ICD-10-CM | POA: Diagnosis not present

## 2016-03-15 DIAGNOSIS — I4891 Unspecified atrial fibrillation: Secondary | ICD-10-CM | POA: Diagnosis not present

## 2016-03-15 DIAGNOSIS — Z85820 Personal history of malignant melanoma of skin: Secondary | ICD-10-CM | POA: Diagnosis not present

## 2016-03-15 DIAGNOSIS — Z86008 Personal history of in-situ neoplasm of other site: Secondary | ICD-10-CM | POA: Diagnosis not present

## 2016-03-20 DIAGNOSIS — G4733 Obstructive sleep apnea (adult) (pediatric): Secondary | ICD-10-CM | POA: Diagnosis not present

## 2016-03-31 ENCOUNTER — Other Ambulatory Visit: Payer: Self-pay | Admitting: Cardiology

## 2016-04-14 DIAGNOSIS — G4733 Obstructive sleep apnea (adult) (pediatric): Secondary | ICD-10-CM | POA: Diagnosis not present

## 2016-04-18 ENCOUNTER — Other Ambulatory Visit: Payer: Self-pay | Admitting: Physician Assistant

## 2016-04-18 DIAGNOSIS — I10 Essential (primary) hypertension: Secondary | ICD-10-CM

## 2016-04-18 DIAGNOSIS — N183 Chronic kidney disease, stage 3 unspecified: Secondary | ICD-10-CM

## 2016-04-18 DIAGNOSIS — I4819 Other persistent atrial fibrillation: Secondary | ICD-10-CM

## 2016-04-30 ENCOUNTER — Other Ambulatory Visit: Payer: Self-pay | Admitting: Physician Assistant

## 2016-04-30 DIAGNOSIS — N183 Chronic kidney disease, stage 3 unspecified: Secondary | ICD-10-CM

## 2016-04-30 DIAGNOSIS — I10 Essential (primary) hypertension: Secondary | ICD-10-CM

## 2016-05-02 NOTE — Telephone Encounter (Signed)
Medication Detail    Disp Refills Start End   furosemide (LASIX) 20 MG tablet 30 tablet 11 06/22/2015    Sig - Route: Take 1 tablet (20 mg total) by mouth daily. - Oral   E-Prescribing Status: Receipt confirmed by pharmacy (06/22/2015 9:55 AM EST)   Associated Diagnoses   Essential hypertension     CKD (chronic kidney disease), stage 3 (moderate)     Pharmacy   [facility] #[id number] - [place], [place]

## 2016-05-04 DIAGNOSIS — H30141 Acute posterior multifocal placoid pigment epitheliopathy, right eye: Secondary | ICD-10-CM | POA: Diagnosis not present

## 2016-05-04 DIAGNOSIS — H16231 Neurotrophic keratoconjunctivitis, right eye: Secondary | ICD-10-CM | POA: Diagnosis not present

## 2016-05-12 ENCOUNTER — Other Ambulatory Visit: Payer: Self-pay | Admitting: Physician Assistant

## 2016-05-12 DIAGNOSIS — I4819 Other persistent atrial fibrillation: Secondary | ICD-10-CM

## 2016-05-12 DIAGNOSIS — I10 Essential (primary) hypertension: Secondary | ICD-10-CM

## 2016-05-12 DIAGNOSIS — N183 Chronic kidney disease, stage 3 unspecified: Secondary | ICD-10-CM

## 2016-05-12 NOTE — Telephone Encounter (Signed)
KLOR-CON M20 20 MEQ tablet  Medication  Date: 04/19/2016 Department: [address] Office Ordering/Authorizing: Liliane Shi, PA-C  Order Providers   Prescribing Provider Encounter Provider  Liliane Shi, PA-C Liliane Shi, PA-C  Medication Detail    Disp Refills Start End   KLOR-CON M20 20 MEQ tablet 30 tablet 10 04/19/2016    Sig - Route: TAKE 1 TABLET (20 MEQ TOTAL) BY MOUTH DAILY. - Oral   E-Prescribing Status: Receipt confirmed by pharmacy (04/19/2016 8:00 AM EST)   Associated Diagnoses   Persistent atrial fibrillation ([place])     Essential hypertension     Stage 3 chronic kidney disease     Pharmacy   [facility][id number] - [place], [place]

## 2016-05-31 IMAGING — NM NM MISC PROCEDURE
6 series · 36 of 36 positions shown · non-contrast
Comparison: none

[Series 1: wbr_s-proj_st stress-gsp · 6.40mm/px · 6 of 512 frames shown]
[frame 43/512]
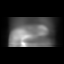
[frame 128/512]
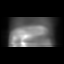
[frame 214/512]
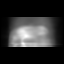
[frame 299/512]
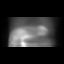
[frame 384/512]
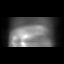
[frame 470/512]
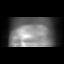

[Series 1: rest · 6.40mm/px · 6 of 64 frames shown]
[frame 6/64]
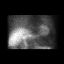
[frame 16/64]
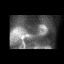
[frame 27/64]
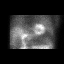
[frame 38/64]
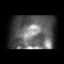
[frame 48/64]
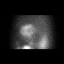
[frame 59/64]
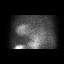

[Series 1: stress-gsp · 6.40mm/px · 6 of 512 frames shown]
[frame 43/512]
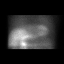
[frame 128/512]
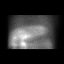
[frame 214/512]
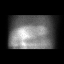
[frame 299/512]
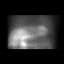
[frame 384/512]
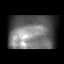
[frame 470/512]
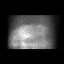

[Series 1: wbr_s-proj_st stress-sum-em · 6.40mm/px · 6 of 64 frames shown]
[frame 6/64]
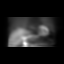
[frame 16/64]
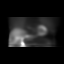
[frame 27/64]
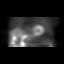
[frame 38/64]
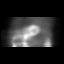
[frame 48/64]
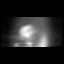
[frame 59/64]
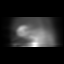

[Series 1: wbr_r-proj_st rest · 6.40mm/px · 6 of 64 frames shown]
[frame 6/64]
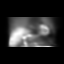
[frame 16/64]
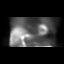
[frame 27/64]
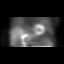
[frame 38/64]
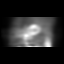
[frame 48/64]
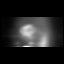
[frame 59/64]
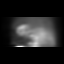

[Series 1: stress-sum-em · 6.40mm/px · 6 of 64 frames shown]
[frame 6/64]
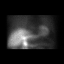
[frame 16/64]
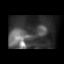
[frame 27/64]
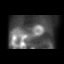
[frame 38/64]
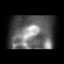
[frame 48/64]
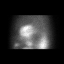
[frame 59/64]
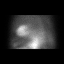

[36 of 36 positions shown; findings below may reference images not displayed]

Canned report from images found in remote index.

Refer to host system for actual result text.

## 2016-06-23 ENCOUNTER — Other Ambulatory Visit: Payer: Self-pay | Admitting: Cardiology

## 2016-06-29 ENCOUNTER — Other Ambulatory Visit (HOSPITAL_COMMUNITY): Payer: Self-pay | Admitting: Nurse Practitioner

## 2016-06-29 NOTE — Telephone Encounter (Signed)
Refill requested on Xarelto 15mg  daily; Labs on 03/09/16-Crea-1.34, Wt-76.8kg, CrCl-45.37, last seen by Dr. Curt Bears on 03/09/16 & due to follow-up with Dr. Curt Bears in April. Refill sent as requested.

## 2016-07-28 ENCOUNTER — Other Ambulatory Visit: Payer: Self-pay | Admitting: Physician Assistant

## 2016-07-28 DIAGNOSIS — N183 Chronic kidney disease, stage 3 unspecified: Secondary | ICD-10-CM

## 2016-07-28 DIAGNOSIS — I10 Essential (primary) hypertension: Secondary | ICD-10-CM

## 2016-08-24 ENCOUNTER — Encounter: Payer: Self-pay | Admitting: Cardiology

## 2016-08-31 ENCOUNTER — Other Ambulatory Visit: Payer: Self-pay | Admitting: Cardiology

## 2016-09-12 ENCOUNTER — Encounter: Payer: Self-pay | Admitting: Cardiology

## 2016-09-12 ENCOUNTER — Other Ambulatory Visit: Payer: Self-pay | Admitting: *Deleted

## 2016-09-12 ENCOUNTER — Ambulatory Visit (INDEPENDENT_AMBULATORY_CARE_PROVIDER_SITE_OTHER): Payer: Medicare Other | Admitting: Cardiology

## 2016-09-12 ENCOUNTER — Encounter (INDEPENDENT_AMBULATORY_CARE_PROVIDER_SITE_OTHER): Payer: Self-pay

## 2016-09-12 VITALS — BP 128/84 | HR 54 | Ht 69.0 in | Wt 169.2 lb

## 2016-09-12 DIAGNOSIS — I4819 Other persistent atrial fibrillation: Secondary | ICD-10-CM

## 2016-09-12 DIAGNOSIS — I481 Persistent atrial fibrillation: Secondary | ICD-10-CM | POA: Diagnosis not present

## 2016-09-12 MED ORDER — CARVEDILOL 12.5 MG PO TABS
12.5000 mg | ORAL_TABLET | Freq: Two times a day (BID) | ORAL | 3 refills | Status: DC
Start: 2016-09-12 — End: 2016-09-12

## 2016-09-12 MED ORDER — CARVEDILOL 12.5 MG PO TABS: 12.5000 mg | ORAL_TABLET | Freq: Two times a day (BID) | ORAL | 3 refills | Status: DC

## 2016-09-12 NOTE — Progress Notes (Signed)
Electrophysiology Office Note   Date:  09/12/2016   ID:  Austin [name], DOB 08-07-1930, MRN [id number]  PCP:  Geoffery Lyons, MD  Primary Electrophysiologist:  Constance Haw, MD    Chief Complaint  Patient presents with  . Follow-up    Persistent Afib     History of Present Illness: Austin [name] is a 83 y.o. male who presents today for electrophysiology evaluation.   He has a h/o melanoma of rt eyelid with subsequent loss of vision in rt eye, COPD, CKD, recently diagnosed sleep apnea.  He was diagnosed with atrial fibrillation in 03/2016.  As admitted to the hospital for Tikosyn administration. Converted to sinus rhythm on Tikosyn.   Today, he denies symptoms of palpitations, chest pain, shortness of breath, PND, orthopnea, or syncope. He does complain of fatigue. He recently went on a trip to the [facility], and feels like he might have overdone it. He has been compliant with his CPAP. His main complaint is of tiredness during the day. He says that he wants to take naps most of the time. When he sits down he falls asleep often. This is occurred since his trip to the [facility].   Past Medical History:  Diagnosis Date  . Allergy   . Asthma   . BPH (benign prostatic hypertrophy)   . CKD (chronic kidney disease) stage 3, GFR 30-59 ml/min   . Cough   . Gout   . Hypertensive chronic kidney disease   . Melanoma in situ of right eyelid ([place])   . OSA (obstructive sleep apnea)   . Persistent atrial fibrillation [facility])    Past Surgical History:  Procedure Laterality Date  . CARDIOVERSION [name] 06/05/2015   Procedure: CARDIOVERSION;  Surgeon: Sanda Klein, MD;  [facility];  Service: Cardiovascular;  Laterality: [name];  . EYE SURGERY       Current Outpatient Prescriptions  Medication Sig Dispense Refill  . acetaminophen (TYLENOL) 500 MG tablet Take 1,000 mg by mouth daily as needed for mild pain or headache.    . albuterol (PROAIR HFA) 108 (90 Base)  MCG/ACT inhaler Inhale 2 puffs into the lungs every 4 (four) hours as needed for wheezing or shortness of breath (cough).    . budesonide-formoterol (SYMBICORT) 160-4.5 MCG/ACT inhaler Inhale 2 puffs into the lungs 2 (two) times daily.    . butalbital-acetaminophen-caffeine (FIORICET, ESGIC) 50-325-40 MG tablet Take 1 tablet by mouth every 4 (four) hours as needed (severe headache). Maximum 4 tablets per day  5  . clobetasol cream (TEMOVATE) 8.93 % Apply 1 application topically daily.     Marland Kitchen dofetilide (TIKOSYN) 125 MCG capsule Take 1 capsule (125 mcg total) by mouth 2 (two) times daily. 180 capsule 1  . EPINEPHrine 0.15 MG/0.15ML IJ injection Inject 0.15 mg into the muscle as needed for anaphylaxis.    . fexofenadine (ALLEGRA ALLERGY) 60 MG tablet Take 60 mg by mouth daily.    . fluticasone (FLONASE) 50 MCG/ACT nasal spray Place 2 sprays into both nostrils at bedtime.   8  . furosemide (LASIX) 20 MG tablet TAKE 1 TABLET BY MOUTH EVERY DAY 30 tablet 7  . gabapentin (NEURONTIN) 300 MG capsule Take 300 mg by mouth at bedtime.    . indomethacin (INDOCIN) 50 MG capsule Take 50 mg by mouth daily as needed (for gout flare up).     . isosorbide mononitrate (IMDUR) 60 MG 24 hr tablet TAKE 1 TABLET BY MOUTH EVERY DAY 90 tablet 2  .  KLOR-CON M20 20 MEQ tablet TAKE 1 TABLET (20 MEQ TOTAL) BY MOUTH DAILY. 30 tablet 10  . Lactobacillus Rhamnosus, GG, (CULTURELLE PO) Take 1 capsule by mouth 2 (two) times daily with a meal.    . losartan (COZAAR) 100 MG tablet TAKE 1 TABLET (100 MG TOTAL) BY MOUTH DAILY. 90 tablet 3  . MAGNESIUM CITRATE PO Take 250 mg by mouth at bedtime.    Vladimir Faster Glycol-Propyl Glycol (SYSTANE PRESERVATIVE FREE) 0.4-0.3 % SOLN Place 1 drop into both eyes 2 (two) times daily.    . Rivaroxaban (XARELTO) 15 MG TABS tablet Take 15 mg by mouth 2 (two) times daily with a meal.    . valACYclovir (VALTREX) 1000 MG tablet Take 1,000 mg by mouth daily after supper.     . zolpidem (AMBIEN) 10 MG  tablet Take 10 mg by mouth at bedtime. for sleep  5   No current facility-administered medications for this visit.     Allergies:   Bee venom; Diltiazem; Codeine; Ivp dye [iodinated diagnostic agents]; Penicillins; and Tape   Social History:  The patient  reports that he has never smoked. He has never used smokeless tobacco. He reports that he drinks about 0.6 oz of alcohol per week . He reports that he does not use drugs.   Family History:  The patient's family history includes Hypertension in his father; Lung cancer in his father.    ROS:  Please see the history of present illness.   Otherwise, review of systems is positive for Dyspnea on exertion, wheezing.   All other systems are reviewed and negative.    PHYSICAL EXAM: VS:  BP 128/84   Pulse (!) 54   Ht 5\' 9"  (1.753 m)   Wt 169 lb 3.2 oz (76.7 kg)   BMI 24.99 kg/m  , BMI Body mass index is 24.99 kg/m. GEN: Well nourished, well developed, in no acute distress  HEENT: normal  Neck: no JVD, carotid bruits, or masses Cardiac: RRR; no murmurs, rubs, or gallops,no edema  Respiratory:  clear to auscultation bilaterally, normal work of breathing GI: soft, nontender, nondistended, + BS MS: no deformity or atrophy  Skin: warm and dry Neuro:  Strength and sensation are intact Psych: euthymic mood, full affect   EKG:  EKG is ordered today. Review of the EKG ordered today shows sinus rhythm, rate 54, left axis deviation, QTc 466  Recent Labs: 03/09/2016: BUN 15; Creat 1.34; Magnesium 2.0; Potassium 4.4; Sodium 143    Lipid Panel  No results found for: CHOL, TRIG, HDL, CHOLHDL, VLDL, LDLCALC, LDLDIRECT   Wt Readings from Last 3 Encounters:  09/12/16 169 lb 3.2 oz (76.7 kg)  03/09/16 169 lb 6.4 oz (76.8 kg)  02/03/16 167 lb (75.8 kg)      Other studies Reviewed: Additional studies/ records that were reviewed today include: TTE 05/18/15  Review of the above records today demonstrates:  - LVEF 60-65%, moderate LVH, normal  wall motion , calcified aortic valve with mild regurgitation, upper normal LA size, mild TR, RVSP 37 mmHg, lipomatous hypertrophy of the IAS, normal IVC.  ETT 06/29/15  Nuclear stress EF: 55%.  There was no ST segment deviation noted during stress. Patient was in atrial fibrillation throughout the study.  There is a small defect of mild severity present in the basal inferolateral and mid inferolateral location. The defect is non-reversible. There is a small defect of mild severity present in the basal inferior and mid inferior location. The defect is non-reversible.There is  a small defect of mild severity present in the apex location. The defect is non-reversible. No ischemia noted.  This is a low risk study.  The left ventricular ejection fraction is normal (55-65%).   ASSESSMENT AND PLAN:  1.  Persistent atrial fibrillation: On Tikosyn and Xarelto. He also takes carvedilol, and his heart rate is low at 50 for today. He is complaining of quite a bit of fatigue. He is on maximum dose of carvedilol, and we'll therefore decrease his dose to 12.5 mg.  This patients CHA2DS2-VASc Score and unadjusted Ischemic Stroke Rate (% per year) is equal to 3.2 % stroke rate/year from a score of 3 Above score calculated as 1 point each if present [CHF, HTN, DM, Vascular=MI/PAD/Aortic Plaque, Age if 65-74, or Male]  2. Hypertension: Well-controlled today. No changes necessary.  Current medicines are reviewed at length with the patient today.   The patient does not have concerns regarding his medicines.  The following changes were made today:  Decrease carvedilol dose  Labs/ tests ordered today include:  No orders of the defined types were placed in this encounter.    Disposition:   FU with Austin [name] 6  months  Signed, Austin Katzenberger Meredith Leeds, MD  09/12/2016 11:45 AM     [facility] [address] [place] [address] [phone] (office) [phone] (fax)

## 2016-09-12 NOTE — Patient Instructions (Signed)
Medication Instructions:   Your physician has recommended you make the following change in your medication:  1) DECREASE Carvedilol to 12.5 mg twice daily  Labwork:  None ordered  Testing/Procedures:  None ordered  Follow-Up:  Your physician wants you to follow-up in: 6 months with Dr. Curt Bears.  You will receive a reminder letter in the mail two months in advance. If you don't receive a letter, please call our office to schedule the follow-up appointment.  - If you need a refill on your cardiac medications before your next appointment, please call your pharmacy.   Thank you for choosing [facility]!!   Trinidad Curet, RN [phone]

## 2016-11-02 ENCOUNTER — Other Ambulatory Visit: Payer: Self-pay | Admitting: Cardiology

## 2016-11-21 ENCOUNTER — Other Ambulatory Visit: Payer: Self-pay | Admitting: Cardiology

## 2016-12-22 ENCOUNTER — Telehealth: Payer: Self-pay | Admitting: Cardiology

## 2016-12-22 NOTE — Telephone Encounter (Signed)
New message    Needs to know pt diagnosis and , last ejection fraction and bp, please leave message on vm

## 2016-12-26 NOTE — Telephone Encounter (Signed)
Vm Left for Gwyndolyn Saxon Need their EF% form faxed over.

## 2016-12-27 ENCOUNTER — Other Ambulatory Visit (HOSPITAL_COMMUNITY): Payer: Self-pay | Admitting: Nurse Practitioner

## 2016-12-27 NOTE — Telephone Encounter (Signed)
Follow up    Juliann Pulse from [facility] is calling to see if pt has a diagnosis of CHF, his recent EF%, and most recent BP. She said if she does not answer for return call, voicemail is confidential and voicemail can be left.

## 2016-12-28 NOTE — Telephone Encounter (Signed)
Left Voicemail for Juliann Pulse with [facility] needing their Ef% form faxed over so records can be sent.

## 2017-01-16 ENCOUNTER — Other Ambulatory Visit: Payer: Self-pay | Admitting: Physician Assistant

## 2017-01-16 DIAGNOSIS — N183 Chronic kidney disease, stage 3 unspecified: Secondary | ICD-10-CM

## 2017-01-16 DIAGNOSIS — I10 Essential (primary) hypertension: Secondary | ICD-10-CM

## 2017-01-27 LAB — IFOBT (OCCULT BLOOD): IMMUNOLOGICAL FECAL OCCULT BLOOD TEST: NEGATIVE

## 2017-02-06 ENCOUNTER — Ambulatory Visit: Payer: BC Managed Care – PPO | Admitting: Adult Health

## 2017-02-22 ENCOUNTER — Other Ambulatory Visit: Payer: Self-pay | Admitting: Physician Assistant

## 2017-02-22 DIAGNOSIS — I10 Essential (primary) hypertension: Secondary | ICD-10-CM

## 2017-02-22 DIAGNOSIS — N183 Chronic kidney disease, stage 3 unspecified: Secondary | ICD-10-CM

## 2017-02-22 DIAGNOSIS — I4819 Other persistent atrial fibrillation: Secondary | ICD-10-CM

## 2017-03-02 ENCOUNTER — Encounter: Payer: Self-pay | Admitting: Neurology

## 2017-03-07 ENCOUNTER — Encounter: Payer: Self-pay | Admitting: Adult Health

## 2017-03-07 ENCOUNTER — Ambulatory Visit (INDEPENDENT_AMBULATORY_CARE_PROVIDER_SITE_OTHER): Payer: Medicare Other | Admitting: Adult Health

## 2017-03-07 VITALS — BP 128/75 | HR 64 | Ht 69.0 in | Wt 168.4 lb

## 2017-03-07 DIAGNOSIS — Z9989 Dependence on other enabling machines and devices: Secondary | ICD-10-CM

## 2017-03-07 DIAGNOSIS — G4733 Obstructive sleep apnea (adult) (pediatric): Secondary | ICD-10-CM | POA: Diagnosis not present

## 2017-03-07 NOTE — Progress Notes (Signed)
PATIENT: Austin [name] DOB: 02-12-1931  REASON FOR VISIT: follow up- osa on cpap HISTORY FROM: patient  HISTORY OF PRESENT ILLNESS:  Today 03/07/17: Austin [name] is an 83 year old male with a history of obstructive sleep apnea on CPAP. He returns today for a compliance download. His download indicates that he uses machine 30 out of 30 days for compliance of 100%. He uses machine greater than 4 hours 29 out of 30 days for compliance of 97. On average he uses his machine 7 hours and 32 minutes. His residual AHI is 1.3 on a minimum pressure of 6 cm water and max pressure 10 cm water with EPR 2. He does not have a significant leak. He does report that he continues to have some daytime sleepiness however he contributes this to his medications. He returns today for an evaluation.  HISTORY 02/03/16: Austin [name] is here today with an excellent 100% compliance on CPAP and a residual AHI of 1.3 using an AutoSet with a minimum pressure of 6 cm, maximum pressure of 10 cm and 2 cm expiratory pressure relief. He reports that he recently participated in a bus to her and traveled through [place], every night the company state at a different hotel and traveling with the CPAP was arduous. He has excellent compliance but I informed him that I would not be insisting on him using CPAP for a 34 or 5 day bus trip considering that the machine can also be damaged. He endorsed today and low-grade of the fatigue severity score of 25 points and the Epworth sleepiness score was endorsed 10 points. He is slightly hoarse , seasonal allergies bother him.   REVIEW OF SYSTEMS: Out of a complete 14 system review of symptoms, the patient complains only of the following symptoms, and all other reviewed systems are negative.  Activity change, eye itching, light sensitivity, wheezing, shortness of breath, environmental allergies, bruise/bleed easily, itching  ALLERGIES: Allergies  Allergen Reactions  . Bee Venom  Anaphylaxis  . Diltiazem Other (See Comments)    Severe headaches 120 mg BID  . Codeine Nausea And Vomiting  . Ivp Dye [Iodinated Diagnostic Agents] Rash  . Penicillins Swelling and Rash    Has patient had a PCN reaction causing immediate rash, facial/tongue/throat swelling, SOB or lightheadedness with hypotension: Yes Has patient had a PCN reaction causing severe rash involving mucus membranes or skin necrosis: No Has patient had a PCN reaction that required hospitalization No Has patient had a PCN reaction occurring within the last 10 years: No If all of the above answers are "NO", then may proceed with Cephalosporin use.  . Tape Itching and Rash    Redness.  Please use "paper" tape only    HOME MEDICATIONS: Outpatient Medications Prior to Visit  Medication Sig Dispense Refill  . acetaminophen (TYLENOL) 500 MG tablet Take 1,000 mg by mouth daily as needed for mild pain or headache.    . albuterol (PROAIR HFA) 108 (90 Base) MCG/ACT inhaler Inhale 2 puffs into the lungs every 4 (four) hours as needed for wheezing or shortness of breath (cough).    . budesonide-formoterol (SYMBICORT) 160-4.5 MCG/ACT inhaler Inhale 2 puffs into the lungs 2 (two) times daily.    . butalbital-acetaminophen-caffeine (FIORICET, ESGIC) 50-325-40 MG tablet Take 1 tablet by mouth every 4 (four) hours as needed (severe headache). Maximum 4 tablets per day  5  . carvedilol (COREG) 12.5 MG tablet Take 1 tablet (12.5 mg total) by mouth 2 (two) times daily.  180 tablet 3  . clobetasol cream (TEMOVATE) 6.94 % Apply 1 application topically daily.     Marland Kitchen dofetilide (TIKOSYN) 125 MCG capsule Take 1 capsule (125 mcg total) by mouth 2 (two) times daily. 180 capsule 1  . EPINEPHrine 0.15 MG/0.15ML IJ injection Inject 0.15 mg into the muscle as needed for anaphylaxis.    . fexofenadine (ALLEGRA ALLERGY) 60 MG tablet Take 60 mg by mouth daily.    . fluticasone (FLONASE) 50 MCG/ACT nasal spray Place 2 sprays into both nostrils at  bedtime.   8  . furosemide (LASIX) 20 MG tablet TAKE 1 TABLET BY MOUTH EVERY DAY 30 tablet 1  . gabapentin (NEURONTIN) 300 MG capsule Take 300 mg by mouth at bedtime.    . indomethacin (INDOCIN) 50 MG capsule Take 50 mg by mouth daily as needed (for gout flare up).     . isosorbide mononitrate (IMDUR) 60 MG 24 hr tablet TAKE 1 TABLET BY MOUTH EVERY DAY 90 tablet 2  . KLOR-CON M20 20 MEQ tablet TAKE 1 TABLET (20 MEQ TOTAL) BY MOUTH DAILY. 30 tablet 6  . Lactobacillus Rhamnosus, GG, (CULTURELLE PO) Take 1 capsule by mouth 2 (two) times daily with a meal.    . losartan (COZAAR) 100 MG tablet TAKE 1 TABLET (100 MG TOTAL) BY MOUTH DAILY. 90 tablet 2  . MAGNESIUM CITRATE PO Take 250 mg by mouth at bedtime.    Vladimir Faster Glycol-Propyl Glycol (SYSTANE PRESERVATIVE FREE) 0.4-0.3 % SOLN Place 1 drop into both eyes 2 (two) times daily.    . valACYclovir (VALTREX) 1000 MG tablet Take 1,000 mg by mouth daily after supper.     [name] 15 MG TABS tablet TAKE 1 TABLET (15 MG TOTAL) BY MOUTH DAILY WITH SUPPER. 90 tablet 1  . zolpidem (AMBIEN) 10 MG tablet Take 10 mg by mouth at bedtime. for sleep  5  . dofetilide (TIKOSYN) 125 MCG capsule Take 1 capsule (125 mcg total) by mouth 2 (two) times daily. 180 capsule 1   No facility-administered medications prior to visit.     PAST MEDICAL HISTORY: Past Medical History:  Diagnosis Date  . Allergy   . Asthma   . BPH (benign prostatic hypertrophy)   . CKD (chronic kidney disease) stage 3, GFR 30-59 ml/min (HCC)   . Cough   . Gout   . Hypertensive chronic kidney disease   . Melanoma in situ of right eyelid ([place])   . OSA (obstructive sleep apnea)   . Persistent atrial fibrillation ([place])     PAST SURGICAL HISTORY: Past Surgical History:  Procedure Laterality Date  . CARDIOVERSION [name] 06/05/2015   Procedure: CARDIOVERSION;  Surgeon: Sanda Klein, MD;  Location: [facility];  Service: Cardiovascular;  Laterality: [name];  . EYE SURGERY      FAMILY  HISTORY: Family History  Problem Relation Age of Onset  . Hypertension Father   . Lung cancer Father     SOCIAL HISTORY: Social History   Social History  . Marital status: Married    Spouse name: [name]  . Number of children: [name]  . Years of education: [name]   Occupational History  . Not on file.   Social History Main Topics  . Smoking status: Never Smoker  . Smokeless tobacco: Never Used  . Alcohol use 0.6 oz/week    1 Standard drinks or equivalent per week     Comment: occ  . Drug use: No  . Sexual activity: Not on file   Other Topics  Concern  . Not on file   Social History Narrative   Right Handed.  Caffiene  Coffee, tea 3 cups daily.   Pt retired.     Lives at home.        PHYSICAL EXAM  Vitals:   03/07/17 0914  BP: 128/75  Pulse: 64  Weight: 168 lb 6.4 oz (76.4 kg)  Height: 5\' 9"  (1.753 m)   Body mass index is 24.87 kg/m.  Generalized: Well developed, in no acute distress   Neurological examination  Mentation: Alert. Cranial nerve II-XII: Pupils were equal round reactive to light. Extraocular movements were full, visual field were full on confrontational test. Facial sensation and strength were normal. Uvula tongue midline. Head turning and shoulder shrug  were normal and symmetric. Motor: The motor testing reveals 5 over 5 strength of all 4 extremities. Good symmetric motor tone is noted throughout.  Sensory: Sensory testing is intact to soft touch on all 4 extremities. No evidence of extinction is noted.  Coordination: Cerebellar testing reveals good finger-nose-finger and heel-to-shin bilaterally.  Gait and station: Gait is normal.    DIAGNOSTIC DATA (LABS, IMAGING, TESTING) - I reviewed patient records, labs, notes, testing and imaging myself where available.  Lab Results  Component Value Date   WBC 8.5 06/01/2015   HGB 14.4 06/01/2015   HCT 42.8 06/01/2015   MCV 92.8 06/01/2015   PLT 344 06/01/2015      Component Value Date/Time   NA 143  03/09/2016 1045   K 4.4 03/09/2016 1045   CL 105 03/09/2016 1045   CO2 29 03/09/2016 1045   GLUCOSE 95 03/09/2016 1045   BUN 15 03/09/2016 1045   CREATININE 1.34 (H) 03/09/2016 1045   CALCIUM 8.9 03/09/2016 1045   GFRNONAA 48 (L) 08/21/2015 1123   GFRAA 56 (L) 08/21/2015 1123      ASSESSMENT AND PLAN 83 y.o. year old male  has a past medical history of Allergy; Asthma; BPH (benign prostatic hypertrophy); CKD (chronic kidney disease) stage 3, GFR 30-59 ml/min ([place]); Cough; Gout; Hypertensive chronic kidney disease; Melanoma in situ of right eyelid ([place]); OSA (obstructive sleep apnea); and Persistent atrial fibrillation ([place]). here with :  1. OSA on CPAP  Overall the patient is doing well. His CPAP download shows excellent compliance with the treatment of his apnea. He is encouraged to continue using his CPAP daily. If his fatigue increases he should let us or his PCP know. However the patient does feel that it is contributed to his medications. He is advised that if his symptoms worsen or he develops new symptoms he should let us know. He will follow-up in 6 months or sooner if needed.  I spent 15 minutes with the patient. 50% of this time was spent reviewing CPAP download   Ward Givens, MSN, NP-C 03/07/2017, 9:39 AM [facility] [address] [place], [place] [address] [phone]

## 2017-03-07 NOTE — Patient Instructions (Signed)
Your Plan:  Continue using CPAP nightly If your symptoms worsen or you develop new symptoms please let us know.   Thank you for coming to see us at [facility]. I hope we have been able to provide you high quality care today.  You may receive a patient satisfaction survey over the next few weeks. We would appreciate your feedback and comments so that we may continue to improve ourselves and the health of our patients.  

## 2017-03-13 ENCOUNTER — Other Ambulatory Visit: Payer: Self-pay

## 2017-03-13 DIAGNOSIS — N183 Chronic kidney disease, stage 3 unspecified: Secondary | ICD-10-CM

## 2017-03-13 DIAGNOSIS — I10 Essential (primary) hypertension: Secondary | ICD-10-CM

## 2017-03-13 MED ORDER — FUROSEMIDE 20 MG PO TABS: 20.0000 mg | ORAL_TABLET | Freq: Every day | ORAL | 0 refills | Status: DC

## 2017-03-14 NOTE — Progress Notes (Signed)
Electrophysiology Office Note   Date:  03/15/2017   ID:  DARELL SAPUTO, DOB 10-Aug-1930, MRN [id number]  PCP:  Burnard Bunting, MD  Primary Electrophysiologist:  Teckla Christiansen Meredith Leeds, MD    Chief Complaint  Patient presents with  . Follow-up    Persistent Afib  . Fatigue     History of Present Illness: Austin [name] is a 83 y.o. male who presents today for electrophysiology evaluation.   He has a h/o melanoma of rt eyelid with subsequent loss of vision in rt eye, COPD, CKD, recently diagnosed sleep apnea.  He was diagnosed with atrial fibrillation in 03/2016.  As admitted to the hospital for Tikosyn administration. Converted to sinus rhythm on Tikosyn.   Today, denies symptoms of palpitations, chest pain, shortness of breath, orthopnea, PND, lower extremity edema, claudication, dizziness, presyncope, syncope, bleeding, or neurologic sequela. The patient is tolerating medications without difficulties.  He continues to complain of fatigue.  This happens approximately 1 day a week.  This is been coming on gradually.  His wife has checked his pulse on the days that he is fatigued and has found him to be in normal rhythm.  He says that he sleeps more often when he has his fatigue days.  Past Medical History:  Diagnosis Date  . Allergy   . Asthma   . BPH (benign prostatic hypertrophy)   . CKD (chronic kidney disease) stage 3, GFR 30-59 ml/min (HCC)   . Cough   . Gout   . Hypertensive chronic kidney disease   . Melanoma in situ of right eyelid ([place])   . OSA (obstructive sleep apnea)   . Persistent atrial fibrillation [facility])    Past Surgical History:  Procedure Laterality Date  . CARDIOVERSION [name] 06/05/2015   Procedure: CARDIOVERSION;  Surgeon: Sanda Klein, MD;  Location: [facility];  Service: Cardiovascular;  Laterality: [name];  . EYE SURGERY       Current Outpatient Prescriptions  Medication Sig Dispense Refill  . acetaminophen (TYLENOL) 500 MG tablet Take 1,000 mg  by mouth daily as needed for mild pain or headache.    . albuterol (PROAIR HFA) 108 (90 Base) MCG/ACT inhaler Inhale 2 puffs into the lungs every 4 (four) hours as needed for wheezing or shortness of breath (cough).    . budesonide-formoterol (SYMBICORT) 160-4.5 MCG/ACT inhaler Inhale 2 puffs into the lungs 2 (two) times daily.    . butalbital-acetaminophen-caffeine (FIORICET, ESGIC) 50-325-40 MG tablet Take 1 tablet by mouth every 4 (four) hours as needed (severe headache). Maximum 4 tablets per day  5  . clobetasol cream (TEMOVATE) 6.19 % Apply 1 application topically daily.     Marland Kitchen dofetilide (TIKOSYN) 125 MCG capsule Take 1 capsule (125 mcg total) by mouth 2 (two) times daily. 180 capsule 1  . EPINEPHrine 0.15 MG/0.15ML IJ injection Inject 0.15 mg into the muscle as needed for anaphylaxis.    . fexofenadine (ALLEGRA ALLERGY) 60 MG tablet Take 60 mg by mouth daily.    . fluticasone (FLONASE) 50 MCG/ACT nasal spray Place 2 sprays into both nostrils at bedtime.   8  . furosemide (LASIX) 20 MG tablet Take 1 tablet (20 mg total) by mouth daily. 30 tablet 0  . gabapentin (NEURONTIN) 300 MG capsule Take 300 mg by mouth at bedtime.    . indomethacin (INDOCIN) 50 MG capsule Take 50 mg by mouth daily as needed (for gout flare up).     . isosorbide mononitrate (IMDUR) 60 MG 24 hr tablet TAKE  1 TABLET BY MOUTH EVERY DAY 90 tablet 2  . KLOR-CON M20 20 MEQ tablet TAKE 1 TABLET (20 MEQ TOTAL) BY MOUTH DAILY. 30 tablet 6  . Lactobacillus Rhamnosus, GG, (CULTURELLE PO) Take 1 capsule by mouth 2 (two) times daily with a meal.    . losartan (COZAAR) 100 MG tablet TAKE 1 TABLET (100 MG TOTAL) BY MOUTH DAILY. 90 tablet 2  . MAGNESIUM CITRATE PO Take 250 mg by mouth at bedtime.    Vladimir Faster Glycol-Propyl Glycol (SYSTANE PRESERVATIVE FREE) 0.4-0.3 % SOLN Place 1 drop into both eyes 2 (two) times daily.    . valACYclovir (VALTREX) 1000 MG tablet Take 1,000 mg by mouth daily after supper.     [name] 15 MG TABS  tablet TAKE 1 TABLET (15 MG TOTAL) BY MOUTH DAILY WITH SUPPER. 90 tablet 1  . zolpidem (AMBIEN) 10 MG tablet Take 10 mg by mouth at bedtime. for sleep  5  . carvedilol (COREG) 6.25 MG tablet Take 1 tablet (6.25 mg total) by mouth 2 (two) times daily. 180 tablet 3   No current facility-administered medications for this visit.     Allergies:   Bee venom; Diltiazem; Codeine; Ivp dye [iodinated diagnostic agents]; Penicillins; and Tape   Social History:  The patient  reports that he has never smoked. He has never used smokeless tobacco. He reports that he drinks about 0.6 oz of alcohol per week . He reports that he does not use drugs.   Family History:  The patient's family history includes Hypertension in his father; Lung cancer in his father.    ROS:  Please see the history of present illness.   Otherwise, review of systems is positive for fatigue.   All other systems are reviewed and negative.   PHYSICAL EXAM: VS:  BP (!) 142/100   Pulse (!) 58   Ht 5\' 9"  (1.753 m)   Wt 166 lb 9.6 oz (75.6 kg)   BMI 24.60 kg/m  , BMI Body mass index is 24.6 kg/m. GEN: Well nourished, well developed, in no acute distress  HEENT: normal  Neck: no JVD, carotid bruits, or masses Cardiac: RRR; no murmurs, rubs, or gallops,no edema  Respiratory:  clear to auscultation bilaterally, normal work of breathing GI: soft, nontender, nondistended, + BS MS: no deformity or atrophy  Skin: warm and dry Neuro:  Strength and sensation are intact Psych: euthymic mood, full affect  EKG:  EKG is ordered today. Personal review of the ekg ordered shows sinus rhythm, rate 58, QTc 451, LAD  Recent Labs: No results found for requested labs within last 8760 hours.    Lipid Panel  No results found for: CHOL, TRIG, HDL, CHOLHDL, VLDL, LDLCALC, LDLDIRECT   Wt Readings from Last 3 Encounters:  03/15/17 166 lb 9.6 oz (75.6 kg)  03/07/17 168 lb 6.4 oz (76.4 kg)  09/12/16 169 lb 3.2 oz (76.7 kg)      Other studies  Reviewed: Additional studies/ records that were reviewed today include: TTE 05/18/15  Review of the above records today demonstrates:  - LVEF 60-65%, moderate LVH, normal wall motion , calcified aortic valve with mild regurgitation, upper normal LA size, mild TR, RVSP 37 mmHg, lipomatous hypertrophy of the IAS, normal IVC.  ETT 06/29/15  Nuclear stress EF: 55%.  There was no ST segment deviation noted during stress. Patient was in atrial fibrillation throughout the study.  There is a small defect of mild severity present in the basal inferolateral and mid inferolateral  location. The defect is non-reversible. There is a small defect of mild severity present in the basal inferior and mid inferior location. The defect is non-reversible.There is a small defect of mild severity present in the apex location. The defect is non-reversible. No ischemia noted.  This is a low risk study.  The left ventricular ejection fraction is normal (55-65%).   ASSESSMENT AND PLAN:  1.  Persistent atrial fibrillation: Currently on decreasing his Xarelto.  He remains in sinus rhythm.  We Judyann Casasola plan to check a magnesium today.  He had a basic metabolic with a potassium of 4.3 with his primary physician.  This patients CHA2DS2-VASc Score and unadjusted Ischemic Stroke Rate (% per year) is equal to 3.2 % stroke rate/year from a score of 3  2. Hypertension: Mildly elevated today.  We Tawnie Ehresman plan to decrease carvedilol to 6.25 mg.  His blood pressure has been normal at home and on previous visits.  3.  Fatigue: Possibly due to carvedilol, but also could be due to episodes of atrial fibrillation.  Fatigue occurs at least one day a week.  We Matyas Baisley plan to decrease carvedilol to see if this improves his symptoms.  He may require further monitoring down the road.  Current medicines are reviewed at length with the patient today.   The patient does not have concerns regarding his medicines.  The following changes were made  today: Decrease carvedilol  Labs/ tests ordered today include:  Orders Placed This Encounter  Procedures  . Magnesium  . EKG 12-Lead     Disposition:   FU with Lameka Disla 6 months  Signed, Zamier Eggebrecht Meredith Leeds, MD  03/15/2017 9:54 AM     [facility] [address] [place] [address] [phone] (office) [phone] (fax)

## 2017-03-15 ENCOUNTER — Ambulatory Visit (INDEPENDENT_AMBULATORY_CARE_PROVIDER_SITE_OTHER): Payer: Medicare Other | Admitting: Cardiology

## 2017-03-15 ENCOUNTER — Encounter: Payer: Self-pay | Admitting: Cardiology

## 2017-03-15 VITALS — BP 142/100 | HR 58 | Ht 69.0 in | Wt 166.6 lb

## 2017-03-15 DIAGNOSIS — I481 Persistent atrial fibrillation: Secondary | ICD-10-CM | POA: Diagnosis not present

## 2017-03-15 DIAGNOSIS — I10 Essential (primary) hypertension: Secondary | ICD-10-CM

## 2017-03-15 DIAGNOSIS — Z79899 Other long term (current) drug therapy: Secondary | ICD-10-CM

## 2017-03-15 DIAGNOSIS — I4819 Other persistent atrial fibrillation: Secondary | ICD-10-CM

## 2017-03-15 LAB — MAGNESIUM: MAGNESIUM: 2.2 mg/dL (ref 1.6–2.3)

## 2017-03-15 MED ORDER — CARVEDILOL 6.25 MG PO TABS: 6.2500 mg | ORAL_TABLET | Freq: Two times a day (BID) | ORAL | 3 refills | Status: DC

## 2017-03-15 NOTE — Patient Instructions (Addendum)
Medication Instructions:  Your physician has recommended you make the following change in your medication:  1. DECREASE CARVEDILOL to 6.25 mg twice daily  -- If you need a refill on your cardiac medications before your next appointment, please call your pharmacy. --  Labwork: Magnesium level today  Testing/Procedures: None ordered  Follow-Up: Your physician wants you to follow-up in: 6 months with Dr. Curt Bears.  You will receive a reminder letter in the mail two months in advance. If you don't receive a letter, please call our office to schedule the follow-up appointment.   Thank you for choosing [facility]!!   Trinidad Curet, RN [phone]

## 2017-03-22 ENCOUNTER — Other Ambulatory Visit: Payer: Self-pay | Admitting: Cardiology

## 2017-03-22 ENCOUNTER — Telehealth: Payer: Self-pay

## 2017-03-22 NOTE — Telephone Encounter (Signed)
Spoke with patient and gave recent lab results with verbal understanding.

## 2017-03-22 NOTE — Telephone Encounter (Signed)
-----   Message from Will Meredith Leeds, MD sent at 03/16/2017  9:17 AM EDT ----- Normal Mg

## 2017-04-17 ENCOUNTER — Other Ambulatory Visit: Payer: Self-pay | Admitting: Physician Assistant

## 2017-04-17 DIAGNOSIS — N183 Chronic kidney disease, stage 3 unspecified: Secondary | ICD-10-CM

## 2017-04-17 DIAGNOSIS — I10 Essential (primary) hypertension: Secondary | ICD-10-CM

## 2017-06-19 ENCOUNTER — Telehealth: Payer: Self-pay | Admitting: Cardiology

## 2017-06-19 DIAGNOSIS — I4819 Other persistent atrial fibrillation: Secondary | ICD-10-CM

## 2017-06-19 DIAGNOSIS — Z79899 Other long term (current) drug therapy: Secondary | ICD-10-CM

## 2017-06-19 NOTE — Telephone Encounter (Signed)
Pt takes Mg 250mg  daily.  Pt is having cramping and would like to know if he can take 2x daily instead to see if improves symptoms. Informed that I would discuss with Dr. Curt Bears and call them later this week. She is agreeable to plan.

## 2017-06-19 NOTE — Telephone Encounter (Signed)
New message  Pt wife verbalized that she is calling for the RN

## 2017-06-21 NOTE — Telephone Encounter (Signed)
lmtcb   (per [place] - ok to increase to BID x 1 week, follow up Mag lab at the end of that week)

## 2017-06-23 NOTE — Telephone Encounter (Signed)
Advised wife of instructions. Pt will stop by the office next Friday for follow up Magnesium level. Wife verbalized understanding and agreeable to plan.

## 2017-06-26 ENCOUNTER — Telehealth: Payer: Self-pay | Admitting: Cardiology

## 2017-06-26 NOTE — Telephone Encounter (Signed)
New Message     Pt c/o medication issue:  1. Name of Medication: MAGNESIUM CITRATE PO  2. How are you currently taking this medication (dosage and times per day)?   3. Are you having a reaction (difficulty breathing--STAT)? None   4. What is your medication issue? [name] is calling on behalf of spouse to advise that patient will not be increasing his magnesium. He feels that the pain in his hand is from arthritis.

## 2017-06-26 NOTE — Telephone Encounter (Signed)
Pts wife is simply calling to let Sherri RN know that the pt has decided not to take the increase in Mag Citrate, due to he feels this is more arthritic in nature, and not cramping.  Pts wife states that Sherri and Dr. Curt Bears got a call from them that the pt was having bilateral hand cramps, and Dr. Curt Bears advised that he could increase his Mag Citrate from 250 mg po daily at bedtime to 250 mg po bid, and come in for lab this coming up Friday, to check his Mg level.  Wife states that the pt has decided to stay on his current regimen of Mag Citrate 250 mg po daily at bedtime, and cancel his lab appt for this week.  Wife states that the pt feels that his hand pain is coming from arthritis, and not cramping, associated with low Mg level.   Wife wanted to endorse this to [facility] RN, and cancel his lab appt.  Lab appt cancelled by operator.  Informed the pts wife that I will route this message to [facility] RN to make her aware of their decision, upon return to the office.  Wife verbalized understanding and agrees with this plan.

## 2017-06-28 ENCOUNTER — Other Ambulatory Visit (HOSPITAL_COMMUNITY): Payer: Self-pay | Admitting: Nurse Practitioner

## 2017-06-30 ENCOUNTER — Other Ambulatory Visit: Payer: Medicare Other

## 2017-07-15 ENCOUNTER — Other Ambulatory Visit: Payer: Self-pay | Admitting: Cardiology

## 2017-08-05 ENCOUNTER — Other Ambulatory Visit: Payer: Self-pay | Admitting: Cardiology

## 2017-08-23 ENCOUNTER — Other Ambulatory Visit: Payer: Self-pay | Admitting: Physician Assistant

## 2017-08-23 DIAGNOSIS — I10 Essential (primary) hypertension: Secondary | ICD-10-CM

## 2017-08-23 DIAGNOSIS — N183 Chronic kidney disease, stage 3 unspecified: Secondary | ICD-10-CM

## 2017-08-23 DIAGNOSIS — I4819 Other persistent atrial fibrillation: Secondary | ICD-10-CM

## 2017-12-14 HISTORY — PX: EYE SURGERY: SHX253

## 2017-12-21 ENCOUNTER — Other Ambulatory Visit (HOSPITAL_COMMUNITY): Payer: Self-pay | Admitting: Nurse Practitioner

## 2018-01-03 ENCOUNTER — Telehealth: Payer: Self-pay | Admitting: Cardiology

## 2018-01-03 NOTE — Telephone Encounter (Signed)
New Message:   Pt wife states he has a nose bleed last night and she is worried.

## 2018-01-05 ENCOUNTER — Other Ambulatory Visit: Payer: Self-pay | Admitting: Cardiology

## 2018-01-05 DIAGNOSIS — N183 Chronic kidney disease, stage 3 unspecified: Secondary | ICD-10-CM

## 2018-01-05 DIAGNOSIS — I10 Essential (primary) hypertension: Secondary | ICD-10-CM

## 2018-01-08 ENCOUNTER — Other Ambulatory Visit: Payer: Self-pay | Admitting: Cardiology

## 2018-01-12 NOTE — Telephone Encounter (Signed)
Wife reports pt is doing better and no concerns.  Issue r/t to something else. She appreciates the follow up

## 2018-01-27 ENCOUNTER — Other Ambulatory Visit: Payer: Self-pay | Admitting: Cardiology

## 2018-02-12 ENCOUNTER — Other Ambulatory Visit: Payer: Self-pay | Admitting: Physician Assistant

## 2018-02-12 DIAGNOSIS — I10 Essential (primary) hypertension: Secondary | ICD-10-CM

## 2018-02-12 DIAGNOSIS — N183 Chronic kidney disease, stage 3 unspecified: Secondary | ICD-10-CM

## 2018-02-12 DIAGNOSIS — I4819 Other persistent atrial fibrillation: Secondary | ICD-10-CM

## 2018-02-27 ENCOUNTER — Other Ambulatory Visit: Payer: Self-pay | Admitting: Cardiology

## 2018-02-27 ENCOUNTER — Ambulatory Visit: Payer: Medicare Other | Admitting: Cardiology

## 2018-02-27 ENCOUNTER — Encounter: Payer: Self-pay | Admitting: Cardiology

## 2018-02-27 VITALS — BP 162/94 | HR 74 | Ht 69.0 in | Wt 167.0 lb

## 2018-02-27 DIAGNOSIS — I10 Essential (primary) hypertension: Secondary | ICD-10-CM

## 2018-02-27 DIAGNOSIS — I4819 Other persistent atrial fibrillation: Secondary | ICD-10-CM | POA: Diagnosis not present

## 2018-02-27 NOTE — Progress Notes (Signed)
Electrophysiology Office Note   Date:  02/27/2018   ID:  Austin [name], DOB November 09, 1930, MRN [id number]  PCP:  Burnard Bunting, MD  Primary Electrophysiologist:  Vendetta Pittinger Meredith Leeds, MD    No chief complaint on file.    History of Present Illness: Austin [name] is a 84 y.o. male who presents today for electrophysiology evaluation.   He has a h/o melanoma of rt eyelid with subsequent loss of vision in rt eye, COPD, CKD, recently diagnosed sleep apnea.  He was diagnosed with atrial fibrillation in 03/2016.  As admitted to the hospital for Tikosyn administration. Converted to sinus rhythm on Tikosyn.   Today, denies symptoms of palpitations, chest pain, shortness of breath, orthopnea, PND, lower extremity edema, claudication, dizziness, presyncope, syncope, bleeding, or neurologic sequela. The patient is tolerating medications without difficulties.  He is overall feeling well.  He has no chest pain or shortness of breath.  He is able to do all of his daily activities without issue.  Past Medical History:  Diagnosis Date  . Allergy   . Asthma   . BPH (benign prostatic hypertrophy)   . CKD (chronic kidney disease) stage 3, GFR 30-59 ml/min (HCC)   . Cough   . Gout   . Hypertensive chronic kidney disease   . Melanoma in situ of right eyelid ([place])   . OSA (obstructive sleep apnea)   . Persistent atrial fibrillation    Past Surgical History:  Procedure Laterality Date  . CARDIOVERSION [name] 06/05/2015   Procedure: CARDIOVERSION;  Surgeon: Sanda Klein, MD;  Location: [facility];  Service: Cardiovascular;  Laterality: [name];  . EYE SURGERY       Current Outpatient Medications  Medication Sig Dispense Refill  . acetaminophen (TYLENOL) 500 MG tablet Take 1,000 mg by mouth daily as needed for mild pain or headache.    . albuterol (PROAIR HFA) 108 (90 Base) MCG/ACT inhaler Inhale 2 puffs into the lungs every 4 (four) hours as needed for wheezing or shortness of breath (cough).     . budesonide-formoterol (SYMBICORT) 160-4.5 MCG/ACT inhaler Inhale 2 puffs into the lungs 2 (two) times daily.    . butalbital-acetaminophen-caffeine (FIORICET, ESGIC) 50-325-40 MG tablet Take 1 tablet by mouth every 4 (four) hours as needed (severe headache). Maximum 4 tablets per day  5  . carvedilol (COREG) 6.25 MG tablet Take 1 tablet (6.25 mg total) by mouth 2 (two) times daily. 180 tablet 3  . clobetasol cream (TEMOVATE) 2.44 % Apply 1 application topically daily.     Marland Kitchen dofetilide (TIKOSYN) 125 MCG capsule TAKE 1 CAPSULE BY MOUTH TWICE A DAY 180 capsule 0  . EPINEPHrine 0.15 MG/0.15ML IJ injection Inject 0.15 mg into the muscle as needed for anaphylaxis.    . fexofenadine (ALLEGRA ALLERGY) 60 MG tablet Take 60 mg by mouth daily.    . fluticasone (FLONASE) 50 MCG/ACT nasal spray Place 2 sprays into both nostrils at bedtime.   8  . furosemide (LASIX) 20 MG tablet TAKE 1 TABLET BY MOUTH EVERY DAY 90 tablet 0  . gabapentin (NEURONTIN) 300 MG capsule Take 300 mg by mouth at bedtime.    . indomethacin (INDOCIN) 50 MG capsule Take 50 mg by mouth daily as needed (for gout flare up).     . isosorbide mononitrate (IMDUR) 60 MG 24 hr tablet TAKE 1 TABLET BY MOUTH EVERY DAY 90 tablet 3  . Lactobacillus Rhamnosus, GG, (CULTURELLE PO) Take 1 capsule by mouth 2 (two) times daily with a meal.    .  losartan (COZAAR) 100 MG tablet Take 1 tablet (100 mg total) by mouth daily. Pt is due for follow up in October 2019. Please call to schedule. 1st attempt. 90 tablet 0  . MAGNESIUM CITRATE PO Take 250 mg by mouth at bedtime.    Vladimir Faster Glycol-Propyl Glycol (SYSTANE PRESERVATIVE FREE) 0.4-0.3 % SOLN Place 1 drop into both eyes 2 (two) times daily.    . potassium chloride SA (KLOR-CON M20) 20 MEQ tablet Take 1 tablet (20 mEq total) by mouth daily. Please keep upcoming appt in October for future refills. Thank you 90 tablet 0  . valACYclovir (VALTREX) 1000 MG tablet Take 1,000 mg by mouth daily after supper.       [name] 15 MG TABS tablet TAKE 1 TABLET (15 MG TOTAL) BY MOUTH DAILY WITH SUPPER. 90 tablet 1  . zolpidem (AMBIEN) 10 MG tablet Take 10 mg by mouth at bedtime. for sleep  5   No current facility-administered medications for this visit.     Allergies:   Bee venom; Diltiazem; Codeine; Ivp dye [iodinated diagnostic agents]; Penicillins; and Tape   Social History:  The patient  reports that he has never smoked. He has never used smokeless tobacco. He reports that he drinks about 1.0 standard drinks of alcohol per week. He reports that he does not use drugs.   Family History:  The patient's family history includes Hypertension in his father; Lung cancer in his father.   ROS:  Please see the history of present illness.   Otherwise, review of systems is positive for breath, back pain, joint swelling.   All other systems are reviewed and negative.   PHYSICAL EXAM: VS:  BP (!) 162/94   Pulse 74   Ht 5\' 9"  (1.753 m)   Wt 167 lb (75.8 kg)   BMI 24.66 kg/m  , BMI Body mass index is 24.66 kg/m. GEN: Well nourished, well developed, in no acute distress  HEENT: normal  Neck: no JVD, carotid bruits, or masses Cardiac: RRR; no murmurs, rubs, or gallops,no edema  Respiratory:  clear to auscultation bilaterally, normal work of breathing GI: soft, nontender, nondistended, + BS MS: no deformity or atrophy  Skin: warm and dry Neuro:  Strength and sensation are intact Psych: euthymic mood, full affect  EKG:  EKG is ordered today. Personal review of the ekg ordered shows sinus rhythm, left axis deviation, rate 74, QTc 463 ms  Recent Labs: 03/15/2017: Magnesium 2.2    Lipid Panel  No results found for: CHOL, TRIG, HDL, CHOLHDL, VLDL, LDLCALC, LDLDIRECT   Wt Readings from Last 3 Encounters:  02/27/18 167 lb (75.8 kg)  03/15/17 166 lb 9.6 oz (75.6 kg)  03/07/17 168 lb 6.4 oz (76.4 kg)      Other studies Reviewed: Additional studies/ records that were reviewed today include: TTE 05/18/15   Review of the above records today demonstrates:  - LVEF 60-65%, moderate LVH, normal wall motion , calcified aortic valve with mild regurgitation, upper normal LA size, mild TR, RVSP 37 mmHg, lipomatous hypertrophy of the IAS, normal IVC.  ETT 06/29/15  Nuclear stress EF: 55%.  There was no ST segment deviation noted during stress. Patient was in atrial fibrillation throughout the study.  There is a small defect of mild severity present in the basal inferolateral and mid inferolateral location. The defect is non-reversible. There is a small defect of mild severity present in the basal inferior and mid inferior location. The defect is non-reversible.There is a small defect  of mild severity present in the apex location. The defect is non-reversible. No ischemia noted.  This is a low risk study.  The left ventricular ejection fraction is normal (55-65%).   ASSESSMENT AND PLAN:  1.  Persistent atrial fibrillation: On dofetilide and Xarelto.  Remains in sinus rhythm.  Planning to check basic metabolic and magnesium today.  This patients CHA2DS2-VASc Score and unadjusted Ischemic Stroke Rate (% per year) is equal to 3.2 % stroke rate/year from a score of 3  Above score calculated as 1 point each if present [CHF, HTN, DM, Vascular=MI/PAD/Aortic Plaque, Age if 65-74, or Male] Above score calculated as 2 points each if present [Age > 75, or Stroke/TIA/TE]   2. Hypertension: Pressure continues to be elevated.  I have asked him to take his blood pressures at home as him and his wife say it is normal there.  They Austin [name] call us back weeks with recordings.  Current medicines are reviewed at length with the patient today.   The patient does not have concerns regarding his medicines.  The following changes were made today: none  Labs/ tests ordered today include:  Orders Placed This Encounter  Procedures  . EKG 12-Lead     Disposition:   FU with [place]urn Knaus 6 months  Signed, Wynn Kernes  Meredith Leeds, MD  02/27/2018 3:42 PM     [facility] [address] [place] [address] [phone] (office) [phone] (fax)

## 2018-02-27 NOTE — Patient Instructions (Addendum)
Medication Instructions:  Your physician recommends that you continue on your current medications as directed. Please refer to the Current Medication list given to you today.  * If you need a refill on your cardiac medications before your next appointment, please call your pharmacy.   Labwork: None ordered  Testing/Procedures: None ordered  Follow-Up: Your physician wants you to follow-up in: 6 months with Dr. Curt Bears.  You will receive a reminder letter in the mail two months in advance. If you don't receive a letter, please call our office to schedule the follow-up appointment.  Thank you for choosing [facility]!!   Trinidad Curet, RN [phone]  Any Other Special Instructions Will Be Listed Below (If Applicable). Will contact your PCP about lab work.  The nurse will call you if we need more lab work.

## 2018-03-06 ENCOUNTER — Other Ambulatory Visit: Payer: Self-pay | Admitting: Cardiology

## 2018-03-06 MED ORDER — CARVEDILOL 6.25 MG PO TABS: 6.2500 mg | ORAL_TABLET | Freq: Two times a day (BID) | ORAL | 3 refills | Status: DC

## 2018-03-07 ENCOUNTER — Telehealth: Payer: Self-pay | Admitting: Cardiology

## 2018-03-07 NOTE — Telephone Encounter (Signed)
Wife reports BPs, states diastolic is staying in the low 90s.  Also reports systolic elevated.  10/18   10/19 Am 130/87  Am 157/76 Pm 170/90  Pm 167/94  10/20   10/21  Am 126/86  Am 132/90 Pm 161/94  Pm  Didn't take  10/22 Am 152/75 Pm 188/80  Today, after lunch 127/90   Reported BP are after medications were taken. Aware I will have Dr. Curt Bears review and get back w/ them by end of the week. Wife is agreeable to plan.

## 2018-03-07 NOTE — Telephone Encounter (Signed)
New message  Pt c/o BP issue: STAT if pt c/o blurred vision, one-sided weakness or slurred speech  1. What are your last 5 BP readings? 188/80 03/06/18   126/90 03/07/18   2. Are you having any other symptoms (ex. Dizziness, headache, blurred vision, passed out)? No   3. What is your BP issue? Blood pressure is elevated. Patient wife states that she is concerned about his diastolic number staying in the 90's.

## 2018-03-08 ENCOUNTER — Encounter: Payer: Self-pay | Admitting: Neurology

## 2018-03-08 ENCOUNTER — Ambulatory Visit: Payer: Medicare Other | Admitting: Neurology

## 2018-03-08 ENCOUNTER — Telehealth: Payer: Self-pay | Admitting: *Deleted

## 2018-03-08 VITALS — BP 121/73 | HR 86 | Resp 18 | Ht 69.0 in | Wt 167.5 lb

## 2018-03-08 DIAGNOSIS — Z9989 Dependence on other enabling machines and devices: Secondary | ICD-10-CM

## 2018-03-08 DIAGNOSIS — G4733 Obstructive sleep apnea (adult) (pediatric): Secondary | ICD-10-CM | POA: Diagnosis not present

## 2018-03-08 DIAGNOSIS — I4891 Unspecified atrial fibrillation: Secondary | ICD-10-CM | POA: Diagnosis not present

## 2018-03-08 DIAGNOSIS — I4892 Unspecified atrial flutter: Secondary | ICD-10-CM

## 2018-03-08 MED ORDER — CARVEDILOL 12.5 MG PO TABS: 12.5000 mg | ORAL_TABLET | Freq: Two times a day (BID) | ORAL | 3 refills | Status: DC

## 2018-03-08 NOTE — Addendum Note (Signed)
Addended by: Stanton Kidney on: 03/08/2018 06:02 PM   Modules accepted: Orders

## 2018-03-08 NOTE — Telephone Encounter (Signed)
Advised to increase Carvedilol to 12.5 mg twice daily per Dr. Curt Bears. They will continue to monitor and let us know if no improvement and/or SE occur. She is appreciative of my help/call.  She is agreeable to plan.

## 2018-03-08 NOTE — Progress Notes (Signed)
PATIENT: Austin [name] DOB: 1931/04/29  REASON FOR VISIT: follow up- fall from a ladder 02-20-2018 - related to impaired depth perception?  Established patient on CPAP.  HISTORY FROM: patient and wife   HISTORY OF PRESENT ILLNESS:  Today 03/08/18: CD -I have the pleasure of seeing Austin [name]. Austin [name] today and meanwhile 84 year old Caucasian gentleman who underwent a corneal transplant on 20 December 2017, and had been doing very well.  He felt well enough actually to trim some hedges and bushes in his garden, used a ladder but missed 1 of the lower stairs when he descended. Fell 12 inches, landed on his elbows and thorax- not head, not neck.   He fell and related to this injury he had to sleep in a recliner for several days and did not use his CPAP.  He had always been a compliant CPAP user and he is back to his usual pattern, his download for the months shows an 87% compliance, average use of time is 6 hours 28 minutes, if the take out the week of his fall related injury he would reach 7 hours and 27 minutes each night.  He is using an AutoSet between 6 and 10 cmH2O was 2 cm EPR, residual AHI is 2.1/h, this is an excellent resolution and his 95th percentile pressure is 10 cmH2O he does not have major air leaks.  He does not endorse a high degree of fatigue and a fatigue severity score of 31 points, and his Epworth sleepiness score is endorsed at 12 out of 24 points.  He also does not endorse more than one-point out of 15 on his depression scale. I reviewed his medication today he does have some pain pills, hydrocodone- he was nauseated and vomited, but he also developed higher blood pressures- likely due to pain. He could not find relief on tylenol, nor on robaxin.  [place] was [facility].     Austin [name] is an 84 year old male with a history of obstructive sleep apnea on CPAP. He returns today for a compliance download. His download indicates  that he uses machine 30 out of 30 days for compliance of 100%. He uses machine greater than 4 hours 29 out of 30 days for compliance of 97. On average he uses his machine 7 hours and 32 minutes. His residual AHI is 1.3 on a minimum pressure of 6 cm water and max pressure 10 cm water with EPR 2. He does not have a significant leak. He does report that he continues to have some daytime sleepiness however he contributes this to his medications. He returns today for an evaluation.  HISTORY 02/03/16: Austin [name] is here today with an excellent 100% compliance on CPAP and a residual AHI of 1.3 using an AutoSet with a minimum pressure of 6 cm, maximum pressure of 10 cm and 2 cm expiratory pressure relief. He reports that he recently participated in a bus to her and traveled through [facility], every night the company state at a different hotel and traveling with the CPAP was arduous. He has excellent compliance but I informed him that I would not be insisting on him using CPAP for a 34 or 5 day bus trip considering that the machine can also be damaged. He endorsed today and low-grade of the fatigue severity score of 25 points and the Epworth sleepiness score was endorsed 10 points. He is slightly hoarse , seasonal allergies bother him.  REVIEW OF SYSTEMS: Out of a complete 14 system review of symptoms, the patient complains only of the following symptoms, and all other reviewed systems are negative.  Activity change, eye itching, light sensitivity, wheezing, shortness of breath, environmental allergies, bruise/bleed easily, itching- He does not endorse a high degree of fatigue and a fatigue severity score of 31 points, and his Epworth sleepiness score is endorsed at 12 out of 24 points.  He also does not endorse more than one-point out of 15 on his depression scale. I reviewed his medication today he does have some pain pills, hydrocodone- he was nauseated and vomited, but he also developed higher  blood pressures- likely due to pain. He could not find relief on tylenol, nor on robaxin.  He has a dry mouth, hoarse voice.     ALLERGIES: Allergies  Allergen Reactions  . Bee Venom Anaphylaxis  . Diltiazem Other (See Comments)    Severe headaches 120 mg BID  . Codeine Nausea And Vomiting  . Ivp Dye [Iodinated Diagnostic Agents] Rash  . Penicillins Swelling and Rash    Has patient had a PCN reaction causing immediate rash, facial/tongue/throat swelling, SOB or lightheadedness with hypotension: Yes Has patient had a PCN reaction causing severe rash involving mucus membranes or skin necrosis: No Has patient had a PCN reaction that required hospitalization No Has patient had a PCN reaction occurring within the last 10 years: No If all of the above answers are "NO", then may proceed with Cephalosporin use.  . Tape Itching and Rash    Redness.  Please use "paper" tape only    HOME MEDICATIONS: Outpatient Medications Prior to Visit  Medication Sig Dispense Refill  . acetaminophen (TYLENOL) 500 MG tablet Take 1,000 mg by mouth daily as needed for mild pain or headache.    . albuterol (PROAIR HFA) 108 (90 Base) MCG/ACT inhaler Inhale 2 puffs into the lungs every 4 (four) hours as needed for wheezing or shortness of breath (cough).    . budesonide-formoterol (SYMBICORT) 160-4.5 MCG/ACT inhaler Inhale 2 puffs into the lungs 2 (two) times daily.    . butalbital-acetaminophen-caffeine (FIORICET, ESGIC) 50-325-40 MG tablet Take 1 tablet by mouth every 4 (four) hours as needed (severe headache). Maximum 4 tablets per day  5  . carvedilol (COREG) 6.25 MG tablet Take 1 tablet (6.25 mg total) by mouth 2 (two) times daily. 180 tablet 3  . clobetasol cream (TEMOVATE) 2.53 % Apply 1 application topically daily.     Austin Kitchen dofetilide (TIKOSYN) 125 MCG capsule TAKE 1 CAPSULE BY MOUTH TWICE A DAY 180 capsule 0  . EPINEPHrine 0.15 MG/0.15ML IJ injection Inject 0.15 mg into the muscle as needed for anaphylaxis.     . fexofenadine (ALLEGRA ALLERGY) 60 MG tablet Take 60 mg by mouth daily.    . fluticasone (FLONASE) 50 MCG/ACT nasal spray Place 2 sprays into both nostrils at bedtime.   8  . furosemide (LASIX) 20 MG tablet TAKE 1 TABLET BY MOUTH EVERY DAY 90 tablet 0  . gabapentin (NEURONTIN) 300 MG capsule Take 300 mg by mouth at bedtime.    . indomethacin (INDOCIN) 50 MG capsule Take 50 mg by mouth daily as needed (for gout flare up).     . isosorbide mononitrate (IMDUR) 60 MG 24 hr tablet TAKE 1 TABLET BY MOUTH EVERY DAY 90 tablet 3  . Lactobacillus Rhamnosus, GG, (CULTURELLE PO) Take 1 capsule by mouth 2 (two) times daily with a meal.    . losartan (COZAAR) 100 MG  tablet Take 1 tablet (100 mg total) by mouth daily. Pt is due for follow up in October 2019. Please call to schedule. 1st attempt. 90 tablet 0  . MAGNESIUM CITRATE PO Take 250 mg by mouth at bedtime.    Vladimir Faster Glycol-Propyl Glycol (SYSTANE PRESERVATIVE FREE) 0.4-0.3 % SOLN Place 1 drop into both eyes 2 (two) times daily.    . potassium chloride SA (KLOR-CON M20) 20 MEQ tablet Take 1 tablet (20 mEq total) by mouth daily. Please keep upcoming appt in October for future refills. Thank you 90 tablet 0  . valACYclovir (VALTREX) 1000 MG tablet Take 1,000 mg by mouth daily after supper.     Alveda [name] 15 MG TABS tablet TAKE 1 TABLET (15 MG TOTAL) BY MOUTH DAILY WITH SUPPER. 90 tablet 1  . zolpidem (AMBIEN) 10 MG tablet Take 10 mg by mouth at bedtime. for sleep  5   No facility-administered medications prior to visit.     PAST MEDICAL HISTORY: Past Medical History:  Diagnosis Date  . Allergy   . Asthma   . BPH (benign prostatic hypertrophy)   . CKD (chronic kidney disease) stage 3, GFR 30-59 ml/min (HCC)   . Cough   . Gout   . Hypertensive chronic kidney disease   . Melanoma in situ of right eyelid ([place])   . OSA (obstructive sleep apnea)   . Persistent atrial fibrillation     PAST SURGICAL HISTORY: Past Surgical History:  Procedure  Laterality Date  . CARDIOVERSION [name] 06/05/2015   Procedure: CARDIOVERSION;  Surgeon: Sanda Klein, MD;  Location: [facility];  Service: Cardiovascular;  Laterality: [name];  . EYE SURGERY  12/2017    FAMILY HISTORY: Family History  Problem Relation Age of Onset  . Hypertension Father   . Lung cancer Father     SOCIAL HISTORY: Social History   Socioeconomic History  . Marital status: Married    Spouse name: Austin [name]  . Number of children: Not on file  . Years of education: Not on file  . Highest education level: Not on file  Occupational History  . Not on file  Social Needs  . Financial resource strain: Not on file  . Food insecurity:    Worry: Not on file    Inability: Not on file  . Transportation needs:    Medical: Not on file    Non-medical: Not on file  Tobacco Use  . Smoking status: Never Smoker  . Smokeless tobacco: Never Used  Substance and Sexual Activity  . Alcohol use: Yes    Alcohol/week: 1.0 standard drinks    Types: 1 Standard drinks or equivalent per week    Comment: occ  . Drug use: No  . Sexual activity: Not on file  Lifestyle  . Physical activity:    Days per week: Not on file    Minutes per session: Not on file  . Stress: Not on file  Relationships  . Social connections:    Talks on phone: Not on file    Gets together: Not on file    Attends religious service: Not on file    Active member of club or organization: Not on file    Attends meetings of clubs or organizations: Not on file    Relationship status: Not on file  . Intimate partner violence:    Fear of current or ex partner: Not on file    Emotionally abused: Not on file    Physically abused: Not on file  Forced sexual activity: Not on file  Other Topics Concern  . Not on file  Social History Narrative   Right Handed.  Caffiene  Coffee, tea 3 cups daily.   Pt retired.     Lives at home with wife.        PHYSICAL EXAM  Vitals:   03/08/18 0900  BP: 121/73  Pulse: 86    Resp: 18  Weight: 167 lb 8 oz (76 kg)  Height: 5\' 9"  (1.753 m)   Body mass index is 24.74 kg/m.  Generalized: Well developed, in no acute distress  Cardiac rate 86, irregular-    Neurological examination  Mentation: Alert. Cranial nerve : no change in taste or smell - but he is hoarse.  Pupils right pupil abnormal- anterior chamber of the right eye collapsed ( 12-2017) , he had 2 times melanoma in the right eye. Cornea transplant was done 12-2017 . Extraocular movements were full, visual field of the left was full on confrontational test. He has avoided enucleation. His right eye is hyperpigmented. He is able to see some with the right eye now.  Facial sensation and strength were normal. Uvula invisible and  tongue appears deviated to the right. Head turning and shoulder shrug  were symmetric and in normal  ROM.  Motor:  Equal tone and mass in both upper extremities. Good symmetric grip strength. Sensory: Sensory testing is intact to soft touch on all 4 extremities. No evidence of extinction is noted.  Coordination: deferred.  Gait and station: Gait is normal based, can rise without bracing himself.   DIAGNOSTIC DATA (LABS, IMAGING, TESTING) - I reviewed patient records, labs, notes, testing and imaging myself where available.  Lab Results  Component Value Date   WBC 8.5 06/01/2015   HGB 14.4 06/01/2015   HCT 42.8 06/01/2015   MCV 92.8 06/01/2015   PLT 344 06/01/2015      Component Value Date/Time   NA 143 03/09/2016 1045   K 4.4 03/09/2016 1045   CL 105 03/09/2016 1045   CO2 29 03/09/2016 1045   GLUCOSE 95 03/09/2016 1045   BUN 15 03/09/2016 1045   CREATININE 1.34 (H) 03/09/2016 1045   CALCIUM 8.9 03/09/2016 1045   GFRNONAA 48 (L) 08/21/2015 1123   GFRAA 56 (L) 08/21/2015 1123      ASSESSMENT AND PLAN 84 y.o. year old male  has a past medical history of Allergy, Asthma, BPH (benign prostatic hypertrophy), CKD (chronic kidney disease) stage 3, GFR 30-59 ml/min (HCC),  Cough, Gout, Hypertensive chronic kidney disease, Melanoma in situ of right eyelid ([place]), OSA (obstructive sleep apnea), and Persistent atrial fibrillation. here with :  1. OSA on CPAP- very compliant  He reports a dry mouth, needs help to adjust the humidifier of his CPAP. 2. Melanoma in right eye, anterior chamber collapse, irrgeluar iris and pupil shape. Had a corneal transplant. 3. Peak BP at night, witnessed in hospital and may be related to OSA- but his download shows good control. I suspect pain to be cause.  4. Had a fall , attributed to his limited stereo vision, recovering- no head injury     Overall the patient is doing well. His CPAP download shows excellent compliance with the treatment of his apnea. He is encouraged to continue using his CPAP daily. He will follow-up in 12 months or sooner if needed.  I spent 25 minutes with the patient. 50% of this time was spent reviewing CPAP download and records from [place], [place].  03/08/2018, 9:40 AM [facility] [address] [place], [place] [address] [phone]

## 2018-03-08 NOTE — Telephone Encounter (Addendum)
I faxed a medical record request to [facility] requesting pt CT disk of neck + spine, head.R/c medical records + Cd from [facility] records on [place] desk.

## 2018-03-08 NOTE — Patient Instructions (Signed)

## 2018-04-09 ENCOUNTER — Other Ambulatory Visit: Payer: Self-pay | Admitting: Cardiology

## 2018-04-09 DIAGNOSIS — N183 Chronic kidney disease, stage 3 unspecified: Secondary | ICD-10-CM

## 2018-04-09 DIAGNOSIS — I10 Essential (primary) hypertension: Secondary | ICD-10-CM

## 2018-04-13 ENCOUNTER — Other Ambulatory Visit: Payer: Self-pay | Admitting: Cardiology

## 2018-04-24 ENCOUNTER — Other Ambulatory Visit: Payer: Self-pay | Admitting: Cardiology

## 2018-05-14 ENCOUNTER — Other Ambulatory Visit: Payer: Self-pay | Admitting: Physician Assistant

## 2018-05-14 DIAGNOSIS — I10 Essential (primary) hypertension: Secondary | ICD-10-CM

## 2018-05-14 DIAGNOSIS — N183 Chronic kidney disease, stage 3 unspecified: Secondary | ICD-10-CM

## 2018-05-14 DIAGNOSIS — I4819 Other persistent atrial fibrillation: Secondary | ICD-10-CM

## 2018-06-17 ENCOUNTER — Other Ambulatory Visit (HOSPITAL_COMMUNITY): Payer: Self-pay | Admitting: Cardiology

## 2018-08-17 ENCOUNTER — Telehealth: Payer: Self-pay | Admitting: *Deleted

## 2018-08-17 NOTE — Telephone Encounter (Signed)
Called patient to let them know due to recent [place] and Health Department Protocols, we are not seeing patients in the office. We are instead seeing if they would like to schedule this appointment as a Research scientist (medical) or Laptop. Patient is aware if they decide to reschedule this appointment, they may not be seen or scheduled for the next 4-6 months. Patient at this time declines WebEx Virtual Visits. Patient does not have computer, internet, or smart phone access. Message sent scheduling and nurse.   Patient has no current complaints outside of asthma and COPD SHOB due to pollen and allergy season.  NO swelling, NO CP, NO abnormal SHOB, NO fatigue.  Patient and wife feel he is ok to schedule out 4-6 mths until COVID19 is over. Wife agreed to call the office if there are any issues between now and when he is rescheduled

## 2018-08-28 ENCOUNTER — Ambulatory Visit: Payer: Medicare Other | Admitting: Cardiology

## 2018-09-04 ENCOUNTER — Other Ambulatory Visit: Payer: Self-pay

## 2018-09-05 NOTE — Telephone Encounter (Signed)
Pt agreeable to virtual visit with Dr. Curt Bears tomorrow.     Virtual Visit Pre-Appointment Phone Call  "(Name), I am calling you today to discuss your upcoming appointment. We are currently trying to limit exposure to the virus that causes COVID-19 by seeing patients at home rather than in the office."  1. "What is the BEST phone number to call the day of the visit?" - include this in appointment notes  2. "Do you have or have access to (through a family member/friend) a smartphone with video capability that we can use for your visit?" a. If yes - list this number in appt notes as "cell" (if different from BEST phone #) and list the appointment type as a VIDEO visit in appointment notes b. If no - list the appointment type as a PHONE visit in appointment notes  3. Confirm consent - "In the setting of the current Covid19 crisis, you are scheduled for a (phone or video) visit with your provider on (date) at (time).  Just as we do with many in-office visits, in order for you to participate in this visit, we must obtain consent.  If you'd like, I can send this to your mychart (if signed up) or email for you to review.  Otherwise, I can obtain your verbal consent now.  All virtual visits are billed to your insurance company just like a normal visit would be.  By agreeing to a virtual visit, we'd like you to understand that the technology does not allow for your provider to perform an examination, and thus may limit your provider's ability to fully assess your condition. If your provider identifies any concerns that need to be evaluated in person, we will make arrangements to do so.  Finally, though the technology is pretty good, we cannot assure that it will always work on either your or our end, and in the setting of a video visit, we may have to convert it to a phone-only visit.  In either situation, we cannot ensure that we have a secure connection.  Are you willing to proceed?" STAFF: Did the patient  verbally acknowledge consent to telehealth visit? Document YES/NO here: YES  4. Advise patient to be prepared - "Two hours prior to your appointment, go ahead and check your blood pressure, pulse, oxygen saturation, and your weight (if you have the equipment to check those) and write them all down. When your visit starts, your provider will ask you for this information. If you have an Apple Watch or Kardia device, please plan to have heart rate information ready on the day of your appointment. Please have a pen and paper handy nearby the day of the visit as well."  5. Give patient instructions for MyChart download to smartphone OR Doximity/Doxy.me as below if video visit (depending on what platform provider is using)  6. Inform patient they will receive a phone call 15 minutes prior to their appointment time (may be from unknown caller ID) so they should be prepared to answer    TELEPHONE CALL NOTE  Austin [name] has been deemed a candidate for a follow-up tele-health visit to limit community exposure during the Covid-19 pandemic. I spoke with the patient via phone to ensure availability of phone/video source, confirm preferred email & phone number, and discuss instructions and expectations.  I reminded [name] to be prepared with any vital sign and/or heart rhythm information that could potentially be obtained via home monitoring, at the time of his visit. I  reminded [name] to expect a phone call prior to his visit.  Stanton Kidney, RN 09/05/2018 11:46 AM   INSTRUCTIONS FOR DOWNLOADING THE MYCHART APP TO SMARTPHONE  - The patient must first make sure to have activated MyChart and know their login information - If Apple, go to [employer] and type in MyChart in the search bar and download the app. If Android, ask patient to go to [employer] and type in [place] in the search bar and download the app. The app is free but as with any other app downloads, their phone  may require them to verify saved payment information or Apple/Android password.  - The patient will need to then log into the app with their MyChart username and password, and select [facility] as their healthcare provider to link the account. When it is time for your visit, go to the MyChart app, find appointments, and click Begin Video Visit. Be sure to Select Allow for your device to access the Microphone and Camera for your visit. You will then be connected, and your provider will be with you shortly.  **If they have any issues connecting, or need assistance please contact MyChart service desk [phone])**  **If using a computer, in order to ensure the best quality for their visit they will need to use either of the following Internet Browsers: [employer], or Google Chrome**  IF USING DOXIMITY or DOXY.ME - The patient will receive a link just prior to their visit by text.     FULL LENGTH CONSENT FOR TELE-HEALTH VISIT   I hereby voluntarily request, consent and authorize [facility] and its employed or contracted physicians, physician assistants, nurse practitioners or other licensed health care professionals (the Practitioner), to provide me with telemedicine health care services (the "Services") as deemed necessary by the treating Practitioner. I acknowledge and consent to receive the Services by the Practitioner via telemedicine. I understand that the telemedicine visit will involve communicating with the Practitioner through live audiovisual communication technology and the disclosure of certain medical information by electronic transmission. I acknowledge that I have been given the opportunity to request an in-person assessment or other available alternative prior to the telemedicine visit and am voluntarily participating in the telemedicine visit.  I understand that I have the right to withhold or withdraw my consent to the use of telemedicine in the course of my  care at any time, without affecting my right to future care or treatment, and that the Practitioner or I may terminate the telemedicine visit at any time. I understand that I have the right to inspect all information obtained and/or recorded in the course of the telemedicine visit and may receive copies of available information for a reasonable fee.  I understand that some of the potential risks of receiving the Services via telemedicine include:  Marland Kitchen Delay or interruption in medical evaluation due to technological equipment failure or disruption; . Information transmitted may not be sufficient (e.g. poor resolution of images) to allow for appropriate medical decision making by the Practitioner; and/or  . In rare instances, security protocols could fail, causing a breach of personal health information.  Furthermore, I acknowledge that it is my responsibility to provide information about my medical history, conditions and care that is complete and accurate to the best of my ability. I acknowledge that Practitioner's advice, recommendations, and/or decision may be based on factors not within their control, such as incomplete or inaccurate data provided by me or distortions  of diagnostic images or specimens that may result from electronic transmissions. I understand that the practice of medicine is not an exact science and that Practitioner makes no warranties or guarantees regarding treatment outcomes. I acknowledge that I will receive a copy of this consent concurrently upon execution via email to the email address I last provided but may also request a printed copy by calling the office of [facility].    I understand that my insurance will be billed for this visit.   I have read or had this consent read to me. . I understand the contents of this consent, which adequately explains the benefits and risks of the Services being provided via telemedicine.  . I have been provided ample opportunity to ask  questions regarding this consent and the Services and have had my questions answered to my satisfaction. . I give my informed consent for the services to be provided through the use of telemedicine in my medical care  By participating in this telemedicine visit I agree to the above.

## 2018-09-06 ENCOUNTER — Telehealth (INDEPENDENT_AMBULATORY_CARE_PROVIDER_SITE_OTHER): Payer: Medicare Other | Admitting: Cardiology

## 2018-09-06 ENCOUNTER — Other Ambulatory Visit: Payer: Self-pay

## 2018-09-06 ENCOUNTER — Encounter: Payer: Self-pay | Admitting: Cardiology

## 2018-09-06 DIAGNOSIS — I4819 Other persistent atrial fibrillation: Secondary | ICD-10-CM | POA: Diagnosis not present

## 2018-09-06 NOTE — Progress Notes (Signed)
Electrophysiology TeleHealth Note   Due to national recommendations of social distancing due to COVID 19, an audio/video telehealth visit is felt to be most appropriate for this patient at this time.  See Epic message for the patient's consent to telehealth for [facility].   Date:  09/06/2018   ID:  Austin [name], DOB 1930/07/29, MRN [id number]  Location: patient's home  Provider location: [address], [place]  Evaluation Performed: Follow-up visit  PCP:  Austin Bunting, MD  Cardiologist:  Austin Rocha Meredith Leeds, MD  Electrophysiologist:  Dr Austin [name]  Chief Complaint: Atrial fibrillation  History of Present Illness:    Austin [name] is a 85 y.o. male who presents via audio/video conferencing for a telehealth visit today.  Since last being seen in our clinic, the patient reports doing very well.  Today, he denies symptoms of palpitations, chest pain, shortness of breath,  lower extremity edema, dizziness, presyncope, or syncope.  The patient is otherwise without complaint today.  The patient denies symptoms of fevers, chills, cough, or new SOB worrisome for COVID 19.   Today, denies symptoms of palpitations, chest pain, shortness of breath, orthopnea, PND, lower extremity edema, claudication, dizziness, presyncope, syncope, bleeding, or neurologic sequela. The patient is tolerating medications without difficulties.  He has noted no episodes of atrial fibrillation.  He is continued to check his blood pressure which has remained high.  Otherwise he feels well without complaint.  Past Medical History:  Diagnosis Date  . Allergy   . Asthma   . BPH (benign prostatic hypertrophy)   . CKD (chronic kidney disease) stage 3, GFR 30-59 ml/min (HCC)   . Cough   . Gout   . Hypertensive chronic kidney disease   . Melanoma in situ of right eyelid ([place])   . OSA (obstructive sleep apnea)   . Persistent atrial fibrillation     Past Surgical History:  Procedure  Laterality Date  . CARDIOVERSION [name] 06/05/2015   Procedure: CARDIOVERSION;  Surgeon: Austin Klein, MD;  Location: [facility];  Service: Cardiovascular;  Laterality: [name];  . EYE SURGERY  12/2017    Current Outpatient Medications  Medication Sig Dispense Refill  . acetaminophen (TYLENOL) 500 MG tablet Take 1,000 mg by mouth daily as needed for mild pain or headache.    . albuterol (PROAIR HFA) 108 (90 Base) MCG/ACT inhaler Inhale 2 puffs into the lungs every 4 (four) hours as needed for wheezing or shortness of breath (cough).    . budesonide-formoterol (SYMBICORT) 160-4.5 MCG/ACT inhaler Inhale 2 puffs into the lungs 2 (two) times daily.    . butalbital-acetaminophen-caffeine (FIORICET, ESGIC) 50-325-40 MG tablet Take 1 tablet by mouth every 4 (four) hours as needed (severe headache). Maximum 4 tablets per day  5  . clobetasol cream (TEMOVATE) 8.24 % Apply 1 application topically daily.     Marland Kitchen dofetilide (TIKOSYN) 125 MCG capsule TAKE 1 CAPSULE BY MOUTH TWICE A DAY 180 capsule 1  . EPINEPHrine 0.15 MG/0.15ML IJ injection Inject 0.15 mg into the muscle as needed for anaphylaxis.    . fexofenadine (ALLEGRA ALLERGY) 60 MG tablet Take 60 mg by mouth daily.    . fluticasone (FLONASE) 50 MCG/ACT nasal spray Place 2 sprays into both nostrils at bedtime.   8  . furosemide (LASIX) 20 MG tablet TAKE 1 TABLET BY MOUTH EVERY DAY 90 tablet 3  . gabapentin (NEURONTIN) 300 MG capsule Take 300 mg by mouth at bedtime.    . indomethacin (INDOCIN) 50  MG capsule Take 50 mg by mouth daily as needed (for gout flare up).     . isosorbide mononitrate (IMDUR) 60 MG 24 hr tablet TAKE 1 TABLET BY MOUTH EVERY DAY 90 tablet 3  . KLOR-CON M20 20 MEQ tablet TAKE 1 TABLET (20 MEQ TOTAL) BY MOUTH DAILY. 90 tablet 2  . Lactobacillus Rhamnosus, GG, (CULTURELLE PO) Take 1 capsule by mouth 2 (two) times daily with a meal.    . losartan (COZAAR) 100 MG tablet Take 1 tablet (100 mg total) by mouth daily. 90 tablet 3  . MAGNESIUM  CITRATE PO Take 250 mg by mouth at bedtime.    Austin [name] Glycol-Propyl Glycol (SYSTANE PRESERVATIVE FREE) 0.4-0.3 % SOLN Place 1 drop into both eyes 2 (two) times daily.    . valACYclovir (VALTREX) 1000 MG tablet Take 1,000 mg by mouth daily after supper.     Austin [name] 15 MG TABS tablet TAKE 1 TABLET BY MOUTH EVERY DAY WITH SUPPER 90 tablet 1  . zolpidem (AMBIEN) 10 MG tablet Take 10 mg by mouth at bedtime. for sleep  5  . carvedilol (COREG) 12.5 MG tablet Take 1 tablet (12.5 mg total) by mouth 2 (two) times daily. 180 tablet 3   No current facility-administered medications for this visit.     Allergies:   Bee venom; Diltiazem; Codeine; Ivp dye [iodinated diagnostic agents]; Penicillins; and Tape   Social History:  The patient  reports that he has never smoked. He has never used smokeless tobacco. He reports current alcohol use of about 1.0 standard drinks of alcohol per week. He reports that he does not use drugs.   Family History:  The patient's  family history includes Hypertension in his father; Lung cancer in his father.   ROS:  Please see the history of present illness.   All other systems are personally reviewed and negative.    Exam:    Vital Signs:  BP (!) 153/70   Pulse (!) 53   Well appearing, alert and conversant, regular work of breathing,  good skin color Eyes- anicteric, neuro- grossly intact, skin- no apparent rash or lesions or cyanosis, mouth- oral mucosa is pink   Labs/Other Tests and Data Reviewed:    Recent Labs: No results found for requested labs within last 8760 hours.   Wt Readings from Last 3 Encounters:  03/08/18 167 lb 8 oz (76 kg)  02/27/18 167 lb (75.8 kg)  03/15/17 166 lb 9.6 oz (75.6 kg)     Other studies personally reviewed: Additional studies/ records that were reviewed today include: ECG 02/27/2018 personally reviewed Review of the above records today demonstrates:   Sinus rhythm   ASSESSMENT & PLAN:    1.  Persistent atrial  fibrillation: Currently on dofetilide and Xarelto.  Remains in sinus rhythm.  He Austin [name] need a basic metabolic and magnesium once we are doing routine labs.  No other changes.  This patients CHA2DS2-VASc Score and unadjusted Ischemic Stroke Rate (% per year) is equal to 3.2 % stroke rate/year from a score of 3  Above score calculated as 1 point each if present [CHF, HTN, DM, Vascular=MI/PAD/Aortic Plaque, Age if 65-74, or Male] Above score calculated as 2 points each if present [Age > 75, or Stroke/TIA/TE]  2.  Hypertension: Blood pressure remains elevated.  Khalif Stender increase carvedilol to 25 mg twice a day.   COVID 19 screen The patient denies symptoms of COVID 19 at this time.  The importance of social distancing was discussed today.  Follow-up: 3 months  Current medicines are reviewed at length with the patient today.   The patient does not have concerns regarding his medicines.  The following changes were made today: Increase carvedilol  Labs/ tests ordered today include:  No orders of the defined types were placed in this encounter.    Patient Risk:  after full review of this patients clinical status, I feel that they are at moderate risk at this time.  Today, I have spent 12 minutes with the patient with telehealth technology discussing atrial fibrillation.    Signed, Breylan Lefevers Meredith Leeds, MD  09/06/2018 10:17 AM     [place] [address] [phone] (office) [phone] (fax)

## 2018-09-10 ENCOUNTER — Telehealth: Payer: Self-pay | Admitting: Cardiology

## 2018-09-10 MED ORDER — CARVEDILOL 25 MG PO TABS: 25.0000 mg | ORAL_TABLET | Freq: Two times a day (BID) | ORAL | 3 refills | Status: DC

## 2018-09-10 NOTE — Telephone Encounter (Signed)
Apologized for not sending this Rx. Updated telehealth visit from last week, and Rx ordered/sent. Wife appreciates the help with this.

## 2018-09-10 NOTE — Telephone Encounter (Signed)
Pt's wife calling stating that pt had an video visit with Dr. Curt Bears on 09/06/18 and Dr. Curt Bears increased his Carvedilol to 25 mg tablet BID. This change was not sent in to pt's pharmacy. Please address

## 2018-09-10 NOTE — Addendum Note (Signed)
Addended by: Stanton Kidney on: 09/10/2018 10:37 AM   Modules accepted: Orders

## 2018-10-22 ENCOUNTER — Other Ambulatory Visit: Payer: Self-pay | Admitting: Cardiology

## 2018-10-22 MED ORDER — DOFETILIDE 125 MCG PO CAPS: 125.0000 ug | ORAL_CAPSULE | Freq: Two times a day (BID) | ORAL | 3 refills | Status: DC

## 2018-10-23 ENCOUNTER — Telehealth: Payer: Self-pay | Admitting: Cardiology

## 2018-10-23 NOTE — Telephone Encounter (Signed)
New Message     Pt c/o medication issue:  1. Name of Medication: Dofetilide   2. How are you currently taking this medication (dosage and times per day)? 2 x daily 125 mcg   3. Are you having a reaction (difficulty breathing--STAT)? No   4. What is your medication issue? Pts wife is calling because he is having Dizziness    Please call

## 2018-10-24 NOTE — Telephone Encounter (Signed)
Wife reports pt having some recent dizziness.  Mon/Tu/We he has experienced brief episode/s. BP 157/77, HR avg 50s. Wife thinks pt may have "overdone it" this past weekend.  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  She told him he shouldn't be out there in the heat doing it. She also reports that he got dizzy when he stood up after sitting for prolonged period reading. Informed wife that sounds like pt is just recovering from possibly overdoing it this weekend.  He may have been slightly dehydrated leaving him w/ some residual symptoms.  Also informed that the dizziness upon standing after prolonged period is not abnormal for his age and reviewed ways to try and avoid that in the future.  We discussed monitoring for awhile longer and calling if issue persists.  Informed that Dr. Curt Bears may say to f/u w/ PCP first (if issue persists) and refer to [place] if they think cardiology related. Will forward to [facility] for advisement. Wife understands I will call once advised upon. She is agreeable to plan and to continue monitoring for now, if [place] feels the right thing to do.

## 2018-10-26 ENCOUNTER — Telehealth: Payer: Self-pay | Admitting: Cardiology

## 2018-10-26 NOTE — Telephone Encounter (Signed)
Can use Olmesartan 40 mg instead

## 2018-10-26 NOTE — Telephone Encounter (Signed)
Can use Olmesartan instead.

## 2018-10-26 NOTE — Telephone Encounter (Signed)
[facility] pharmacy on [address] requesting an alternative for Losartan 100  Mg tablet. Pharmacy stating that all losartans are on backorder and was unable to obtain last 3 months. Please address

## 2018-10-26 NOTE — Telephone Encounter (Addendum)
Advised wife Dr Curt Bears recommends to continue to monitor. Wife reports that pt is feeling better and they believe he "over did it". They will call if issues restart.

## 2018-10-29 MED ORDER — OLMESARTAN MEDOXOMIL 40 MG PO TABS: 40.0000 mg | ORAL_TABLET | Freq: Every day | ORAL | 6 refills | Status: DC

## 2018-10-29 NOTE — Telephone Encounter (Signed)
Cathy,  I have updated chart with correct medication. I have removed Losartan from pt med list. Please call pt and instruct of medication change and why.  Please send in Rx once discussed with pt.  I have not sent the Rx in until pt is aware of change and why. Thanks

## 2018-10-29 NOTE — Telephone Encounter (Signed)
Austin [name], is Dr. Curt Bears ordering Olmesartan in place of Losartan? If so can you please order this medication, because it is not on pt's medication list. Thanks

## 2018-10-29 NOTE — Telephone Encounter (Signed)
Called pt and spoke with the pt and his wife concerning the medication change. I informing the pt and his wife per Trinidad Curet, RN, Dr. Macky Lower nurse that Dr. Curt Bears is replacing pt's Lorsartan 100 mg tablet, because pt's pharmacy is unable to get medication, because medication is on backorder and Dr. Curt Bears is replacing Losartan 100 mg tablet with Olmesartan 40 mg tablet daily. Pt and wife stated that pt would finish the Losartan 100 mg tablets that pt has left and start on the new Rx of Olmesartan 40 mg tablet after pt finishes the Losartan 100 mg tablet. I advised the pt and his wife that if they have any other problems, questions or concerns to call the office. They verbalized understanding. Rx sent to pt's pharmacy. Confirmation received. FYI

## 2019-01-13 ENCOUNTER — Other Ambulatory Visit (HOSPITAL_COMMUNITY): Payer: Self-pay | Admitting: Cardiology

## 2019-01-14 NOTE — Telephone Encounter (Signed)
Prescription refill request for Xarelto received.   Last office visit: Camnitz ( 09-06-2018) Weight: 76 kg Age: 85 y.o. Scr: 1.2 (01-22-2018) CrCl: 48 ml/min  Prescription refill sent.

## 2019-02-28 ENCOUNTER — Other Ambulatory Visit: Payer: Self-pay | Admitting: Cardiology

## 2019-02-28 MED ORDER — ISOSORBIDE MONONITRATE ER 60 MG PO TB24: 60.0000 mg | ORAL_TABLET | Freq: Every day | ORAL | 1 refills | Status: DC

## 2019-03-12 ENCOUNTER — Encounter: Payer: Self-pay | Admitting: Neurology

## 2019-03-12 ENCOUNTER — Other Ambulatory Visit: Payer: Self-pay

## 2019-03-12 ENCOUNTER — Ambulatory Visit: Payer: Medicare Other | Admitting: Neurology

## 2019-03-12 VITALS — BP 140/80 | HR 55 | Temp 97.2°F | Ht 68.0 in | Wt 166.0 lb

## 2019-03-12 DIAGNOSIS — N183 Chronic kidney disease, stage 3 unspecified: Secondary | ICD-10-CM

## 2019-03-12 DIAGNOSIS — I4819 Other persistent atrial fibrillation: Secondary | ICD-10-CM | POA: Diagnosis not present

## 2019-03-12 DIAGNOSIS — Z9989 Dependence on other enabling machines and devices: Secondary | ICD-10-CM

## 2019-03-12 DIAGNOSIS — J441 Chronic obstructive pulmonary disease with (acute) exacerbation: Secondary | ICD-10-CM

## 2019-03-12 DIAGNOSIS — G4733 Obstructive sleep apnea (adult) (pediatric): Secondary | ICD-10-CM

## 2019-03-12 DIAGNOSIS — J4530 Mild persistent asthma, uncomplicated: Secondary | ICD-10-CM

## 2019-03-12 DIAGNOSIS — J449 Chronic obstructive pulmonary disease, unspecified: Secondary | ICD-10-CM

## 2019-03-12 DIAGNOSIS — I480 Paroxysmal atrial fibrillation: Secondary | ICD-10-CM

## 2019-03-12 NOTE — Progress Notes (Signed)
PATIENT: Austin [name] DOB: November 17, 1930  REASON FOR VISIT: follow up- fall from a ladder 02-20-2018 - related to impaired depth perception?  Established patient on CPAP.  HISTORY FROM: patient and wife   HISTORY OF PRESENT ILLNESS:  Today 03/12/19: CD - [name] is a 85 year old Caucasian gentleman and uses CPAP compliantly.   His ResMed air view air sense machine has been used 100% of the last 30 days with the last data from 11 March 2019.  Average user x8 hours 50 minutes minimum pressure of 6 maximum pressure 10 and EPR level is 2 cmH2O, the 95th percentile pressure is 9.9 cmH2O there are no significant residual apneas observed the AHI is 0.5/h and the air leak is very low.  The patient is a diagnosis of OSA overlapping with COPD.  He still endorsed the Epworth Sleepiness Scale at 12 out of 24 points and the fatigue severity scale is 30 out of 63 the geriatric depression score at 1 out of 15.  His wife states that he has been a entirely new person since using the new settings.  And he has been much less sleepy and fatigue and daytime, however it would be worthwhile looking for a pulse oximetry on CPAP to make sure that he does not suffer from hypoxic episodes in spite of a controlled sleep apnea.     I have the pleasure of seeing [name] today and meanwhile 85 year old Caucasian gentleman who underwent a corneal transplant on 20 December 2017, and had been doing very well.  He felt well enough actually to trim some hedges and bushes in his garden, used a ladder but missed 1 of the lower stairs when he descended. Fell 12 inches, landed on his elbows and thorax- not head, not neck.   He fell and related to this injury he had to sleep in a recliner for several days and did not use his CPAP.  He had always been a compliant CPAP user and he is back to his usual pattern, his download for the months shows an 87% compliance, average use of time is 6 hours 28 minutes, if  the take out the week of his fall related injury he would reach 7 hours and 27 minutes each night.  He is using an AutoSet between 6 and 10 cmH2O was 2 cm EPR, residual AHI is 2.1/h, this is an excellent resolution and his 95th percentile pressure is 10 cmH2O he does not have major air leaks.  He does not endorse a high degree of fatigue and a fatigue severity score of 31 points, and his Epworth sleepiness score is endorsed at 12 out of 24 points.  He also does not endorse more than one-point out of 15 on his depression scale. I reviewed his medication today he does have some pain pills, hydrocodone- he was nauseated and vomited, but he also developed higher blood pressures- likely due to pain. He could not find relief on tylenol, nor on robaxin.  [place] was [facility].     [name] is an 85 year old male with a history of obstructive sleep apnea on CPAP. He returns today for a compliance download. His download indicates that he uses machine 30 out of 30 days for compliance of 100%. He uses machine greater than 4 hours 29 out of 30 days for compliance of 97. On average he uses his machine 7 hours and 32 minutes.  His residual AHI is 1.3 on a minimum pressure of 6 cm water and max pressure 10 cm water with EPR 2. He does not have a significant leak. He does report that he continues to have some daytime sleepiness however he contributes this to his medications. He returns today for an evaluation.  HISTORY 02/03/16: [name] is here today with an excellent 100% compliance on CPAP and a residual AHI of 1.3 using an AutoSet with a minimum pressure of 6 cm, maximum pressure of 10 cm and 2 cm expiratory pressure relief. He reports that he recently participated in a bus to her and traveled through [facility], every night the company state at a different hotel and traveling with the CPAP was arduous. He has excellent compliance but I informed him that I  would not be insisting on him using CPAP for a 34 or 5 day bus trip considering that the machine can also be damaged. He endorsed today and low-grade of the fatigue severity score of 25 points and the Epworth sleepiness score was endorsed 10 points. He is slightly hoarse , seasonal allergies bother him.   REVIEW OF SYSTEMS: Out of a complete 14 system review of symptoms, the patient complains only of the following symptoms, and all other reviewed systems are negative.  Activity change, eye itching, light sensitivity, wheezing, shortness of breath, environmental allergies, bruise/bleed easily, itching- He does not endorse a high degree of fatigue and a fatigue severity score of 31 points, and his Epworth sleepiness score is endorsed at 12 out of 24 points.   He also does not endorse more than one-point out of 15 on his depression scale. I reviewed his medication today he does have some pain pills, hydrocodone- he was nauseated and vomited, but he also developed higher blood pressures- likely due to pain. He could not find relief on tylenol, nor on robaxin.  He has a dry mouth, hoarse voice.   How likely are you to doze in the following situations: 0 = not likely, 1 = slight chance, 2 = moderate chance, 3 = high chance  Sitting and Reading? 2 Watching Television? 1 Sitting inactive in a public place (theater or meeting)? 2 Lying down in the afternoon when circumstances permit? 3 Sitting and talking to someone?1 Sitting quietly after lunch without alcohol? 2 In a car, while stopped for a few minutes in traffic?0 As a passenger in a car for an hour without a break? 1  Total =  12/ 24 points on 03-12-2019    ALLERGIES: Allergies  Allergen Reactions  . Bee Venom Anaphylaxis  . Diltiazem Other (See Comments)    Severe headaches 120 mg BID  . Codeine Nausea And Vomiting  . Ivp Dye [Iodinated Diagnostic Agents] Rash  . Penicillins Swelling and Rash    Has patient had a PCN reaction causing  immediate rash, facial/tongue/throat swelling, SOB or lightheadedness with hypotension: Yes Has patient had a PCN reaction causing severe rash involving mucus membranes or skin necrosis: No Has patient had a PCN reaction that required hospitalization No Has patient had a PCN reaction occurring within the last 10 years: No If all of the above answers are "NO", then may proceed with Cephalosporin use.  . Tape Itching and Rash    Redness.  Please use "paper" tape only    HOME MEDICATIONS: Outpatient Medications Prior to Visit  Medication Sig Dispense Refill  . acetaminophen (TYLENOL) 500 MG tablet Take 1,000 mg by mouth daily as needed for  mild pain or headache.    . albuterol (PROAIR HFA) 108 (90 Base) MCG/ACT inhaler Inhale 2 puffs into the lungs every 4 (four) hours as needed for wheezing or shortness of breath (cough).    . budesonide-formoterol (SYMBICORT) 160-4.5 MCG/ACT inhaler Inhale 2 puffs into the lungs 2 (two) times daily.    . butalbital-acetaminophen-caffeine (FIORICET, ESGIC) 50-325-40 MG tablet Take 1 tablet by mouth every 4 (four) hours as needed (severe headache). Maximum 4 tablets per day  5  . clobetasol cream (TEMOVATE) [id number] % Apply 1 application topically daily.     Marland Kitchen dofetilide (TIKOSYN) 125 MCG capsule Take 1 capsule (125 mcg total) by mouth 2 (two) times daily. 180 capsule 3  . EPINEPHrine 0.15 MG/0.15ML IJ injection Inject 0.15 mg into the muscle as needed for anaphylaxis.    . fexofenadine (ALLEGRA ALLERGY) 60 MG tablet Take 60 mg by mouth daily.    . fluticasone (FLONASE) 50 MCG/ACT nasal spray Place 2 sprays into both nostrils at bedtime.   8  . furosemide (LASIX) 20 MG tablet TAKE 1 TABLET BY MOUTH EVERY DAY 90 tablet 3  . gabapentin (NEURONTIN) 300 MG capsule Take 300 mg by mouth at bedtime.    . indomethacin (INDOCIN) 50 MG capsule Take 50 mg by mouth daily as needed (for gout flare up).     . isosorbide mononitrate (IMDUR) 60 MG 24 hr tablet Take 1 tablet (60 mg  total) by mouth daily. 90 tablet 1  . KLOR-CON M20 20 MEQ tablet TAKE 1 TABLET (20 MEQ TOTAL) BY MOUTH DAILY. 90 tablet 2  . Lactobacillus Rhamnosus, GG, (CULTURELLE PO) Take 1 capsule by mouth 2 (two) times daily with a meal.    . MAGNESIUM CITRATE PO Take 250 mg by mouth at bedtime.    Marland Kitchen olmesartan (BENICAR) 40 MG tablet Take 1 tablet (40 mg total) by mouth daily. 30 tablet 6  . Polyethyl Glycol-Propyl Glycol (SYSTANE PRESERVATIVE FREE) 0.4-0.3 % SOLN Place 1 drop into both eyes 2 (two) times daily.    . prednisoLONE acetate (PRED FORTE) 1 % ophthalmic suspension Place 1 drop into the right eye 4 times daily.    . valACYclovir (VALTREX) 1000 MG tablet Take 1,000 mg by mouth daily after supper.     [name] 15 MG TABS tablet TAKE 1 TABLET BY MOUTH EVERY DAY WITH SUPPER 90 tablet 1  . zolpidem (AMBIEN) 10 MG tablet Take 10 mg by mouth at bedtime. for sleep  5  . carvedilol (COREG) 25 MG tablet Take 1 tablet (25 mg total) by mouth 2 (two) times daily. 180 tablet 3   No facility-administered medications prior to visit.     PAST MEDICAL HISTORY: Past Medical History:  Diagnosis Date  . Allergy   . Asthma   . BPH (benign prostatic hypertrophy)   . CKD (chronic kidney disease) stage 3, GFR 30-59 ml/min   . Cough   . Gout   . Hypertensive chronic kidney disease   . Melanoma in situ of right eyelid ([place])   . OSA (obstructive sleep apnea)   . Persistent atrial fibrillation ([place])     PAST SURGICAL HISTORY: Past Surgical History:  Procedure Laterality Date  . CARDIOVERSION [name] 06/05/2015   Procedure: CARDIOVERSION;  Surgeon: Sanda Klein, MD;  Location: [facility];  Service: Cardiovascular;  Laterality: [name];  . EYE SURGERY  12/2017    FAMILY HISTORY: Family History  Problem Relation Age of Onset  . Hypertension Father   . Lung cancer  Father     SOCIAL HISTORY: Social History   Socioeconomic History  . Marital status: Married    Spouse name: Austin [name]  . Number of children: Not  on file  . Years of education: Not on file  . Highest education level: Not on file  Occupational History  . Not on file  Social Needs  . Financial resource strain: Not on file  . Food insecurity    Worry: Not on file    Inability: Not on file  . Transportation needs    Medical: Not on file    Non-medical: Not on file  Tobacco Use  . Smoking status: Never Smoker  . Smokeless tobacco: Never Used  Substance and Sexual Activity  . Alcohol use: Yes    Alcohol/week: 1.0 standard drinks    Types: 1 Standard drinks or equivalent per week    Comment: occ  . Drug use: No  . Sexual activity: Not on file  Lifestyle  . Physical activity    Days per week: Not on file    Minutes per session: Not on file  . Stress: Not on file  Relationships  . Social Herbalist on phone: Not on file    Gets together: Not on file    Attends religious service: Not on file    Active member of club or organization: Not on file    Attends meetings of clubs or organizations: Not on file    Relationship status: Not on file  . Intimate partner violence    Fear of current or ex partner: Not on file    Emotionally abused: Not on file    Physically abused: Not on file    Forced sexual activity: Not on file  Other Topics Concern  . Not on file  Social History Narrative   Right Handed.  Caffiene  Coffee, tea 3 cups daily.   Pt retired.     Lives at home with wife.        PHYSICAL EXAM  Vitals:   03/12/19 0911  BP: 140/80  Pulse: (!) 55  Temp: (!) 97.2 F (36.2 C)  Weight: 166 lb (75.3 kg)  Height: 5\' 8"  (1.727 m)   Body mass index is 25.24 kg/m.  Generalized: Well developed, in no acute distress  Cardiac rate 86, irregular-    Neurological examination  Mentation: Alert. Cranial nerve : no change in taste or smell - but he is hoarse.  Pupils right pupil abnormal- anterior chamber of the right eye collapsed ( 12-2017) , he had 2 times melanoma in the right eye. Cornea transplant was  done 12-2017 . Extraocular movements were full, visual field of the left was full on confrontational test. He has avoided enucleation. His right eye is hyperpigmented. He is able to see some with the right eye now.  Facial sensation and strength were normal. Uvula invisible and  tongue appears deviated to the right. Head turning and shoulder shrug  were symmetric and in normal  ROM.  Motor:  Equal tone and mass in both upper extremities. Good symmetric grip strength. Sensory: Sensory testing is intact to soft touch on all 4 extremities. No evidence of extinction is noted.  Coordination: deferred.  Gait and station: Gait is normal based, can rise without bracing himself.   DIAGNOSTIC DATA (LABS, IMAGING, TESTING) - I reviewed patient records, labs, notes, testing and imaging myself where available.    ASSESSMENT AND PLAN 85 y.o. year old male  has a  past medical history of Allergy, Asthma, BPH (benign prostatic hypertrophy), CKD (chronic kidney disease) stage 3, GFR 30-59 ml/min, Cough, Gout, Hypertensive chronic kidney disease, Melanoma in situ of right eyelid ([place]), OSA (obstructive sleep apnea), and Persistent atrial fibrillation ([place]). here with :  1. OSA/ COPD on CPAP- very compliant . Great resolution of apnea to residual AHI of 0.5/h.  He reports a dry mouth, needs help to adjust the humidifier of his CPAP. He is needing help.  Asthmatic and exposure to second hand smoke, he was never a smoker.   2. Melanoma in right eye, anterior chamber collapse, irrgeluar iris and pupil shape. Had a corneal transplant. He wears glasses , the right lens is blackened.   3. Peak BP at night, witnessed in hospital and may be related to OSA- but his download shows good control. I suspect pain to be cause.  He has been taking new and higher doses of HTN medication.   4. nocturnal skin itching.  He has a dry skin, dry mouth, dry eyes. No longer taking sleep aids since ithcing medication is used. The itching let  to prolonged sleep latency.   His CPAP download shows excellent compliance with the treatment of his apnea. He is encouraged to continue using his CPAP daily. He will follow-up in 12 months- unless we find hypoxemia.  His wife states that he has been a entirely new person since using the new settings.  And he has been much less sleepy and fatigue and daytime, however it would be worthwhile looking for a pulse oximetry on CPAP to make sure that he does not suffer from hypoxic episodes in spite of a controlled sleep apnea.   I spent 25 minutes with the patient. 50% of this time was spent reviewing CPAP download and records from [place], [place].  And compare them with our sleep labs results and new CPAP download. His machine was issued in 2017 and is not to be replaced until 2022.   Larey Seat, MD   03/12/2019, 9:40 AM [facility] [address], [place], [place] [address] [phone]

## 2019-03-12 NOTE — Patient Instructions (Signed)

## 2019-03-14 ENCOUNTER — Emergency Department (HOSPITAL_COMMUNITY): Payer: Medicare Other

## 2019-03-14 ENCOUNTER — Emergency Department (HOSPITAL_COMMUNITY)
Admission: EM | Admit: 2019-03-14 | Discharge: 2019-03-14 | Disposition: A | Discharge status: Home / Self Care | Payer: Medicare Other | Attending: Emergency Medicine | Admitting: Emergency Medicine

## 2019-03-14 ENCOUNTER — Other Ambulatory Visit: Payer: Self-pay

## 2019-03-14 DIAGNOSIS — J45909 Unspecified asthma, uncomplicated: Secondary | ICD-10-CM | POA: Diagnosis not present

## 2019-03-14 DIAGNOSIS — N183 Chronic kidney disease, stage 3 unspecified: Secondary | ICD-10-CM | POA: Diagnosis not present

## 2019-03-14 DIAGNOSIS — R1012 Left upper quadrant pain: Secondary | ICD-10-CM | POA: Insufficient documentation

## 2019-03-14 DIAGNOSIS — I4819 Other persistent atrial fibrillation: Secondary | ICD-10-CM | POA: Diagnosis not present

## 2019-03-14 DIAGNOSIS — R079 Chest pain, unspecified: Secondary | ICD-10-CM

## 2019-03-14 DIAGNOSIS — I129 Hypertensive chronic kidney disease with stage 1 through stage 4 chronic kidney disease, or unspecified chronic kidney disease: Secondary | ICD-10-CM | POA: Diagnosis not present

## 2019-03-14 DIAGNOSIS — I10 Essential (primary) hypertension: Secondary | ICD-10-CM

## 2019-03-14 DIAGNOSIS — N281 Cyst of kidney, acquired: Secondary | ICD-10-CM | POA: Diagnosis not present

## 2019-03-14 DIAGNOSIS — Z20828 Contact with and (suspected) exposure to other viral communicable diseases: Secondary | ICD-10-CM | POA: Insufficient documentation

## 2019-03-14 LAB — CBC WITH DIFFERENTIAL/PLATELET
Abs Immature Granulocytes: 0.12 10*3/uL — ABNORMAL HIGH (ref 0.00–0.07)
Basophils Absolute: 0.1 10*3/uL (ref 0.0–0.1)
Basophils Relative: 1 %
Eosinophils Absolute: 0.4 10*3/uL (ref 0.0–0.5)
Eosinophils Relative: 4 %
HCT: 40.4 % (ref 39.0–52.0)
Hemoglobin: 12.8 g/dL — ABNORMAL LOW (ref 13.0–17.0)
Immature Granulocytes: 1 %
Lymphocytes Relative: 30 %
Lymphs Abs: 2.6 10*3/uL (ref 0.7–4.0)
MCH: 29.7 pg (ref 26.0–34.0)
MCHC: 31.7 g/dL (ref 30.0–36.0)
MCV: 93.7 fL (ref 80.0–100.0)
Monocytes Absolute: 1.2 10*3/uL — ABNORMAL HIGH (ref 0.1–1.0)
Monocytes Relative: 14 %
Neutro Abs: 4.4 10*3/uL (ref 1.7–7.7)
Neutrophils Relative %: 50 %
Platelets: 416 10*3/uL — ABNORMAL HIGH (ref 150–400)
RBC: 4.31 MIL/uL (ref 4.22–5.81)
RDW: 14.2 % (ref 11.5–15.5)
WBC: 8.7 10*3/uL (ref 4.0–10.5)
nRBC: 0 % (ref 0.0–0.2)

## 2019-03-14 LAB — COMPREHENSIVE METABOLIC PANEL
ALT: 12 U/L (ref 0–44)
AST: 14 U/L — ABNORMAL LOW (ref 15–41)
Albumin: 3 g/dL — ABNORMAL LOW (ref 3.5–5.0)
Alkaline Phosphatase: 63 U/L (ref 38–126)
Anion gap: 10 (ref 5–15)
BUN: 15 mg/dL (ref 8–23)
CO2: 26 mmol/L (ref 22–32)
Calcium: 8.8 mg/dL — ABNORMAL LOW (ref 8.9–10.3)
Chloride: 107 mmol/L (ref 98–111)
Creatinine, Ser: 1.5 mg/dL — ABNORMAL HIGH (ref 0.61–1.24)
GFR calc Af Amer: 48 mL/min — ABNORMAL LOW (ref >60)
GFR calc non Af Amer: 42 mL/min — ABNORMAL LOW (ref >60)
Glucose, Bld: 111 mg/dL — ABNORMAL HIGH (ref 70–99)
Potassium: 4 mmol/L (ref 3.5–5.1)
Sodium: 143 mmol/L (ref 135–145)
Total Bilirubin: 0.4 mg/dL (ref 0.3–1.2)
Total Protein: 5.7 g/dL — ABNORMAL LOW (ref 6.5–8.1)

## 2019-03-14 LAB — TROPONIN I (HIGH SENSITIVITY)
Troponin I (High Sensitivity): 10 ng/L (ref <18)
Troponin I (High Sensitivity): 7 ng/L (ref <18)

## 2019-03-14 LAB — BRAIN NATRIURETIC PEPTIDE: B Natriuretic Peptide: 200.9 pg/mL — ABNORMAL HIGH (ref 0.0–100.0)

## 2019-03-14 LAB — LIPASE, BLOOD: Lipase: 35 U/L (ref 11–51)

## 2019-03-14 LAB — SARS CORONAVIRUS 2 (TAT 6-24 HRS): SARS Coronavirus 2: NEGATIVE

## 2019-03-14 MED ORDER — FUROSEMIDE 10 MG/ML IJ SOLN
40.0000 mg | Freq: Once | INTRAMUSCULAR | Status: AC
  Administered 2019-03-14: 40 mg via INTRAVENOUS
  Filled 2019-03-14: qty 4

## 2019-03-14 NOTE — ED Provider Notes (Signed)
Emergency Department Provider Note   I have reviewed the triage vital signs and the nursing notes.   HISTORY  Chief Complaint Abdominal Pain   HPI Austin [name] is a 85 y.o. male who presents to the emergency department today with left sided upper abdomen lower chest pain.  Patient states that he was in his usual state of health lying in bed around 2200 at a acute onset of a sharp pain in his left upper abdomen.  Was not associated shortness of breath, diaphoresis, nausea, vomiting, lightheadedness or other symptoms.  The patient states that his wife is a Marine scientist and she checked his blood pressure and it was above [id number] systolic so he took a couple Tylenol and called EMS.  EMS arrival given for 81 mg aspirin but his pain had already resolved.  He states he thought it was likely indigestion however he has no history of indigestion the.  This is a new symptom for him.  He had not eaten in the last 4 hours prior to it starting.  Did not feel palpitations, chest pain, radiation, arm pain, neck pain or other associated symptoms.   No other associated or modifying symptoms.    Past Medical History:  Diagnosis Date  . Allergy   . Asthma   . BPH (benign prostatic hypertrophy)   . CKD (chronic kidney disease) stage 3, GFR 30-59 ml/min   . Cough   . Gout   . Hypertensive chronic kidney disease   . Melanoma in situ of right eyelid ([place])   . OSA (obstructive sleep apnea)   . Persistent atrial fibrillation [facility])     Patient Active Problem List   Diagnosis Date Noted  . Atrial fibrillation and flutter ([place]) 03/08/2018  . OSA on CPAP 03/08/2018  . Atrial fibrillation, persistent ([place]) 08/10/2015  . Atrial fibrillation ([place]) 07/29/2015  . CPAP use counseling 07/29/2015  . OSA and COPD overlap syndrome ([place]) 07/29/2015  . Essential hypertension 06/22/2015  . CKD (chronic kidney disease) 06/22/2015  . Persistent atrial fibrillation ([place])   . Gasping for breath 03/19/2015  . Snoring  03/19/2015  . Asthma, mild persistent 03/19/2015  . COPD exacerbation ([place]) 03/19/2015  . FUO (fever of unknown origin) 03/19/2013  . Melanoma in situ of right eyelid ([place])   . Neurotrophic keratoconjunctivitis 06/01/2011  . Malignant neoplasm of conjunctiva ([place]) 03/02/2011  . Trichiasis of eyelid 03/02/2011    Past Surgical History:  Procedure Laterality Date  . CARDIOVERSION [name] 06/05/2015   Procedure: CARDIOVERSION;  Surgeon: Sanda Klein, MD;  Location: [facility];  Service: Cardiovascular;  Laterality: [name];  . EYE SURGERY  12/2017    Current Outpatient Rx  . Order #: [id number] Class: Historical Med  . Order #: [id number] Class: Historical Med  . Order #: [id number] Class: Historical Med  . Order #: [id number] Class: Historical Med  . Order #: [id number] Class: Normal  . Order #: [id number] Class: Historical Med  . Order #: [id number] Class: Normal  . Order #: [id number] Class: Historical Med  . Order #: [id number] Class: Historical Med  . Order #: [id number] Class: Historical Med  . Order #: [id number] Class: Normal  . Order #: [id number] Class: Historical Med  . Order #: [id number] Class: Historical Med  . Order #: [id number] Class: Normal  . Order #: [id number] Class: Normal  . Order #: [id number] Class: Historical Med  . Order #: [id number] Class: Historical Med  . Order #: [id number] Class: Normal  . Order #: [id number] Class: Historical Med  . Order #: [id number] Class: Historical Med  . Order #: [id number] Class: Historical Med  .  Order #: [id number] Class: Normal  . Order #: [id number] Class: Historical Med    Allergies Bee venom, Diltiazem, Codeine, Ivp dye [iodinated diagnostic agents], Penicillins, and Tape  Family History  Problem Relation Age of Onset  . Hypertension Father   . Lung cancer Father     Social History Social History   Tobacco Use  . Smoking status: Never Smoker  . Smokeless tobacco: Never Used  Substance Use Topics  . Alcohol use: Yes    Alcohol/week: 1.0 standard  drinks    Types: 1 Standard drinks or equivalent per week    Comment: occ  . Drug use: No    Review of Systems  All other systems negative except as documented in the HPI. All pertinent positives and negatives as reviewed in the HPI. ____________________________________________   PHYSICAL EXAM:  VITAL SIGNS: ED Triage Vitals  Enc Vitals Group     BP 03/14/19 0111 (!) 217/100     Pulse Rate 03/14/19 0111 60     Resp 03/14/19 0111 19     Temp 03/14/19 0111 98 F (36.7 C)     Temp Source 03/14/19 0111 Oral     SpO2 03/14/19 0100 95 %    Constitutional: Alert and oriented. Well appearing and in no acute distress. Eyes: blind right eye Head: Atraumatic. Nose: No congestion/rhinnorhea. Mouth/Throat: Mucous membranes are moist.  Oropharynx non-erythematous. Neck: No stridor.  No meningeal signs.   Cardiovascular: Normal rate, regular rhythm. Good peripheral circulation. Grossly normal heart sounds.   Respiratory: Normal respiratory effort.  No retractions. Lungs CTAB. Gastrointestinal: Soft and nontender. Mild abdominal distention.  Musculoskeletal: No lower extremity tenderness nor edema. No gross deformities of extremities. Neurologic:  Normal speech and language. No gross focal neurologic deficits are appreciated.  Skin:  Skin is warm, dry and intact. No rash noted.  ____________________________________________   LABS (all labs ordered are listed, but only abnormal results are displayed)  Labs Reviewed  CBC WITH DIFFERENTIAL/PLATELET  COMPREHENSIVE METABOLIC PANEL  LIPASE, BLOOD  TROPONIN I (HIGH SENSITIVITY)   ____________________________________________  EKG   EKG Interpretation  Date/Time:  Thursday March 14 2019 01:09:34 EDT Ventricular Rate:  61 PR Interval:    QRS Duration: 106 QT Interval:  476 QTC Calculation: 480 R Axis:   -55 Text Interpretation: Sinus rhythm Left anterior fascicular block Abnormal R-wave progression, early transition  Anteroseptal infarct, old Borderline repolarization abnormality No significant change since last tracing Confirmed by Merrily Pew [phone] on 03/14/2019 1:29:09 AM       ____________________________________________  RADIOLOGY  No results found.  ____________________________________________   PROCEDURES  Procedure(s) performed:   Procedures   ____________________________________________   INITIAL IMPRESSION / ASSESSMENT AND PLAN / ED COURSE  Patient is still hypertensive here even though is been compliant with his medications.  Reviewed the records it does not appear that he is usually much higher than [id number] systolic.  Will check a couple readings if it remains high or his pain returns will play give some home doses of medications.  At this time I not clear if this is abdominal or chest pain that he had.  Has no history of indigestion and this started 4 hours after he had eaten although he was lying down he could still be a possibility.  His EKG looks very similar to previous ones without any obvious ischemia or ischemic changes.  Will check a troponin, lipase basic labs.  Reevaluate for disposition.  Patient's work-up overall reassuring initially.  We will plan for second troponin  as far as an ACS rule out.  His CT scan shows bilateral airspace opacities however patient is not febrile, coughing, short of breath and is not particularly hypoxic.  Patient has a history of dyspnea and that has not changed recently.  We will send a Covid to ensure he is able to follow the proper precautions of positive however I think this is unlikely related to his symptoms here today.  Could also be mild pulmonary edema he does have a history so we will add on a BNP but does not seem to be overtly fluid overloaded or in acute pulmonary edema. Patient's excrement was negative.  His BNP did end up being elevated.  I think the CT findings are more likely related interstitial edema rather than an infectious  cause.  However I did have a long talk with him and his wife and gave very strict return precautions for any new symptoms such as fever, cough worsening shortness of breath or return of chest pain.   Pertinent labs & imaging results that were available during my care of the patient were reviewed by me and considered in my medical decision making (see chart for details).  A medical screening exam was performed and I feel the patient has had an appropriate workup for their chief complaint at this time and likelihood of emergent condition existing is low. They have been counseled on decision, discharge, follow up and which symptoms necessitate immediate return to the emergency department. They or their family verbally stated understanding and agreement with plan and discharged in stable condition.   ____________________________________________  FINAL CLINICAL IMPRESSION(S) / ED DIAGNOSES  Final diagnoses:  None     MEDICATIONS GIVEN DURING THIS VISIT:  Medications - No data to display   NEW OUTPATIENT MEDICATIONS STARTED DURING THIS VISIT:  New Prescriptions   No medications on file    Note:  This note was prepared with assistance of Dragon voice recognition software. Occasional wrong-word or sound-a-like substitutions may have occurred due to the inherent limitations of voice recognition software.   Royale Lennartz, Corene Cornea, MD 03/14/19 [phone]

## 2019-03-14 NOTE — ED Triage Notes (Signed)
Pt presents to ED from home BIB [facility]. Pt c/o L epigastric pain @ 2230 that lasted around 28m. Pt reports that it was sharp. No pain with EMS in epigastric, but now in LUQ and LLQ. Pt reports diarrhea a few days ago.   During triage pt states its chest pain not epigastric.

## 2019-03-14 NOTE — ED Notes (Signed)
Patient transported to CT scan . 

## 2019-03-15 ENCOUNTER — Telehealth (HOSPITAL_COMMUNITY): Payer: Self-pay

## 2019-04-05 ENCOUNTER — Telehealth: Payer: Self-pay | Admitting: Cardiology

## 2019-04-05 ENCOUNTER — Encounter: Payer: Self-pay | Admitting: Neurology

## 2019-04-05 NOTE — Telephone Encounter (Signed)
New Message:  Wife said she needs to come in with pt for his appointment. She says he can not understand and interpret what is said to him. She said she is a retired [employer] scientist and she needs to help him please.

## 2019-04-05 NOTE — Telephone Encounter (Signed)
Wife aware ok to accompany pt to his appt next week. She appreciates our understanding.

## 2019-04-09 ENCOUNTER — Telehealth: Payer: Self-pay

## 2019-04-09 ENCOUNTER — Encounter: Payer: Self-pay | Admitting: Cardiology

## 2019-04-09 ENCOUNTER — Other Ambulatory Visit: Payer: Self-pay

## 2019-04-09 ENCOUNTER — Ambulatory Visit: Payer: Medicare Other | Admitting: Cardiology

## 2019-04-09 ENCOUNTER — Encounter: Payer: Self-pay | Admitting: Neurology

## 2019-04-09 VITALS — BP 148/78 | HR 58 | Ht 68.0 in | Wt 168.6 lb

## 2019-04-09 DIAGNOSIS — I4819 Other persistent atrial fibrillation: Secondary | ICD-10-CM

## 2019-04-09 DIAGNOSIS — G4733 Obstructive sleep apnea (adult) (pediatric): Secondary | ICD-10-CM

## 2019-04-09 DIAGNOSIS — I480 Paroxysmal atrial fibrillation: Secondary | ICD-10-CM

## 2019-04-09 DIAGNOSIS — N183 Chronic kidney disease, stage 3 unspecified: Secondary | ICD-10-CM

## 2019-04-09 DIAGNOSIS — J441 Chronic obstructive pulmonary disease with (acute) exacerbation: Secondary | ICD-10-CM

## 2019-04-09 NOTE — Patient Instructions (Signed)
Medication Instructions:  Your physician recommends that you continue on your current medications as directed. Please refer to the Current Medication list given to you today.  * If you need a refill on your cardiac medications before your next appointment, please call your pharmacy.   Labwork: None ordered  Testing/Procedures: None ordered  Follow-Up: At [facility], you and your health needs are our priority.  As part of our continuing mission to provide you with exceptional heart care, we have created designated Provider Care Teams.  These Care Teams include your primary Cardiologist (physician) and Advanced Practice Providers (APPs -  Physician Assistants and Nurse Practitioners) who all work together to provide you with the care you need, when you need it.  You will need a follow up appointment in 6 months.  Please call our office 2 months in advance to schedule this appointment.  You may see Dr Curt Bears or one of the following Advanced Practice Providers on your designated Care Team:    Chanetta Marshall, NP  Tommye Standard, PA-C  [name], [place]  [facility]   Thank you for choosing [facility]!!   Trinidad Curet, RN [phone]  Any Other Special Instructions Will Be Listed Below (If Applicable).

## 2019-04-09 NOTE — Telephone Encounter (Signed)
Received ONO on auto-pap results from 04/05/2019. This is a Dr. Brett Fairy pt. Will send results to Dr. Rexene Alberts to review in Dr. Edwena Felty absence.

## 2019-04-09 NOTE — Telephone Encounter (Signed)
I reviewed the patient's ONO results from 04/05/2019, total test duration was only 4 hours and 39 minutes, test was done on room air while on AutoPap of 6 to 10 cm.  Please advise patient that he does not qualify for supplemental oxygen and that his lowest oxygen saturation was 90%, baseline was 93%.  However, it appears that he had loss of sensor for 2-1/2 or so hours in the middle of the night.  This may underestimate his desaturations.  For now, he does not qualify for supplemental oxygen based on available data.  I will copy Dr. Brett Fairy and she may consider repeating the test.

## 2019-04-09 NOTE — Progress Notes (Signed)
Electrophysiology Office Note   Date:  04/09/2019   ID:  EILAM SPIEGEL, DOB 02/15/1931, MRN [id number]  PCP:  Burnard Bunting, MD  Primary Electrophysiologist:  Will Meredith Leeds, MD    No chief complaint on file.    History of Present Illness: Austin [name] is a 85 y.o. male who presents today for electrophysiology evaluation.   He has a h/o melanoma of rt eyelid with subsequent loss of vision in rt eye, COPD, CKD, recently diagnosed sleep apnea.  He was diagnosed with atrial fibrillation in 03/2016.  As admitted to the hospital for Tikosyn administration. Converted to sinus rhythm on Tikosyn.   Today, denies symptoms of palpitations, chest pain, shortness of breath, orthopnea, PND, lower extremity edema, claudication, dizziness, presyncope, syncope, bleeding, or neurologic sequela. The patient is tolerating medications without difficulties.  He is overall feeling well today.  He did have an episode in October where he had chest pain and was found to have a blood pressure close to 200.  He presented to the emergency room and had a negative work-up.  Since that time he has done well without major complaints.  Past Medical History:  Diagnosis Date  . Allergy   . Asthma   . BPH (benign prostatic hypertrophy)   . CKD (chronic kidney disease) stage 3, GFR 30-59 ml/min   . Cough   . Gout   . Hypertensive chronic kidney disease   . Melanoma in situ of right eyelid ([place])   . OSA (obstructive sleep apnea)   . Persistent atrial fibrillation [facility])    Past Surgical History:  Procedure Laterality Date  . CARDIOVERSION [name] 06/05/2015   Procedure: CARDIOVERSION;  Surgeon: Sanda Klein, MD;  Location: [facility] ENDOSCOPY;  Service: Cardiovascular;  Laterality: [name];  . EYE SURGERY  12/2017     Current Outpatient Medications  Medication Sig Dispense Refill  . acetaminophen (TYLENOL) 500 MG tablet Take 1,000 mg by mouth daily as needed for mild pain or headache.    . albuterol (PROAIR  HFA) 108 (90 Base) MCG/ACT inhaler Inhale 2 puffs into the lungs every 4 (four) hours as needed for wheezing or shortness of breath (cough).    . budesonide-formoterol (SYMBICORT) 160-4.5 MCG/ACT inhaler Inhale 2 puffs into the lungs 2 (two) times daily.    . butalbital-acetaminophen-caffeine (FIORICET, ESGIC) 50-325-40 MG tablet Take 1 tablet by mouth every 4 (four) hours as needed (severe headache). Maximum 4 tablets per day  5  . carvedilol (COREG) 25 MG tablet Take 1 tablet (25 mg total) by mouth 2 (two) times daily. 180 tablet 3  . clobetasol cream (TEMOVATE) [id number] % Apply 1 application topically daily as needed (for itching).     . dofetilide (TIKOSYN) 125 MCG capsule Take 1 capsule (125 mcg total) by mouth 2 (two) times daily. 180 capsule 3  . EPINEPHrine 0.15 MG/0.15ML IJ injection Inject 0.15 mg into the muscle as needed for anaphylaxis.    Marland Kitchen erythromycin ophthalmic ointment Place 1 application into the right eye 4 (four) times daily.    . fexofenadine (ALLEGRA ALLERGY) 60 MG tablet Take 60 mg by mouth every evening.     . fluticasone (FLONASE) 50 MCG/ACT nasal spray Place 2 sprays into both nostrils at bedtime.   8  . furosemide (LASIX) 20 MG tablet TAKE 1 TABLET BY MOUTH EVERY DAY 90 tablet 3  . gabapentin (NEURONTIN) 300 MG capsule Take 300 mg by mouth at bedtime.    . hydrOXYzine (ATARAX/VISTARIL) 25 MG tablet Take  37.5 mg by mouth at bedtime.    . indomethacin (INDOCIN) 50 MG capsule Take 50 mg by mouth daily as needed (for gout flare up).     . isosorbide mononitrate (IMDUR) 60 MG 24 hr tablet Take 1 tablet (60 mg total) by mouth daily. 90 tablet 1  . KLOR-CON M20 20 MEQ tablet TAKE 1 TABLET (20 MEQ TOTAL) BY MOUTH DAILY. 90 tablet 2  . MAGNESIUM CITRATE PO Take 250 mg by mouth at bedtime.    . moxifloxacin (VIGAMOX) 0.5 % ophthalmic solution Place 1 drop into the right eye 4 (four) times daily.    Marland Kitchen olmesartan (BENICAR) 40 MG tablet Take 1 tablet (40 mg total) by mouth daily. 30  tablet 6  . Polyethyl Glycol-Propyl Glycol (SYSTANE PRESERVATIVE FREE) 0.4-0.3 % SOLN Place 1 drop into both eyes 2 (two) times daily.    . prednisoLONE acetate (PRED FORTE) 1 % ophthalmic suspension Place 1 drop into the right eye daily.     [name] 15 MG TABS tablet TAKE 1 TABLET BY MOUTH EVERY DAY WITH SUPPER 90 tablet 1  . zolpidem (AMBIEN) 10 MG tablet Take 10 mg by mouth at bedtime as needed for sleep.   5   No current facility-administered medications for this visit.     Allergies:   Bee venom, Diltiazem, Codeine, Ivp dye [iodinated diagnostic agents], Penicillins, and Tape   Social History:  The patient  reports that he has never smoked. He has never used smokeless tobacco. He reports current alcohol use of about 1.0 standard drinks of alcohol per week. He reports that he does not use drugs.   Family History:  The patient's family history includes Hypertension in his father and mother; Lung cancer in his father.   ROS:  Please see the history of present illness.   Otherwise, review of systems is positive for none.   All other systems are reviewed and negative.   PHYSICAL EXAM: VS:  BP (!) 148/78   Pulse (!) 58   Ht 5\' 8"  (1.727 m)   Wt 168 lb 9.6 oz (76.5 kg)   SpO2 96%   BMI 25.64 kg/m  , BMI Body mass index is 25.64 kg/m. GEN: Well nourished, well developed, in no acute distress  HEENT: normal  Neck: no JVD, carotid bruits, or masses Cardiac: RRR; no murmurs, rubs, or gallops,no edema  Respiratory:  clear to auscultation bilaterally, normal work of breathing GI: soft, nontender, nondistended, + BS MS: no deformity or atrophy  Skin: warm and dry Neuro:  Strength and sensation are intact Psych: euthymic mood, full affect  EKG:  EKG is not ordered today. Personal review of the ekg ordered 03/14/19 shows this rhythm, left anterior fascicular block, rate 61, QTc 480 ms  Recent Labs: 03/14/2019: ALT 12; B Natriuretic Peptide 200.9; BUN 15; Creatinine, Ser 1.50;  Hemoglobin 12.8; Platelets 416; Potassium 4.0; Sodium 143    Lipid Panel  No results found for: CHOL, TRIG, HDL, CHOLHDL, VLDL, LDLCALC, LDLDIRECT   Wt Readings from Last 3 Encounters:  04/09/19 168 lb 9.6 oz (76.5 kg)  03/12/19 166 lb (75.3 kg)  03/08/18 167 lb 8 oz (76 kg)      Other studies Reviewed: Additional studies/ records that were reviewed today include: TTE 05/18/15  Review of the above records today demonstrates:  - LVEF 60-65%, moderate LVH, normal wall motion , calcified aortic valve with mild regurgitation, upper normal LA size, mild TR, RVSP 37 mmHg, lipomatous hypertrophy of the IAS,  normal IVC.  ETT 06/29/15  Nuclear stress EF: 55%.  There was no ST segment deviation noted during stress. Patient was in atrial fibrillation throughout the study.  There is a small defect of mild severity present in the basal inferolateral and mid inferolateral location. The defect is non-reversible. There is a small defect of mild severity present in the basal inferior and mid inferior location. The defect is non-reversible.There is a small defect of mild severity present in the apex location. The defect is non-reversible. No ischemia noted.  This is a low risk study.  The left ventricular ejection fraction is normal (55-65%).   ASSESSMENT AND PLAN:  1.  Persistent atrial fibrillation: Currently on dofetilide and Xarelto.  Remains in sinus rhythm.  No changes.  This patients CHA2DS2-VASc Score and unadjusted Ischemic Stroke Rate (% per year) is equal to 3.2 % stroke rate/year from a score of 3  Above score calculated as 1 point each if present [CHF, HTN, DM, Vascular=MI/PAD/Aortic Plaque, Age if 65-74, or Male] Above score calculated as 2 points each if present [Age > 75, or Stroke/TIA/TE]   2. Hypertension: Blood pressure is elevated today, but he brings in blood pressures that are normal at home.  He did have an episode in October where his blood pressure got high and  he had chest pain.  He presented to the emergency room with no acute abnormality found.  As he has had normal blood pressures, will make no changes at this time.  Current medicines are reviewed at length with the patient today.   The patient does not have concerns regarding his medicines.  The following changes were made today: None  Labs/ tests ordered today include:  No orders of the defined types were placed in this encounter.    Disposition:   FU with Will Camnitz 6 months  Signed, Will Meredith Leeds, MD  04/09/2019 9:18 AM     [facility] [address] [place] [address] [phone] (office) [phone] (fax)

## 2019-04-10 NOTE — Telephone Encounter (Signed)
Thank you , Austin [name]. I will repeat this test.  Myriam Jacobson, Please inform Mr. Hoglund that this test was a little too short and will need to be repeated. CD

## 2019-04-10 NOTE — Addendum Note (Signed)
Addended by: Larey Seat on: 04/10/2019 11:21 AM   Modules accepted: Orders

## 2019-04-10 NOTE — Telephone Encounter (Signed)
I called pt to discuss. No answer, left a message asking him to call me back. 

## 2019-04-10 NOTE — Telephone Encounter (Signed)
Pt's wife returned my call and I discussed this with her and the pt. They are agreeable to repeating the ONO. I will send this order to [facility]. Pt verbalized understanding of results. Pt had no questions at this time but was encouraged to call back if questions arise.  Order has been sent to [facility].

## 2019-04-22 ENCOUNTER — Encounter: Payer: Self-pay | Admitting: Neurology

## 2019-04-22 ENCOUNTER — Other Ambulatory Visit: Payer: Self-pay | Admitting: Cardiology

## 2019-04-22 DIAGNOSIS — I10 Essential (primary) hypertension: Secondary | ICD-10-CM

## 2019-04-22 DIAGNOSIS — N183 Chronic kidney disease, stage 3 unspecified: Secondary | ICD-10-CM

## 2019-04-22 MED ORDER — FUROSEMIDE 20 MG PO TABS: 20.0000 mg | ORAL_TABLET | Freq: Every day | ORAL | 3 refills | Status: DC

## 2019-04-23 NOTE — Telephone Encounter (Signed)
Result of ONO-  [name] underwent a overnight pulse oximetry test on room air while treated on CPAP.  Total recording time was 8 hours 5 minutes, the patient stayed for over 8 hours at or above 90% oxygen saturations throughout the night.  Nadir for the whole night was 88% but not sustained, he was bradycardic for most of the night the oxygen desaturation index was 2.23/h of sleep.  The patient does not meet qualifications for supplemental oxygen.  This overnight pulse oximetry confirmed that the patient's symptoms are not due to hypoxemia.   Larey Seat, MD    Cc to PCP , Dr. Reynaldo Minium.

## 2019-04-29 ENCOUNTER — Telehealth: Payer: Self-pay | Admitting: Neurology

## 2019-04-29 NOTE — Telephone Encounter (Signed)
Called the patient's wife phone and reviewed the results with her informing her that the report indicated no need to add supplemental oxygen. Patient wife verbalized understanding.     Result of ONO-  Austin [name] underwent a overnight pulse oximetry test on room air while treated on CPAP.  Total recording time was 8 hours 5 minutes, the patient stayed for over 8 hours at or above 90% oxygen saturations throughout the night.  Nadir for the whole night was 88% but not sustained, he was bradycardic for most of the night the oxygen desaturation index was 2.23/h of sleep.  The patient does not meet qualifications for supplemental oxygen.  This overnight pulse oximetry confirmed that the patient's symptoms are not due to hypoxemia.

## 2019-04-30 ENCOUNTER — Other Ambulatory Visit: Payer: Self-pay | Admitting: Physician Assistant

## 2019-04-30 DIAGNOSIS — I10 Essential (primary) hypertension: Secondary | ICD-10-CM

## 2019-04-30 DIAGNOSIS — N183 Chronic kidney disease, stage 3 unspecified: Secondary | ICD-10-CM

## 2019-04-30 DIAGNOSIS — I4819 Other persistent atrial fibrillation: Secondary | ICD-10-CM

## 2019-05-01 ENCOUNTER — Other Ambulatory Visit: Payer: Self-pay | Admitting: Cardiology

## 2019-05-03 ENCOUNTER — Other Ambulatory Visit: Payer: Self-pay | Admitting: Nurse Practitioner

## 2019-05-03 DIAGNOSIS — I10 Essential (primary) hypertension: Secondary | ICD-10-CM

## 2019-05-03 DIAGNOSIS — U071 COVID-19: Secondary | ICD-10-CM

## 2019-05-03 NOTE — Progress Notes (Signed)
  I connected by phone with Austin [name] on 05/03/2019 at 7:52 AM to discuss the potential use of an new treatment for mild to moderate COVID-19 viral infection in non-hospitalized patients.  This patient is a 85 y.o. male that meets the FDA criteria for Emergency Use Authorization of bamlanivimab or casirivimab\imdevimab.  Has a (+) direct SARS-CoV-2 viral test result  Has mild or moderate COVID-19   Is ? 85 years of age and weighs ? 40 kg  Is NOT hospitalized due to COVID-19  Is NOT requiring oxygen therapy or requiring an increase in baseline oxygen flow rate due to COVID-19  Is within 10 days of symptom onset  Has at least one of the high risk factor(s) for progression to severe COVID-19 and/or hospitalization as defined in EUA.  Specific high risk criteria : Hypertension Patient Active Problem List   Diagnosis Date Noted  . Atrial fibrillation and flutter ([place]) 03/08/2018  . OSA on CPAP 03/08/2018  . Atrial fibrillation, persistent ([place]) 08/10/2015  . Atrial fibrillation ([place]) 07/29/2015  . CPAP use counseling 07/29/2015  . OSA and COPD overlap syndrome ([place]) 07/29/2015  . Essential hypertension 06/22/2015  . CKD (chronic kidney disease) 06/22/2015  . Persistent atrial fibrillation ([place])   . Gasping for breath 03/19/2015  . Snoring 03/19/2015  . Asthma, mild persistent 03/19/2015  . COPD exacerbation ([place]) 03/19/2015  . FUO (fever of unknown origin) 03/19/2013  . Melanoma in situ of right eyelid ([place])   . Neurotrophic keratoconjunctivitis 06/01/2011  . Malignant neoplasm of conjunctiva ([place]) 03/02/2011  . Trichiasis of eyelid 03/02/2011     I have spoken and communicated the following to the patient or parent/caregiver:  1. FDA has authorized the emergency use of bamlanivimab and casirivimab\imdevimab for the treatment of mild to moderate COVID-19 in adults and pediatric patients with positive results of direct SARS-CoV-2 viral testing who are 61 years of age and  older weighing at least 40 kg, and who are at high risk for progressing to severe COVID-19 and/or hospitalization.  2. The significant known and potential risks and benefits of bamlanivimab and casirivimab\imdevimab, and the extent to which such potential risks and benefits are unknown.  3. Information on available alternative treatments and the risks and benefits of those alternatives, including clinical trials.  4. Patients treated with bamlanivimab and casirivimab\imdevimab should continue to self-isolate and use infection control measures (e.g., wear mask, isolate, social distance, avoid sharing personal items, clean and disinfect "high touch" surfaces, and frequent handwashing) according to CDC guidelines.   5. The patient or parent/caregiver has the option to accept or refuse bamlanivimab or casirivimab\imdevimab .  After reviewing this information with the patient, The patient agreed to proceed with receiving the bamlanimivab infusion and will be provided a copy of the Fact sheet prior to receiving the infusion.[name] 05/03/2019 7:52 AM   Note: Patient will need to bring a copy of his positive COVID test results from [facility] to be scanned in the chart.

## 2019-05-06 ENCOUNTER — Ambulatory Visit (HOSPITAL_COMMUNITY)
Admission: RE | Admit: 2019-05-06 | Discharge: 2019-05-06 | Disposition: A | Discharge status: Home / Self Care | Payer: Medicare Other | Source: Ambulatory Visit | Attending: Pulmonary Disease | Admitting: Pulmonary Disease

## 2019-05-06 DIAGNOSIS — U071 COVID-19: Secondary | ICD-10-CM | POA: Insufficient documentation

## 2019-05-06 DIAGNOSIS — I10 Essential (primary) hypertension: Secondary | ICD-10-CM | POA: Insufficient documentation

## 2019-05-06 DIAGNOSIS — Z23 Encounter for immunization: Secondary | ICD-10-CM | POA: Diagnosis present

## 2019-05-06 MED ORDER — METHYLPREDNISOLONE SODIUM SUCC 125 MG IJ SOLR: 125.0000 mg | Freq: Once | INTRAMUSCULAR | Status: DC | PRN

## 2019-05-06 MED ORDER — FAMOTIDINE IN NACL 20-0.9 MG/50ML-% IV SOLN: 20.0000 mg | Freq: Once | INTRAVENOUS | Status: DC | PRN

## 2019-05-06 MED ORDER — ALBUTEROL SULFATE HFA 108 (90 BASE) MCG/ACT IN AERS: 2.0000 | INHALATION_SPRAY | Freq: Once | RESPIRATORY_TRACT | Status: DC | PRN

## 2019-05-06 MED ORDER — DIPHENHYDRAMINE HCL 50 MG/ML IJ SOLN: 50.0000 mg | Freq: Once | INTRAMUSCULAR | Status: DC | PRN

## 2019-05-06 MED ORDER — SODIUM CHLORIDE 0.9 % IV SOLN
INTRAVENOUS | Status: DC | PRN
  Administered 2019-05-06: 250 mL via INTRAVENOUS

## 2019-05-06 MED ORDER — EPINEPHRINE 0.3 MG/0.3ML IJ SOAJ: 0.3000 mg | Freq: Once | INTRAMUSCULAR | Status: DC | PRN

## 2019-05-06 MED ORDER — SODIUM CHLORIDE 0.9 % IV SOLN
700.0000 mg | Freq: Once | INTRAVENOUS | Status: AC
  Administered 2019-05-06: 10:00:00 700 mg via INTRAVENOUS
  Filled 2019-05-06: qty 20

## 2019-05-06 NOTE — Progress Notes (Signed)
Patient ID: Austin [name], male   DOB: [birth year], 85 y.o.   MRN: [id number]  Diagnosis: COVID-19  Physician: Dr. Joya Gaskins  Procedure: [facility] Med: bamlanivimab infusion - Provided patient with bamlanimivab fact sheet for patients, parents and caregivers prior to infusion.  Complications: No immediate complications noted.  Discharge: Discharged home   [place], [place] 05/06/2019

## 2019-05-06 NOTE — Progress Notes (Signed)
Patient ID: Austin [name], male   DOB: 1931/03/02, 85 y.o.   MRN: [id number]  Diagnosis: [id number]  Physician:  Procedure: [facility] Med: bamlanivimab infusion - Provided patient with bamlanimivab fact sheet for patients, parents and caregivers prior to infusion.  Complications: No immediate complications noted.  Discharge: Discharged home   [place], [place] 05/06/2019

## 2019-05-06 NOTE — Discharge Instructions (Signed)
COVID-19 °COVID-19 is a respiratory infection that is caused by a virus called severe acute respiratory syndrome coronavirus 2 (SARS-CoV-2). The disease is also known as coronavirus disease or novel coronavirus. In some people, the virus may not cause any symptoms. In others, it may cause a serious infection. The infection can get worse quickly and can lead to complications, such as: °· Pneumonia, or infection of the lungs. °· Acute respiratory distress syndrome or ARDS. This is fluid build-up in the lungs. °· Acute respiratory failure. This is a condition in which there is not enough oxygen passing from the lungs to the body. °· Sepsis or septic shock. This is a serious bodily reaction to an infection. °· Blood clotting problems. °· Secondary infections due to bacteria or fungus. °The virus that causes COVID-19 is contagious. This means that it can spread from person to person through droplets from coughs and sneezes (respiratory secretions). °What are the causes? °This illness is caused by a virus. You may catch the virus by: °· Breathing in droplets from an infected person's cough or sneeze. °· Touching something, like a table or a doorknob, that was exposed to the virus (contaminated) and then touching your mouth, nose, or eyes. °What increases the risk? °Risk for infection °You are more likely to be infected with this virus if you: °· Live in or travel to an area with a COVID-19 outbreak. °· Come in contact with a sick person who recently traveled to an area with a COVID-19 outbreak. °· Provide care for or live with a person who is infected with COVID-19. °Risk for serious illness °You are more likely to become seriously ill from the virus if you: °· Are 65 years of age or older. °· Have a long-term disease that lowers your body's ability to fight infection (immunocompromised). °· Live in a nursing home or long-term care facility. °· Have a long-term (chronic) disease such as: °? Chronic lung disease, including  chronic obstructive pulmonary disease or asthma °? Heart disease. °? Diabetes. °? Chronic kidney disease. °? Liver disease. °· Are obese. °What are the signs or symptoms? °Symptoms of this condition can range from mild to severe. Symptoms may appear any time from 2 to 14 days after being exposed to the virus. They include: °· A fever. °· A cough. °· Difficulty breathing. °· Chills. °· Muscle pains. °· A sore throat. °· Loss of taste or smell. °Some people may also have stomach problems, such as nausea, vomiting, or diarrhea. °Other people may not have any symptoms of COVID-19. °How is this diagnosed? °This condition may be diagnosed based on: °· Your signs and symptoms, especially if: °? You live in an area with a COVID-19 outbreak. °? You recently traveled to or from an area where the virus is common. °? You provide care for or live with a person who was diagnosed with COVID-19. °· A physical exam. °· Lab tests, which may include: °? A nasal swab to take a sample of fluid from your nose. °? A throat swab to take a sample of fluid from your throat. °? A sample of mucus from your lungs (sputum). °? Blood tests. °· Imaging tests, which may include, X-rays, CT scan, or ultrasound. °How is this treated? °At present, there is no medicine to treat COVID-19. Medicines that treat other diseases are being used on a trial basis to see if they are effective against COVID-19. °Your health care provider will talk with you about ways to treat your symptoms. For most   people, the infection is mild and can be managed at home with rest, fluids, and over-the-counter medicines. °Treatment for a serious infection usually takes places in a hospital intensive care unit (ICU). It may include one or more of the following treatments. These treatments are given until your symptoms improve. °· Receiving fluids and medicines through an IV. °· Supplemental oxygen. Extra oxygen is given through a tube in the nose, a face mask, or a  hood. °· Positioning you to lie on your stomach (prone position). This makes it easier for oxygen to get into the lungs. °· Continuous positive airway pressure (CPAP) or bi-level positive airway pressure (BPAP) machine. This treatment uses mild air pressure to keep the airways open. A tube that is connected to a motor delivers oxygen to the body. °· Ventilator. This treatment moves air into and out of the lungs by using a tube that is placed in your windpipe. °· Tracheostomy. This is a procedure to create a hole in the neck so that a breathing tube can be inserted. °· Extracorporeal membrane oxygenation (ECMO). This procedure gives the lungs a chance to recover by taking over the functions of the heart and lungs. It supplies oxygen to the body and removes carbon dioxide. °Follow these instructions at home: °Lifestyle °· If you are sick, stay home except to get medical care. Your health care provider will tell you how long to stay home. Call your health care provider before you go for medical care. °· Rest at home as told by your health care provider. °· Do not use any products that contain nicotine or tobacco, such as cigarettes, e-cigarettes, and chewing tobacco. If you need help quitting, ask your health care provider. °· Return to your normal activities as told by your health care provider. Ask your health care provider what activities are safe for you. °General instructions °· Take over-the-counter and prescription medicines only as told by your health care provider. °· Drink enough fluid to keep your urine pale yellow. °· Keep all follow-up visits as told by your health care provider. This is important. °How is this prevented? ° °There is no vaccine to help prevent COVID-19 infection. However, there are steps you can take to protect yourself and others from this virus. °To protect yourself:  °· Do not travel to areas where COVID-19 is a risk. The areas where COVID-19 is reported change often. To identify  high-risk areas and travel restrictions, check the CDC travel website: wwwnc.cdc.gov/travel/notices °· If you live in, or must travel to, an area where COVID-19 is a risk, take precautions to avoid infection. °? Stay away from people who are sick. °? Wash your hands often with soap and water for 20 seconds. If soap and water are not available, use an alcohol-based hand sanitizer. °? Avoid touching your mouth, face, eyes, or nose. °? Avoid going out in public, follow guidance from your state and local health authorities. °? If you must go out in public, wear a cloth face covering or face mask. °? Disinfect objects and surfaces that are frequently touched every day. This may include: °§ Counters and tables. °§ Doorknobs and light switches. °§ Sinks and faucets. °§ Electronics, such as phones, remote controls, keyboards, computers, and tablets. °To protect others: °If you have symptoms of COVID-19, take steps to prevent the virus from spreading to others. °· If you think you have a COVID-19 infection, contact your health care provider right away. Tell your health care team that you think you may   have a COVID-19 infection. °· Stay home. Leave your house only to seek medical care. Do not use public transport. °· Do not travel while you are sick. °· Wash your hands often with soap and water for 20 seconds. If soap and water are not available, use alcohol-based hand sanitizer. °· Stay away from other members of your household. Let healthy household members care for children and pets, if possible. If you have to care for children or pets, wash your hands often and wear a mask. If possible, stay in your own room, separate from others. Use a different bathroom. °· Make sure that all people in your household wash their hands well and often. °· Cough or sneeze into a tissue or your sleeve or elbow. Do not cough or sneeze into your hand or into the air. °· Wear a cloth face covering or face mask. °Where to find more  information °· Centers for Disease Control and Prevention: www.cdc.gov/coronavirus/2019-ncov/index.html °· World Health Organization: www.who.int/health-topics/coronavirus °Contact a health care provider if: °· You live in or have traveled to an area where COVID-19 is a risk and you have symptoms of the infection. °· You have had contact with someone who has COVID-19 and you have symptoms of the infection. °Get help right away if: °· You have trouble breathing. °· You have pain or pressure in your chest. °· You have confusion. °· You have bluish lips and fingernails. °· You have difficulty waking from sleep. °· You have symptoms that get worse. °These symptoms may represent a serious problem that is an emergency. Do not wait to see if the symptoms will go away. Get medical help right away. Call your local emergency services (911 in the [place] Do not drive yourself to the hospital. Let the emergency medical personnel know if you think you have COVID-19. °Summary °· COVID-19 is a respiratory infection that is caused by a virus. It is also known as coronavirus disease or novel coronavirus. It can cause serious infections, such as pneumonia, acute respiratory distress syndrome, acute respiratory failure, or sepsis. °· The virus that causes COVID-19 is contagious. This means that it can spread from person to person through droplets from coughs and sneezes. °· You are more likely to develop a serious illness if you are 65 years of age or older, have a weak immunity, live in a nursing home, or have chronic disease. °· There is no medicine to treat COVID-19. Your health care provider will talk with you about ways to treat your symptoms. °· Take steps to protect yourself and others from infection. Wash your hands often and disinfect objects and surfaces that are frequently touched every day. Stay away from people who are sick and wear a mask if you are sick. °This information is not intended to replace advice given to you by  your health care provider. Make sure you discuss any questions you have with your health care provider. °Document Released: 06/07/2018 Document Revised: 09/27/2018 Document Reviewed: 06/07/2018 °Elsevier Patient Education © 2020 [employer] ° °COVID-19: How to Protect Yourself and Others °Know how it spreads °· There is currently no vaccine to prevent coronavirus disease 2019 (COVID-19). °· The best way to prevent illness is to avoid being exposed to this virus. °· The virus is thought to spread mainly from person-to-person. °? Between people who are in close contact with one another (within about 6 feet). °? Through respiratory droplets produced when an infected person coughs, sneezes or talks. °? These droplets can land in   the mouths or noses of people who are nearby or possibly be inhaled into the lungs. °? Some recent studies have suggested that COVID-19 may be spread by people who are not showing symptoms. °Everyone should °Clean your hands often °· Wash your hands often with soap and water for at least 20 seconds especially after you have been in a public place, or after blowing your nose, coughing, or sneezing. °· If soap and water are not readily available, use a hand sanitizer that contains at least 60% alcohol. Cover all surfaces of your hands and rub them together until they feel dry. °· Avoid touching your eyes, nose, and mouth with unwashed hands. °Avoid close contact °· Stay home if you are sick. °· Avoid close contact with people who are sick. °· Put distance between yourself and other people. °? Remember that some people without symptoms may be able to spread virus. °? This is especially important for people who are at higher risk of getting very sick.www.cdc.gov/coronavirus/2019-ncov/need-extra-precautions/people-at-higher-risk.html °Cover your mouth and nose with a cloth face cover when around others °· You could spread COVID-19 to others even if you do not feel sick. °· Everyone should wear a  cloth face cover when they have to go out in public, for example to the grocery store or to pick up other necessities. °? Cloth face coverings should not be placed on young children under age 2, anyone who has trouble breathing, or is unconscious, incapacitated or otherwise unable to remove the mask without assistance. °· The cloth face cover is meant to protect other people in case you are infected. °· Do NOT use a facemask meant for a healthcare worker. °· Continue to keep about 6 feet between yourself and others. The cloth face cover is not a substitute for social distancing. °Cover coughs and sneezes °· If you are in a private setting and do not have on your cloth face covering, remember to always cover your mouth and nose with a tissue when you cough or sneeze or use the inside of your elbow. °· Throw used tissues in the trash. °· Immediately wash your hands with soap and water for at least 20 seconds. If soap and water are not readily available, clean your hands with a hand sanitizer that contains at least 60% alcohol. °Clean and disinfect °· Clean AND disinfect frequently touched surfaces daily. This includes tables, doorknobs, light switches, countertops, handles, desks, phones, keyboards, toilets, faucets, and sinks. www.cdc.gov/coronavirus/2019-ncov/prevent-getting-sick/disinfecting-your-home.html °· If surfaces are dirty, clean them: Use detergent or soap and water prior to disinfection. °· Then, use a household disinfectant. You can see a list of EPA-registered household disinfectants here. °cdc.gov/coronavirus °09/18/2018 °This information is not intended to replace advice given to you by your health care provider. Make sure you discuss any questions you have with your health care provider. °Document Released: 08/28/2018 Document Revised: 09/26/2018 Document Reviewed: 08/28/2018 °Elsevier Patient Education © 2020 [employer]. ° °

## 2019-07-10 ENCOUNTER — Other Ambulatory Visit (HOSPITAL_COMMUNITY): Payer: Self-pay | Admitting: Cardiology

## 2019-07-10 NOTE — Telephone Encounter (Signed)
Pt last saw Dr Curt Bears 04/09/19, last labs 03/14/19 Creat 1.50, age 86, weight 76.5kg, CrCl 38.25, based on CrCl pt is on appropriate dosage of Xarelto 15mg  QD.  Will refill rx.

## 2019-07-11 ENCOUNTER — Ambulatory Visit: Payer: Medicare PPO

## 2019-08-06 ENCOUNTER — Ambulatory Visit: Payer: Medicare PPO | Attending: Internal Medicine

## 2019-08-06 ENCOUNTER — Ambulatory Visit: Payer: Medicare PPO

## 2019-08-06 DIAGNOSIS — Z23 Encounter for immunization: Secondary | ICD-10-CM

## 2019-08-06 NOTE — Progress Notes (Signed)
   Covid-19 Vaccination Clinic  Name:  Austin [name]    MRN: [id number] DOB: September 12, 1930  08/06/2019  Austin [name] was observed post Covid-19 immunization for 15 minutes without incident. He was provided with Vaccine Information Sheet and instruction to access the V-Safe system.   Austin [name] was instructed to call 911 with any severe reactions post vaccine: Marland Kitchen Difficulty breathing  . Swelling of face and throat  . A fast heartbeat  . A bad rash all over body  . Dizziness and weakness   Immunizations Administered    Name Date Dose VIS Date Route   Pfizer COVID-19 Vaccine 08/06/2019  9:03 AM 0.3 mL 04/26/2019 Intramuscular   Manufacturer: [place]   Lot: [id number]   [place]: [id number]

## 2019-08-20 ENCOUNTER — Other Ambulatory Visit: Payer: Self-pay | Admitting: Cardiology

## 2019-08-28 ENCOUNTER — Ambulatory Visit: Payer: Medicare PPO | Attending: Internal Medicine

## 2019-08-28 DIAGNOSIS — Z23 Encounter for immunization: Secondary | ICD-10-CM

## 2019-08-28 NOTE — Progress Notes (Signed)
   Covid-19 Vaccination Clinic  Name:  Austin [name]    MRN: [id number] DOB: Sep 30, 1930  08/28/2019  Austin [name] was observed post Covid-19 immunization for 30 minutes based on pre-vaccination screening without incident. He was provided with Vaccine Information Sheet and instruction to access the V-Safe system.   Austin [name] was instructed to call 911 with any severe reactions post vaccine: Marland Kitchen Difficulty breathing  . Swelling of face and throat  . A fast heartbeat  . A bad rash all over body  . Dizziness and weakness   Immunizations Administered    Name Date Dose VIS Date Route   Pfizer COVID-19 Vaccine 08/28/2019 12:08 PM 0.3 mL 04/26/2019 Intramuscular   Manufacturer: [place]   Lot: [id number]   [place]: [id number]

## 2019-09-10 ENCOUNTER — Other Ambulatory Visit: Payer: Self-pay

## 2019-09-10 ENCOUNTER — Ambulatory Visit: Payer: Medicare PPO | Admitting: Adult Health

## 2019-09-10 ENCOUNTER — Encounter: Payer: Self-pay | Admitting: Adult Health

## 2019-09-10 VITALS — BP 135/86 | HR 88 | Temp 97.0°F | Ht 68.0 in | Wt 170.0 lb

## 2019-09-10 DIAGNOSIS — J449 Chronic obstructive pulmonary disease, unspecified: Secondary | ICD-10-CM | POA: Diagnosis not present

## 2019-09-10 DIAGNOSIS — G4733 Obstructive sleep apnea (adult) (pediatric): Secondary | ICD-10-CM

## 2019-09-10 NOTE — Patient Instructions (Signed)
Continue using CPAP nightly and greater than 4 hours each night °If your symptoms worsen or you develop new symptoms please let us know.  ° °

## 2019-09-10 NOTE — Progress Notes (Signed)
PATIENT: Austin [name] DOB: 12-May-1931  REASON FOR VISIT: follow up HISTORY FROM: patient  HISTORY OF PRESENT ILLNESS: Today 09/10/19:   Austin [name] is an 86 year old male with a history of obstructive sleep apnea on CPAP.  He returns today for follow-up.  He reports that he has 2 machines.  [place]  He states that he recently went to the beach for a weekend plus he also had 1 week that he was unable to use the machine as it was being fixed.  His download indicates that he uses machine 14 out of 30 days for compliance of 47%.  He uses machine greater than 4 hours each night.  On average he uses his machine 9 hours and 5 minutes.  His residual AHI is 0.9 on 6 to 10 cm of water with EPR 3.  Leak in the 95th percentile is 6.5 L/min.  He returns today for an evaluation.  HISTORY (Copied from Dr.Dohmeier's note) 03/12/19: CD - Austin [name] is a 86 year old Caucasian gentleman and uses CPAP compliantly.   His ResMed air view air sense machine has been used 100% of the last 30 days with the last data from 11 March 2019.  Average user x8 hours 50 minutes minimum pressure of 6 maximum pressure 10 and EPR level is 2 cmH2O, the 95th percentile pressure is 9.9 cmH2O there are no significant residual apneas observed the AHI is 0.5/h and the air leak is very low.  The patient is a diagnosis of OSA overlapping with COPD.  He still endorsed the Epworth Sleepiness Scale at 12 out of 24 points and the fatigue severity scale is 30 out of 63 the geriatric depression score at 1 out of 15.  His wife states that he has been a entirely new person since using the new settings.  And he has been much less sleepy and fatigue and daytime, however it would be worthwhile looking for a pulse oximetry on CPAP to make sure that he does not suffer from hypoxic episodes in spite of a controlled sleep apnea.   REVIEW OF SYSTEMS: Out of a complete 14 system review of symptoms, the patient  complains only of the following symptoms, and all other reviewed systems are negative.  ESS 7  ALLERGIES: Allergies  Allergen Reactions  . Bee Venom Anaphylaxis  . Diltiazem Other (See Comments)    Severe headaches 120 mg BID  . Codeine Nausea And Vomiting  . Ivp Dye [Iodinated Diagnostic Agents] Rash  . Penicillins Swelling and Rash    Has patient had a PCN reaction causing immediate rash, facial/tongue/throat swelling, SOB or lightheadedness with hypotension: Yes Has patient had a PCN reaction causing severe rash involving mucus membranes or skin necrosis: No Has patient had a PCN reaction that required hospitalization No Has patient had a PCN reaction occurring within the last 10 years: No If all of the above answers are "NO", then may proceed with Cephalosporin use.  . Tape Itching and Rash    Redness.  Please use "paper" tape only    HOME MEDICATIONS: Outpatient Medications Prior to Visit  Medication Sig Dispense Refill  . acetaminophen (TYLENOL) 500 MG tablet Take 1,000 mg by mouth daily as needed for mild pain or headache.    . albuterol (PROAIR HFA) 108 (90 Base) MCG/ACT inhaler Inhale 2 puffs into the lungs every 4 (four) hours as needed for wheezing or shortness of breath (cough).    Marland Kitchen  budesonide-formoterol (SYMBICORT) 160-4.5 MCG/ACT inhaler Inhale 2 puffs into the lungs 2 (two) times daily.    . butalbital-acetaminophen-caffeine (FIORICET, ESGIC) 50-325-40 MG tablet Take 1 tablet by mouth every 4 (four) hours as needed (severe headache). Maximum 4 tablets per day  5  . carvedilol (COREG) 25 MG tablet Take 1 tablet (25 mg total) by mouth 2 (two) times daily. 180 tablet 3  . clobetasol cream (TEMOVATE) AB-123456789 % Apply 1 application topically daily as needed (for itching).     . dofetilide (TIKOSYN) 125 MCG capsule Take 1 capsule (125 mcg total) by mouth 2 (two) times daily. 180 capsule 3  . EPINEPHrine 0.15 MG/0.15ML IJ injection Inject 0.15 mg into the muscle as needed for  anaphylaxis.    Marland Kitchen erythromycin ophthalmic ointment Place 1 application into the right eye 4 (four) times daily.    . fexofenadine (ALLEGRA ALLERGY) 60 MG tablet Take 60 mg by mouth every evening.     . fluticasone (FLONASE) 50 MCG/ACT nasal spray Place 2 sprays into both nostrils at bedtime.   8  . furosemide (LASIX) 20 MG tablet Take 1 tablet (20 mg total) by mouth daily. 90 tablet 3  . gabapentin (NEURONTIN) 300 MG capsule Take 300 mg by mouth at bedtime.    . hydrOXYzine (ATARAX/VISTARIL) 25 MG tablet Take 37.5 mg by mouth at bedtime.    . indomethacin (INDOCIN) 50 MG capsule Take 50 mg by mouth daily as needed (for gout flare up).     . isosorbide mononitrate (IMDUR) 60 MG 24 hr tablet TAKE 1 TABLET BY MOUTH EVERY DAY 90 tablet 2  . KLOR-CON M20 20 MEQ tablet TAKE 1 TABLET BY MOUTH EVERY DAY 90 tablet 3  . MAGNESIUM CITRATE PO Take 250 mg by mouth at bedtime.    . moxifloxacin (VIGAMOX) 0.5 % ophthalmic solution Place 1 drop into the right eye 4 (four) times daily.    Marland Kitchen olmesartan (BENICAR) 40 MG tablet TAKE 1 TABLET BY MOUTH EVERY DAY. *REPLACES LOSARTAN 90 tablet 2  . Polyethyl Glycol-Propyl Glycol (SYSTANE PRESERVATIVE FREE) 0.4-0.3 % SOLN Place 1 drop into both eyes 2 (two) times daily.    . prednisoLONE acetate (PRED FORTE) 1 % ophthalmic suspension Place 1 drop into the right eye daily.     [name] 15 MG TABS tablet TAKE 1 TABLET BY MOUTH EVERY DAY WITH SUPPER 90 tablet 1  . zolpidem (AMBIEN) 10 MG tablet Take 10 mg by mouth at bedtime as needed for sleep.   5   No facility-administered medications prior to visit.    PAST MEDICAL HISTORY: Past Medical History:  Diagnosis Date  . Allergy   . Asthma   . BPH (benign prostatic hypertrophy)   . CKD (chronic kidney disease) stage 3, GFR 30-59 ml/min   . Cough   . Gout   . Hypertensive chronic kidney disease   . Melanoma in situ of right eyelid ([place])   . OSA (obstructive sleep apnea)   . Persistent atrial fibrillation ([place])      PAST SURGICAL HISTORY: Past Surgical History:  Procedure Laterality Date  . CARDIOVERSION [name] 06/05/2015   Procedure: CARDIOVERSION;  Surgeon: Sanda Klein, MD;  Location: [facility];  Service: Cardiovascular;  Laterality: [name];  . EYE SURGERY  12/2017    FAMILY HISTORY: Family History  Problem Relation Age of Onset  . Hypertension Father   . Lung cancer Father   . Hypertension Mother     SOCIAL HISTORY: Social History   Socioeconomic  History  . Marital status: Married    Spouse name: [name]  . Number of children: Not on file  . Years of education: Not on file  . Highest education level: Not on file  Occupational History  . Not on file  Tobacco Use  . Smoking status: Never Smoker  . Smokeless tobacco: Never Used  Substance and Sexual Activity  . Alcohol use: Yes    Alcohol/week: 1.0 standard drinks    Types: 1 Standard drinks or equivalent per week    Comment: occ  . Drug use: No  . Sexual activity: Not on file  Other Topics Concern  . Not on file  Social History Narrative   Right Handed.  Caffiene  Coffee, tea 3 cups daily.   Pt retired.     Lives at home with wife.     Social Determinants of Health   Financial Resource Strain:   . Difficulty of Paying Living Expenses:   Food Insecurity:   . Worried About Charity fundraiser in the Last Year:   . Arboriculturist in the Last Year:   Transportation Needs:   . Film/video editor (Medical):   Marland Kitchen Lack of Transportation (Non-Medical):   Physical Activity:   . Days of Exercise per Week:   . Minutes of Exercise per Session:   Stress:   . Feeling of Stress :   Social Connections:   . Frequency of Communication with Friends and Family:   . Frequency of Social Gatherings with Friends and Family:   . Attends Religious Services:   . Active Member of Clubs or Organizations:   . Attends Archivist Meetings:   Marland Kitchen Marital Status:   Intimate Partner Violence:   . Fear of Current or Ex-Partner:   .  Emotionally Abused:   Marland Kitchen Physically Abused:   . Sexually Abused:       PHYSICAL EXAM  Vitals:   09/10/19 0851  BP: 135/86  Pulse: 88  Temp: (!) 97 F (36.1 C)  Weight: 170 lb (77.1 kg)  Height: 5\' 8"  (1.727 m)   Body mass index is 25.85 kg/m.  Generalized: Well developed, in no acute distress  Chest: Lungs clear to auscultation bilaterally  Neurological examination  Mentation: Alert oriented to time, place, history taking. Follows all commands speech and language fluent Cranial nerve II-XII: Extraocular movements were full, visual field were full on confrontational test Head turning and shoulder shrug  were normal and symmetric. Motor: The motor testing reveals 5 over 5 strength of all 4 extremities. Good symmetric motor tone is noted throughout.  Sensory: Sensory testing is intact to soft touch on all 4 extremities. No evidence of extinction is noted.  Gait and station: Gait is normal.    DIAGNOSTIC DATA (LABS, IMAGING, TESTING) - I reviewed patient records, labs, notes, testing and imaging myself where available.  Lab Results  Component Value Date   WBC 8.7 03/14/2019   HGB 12.8 (L) 03/14/2019   HCT 40.4 03/14/2019   MCV 93.7 03/14/2019   PLT 416 (H) 03/14/2019      Component Value Date/Time   NA 143 03/14/2019 0125   K 4.0 03/14/2019 0125   CL 107 03/14/2019 0125   CO2 26 03/14/2019 0125   GLUCOSE 111 (H) 03/14/2019 0125   BUN 15 03/14/2019 0125   CREATININE 1.50 (H) 03/14/2019 0125   CREATININE 1.34 (H) 03/09/2016 1045   CALCIUM 8.8 (L) 03/14/2019 0125   PROT 5.7 (L) 03/14/2019 0125  ALBUMIN 3.0 (L) 03/14/2019 0125   AST 14 (L) 03/14/2019 0125   ALT 12 03/14/2019 0125   ALKPHOS 63 03/14/2019 0125   BILITOT 0.4 03/14/2019 0125   GFRNONAA 42 (L) 03/14/2019 0125   GFRAA 48 (L) 03/14/2019 0125      ASSESSMENT AND PLAN 86 y.o. year old male  has a past medical history of Allergy, Asthma, BPH (benign prostatic hypertrophy), CKD (chronic kidney disease)  stage 3, GFR 30-59 ml/min, Cough, Gout, Hypertensive chronic kidney disease, Melanoma in situ of right eyelid ([place]), OSA (obstructive sleep apnea), and Persistent atrial fibrillation ([place]). here with:  1. OSA on CPAP  - CPAP compliance good: [place] I do not have that data. - Good treatment of AHI  - Encourage patient to use CPAP nightly and > 4 hours each night - F/U in 1 year or sooner if needed   I spent 20 minutes of face-to-face and non-face-to-face time with patient.  This included previsit chart review, lab review, study review, order entry, electronic health record documentation, patient education.  Ward Givens, MSN, NP-C 09/10/2019, 9:33 AM [facility] [address], [place], [place] [address] [phone]

## 2019-09-10 NOTE — Progress Notes (Signed)
Message sent to [facility] for new cpap orders, received. sy

## 2019-09-18 NOTE — Progress Notes (Signed)
PCP:  Burnard Bunting, MD Primary Cardiologist: Will Meredith Leeds, MD Electrophysiologist: Will Meredith Leeds, MD   Austin [name] is a 86 y.o. male seen today for Will Meredith Leeds, MD for routine electrophysiology followup.  Since last being seen in our clinic the patient reports doing poorly over the past week. He states he has had profound fatigue and is tearful. He feels as if he did previously when he was in AF. Denies tachy palpitations. He has occasional lightheadedness with rapid standing. Not marked or limiting. Not every time. He has mild chest discomfort at times, not always with exertion. He denies orthopnea or peripheral edema.   Past Medical History:  Diagnosis Date  . Allergy   . Asthma   . BPH (benign prostatic hypertrophy)   . CKD (chronic kidney disease) stage 3, GFR 30-59 ml/min   . Cough   . Gout   . Hypertensive chronic kidney disease   . Melanoma in situ of right eyelid ([place])   . OSA (obstructive sleep apnea)   . Persistent atrial fibrillation [facility])    Past Surgical History:  Procedure Laterality Date  . CARDIOVERSION [name] 06/05/2015   Procedure: CARDIOVERSION;  Surgeon: Sanda Klein, MD;  Location: [facility];  Service: Cardiovascular;  Laterality: [name];  . EYE SURGERY  12/2017    Current Outpatient Medications  Medication Sig Dispense Refill  . acetaminophen (TYLENOL) 500 MG tablet Take 1,000 mg by mouth daily as needed for mild pain or headache.    . albuterol (PROAIR HFA) 108 (90 Base) MCG/ACT inhaler Inhale 2 puffs into the lungs every 4 (four) hours as needed for wheezing or shortness of breath (cough).    . budesonide-formoterol (SYMBICORT) 160-4.5 MCG/ACT inhaler Inhale 2 puffs into the lungs 2 (two) times daily.    . butalbital-acetaminophen-caffeine (FIORICET, ESGIC) 50-325-40 MG tablet Take 1 tablet by mouth every 4 (four) hours as needed (severe headache). Maximum 4 tablets per day  5  . carvedilol (COREG) 25 MG tablet Take 1 tablet (25  mg total) by mouth 2 (two) times daily. 180 tablet 3  . clobetasol cream (TEMOVATE) AB-123456789 % Apply 1 application topically daily as needed (for itching).     . dofetilide (TIKOSYN) 125 MCG capsule Take 1 capsule (125 mcg total) by mouth 2 (two) times daily. 180 capsule 3  . EPINEPHrine 0.15 MG/0.15ML IJ injection Inject 0.15 mg into the muscle as needed for anaphylaxis.    Marland Kitchen erythromycin ophthalmic ointment Place 1 application into the right eye 4 (four) times daily.    . fexofenadine (ALLEGRA ALLERGY) 60 MG tablet Take 60 mg by mouth every evening.     . fluticasone (FLONASE) 50 MCG/ACT nasal spray Place 2 sprays into both nostrils at bedtime.   8  . furosemide (LASIX) 20 MG tablet Take 1 tablet (20 mg total) by mouth daily. 90 tablet 3  . gabapentin (NEURONTIN) 300 MG capsule Take 300 mg by mouth at bedtime.    . hydrOXYzine (ATARAX/VISTARIL) 25 MG tablet Take 37.5 mg by mouth at bedtime.    . indomethacin (INDOCIN) 50 MG capsule Take 50 mg by mouth daily as needed (for gout flare up).     . isosorbide mononitrate (IMDUR) 60 MG 24 hr tablet TAKE 1 TABLET BY MOUTH EVERY DAY 90 tablet 2  . KLOR-CON M20 20 MEQ tablet TAKE 1 TABLET BY MOUTH EVERY DAY 90 tablet 3  . MAGNESIUM CITRATE PO Take 250 mg by mouth at bedtime.    . moxifloxacin (  VIGAMOX) 0.5 % ophthalmic solution Place 1 drop into the right eye 4 (four) times daily.    Marland Kitchen neomycin-polymyxin b-dexamethasone (MAXITROL) 3.5-10000-0.1 OINT     . olmesartan (BENICAR) 40 MG tablet TAKE 1 TABLET BY MOUTH EVERY DAY. *REPLACES LOSARTAN 90 tablet 2  . Polyethyl Glycol-Propyl Glycol (SYSTANE PRESERVATIVE FREE) 0.4-0.3 % SOLN Place 1 drop into both eyes 2 (two) times daily.    . prednisoLONE acetate (PRED FORTE) 1 % ophthalmic suspension Place 1 drop into the right eye daily.     [name] 15 MG TABS tablet TAKE 1 TABLET BY MOUTH EVERY DAY WITH SUPPER 90 tablet 1  . zolpidem (AMBIEN) 10 MG tablet Take 10 mg by mouth at bedtime as needed for sleep.   5    No current facility-administered medications for this visit.    Allergies  Allergen Reactions  . Bee Venom Anaphylaxis  . Diltiazem Other (See Comments)    Severe headaches 120 mg BID  . Codeine Nausea And Vomiting  . Ivp Dye [Iodinated Diagnostic Agents] Rash  . Penicillins Swelling and Rash    Has patient had a PCN reaction causing immediate rash, facial/tongue/throat swelling, SOB or lightheadedness with hypotension: Yes Has patient had a PCN reaction causing severe rash involving mucus membranes or skin necrosis: No Has patient had a PCN reaction that required hospitalization No Has patient had a PCN reaction occurring within the last 10 years: No If all of the above answers are "NO", then may proceed with Cephalosporin use.  . Tape Itching and Rash    Redness.  Please use "paper" tape only    Social History   Socioeconomic History  . Marital status: Married    Spouse name: [name]  . Number of children: Not on file  . Years of education: Not on file  . Highest education level: Not on file  Occupational History  . Not on file  Tobacco Use  . Smoking status: Never Smoker  . Smokeless tobacco: Never Used  Substance and Sexual Activity  . Alcohol use: Yes    Alcohol/week: 1.0 standard drinks    Types: 1 Standard drinks or equivalent per week    Comment: occ  . Drug use: No  . Sexual activity: Not on file  Other Topics Concern  . Not on file  Social History Narrative   Right Handed.  Caffiene  Coffee, tea 3 cups daily.   Pt retired.     Lives at home with wife.     Social Determinants of Health   Financial Resource Strain:   . Difficulty of Paying Living Expenses:   Food Insecurity:   . Worried About Charity fundraiser in the Last Year:   . Arboriculturist in the Last Year:   Transportation Needs:   . Film/video editor (Medical):   Marland Kitchen Lack of Transportation (Non-Medical):   Physical Activity:   . Days of Exercise per Week:   . Minutes of Exercise per  Session:   Stress:   . Feeling of Stress :   Social Connections:   . Frequency of Communication with Friends and Family:   . Frequency of Social Gatherings with Friends and Family:   . Attends Religious Services:   . Active Member of Clubs or Organizations:   . Attends Archivist Meetings:   Marland Kitchen Marital Status:   Intimate Partner Violence:   . Fear of Current or Ex-Partner:   . Emotionally Abused:   Marland Kitchen Physically Abused:   .  Sexually Abused:      Review of Systems: General: No chills, fever, night sweats or weight changes  Cardiovascular:  No chest pain, dyspnea on exertion, edema, orthopnea, palpitations, paroxysmal nocturnal dyspnea Dermatological: No rash, lesions or masses Respiratory: No cough, dyspnea Urologic: No hematuria, dysuria Abdominal: No nausea, vomiting, diarrhea, bright red blood per rectum, melena, or hematemesis Neurologic: No visual changes, weakness, changes in mental status All other systems reviewed and are otherwise negative except as noted above.  Physical Exam: Vitals:   09/19/19 1047  BP: (!) 148/86  Pulse: (!) 55  Weight: 167 lb (75.8 kg)  Height: 5\' 8"  (1.727 m)    GEN- The patient is well appearing, alert and oriented x 3 today.   HEENT: normocephalic, atraumatic; sclera clear, conjunctiva pink; hearing intact; oropharynx clear; neck supple, no JVP Lymph- no cervical lymphadenopathy Lungs- Clear to ausculation bilaterally, normal work of breathing.  No wheezes, rales, rhonchi Heart- Regular rate and rhythm, no murmurs, rubs or gallops, PMI not laterally displaced GI- soft, non-tender, non-distended, bowel sounds present, no hepatosplenomegaly Extremities- no clubbing, cyanosis, or edema; DP/PT/radial pulses 2+ bilaterally MS- no significant deformity or atrophy Skin- warm and dry, no rash or lesion Psych- euthymic mood, full affect Neuro- strength and sensation are intact  EKG is ordered. Personal review of EKG from today shows  Sinus bradycardia with frequent PACs at 55 bpm, with borderline QTc at 495 ms on lowest dose tikosyn.   Additional studies reviewed include: Previous Echo 05/2015 (normal EF), Myoview 06/2015 normal EF, no ischemia, but several fixed defects.   Assessment and Plan:  1. Persistent atrial fibrillation Continue tikosyn 125 mcg BID Unfortunately his QTc is prolonged. Reviewed with Dr. Curt Bears. We will need to discuss alternative agents.  Previously failed flecainide ( and ? Component of CAD with fixed defects on previous myoview).  Suspect only other option would be amiodarone for patient with QTc prolongation. Will continue tikosyn now as he is in NSR. Labs pending. Will have scheduled with AF clinic to discuss switching to amiodarone. (He previously started, then refused after several days due to concerns for side effects) Continue Xarelto for CHA2DS2VASC of at least3   BMET/Mg today. Frequent PACs noted today.   2. HTN Continue coreg 25 mg BID Continue imdur 60 mg daily Continue benicard 40 mg daily  3. Chest pain with typical and atypical components Continue imdur 60 mg daily Previous myoview 06/2015 with no ischemia, but several fixed defects. Consider repeat if symptoms  Repeat Echo.   Shirley Friar, PA-C  09/19/19 11:06 AM

## 2019-09-19 ENCOUNTER — Other Ambulatory Visit: Payer: Self-pay

## 2019-09-19 ENCOUNTER — Ambulatory Visit: Payer: Medicare PPO | Admitting: Student

## 2019-09-19 ENCOUNTER — Telehealth (HOSPITAL_COMMUNITY): Payer: Self-pay | Admitting: Nurse Practitioner

## 2019-09-19 ENCOUNTER — Encounter: Payer: Self-pay | Admitting: Student

## 2019-09-19 VITALS — BP 148/86 | HR 55 | Ht 68.0 in | Wt 167.0 lb

## 2019-09-19 DIAGNOSIS — I10 Essential (primary) hypertension: Secondary | ICD-10-CM

## 2019-09-19 DIAGNOSIS — Z79899 Other long term (current) drug therapy: Secondary | ICD-10-CM

## 2019-09-19 DIAGNOSIS — R0600 Dyspnea, unspecified: Secondary | ICD-10-CM

## 2019-09-19 DIAGNOSIS — R079 Chest pain, unspecified: Secondary | ICD-10-CM

## 2019-09-19 DIAGNOSIS — I4819 Other persistent atrial fibrillation: Secondary | ICD-10-CM

## 2019-09-19 DIAGNOSIS — R0609 Other forms of dyspnea: Secondary | ICD-10-CM | POA: Insufficient documentation

## 2019-09-19 NOTE — Telephone Encounter (Signed)
Wife returned the call.  She is aware of appt 09/24/19 @10  am with Roderic Palau, NP.

## 2019-09-19 NOTE — Patient Instructions (Addendum)
Medication Instructions:  none *If you need a refill on your cardiac medications before your next appointment, please call your pharmacy*   Lab Work:  TODAY CMET CBC MAGNESIUM If you have labs (blood work) drawn today and your tests are completely normal, you will receive your results only by: Marland Kitchen MyChart Message (if you have MyChart) OR . A paper copy in the mail If you have any lab test that is abnormal or we need to change your treatment, we will call you to review the results.   Testing/Procedures:  PLEASE SCHEDULE Your physician has requested that you have an echocardiogram. Echocardiography is a painless test that uses sound waves to create images of your heart. It provides your doctor with information about the size and shape of your heart and how well your heart's chambers and valves are working. This procedure takes approximately one hour. There are no restrictions for this procedure.     Follow-Up: To Be Determined At [facility], you and your health needs are our priority.  As part of our continuing mission to provide you with exceptional heart care, we have created designated Provider Care Teams.  These Care Teams include your primary Cardiologist (physician) and Advanced Practice Providers (APPs -  Physician Assistants and Nurse Practitioners) who all work together to provide you with the care you need, when you need it.   Other Instructions

## 2019-09-19 NOTE — Telephone Encounter (Signed)
Called and left message for patient to call back.  Per message from [name], [place] pt needs appt in [facility].  Will offer pt appt for next wk with Roderic Palau, NP when he calls back.

## 2019-09-20 LAB — COMPREHENSIVE METABOLIC PANEL
ALT: 13 IU/L (ref 0–44)
AST: 12 IU/L (ref 0–40)
Albumin/Globulin Ratio: 1.6 (ref 1.2–2.2)
Albumin: 4.1 g/dL (ref 3.6–4.6)
Alkaline Phosphatase: 100 IU/L (ref 39–117)
BUN/Creatinine Ratio: 10 (ref 10–24)
BUN: 16 mg/dL (ref 8–27)
Bilirubin Total: 0.6 mg/dL (ref 0.0–1.2)
CO2: 24 mmol/L (ref 20–29)
Calcium: 9 mg/dL (ref 8.6–10.2)
Chloride: 106 mmol/L (ref 96–106)
Creatinine, Ser: 1.59 mg/dL — ABNORMAL HIGH (ref 0.76–1.27)
GFR calc Af Amer: 45 mL/min/{1.73_m2} — ABNORMAL LOW (ref >59)
GFR calc non Af Amer: 39 mL/min/{1.73_m2} — ABNORMAL LOW (ref >59)
Globulin, Total: 2.6 g/dL (ref 1.5–4.5)
Glucose: 85 mg/dL (ref 65–99)
Potassium: 4.1 mmol/L (ref 3.5–5.2)
Sodium: 144 mmol/L (ref 134–144)
Total Protein: 6.7 g/dL (ref 6.0–8.5)

## 2019-09-20 LAB — CBC
Hematocrit: 37 % — ABNORMAL LOW (ref 37.5–51.0)
Hemoglobin: 12.8 g/dL — ABNORMAL LOW (ref 13.0–17.7)
MCH: 30.9 pg (ref 26.6–33.0)
MCHC: 34.6 g/dL (ref 31.5–35.7)
MCV: 89 fL (ref 79–97)
Platelets: 261 10*3/uL (ref 150–450)
RBC: 4.14 x10E6/uL (ref 4.14–5.80)
RDW: 14.4 % (ref 11.6–15.4)
WBC: 10.6 10*3/uL (ref 3.4–10.8)

## 2019-09-20 LAB — MAGNESIUM: Magnesium: 2.3 mg/dL (ref 1.6–2.3)

## 2019-09-21 ENCOUNTER — Other Ambulatory Visit: Payer: Self-pay | Admitting: Cardiology

## 2019-09-24 ENCOUNTER — Encounter (HOSPITAL_COMMUNITY): Payer: Self-pay | Admitting: Nurse Practitioner

## 2019-09-24 ENCOUNTER — Ambulatory Visit (HOSPITAL_COMMUNITY)
Admission: RE | Admit: 2019-09-24 | Discharge: 2019-09-24 | Disposition: A | Discharge status: Home / Self Care | Payer: Medicare PPO | Source: Ambulatory Visit | Attending: Nurse Practitioner | Admitting: Nurse Practitioner

## 2019-09-24 ENCOUNTER — Other Ambulatory Visit: Payer: Self-pay

## 2019-09-24 VITALS — BP 150/82 | HR 51 | Ht 68.0 in | Wt 167.0 lb

## 2019-09-24 DIAGNOSIS — Z7901 Long term (current) use of anticoagulants: Secondary | ICD-10-CM | POA: Diagnosis not present

## 2019-09-24 DIAGNOSIS — Z79899 Other long term (current) drug therapy: Secondary | ICD-10-CM | POA: Insufficient documentation

## 2019-09-24 DIAGNOSIS — N183 Chronic kidney disease, stage 3 unspecified: Secondary | ICD-10-CM | POA: Diagnosis not present

## 2019-09-24 DIAGNOSIS — I1310 Hypertensive heart and chronic kidney disease without heart failure, with stage 1 through stage 4 chronic kidney disease, or unspecified chronic kidney disease: Secondary | ICD-10-CM | POA: Diagnosis not present

## 2019-09-24 DIAGNOSIS — D6869 Other thrombophilia: Secondary | ICD-10-CM

## 2019-09-24 DIAGNOSIS — I4819 Other persistent atrial fibrillation: Secondary | ICD-10-CM | POA: Diagnosis not present

## 2019-09-24 DIAGNOSIS — M109 Gout, unspecified: Secondary | ICD-10-CM | POA: Insufficient documentation

## 2019-09-24 NOTE — Progress Notes (Signed)
Patient ID: Austin [name], male   DOB: [birth year], 86 y.o.   MRN: [id number]     Primary Care Physician: Burnard Bunting, MD Referring Physician: [facility] f/u EP: Dr. Reggy Eye Austin [name] is a 86 y.o. male with a h/o persistent afib that failed flecainide, amiodarone and was admitted and loaded on tikosyn 3/27 thru 3/30. Renal function and electrolytes were followed during the hospitalization. QTc had prolongation and required down-titration of  Tiksoyn with improvement and stable QTc, his EKG reviewed by Dr. Curt Bears. He converted to sinus rhythm 08/12/15. He was monitored until discharge on telemetry which demonstrated SR.  F/u in afib clinic today, 09/24/19.He was recently seen by [name], PA at the [facility] office and he was in rhythm. QTc was 495 ms. There was a question if Tikosyn needed to be stopped for  qt concerns to amiodarone. He is on lowest dose at 125 mcg bid.  In the office here today, his qtc is 455 ms and he is in rhythm. He does not want change in therapy. He did try amiodarone briefly at one time, since he only has one eye, he was concerned that amiodarone would negatively effect his vision.  He continues on xarelto 15 mg daily for a CHA2DS2VASc of 3.   Today, he denies symptoms of palpitations, chest pain, shortness of breath, orthopnea, PND, lower extremity edema, dizziness, presyncope, syncope, or neurologic sequela. The patient is tolerating medications without difficulties and is otherwise without complaint today.   Past Medical History:  Diagnosis Date  . Allergy   . Asthma   . BPH (benign prostatic hypertrophy)   . CKD (chronic kidney disease) stage 3, GFR 30-59 ml/min   . Cough   . Gout   . Hypertensive chronic kidney disease   . Melanoma in situ of right eyelid ([place])   . OSA (obstructive sleep apnea)   . Persistent atrial fibrillation [facility])    Past Surgical History:  Procedure Laterality Date  . CARDIOVERSION [name] 06/05/2015   Procedure:  CARDIOVERSION;  Surgeon: Sanda Klein, MD;  Location: [facility] END[facility];  Service: Cardiovascular;  Laterality: [name];  . EYE SURGERY  12/2017    Current Outpatient Medications  Medication Sig Dispense Refill  . acetaminophen (TYLENOL) 500 MG tablet Take 1,000 mg by mouth daily as needed for mild pain or headache.    . albuterol (PROAIR HFA) 108 (90 Base) MCG/ACT inhaler Inhale 2 puffs into the lungs every 4 (four) hours as needed for wheezing or shortness of breath (cough).    . budesonide-formoterol (SYMBICORT) 160-4.5 MCG/ACT inhaler Inhale 2 puffs into the lungs 2 (two) times daily.    . butalbital-acetaminophen-caffeine (FIORICET, ESGIC) 50-325-40 MG tablet Take 1 tablet by mouth every 4 (four) hours as needed (severe headache). Maximum 4 tablets per day  5  . carvedilol (COREG) 25 MG tablet TAKE 1 TABLET BY MOUTH TWICE A DAY 180 tablet 3  . clobetasol cream (TEMOVATE) AB-123456789 % Apply 1 application topically daily as needed (for itching).     . dofetilide (TIKOSYN) 125 MCG capsule Take 1 capsule (125 mcg total) by mouth 2 (two) times daily. 180 capsule 3  . EPINEPHrine 0.15 MG/0.15ML IJ injection Inject 0.15 mg into the muscle as needed for anaphylaxis.    . fexofenadine (ALLEGRA ALLERGY) 60 MG tablet Take 60 mg by mouth every evening.     . fluticasone (FLONASE) 50 MCG/ACT nasal spray Place 2 sprays into both nostrils at bedtime.   8  . furosemide (  LASIX) 20 MG tablet Take 1 tablet (20 mg total) by mouth daily. 90 tablet 3  . gabapentin (NEURONTIN) 300 MG capsule Take 300 mg by mouth at bedtime.    . hydrOXYzine (ATARAX/VISTARIL) 25 MG tablet Take 37.5 mg by mouth at bedtime.    . indomethacin (INDOCIN) 50 MG capsule Take 50 mg by mouth as needed (for gout flare up).     . isosorbide mononitrate (IMDUR) 60 MG 24 hr tablet TAKE 1 TABLET BY MOUTH EVERY DAY 90 tablet 2  . KLOR-CON M20 20 MEQ tablet TAKE 1 TABLET BY MOUTH EVERY DAY 90 tablet 3  . MAGNESIUM CITRATE PO Take 250 mg by mouth at bedtime.     . moxifloxacin (VIGAMOX) 0.5 % ophthalmic solution Place 1 drop into the right eye in the morning and at bedtime.     Marland Kitchen neomycin-polymyxin b-dexamethasone (MAXITROL) 3.5-10000-0.1 OINT     . olmesartan (BENICAR) 40 MG tablet TAKE 1 TABLET BY MOUTH EVERY DAY. *REPLACES LOSARTAN 90 tablet 2  . Polyethyl Glycol-Propyl Glycol (SYSTANE PRESERVATIVE FREE) 0.4-0.3 % SOLN Place 1 drop into both eyes 2 (two) times daily.    [name] 15 MG TABS tablet TAKE 1 TABLET BY MOUTH EVERY DAY WITH SUPPER 90 tablet 1  . zolpidem (AMBIEN) 10 MG tablet Take 10 mg by mouth at bedtime as needed for sleep.   5   No current facility-administered medications for this encounter.    Allergies  Allergen Reactions  . Bee Venom Anaphylaxis  . Diltiazem Other (See Comments)    Severe headaches 120 mg BID  . Codeine Nausea And Vomiting  . Ivp Dye [Iodinated Diagnostic Agents] Rash  . Penicillins Swelling and Rash    Has patient had a PCN reaction causing immediate rash, facial/tongue/throat swelling, SOB or lightheadedness with hypotension: Yes Has patient had a PCN reaction causing severe rash involving mucus membranes or skin necrosis: No Has patient had a PCN reaction that required hospitalization No Has patient had a PCN reaction occurring within the last 10 years: No If all of the above answers are "NO", then may proceed with Cephalosporin use.  . Tape Itching and Rash    Redness.  Please use "paper" tape only    Social History   Socioeconomic History  . Marital status: Married    Spouse name: [name]  . Number of children: Not on file  . Years of education: Not on file  . Highest education level: Not on file  Occupational History  . Not on file  Tobacco Use  . Smoking status: Never Smoker  . Smokeless tobacco: Never Used  Substance and Sexual Activity  . Alcohol use: Yes    Alcohol/week: 1.0 standard drinks    Types: 1 Standard drinks or equivalent per week    Comment: occ  . Drug use: No  .  Sexual activity: Not on file  Other Topics Concern  . Not on file  Social History Narrative   Right Handed.  Caffiene  Coffee, tea 3 cups daily.   Pt retired.     Lives at home with wife.     Social Determinants of Health   Financial Resource Strain:   . Difficulty of Paying Living Expenses:   Food Insecurity:   . Worried About Charity fundraiser in the Last Year:   . Arboriculturist in the Last Year:   Transportation Needs:   . Film/video editor (Medical):   Marland Kitchen Lack of Transportation (Non-Medical):  Physical Activity:   . Days of Exercise per Week:   . Minutes of Exercise per Session:   Stress:   . Feeling of Stress :   Social Connections:   . Frequency of Communication with Friends and Family:   . Frequency of Social Gatherings with Friends and Family:   . Attends Religious Services:   . Active Member of Clubs or Organizations:   . Attends Archivist Meetings:   Marland Kitchen Marital Status:   Intimate Partner Violence:   . Fear of Current or Ex-Partner:   . Emotionally Abused:   Marland Kitchen Physically Abused:   . Sexually Abused:     Family History  Problem Relation Age of Onset  . Hypertension Father   . Lung cancer Father   . Hypertension Mother     ROS- All systems are reviewed and negative except as per the HPI above  Physical Exam: Vitals:   09/24/19 1025  BP: (!) 150/82  Pulse: (!) 51  Weight: 75.8 kg  Height: 5\' 8"  (1.727 m)    GEN- The patient is well appearing, alert and oriented x 3 today.   Head- normocephalic, atraumatic Eyes-  Sclera clear, conjunctiva pink. Blind rt eye Ears- hearing intact Oropharynx- clear Neck- supple, no JVP Lymph- no cervical lymphadenopathy Lungs- Clear to ausculation bilaterally, normal work of breathing Heart- Regular rate and rhythm, no murmurs, rubs or gallops, PMI not laterally displaced GI- soft, NT, ND, + BS Extremities- no clubbing, cyanosis, or edema MS- no significant deformity or atrophy Skin- no rash or  lesion Psych- euthymic mood, full affect Neuro- strength and sensation are intact  EKG- Sinus brady  at 51 bpm, LAD qrs int 98 ms, Qtc 455  ms Epic records reviewed  Assessment and Plan: 1. Persistent afib Has been maintaining for  SR for years on tikosyn  Qt borderline on last visit with [name], PA, now well within normal range at 455 ms  Continue Tikosyn 125 mg bid  2. CHA2DS2VASc score  of 3 Continue xarelto 15 mg qd   F/u here with repeat EKG in 2 weeks    Butch Penny C. Mirella Gueye, [facility] [address] [place], [place] [address] [phone]

## 2019-09-25 DIAGNOSIS — H30141 Acute posterior multifocal placoid pigment epitheliopathy, right eye: Secondary | ICD-10-CM | POA: Diagnosis not present

## 2019-09-25 DIAGNOSIS — H1830 Unspecified corneal membrane change: Secondary | ICD-10-CM | POA: Diagnosis not present

## 2019-09-25 DIAGNOSIS — Z947 Corneal transplant status: Secondary | ICD-10-CM | POA: Diagnosis not present

## 2019-10-09 ENCOUNTER — Ambulatory Visit (HOSPITAL_BASED_OUTPATIENT_CLINIC_OR_DEPARTMENT_OTHER): Payer: Medicare PPO

## 2019-10-09 ENCOUNTER — Other Ambulatory Visit: Payer: Self-pay

## 2019-10-09 ENCOUNTER — Ambulatory Visit (HOSPITAL_COMMUNITY)
Admission: RE | Admit: 2019-10-09 | Discharge: 2019-10-09 | Disposition: A | Discharge status: Home / Self Care | Payer: Medicare PPO | Source: Ambulatory Visit | Attending: Nurse Practitioner | Admitting: Nurse Practitioner

## 2019-10-09 VITALS — BP 140/72 | HR 52 | Ht 68.0 in | Wt 167.4 lb

## 2019-10-09 DIAGNOSIS — I4891 Unspecified atrial fibrillation: Secondary | ICD-10-CM | POA: Diagnosis not present

## 2019-10-09 DIAGNOSIS — R0600 Dyspnea, unspecified: Secondary | ICD-10-CM

## 2019-10-09 DIAGNOSIS — Z79899 Other long term (current) drug therapy: Secondary | ICD-10-CM

## 2019-10-09 DIAGNOSIS — R0609 Other forms of dyspnea: Secondary | ICD-10-CM

## 2019-10-09 DIAGNOSIS — N189 Chronic kidney disease, unspecified: Secondary | ICD-10-CM | POA: Insufficient documentation

## 2019-10-09 DIAGNOSIS — J45909 Unspecified asthma, uncomplicated: Secondary | ICD-10-CM | POA: Diagnosis not present

## 2019-10-09 DIAGNOSIS — G4733 Obstructive sleep apnea (adult) (pediatric): Secondary | ICD-10-CM | POA: Diagnosis not present

## 2019-10-09 DIAGNOSIS — I4819 Other persistent atrial fibrillation: Secondary | ICD-10-CM

## 2019-10-09 NOTE — Progress Notes (Signed)
Pt is here for findings of a long qt in device clinic of 495 ms. On f/u last week, qt was 455 ms so no changes were made with his tikosyn 125 mcg bid. He was asked to return  today and he is in sinus brady at 52 bpm, Pt int 189 ms, qrs int 104 ms, qtc 459 ms . Qt is stable so will continue tikosyn. F/u here in 3 months.

## 2019-10-13 ENCOUNTER — Other Ambulatory Visit: Payer: Self-pay | Admitting: Cardiology

## 2019-10-15 ENCOUNTER — Telehealth: Payer: Self-pay

## 2019-10-15 NOTE — Telephone Encounter (Signed)
-----   Message from Shirley Friar, PA-C sent at 10/11/2019  3:59 PM EDT ----- Please let him know that his ejection fraction is normal. The walls are thick from longstanding hypertension.   Thanks! Legrand Como [address]" [place], [place]  10/11/2019 3:59 PM

## 2019-10-15 NOTE — Telephone Encounter (Signed)
The patient has been notified of the Echo result and verbalized understanding.  All questions (if any) were answered. Frederik Schmidt, RN 10/15/2019 8:39 AM

## 2019-10-17 ENCOUNTER — Telehealth: Payer: Self-pay | Admitting: Cardiology

## 2019-10-17 NOTE — Telephone Encounter (Signed)
New Message  Pt c/o medication issue:  1. Name of Medication: carvedilol (COREG) 25 MG tablet  2. How are you currently taking this medication (dosage and times per day)? As written  3. Are you having a reaction (difficulty breathing--STAT)? No  4. What is your medication issue? Pt needs Dr. Curt Bears to fill out new prescription and call the pharmacy

## 2019-10-17 NOTE — Telephone Encounter (Signed)
I spoke to the patient's wife and she said that the patient will be out of Carvedilol on 6/6 and the insurance will not cover refill until 6/9.  He will monitor BP/HR over the 3 day period.

## 2019-10-18 NOTE — Telephone Encounter (Signed)
Spoke w/ wife. Made aware I spoke w/ pharmacy and they can stop by this afternoon to pick up tablet to cover pt till 6/9. Wife appreciative of my assistance.

## 2019-11-13 DIAGNOSIS — Z947 Corneal transplant status: Secondary | ICD-10-CM | POA: Diagnosis not present

## 2019-11-13 DIAGNOSIS — H30141 Acute posterior multifocal placoid pigment epitheliopathy, right eye: Secondary | ICD-10-CM | POA: Diagnosis not present

## 2019-11-13 DIAGNOSIS — Z8619 Personal history of other infectious and parasitic diseases: Secondary | ICD-10-CM | POA: Diagnosis not present

## 2019-11-13 DIAGNOSIS — H1830 Unspecified corneal membrane change: Secondary | ICD-10-CM | POA: Diagnosis not present

## 2019-11-15 DIAGNOSIS — Z8669 Personal history of other diseases of the nervous system and sense organs: Secondary | ICD-10-CM | POA: Diagnosis not present

## 2019-11-15 DIAGNOSIS — Z961 Presence of intraocular lens: Secondary | ICD-10-CM | POA: Diagnosis not present

## 2019-11-15 DIAGNOSIS — Z79899 Other long term (current) drug therapy: Secondary | ICD-10-CM | POA: Diagnosis not present

## 2019-11-15 DIAGNOSIS — H11131 Conjunctival pigmentations, right eye: Secondary | ICD-10-CM | POA: Diagnosis not present

## 2019-11-15 DIAGNOSIS — H30141 Acute posterior multifocal placoid pigment epitheliopathy, right eye: Secondary | ICD-10-CM | POA: Diagnosis not present

## 2019-11-15 DIAGNOSIS — Z947 Corneal transplant status: Secondary | ICD-10-CM | POA: Diagnosis not present

## 2019-11-15 DIAGNOSIS — H16001 Unspecified corneal ulcer, right eye: Secondary | ICD-10-CM | POA: Diagnosis not present

## 2019-11-15 DIAGNOSIS — Z8619 Personal history of other infectious and parasitic diseases: Secondary | ICD-10-CM | POA: Diagnosis not present

## 2019-11-15 DIAGNOSIS — H1830 Unspecified corneal membrane change: Secondary | ICD-10-CM | POA: Diagnosis not present

## 2019-11-22 DIAGNOSIS — H5710 Ocular pain, unspecified eye: Secondary | ICD-10-CM | POA: Diagnosis not present

## 2019-11-22 DIAGNOSIS — H5711 Ocular pain, right eye: Secondary | ICD-10-CM | POA: Diagnosis not present

## 2019-11-22 DIAGNOSIS — H5440 Blindness, one eye, unspecified eye: Secondary | ICD-10-CM | POA: Diagnosis not present

## 2019-11-22 DIAGNOSIS — T868411 Corneal transplant failure, right eye: Secondary | ICD-10-CM | POA: Diagnosis not present

## 2019-11-22 DIAGNOSIS — Z7901 Long term (current) use of anticoagulants: Secondary | ICD-10-CM | POA: Diagnosis not present

## 2019-11-22 DIAGNOSIS — Z947 Corneal transplant status: Secondary | ICD-10-CM | POA: Diagnosis not present

## 2019-12-02 ENCOUNTER — Telehealth: Payer: Self-pay | Admitting: Cardiology

## 2019-12-02 NOTE — Telephone Encounter (Signed)
Austin [name], wife of the patient called and asked to speak with Trinidad Curet. The wife wanted to know if the office got a surgical clearance from Dr. Benjamine Mola at [facility] in [place]

## 2019-12-02 NOTE — Telephone Encounter (Signed)
Informed that we have not received preop clearance request. Advised to have MD office refax. Wife agreeable to plan.

## 2019-12-03 NOTE — Telephone Encounter (Signed)
Patient's wife is calling to follow up in regards to clearance. She states she would like to make Dr. Macky Lower nurse aware that she followed up with [facility] and the fax was sent on 11/25/19. However, Dr. Benjamine Mola will be resubmitting the fax (attn: Dr. Curt Bears).

## 2019-12-03 NOTE — Telephone Encounter (Signed)
   Primary Cardiologist: Will Meredith Leeds, MD  Chart reviewed and patient contacted by phone today as part of pre-operative protocol coverage. Given past medical history and time since last visit, based on ACC/AHA guidelines, Austin [name] would be at acceptable risk for the planned procedure without further cardiovascular testing.   OK to hold Xarelto 3 days pre op.  I discussed this with the patient today.   I will route this recommendation to the requesting party via Epic fax function and remove from pre-op pool.  Please call with questions.  Kerin Ransom, PA-C 12/03/2019, 4:34 PM

## 2019-12-03 NOTE — Telephone Encounter (Signed)
° °  [facility] Pre-operative Risk Assessment    HEARTCARE STAFF: - Please ensure there is not already an duplicate clearance open for this procedure. - Under Visit Info/Reason for Call, type in Other and utilize the format Clearance MM/DD/YY or Clearance TBD. Do not use dashes or single digits. - If request is for dental extraction, please clarify the # of teeth to be extracted.  Request for surgical clearance: OFFICE NOTES FROM REQUESTING OFFICE ARE IN EPIC; SEE 11/22/19 OV NOTES FROM DR. RICE  1. What type of surgery is being performed? ENUCLEATION RIGHT EYE WITH IMPLANT   2. When is this surgery scheduled? TBD   3. What type of clearance is required (medical clearance vs. Pharmacy clearance to hold med vs. Both)? BOTH  4. Are there any medications that need to be held prior to surgery and how long? XARELTO TO BE HELD 4-5 DAYS PRIOR TO PROCEDURE   5. Practice name and name of physician performing surgery? [facility] OPHTHALMOLOGY DEPT.; DR. Juanda Crumble DAVID RICE  6. What is the office phone number? [phone]   7.   What is the office fax number? [phone]  8.   Anesthesia type (None, local, MAC, general) ? GENERAL    Austin [name] 12/03/2019, 11:22 AM  _________________________________________________________________   (provider comments below)

## 2019-12-03 NOTE — Telephone Encounter (Signed)
Patient with diagnosis of afib on Xarelto for anticoagulation.    Procedure: ENUCLEATION RIGHT EYE WITH IMPLANT  Date of procedure: TBD  CHADS2-VASc score of 3 (age x2, HTN).  CrCl 60mL/min, pt on appropriately reduced dose of Xarelto 15mg  daily Platelet count 261K  Should not need 4-5 day Xarelto hold since his Xarelto is being dose adjusted for renal function. Would recommend holding Xarelto for 3 days prior to eye implant.

## 2019-12-04 DIAGNOSIS — H16001 Unspecified corneal ulcer, right eye: Secondary | ICD-10-CM | POA: Diagnosis not present

## 2019-12-04 DIAGNOSIS — Z947 Corneal transplant status: Secondary | ICD-10-CM | POA: Diagnosis not present

## 2019-12-12 DIAGNOSIS — G4733 Obstructive sleep apnea (adult) (pediatric): Secondary | ICD-10-CM | POA: Diagnosis not present

## 2019-12-13 DIAGNOSIS — L820 Inflamed seborrheic keratosis: Secondary | ICD-10-CM | POA: Diagnosis not present

## 2019-12-13 DIAGNOSIS — L821 Other seborrheic keratosis: Secondary | ICD-10-CM | POA: Diagnosis not present

## 2019-12-13 DIAGNOSIS — L570 Actinic keratosis: Secondary | ICD-10-CM | POA: Diagnosis not present

## 2019-12-13 DIAGNOSIS — L814 Other melanin hyperpigmentation: Secondary | ICD-10-CM | POA: Diagnosis not present

## 2019-12-13 DIAGNOSIS — C44629 Squamous cell carcinoma of skin of left upper limb, including shoulder: Secondary | ICD-10-CM | POA: Diagnosis not present

## 2019-12-13 DIAGNOSIS — D0461 Carcinoma in situ of skin of right upper limb, including shoulder: Secondary | ICD-10-CM | POA: Diagnosis not present

## 2019-12-13 DIAGNOSIS — D225 Melanocytic nevi of trunk: Secondary | ICD-10-CM | POA: Diagnosis not present

## 2019-12-13 DIAGNOSIS — Z85828 Personal history of other malignant neoplasm of skin: Secondary | ICD-10-CM | POA: Diagnosis not present

## 2019-12-13 DIAGNOSIS — D0462 Carcinoma in situ of skin of left upper limb, including shoulder: Secondary | ICD-10-CM | POA: Diagnosis not present

## 2019-12-29 ENCOUNTER — Other Ambulatory Visit (HOSPITAL_COMMUNITY): Payer: Self-pay | Admitting: Cardiology

## 2019-12-30 NOTE — Telephone Encounter (Signed)
Last OV 09/19/19 Scr 1.59 on 09/19/19 ABW crc; 35 Xarelto 15

## 2020-01-08 ENCOUNTER — Ambulatory Visit (HOSPITAL_COMMUNITY)
Admission: RE | Admit: 2020-01-08 | Discharge: 2020-01-08 | Disposition: A | Discharge status: Home / Self Care | Payer: Medicare PPO | Source: Ambulatory Visit | Attending: Nurse Practitioner | Admitting: Nurse Practitioner

## 2020-01-08 ENCOUNTER — Encounter (HOSPITAL_COMMUNITY): Payer: Self-pay | Admitting: Nurse Practitioner

## 2020-01-08 ENCOUNTER — Other Ambulatory Visit: Payer: Self-pay

## 2020-01-08 VITALS — BP 142/88 | HR 54 | Ht 68.0 in | Wt 171.0 lb

## 2020-01-08 DIAGNOSIS — I129 Hypertensive chronic kidney disease with stage 1 through stage 4 chronic kidney disease, or unspecified chronic kidney disease: Secondary | ICD-10-CM | POA: Insufficient documentation

## 2020-01-08 DIAGNOSIS — Z880 Allergy status to penicillin: Secondary | ICD-10-CM | POA: Insufficient documentation

## 2020-01-08 DIAGNOSIS — D6869 Other thrombophilia: Secondary | ICD-10-CM | POA: Diagnosis not present

## 2020-01-08 DIAGNOSIS — M109 Gout, unspecified: Secondary | ICD-10-CM | POA: Insufficient documentation

## 2020-01-08 DIAGNOSIS — H5461 Unqualified visual loss, right eye, normal vision left eye: Secondary | ICD-10-CM | POA: Insufficient documentation

## 2020-01-08 DIAGNOSIS — Z7951 Long term (current) use of inhaled steroids: Secondary | ICD-10-CM | POA: Insufficient documentation

## 2020-01-08 DIAGNOSIS — N400 Enlarged prostate without lower urinary tract symptoms: Secondary | ICD-10-CM | POA: Diagnosis not present

## 2020-01-08 DIAGNOSIS — Z85820 Personal history of malignant melanoma of skin: Secondary | ICD-10-CM | POA: Diagnosis not present

## 2020-01-08 DIAGNOSIS — G4733 Obstructive sleep apnea (adult) (pediatric): Secondary | ICD-10-CM | POA: Insufficient documentation

## 2020-01-08 DIAGNOSIS — I4819 Other persistent atrial fibrillation: Secondary | ICD-10-CM | POA: Insufficient documentation

## 2020-01-08 DIAGNOSIS — Z79899 Other long term (current) drug therapy: Secondary | ICD-10-CM | POA: Insufficient documentation

## 2020-01-08 DIAGNOSIS — Z885 Allergy status to narcotic agent status: Secondary | ICD-10-CM | POA: Diagnosis not present

## 2020-01-08 DIAGNOSIS — N183 Chronic kidney disease, stage 3 unspecified: Secondary | ICD-10-CM | POA: Diagnosis not present

## 2020-01-08 DIAGNOSIS — Z7901 Long term (current) use of anticoagulants: Secondary | ICD-10-CM | POA: Diagnosis not present

## 2020-01-08 DIAGNOSIS — Z8249 Family history of ischemic heart disease and other diseases of the circulatory system: Secondary | ICD-10-CM | POA: Insufficient documentation

## 2020-01-08 DIAGNOSIS — J45909 Unspecified asthma, uncomplicated: Secondary | ICD-10-CM | POA: Insufficient documentation

## 2020-01-08 LAB — BASIC METABOLIC PANEL
Anion gap: 8 (ref 5–15)
BUN: 15 mg/dL (ref 8–23)
CO2: 28 mmol/L (ref 22–32)
Calcium: 9 mg/dL (ref 8.9–10.3)
Chloride: 106 mmol/L (ref 98–111)
Creatinine, Ser: 1.55 mg/dL — ABNORMAL HIGH (ref 0.61–1.24)
GFR calc Af Amer: 46 mL/min — ABNORMAL LOW (ref >60)
GFR calc non Af Amer: 40 mL/min — ABNORMAL LOW (ref >60)
Glucose, Bld: 109 mg/dL — ABNORMAL HIGH (ref 70–99)
Potassium: 4.9 mmol/L (ref 3.5–5.1)
Sodium: 142 mmol/L (ref 135–145)

## 2020-01-08 LAB — MAGNESIUM: Magnesium: 2.2 mg/dL (ref 1.7–2.4)

## 2020-01-08 NOTE — Progress Notes (Signed)
Patient ID: Austin [name], male   DOB: Oct 10, 1930, 86 y.o.   MRN: [id number]     Primary Care Physician: Burnard Bunting, MD Referring Physician: [facility] f/u EP: Dr. Reggy Eye Austin [name] is a 86 y.o. male with a h/o persistent afib that failed flecainide, amiodarone and was admitted and loaded on tikosyn 3/27 thru 3/30. Renal function and electrolytes were followed during the hospitalization. QTc had prolongation and required down-titration of  Tiksoyn with improvement and stable QTc, his EKG reviewed by Dr. Curt Bears. He converted to sinus rhythm 08/12/15. He was monitored until discharge on telemetry which demonstrated SR.  F/u in afib clinic today, 09/24/19.He was recently seen by [name], PA at the [facility] office and he was in rhythm. QTc was 495 ms. There was a question if Tikosyn needed to be stopped for  qt concerns and changed  to amiodarone. He is on lowest dose of tikosyn at 125 mcg bid.  In the office here today, his qtc is 455 ms and he is in rhythm. He does not want change in therapy. He did try amiodarone briefly at one time, since he only has one eye, he was concerned that amiodarone would negatively effect his vision.  He continues on xarelto 15 mg daily for a CHA2DS2VASc of 3.   F/u in afib clinic, 8/25,he continues on Tikosyn and is maintaining SR. He is pending having his rt eye removed for ongoing pain/ha's with h/o of melanoma/shingles and  infection in the eye. He was advised to hold xarelto x 3 days. Qtc is stable.   Today, he denies symptoms of palpitations, chest pain, shortness of breath, orthopnea, PND, lower extremity edema, dizziness, presyncope, syncope, or neurologic sequela. The patient is tolerating medications without difficulties and is otherwise without complaint today.   Past Medical History:  Diagnosis Date  . Allergy   . Asthma   . BPH (benign prostatic hypertrophy)   . CKD (chronic kidney disease) stage 3, GFR 30-59 ml/min   . Cough   .  Gout   . Hypertensive chronic kidney disease   . Melanoma in situ of right eyelid ([place])   . OSA (obstructive sleep apnea)   . Persistent atrial fibrillation [facility])    Past Surgical History:  Procedure Laterality Date  . CARDIOVERSION [name] 06/05/2015   Procedure: CARDIOVERSION;  Surgeon: Sanda Klein, MD;  Location: [facility]OS[facility];  Service: Cardiovascular;  Laterality: [name];  . EYE SURGERY  12/2017    Current Outpatient Medications  Medication Sig Dispense Refill  . acetaminophen (TYLENOL) 500 MG tablet Take 1,000 mg by mouth daily as needed for mild pain or headache.    . albuterol (PROAIR HFA) 108 (90 Base) MCG/ACT inhaler Inhale 2 puffs into the lungs every 4 (four) hours as needed for wheezing or shortness of breath (cough).    . benzonatate (TESSALON) 100 MG capsule as needed.     . budesonide-formoterol (SYMBICORT) 80-4.5 MCG/ACT inhaler Inhale 2 puffs into the lungs 2 (two) times daily.     . butalbital-acetaminophen-caffeine (FIORICET, ESGIC) 50-325-40 MG tablet Take 1 tablet by mouth every 4 (four) hours as needed (severe headache). Maximum 4 tablets per day  5  . carvedilol (COREG) 25 MG tablet TAKE 1 TABLET BY MOUTH TWICE A DAY 180 tablet 3  . clobetasol cream (TEMOVATE) 2.22 % Apply 1 application topically daily as needed (for itching).     . dofetilide (TIKOSYN) 125 MCG capsule TAKE 1 CAPSULE (125 MCG TOTAL) BY MOUTH 2 (  TWO) TIMES DAILY. 180 capsule 3  . EPINEPHrine 0.15 MG/0.15ML IJ injection Inject 0.15 mg into the muscle as needed for anaphylaxis.    . fexofenadine (ALLEGRA ALLERGY) 60 MG tablet Take 60 mg by mouth every evening.     . fluticasone (FLONASE) 50 MCG/ACT nasal spray Place 2 sprays into both nostrils at bedtime.   8  . furosemide (LASIX) 20 MG tablet Take 1 tablet (20 mg total) by mouth daily. 90 tablet 3  . gabapentin (NEURONTIN) 300 MG capsule Take 300 mg by mouth 2 (two) times daily.     . hydrOXYzine (ATARAX/VISTARIL) 25 MG tablet Take 37.5 mg by mouth at  bedtime.    . indomethacin (INDOCIN) 50 MG capsule Take 50 mg by mouth as needed (for gout flare up).     . isosorbide mononitrate (IMDUR) 60 MG 24 hr tablet TAKE 1 TABLET BY MOUTH EVERY DAY 90 tablet 2  . KLOR-CON M20 20 MEQ tablet TAKE 1 TABLET BY MOUTH EVERY DAY 90 tablet 3  . MAGNESIUM CITRATE PO Take 250 mg by mouth at bedtime.    Marland Kitchen olmesartan (BENICAR) 40 MG tablet TAKE 1 TABLET BY MOUTH EVERY DAY. *REPLACES LOSARTAN 90 tablet 2  . Polyethyl Glycol-Propyl Glycol (SYSTANE PRESERVATIVE FREE) 0.4-0.3 % SOLN Place 1 drop into both eyes 2 (two) times daily.    . Probiotic Product (CULTRELLE KIDS IMMUNE DEFENSE PO) Take by mouth in the morning and at bedtime.    . triamcinolone (NASACORT ALLERGY 24HR) 55 MCG/ACT AERO nasal inhaler Place 2 sprays into the nose 2 (two) times daily.    . valACYclovir (VALTREX) 1000 MG tablet Take 1,000 mg by mouth daily.    [name] 15 MG TABS tablet TAKE 1 TABLET BY MOUTH EVERY DAY WITH SUPPER 90 tablet 1  . zolpidem (AMBIEN) 10 MG tablet Take 10 mg by mouth at bedtime as needed for sleep.   5   No current facility-administered medications for this encounter.    Allergies  Allergen Reactions  . Bee Venom Anaphylaxis  . Diltiazem Other (See Comments)    Severe headaches 120 mg BID  . Codeine Nausea And Vomiting  . Ivp Dye [Iodinated Diagnostic Agents] Rash  . Penicillins Swelling and Rash    Has patient had a PCN reaction causing immediate rash, facial/tongue/throat swelling, SOB or lightheadedness with hypotension: Yes Has patient had a PCN reaction causing severe rash involving mucus membranes or skin necrosis: No Has patient had a PCN reaction that required hospitalization No Has patient had a PCN reaction occurring within the last 10 years: No If all of the above answers are "NO", then may proceed with Cephalosporin use.  . Tape Itching and Rash    Redness.  Please use "paper" tape only    Social History   Socioeconomic History  . Marital  status: Married    Spouse name: [name]  . Number of children: Not on file  . Years of education: Not on file  . Highest education level: Not on file  Occupational History  . Not on file  Tobacco Use  . Smoking status: Never Smoker  . Smokeless tobacco: Never Used  Substance and Sexual Activity  . Alcohol use: Yes    Alcohol/week: 1.0 standard drink    Types: 1 Standard drinks or equivalent per week    Comment: occ  . Drug use: No  . Sexual activity: Not on file  Other Topics Concern  . Not on file  Social History Narrative  Right Handed.  Caffiene  Coffee, tea 3 cups daily.   Pt retired.     Lives at home with wife.     Social Determinants of Health   Financial Resource Strain:   . Difficulty of Paying Living Expenses: Not on file  Food Insecurity:   . Worried About Charity fundraiser in the Last Year: Not on file  . Ran Out of Food in the Last Year: Not on file  Transportation Needs:   . Lack of Transportation (Medical): Not on file  . Lack of Transportation (Non-Medical): Not on file  Physical Activity:   . Days of Exercise per Week: Not on file  . Minutes of Exercise per Session: Not on file  Stress:   . Feeling of Stress : Not on file  Social Connections:   . Frequency of Communication with Friends and Family: Not on file  . Frequency of Social Gatherings with Friends and Family: Not on file  . Attends Religious Services: Not on file  . Active Member of Clubs or Organizations: Not on file  . Attends Archivist Meetings: Not on file  . Marital Status: Not on file  Intimate Partner Violence:   . Fear of Current or Ex-Partner: Not on file  . Emotionally Abused: Not on file  . Physically Abused: Not on file  . Sexually Abused: Not on file    Family History  Problem Relation Age of Onset  . Hypertension Father   . Lung cancer Father   . Hypertension Mother     ROS- All systems are reviewed and negative except as per the HPI above  Physical  Exam: Vitals:   01/08/20 0942  BP: (!) 142/88  Pulse: (!) 54  Weight: 77.6 kg  Height: 5\' 8"  (1.727 m)    GEN- The patient is well appearing, alert and oriented x 3 today.   Head- normocephalic, atraumatic Eyes-  Sclera clear, conjunctiva pink. Blind rt eye Ears- hearing intact Oropharynx- clear Neck- supple, no JVP Lymph- no cervical lymphadenopathy Lungs- Clear to ausculation bilaterally, normal work of breathing Heart- Regular rate and rhythm, no murmurs, rubs or gallops, PMI not laterally displaced GI- soft, NT, ND, + BS Extremities- no clubbing, cyanosis, or edema MS- no significant deformity or atrophy Skin- no rash or lesion Psych- euthymic mood, full affect Neuro- strength and sensation are intact  EKG- sinus brady at 54 bpm, pr int 202 ms, qrs int 98 ms, 474 ms (stable)  Epic records reviewed  Assessment and Plan: 1. Persistent afib Has been maintaining for  SR for years on tikosyn  Qt  within normal range at 474 ms  Continue Tikosyn 125 mg bid bmet/mag  2. CHA2DS2VASc score  of 3 Continue xarelto 15 mg qd He is aware to stop anticoagulation x 3 days prior to eye surgery , per [facility] note   F/u here in 3 months    Butch Penny C. Marabella Popiel, [facility] [address] [place], [place] [address] [phone]

## 2020-01-21 ENCOUNTER — Other Ambulatory Visit: Payer: Self-pay | Admitting: Cardiology

## 2020-01-29 DIAGNOSIS — I129 Hypertensive chronic kidney disease with stage 1 through stage 4 chronic kidney disease, or unspecified chronic kidney disease: Secondary | ICD-10-CM | POA: Diagnosis not present

## 2020-01-29 DIAGNOSIS — H540X55 Blindness right eye category 5, blindness left eye category 5: Secondary | ICD-10-CM | POA: Diagnosis not present

## 2020-01-29 DIAGNOSIS — G4733 Obstructive sleep apnea (adult) (pediatric): Secondary | ICD-10-CM | POA: Diagnosis not present

## 2020-01-29 DIAGNOSIS — H5710 Ocular pain, unspecified eye: Secondary | ICD-10-CM | POA: Diagnosis not present

## 2020-01-29 DIAGNOSIS — H5711 Ocular pain, right eye: Secondary | ICD-10-CM | POA: Diagnosis not present

## 2020-01-29 DIAGNOSIS — J45909 Unspecified asthma, uncomplicated: Secondary | ICD-10-CM | POA: Diagnosis not present

## 2020-01-29 DIAGNOSIS — F419 Anxiety disorder, unspecified: Secondary | ICD-10-CM | POA: Diagnosis not present

## 2020-01-29 DIAGNOSIS — I4891 Unspecified atrial fibrillation: Secondary | ICD-10-CM | POA: Diagnosis not present

## 2020-01-29 DIAGNOSIS — H5440 Blindness, one eye, unspecified eye: Secondary | ICD-10-CM | POA: Diagnosis not present

## 2020-01-29 DIAGNOSIS — D631 Anemia in chronic kidney disease: Secondary | ICD-10-CM | POA: Diagnosis not present

## 2020-01-29 DIAGNOSIS — C6901 Malignant neoplasm of right conjunctiva: Secondary | ICD-10-CM | POA: Diagnosis not present

## 2020-01-29 DIAGNOSIS — H11131 Conjunctival pigmentations, right eye: Secondary | ICD-10-CM | POA: Diagnosis not present

## 2020-01-29 DIAGNOSIS — N189 Chronic kidney disease, unspecified: Secondary | ICD-10-CM | POA: Diagnosis not present

## 2020-01-29 DIAGNOSIS — C6991 Malignant neoplasm of unspecified site of right eye: Secondary | ICD-10-CM | POA: Diagnosis not present

## 2020-01-30 DIAGNOSIS — H5440 Blindness, one eye, unspecified eye: Secondary | ICD-10-CM | POA: Diagnosis not present

## 2020-01-30 DIAGNOSIS — Z9889 Other specified postprocedural states: Secondary | ICD-10-CM | POA: Diagnosis not present

## 2020-01-30 DIAGNOSIS — H5710 Ocular pain, unspecified eye: Secondary | ICD-10-CM | POA: Diagnosis not present

## 2020-02-04 ENCOUNTER — Other Ambulatory Visit: Payer: Self-pay

## 2020-02-04 ENCOUNTER — Emergency Department (HOSPITAL_COMMUNITY): Payer: Medicare PPO

## 2020-02-04 ENCOUNTER — Telehealth: Payer: Self-pay | Admitting: Cardiology

## 2020-02-04 ENCOUNTER — Encounter (HOSPITAL_COMMUNITY): Payer: Self-pay | Admitting: Emergency Medicine

## 2020-02-04 ENCOUNTER — Emergency Department (HOSPITAL_COMMUNITY)
Admission: EM | Admit: 2020-02-04 | Discharge: 2020-02-04 | Disposition: A | Discharge status: Home / Self Care | Payer: Medicare PPO | Attending: Emergency Medicine | Admitting: Emergency Medicine

## 2020-02-04 DIAGNOSIS — R52 Pain, unspecified: Secondary | ICD-10-CM | POA: Diagnosis not present

## 2020-02-04 DIAGNOSIS — G4489 Other headache syndrome: Secondary | ICD-10-CM | POA: Diagnosis not present

## 2020-02-04 DIAGNOSIS — R519 Headache, unspecified: Secondary | ICD-10-CM | POA: Diagnosis not present

## 2020-02-04 DIAGNOSIS — Z743 Need for continuous supervision: Secondary | ICD-10-CM | POA: Diagnosis not present

## 2020-02-04 DIAGNOSIS — R110 Nausea: Secondary | ICD-10-CM | POA: Insufficient documentation

## 2020-02-04 DIAGNOSIS — I10 Essential (primary) hypertension: Secondary | ICD-10-CM | POA: Insufficient documentation

## 2020-02-04 DIAGNOSIS — R6889 Other general symptoms and signs: Secondary | ICD-10-CM | POA: Diagnosis not present

## 2020-02-04 DIAGNOSIS — Z5321 Procedure and treatment not carried out due to patient leaving prior to being seen by health care provider: Secondary | ICD-10-CM | POA: Diagnosis not present

## 2020-02-04 DIAGNOSIS — R29810 Facial weakness: Secondary | ICD-10-CM | POA: Diagnosis not present

## 2020-02-04 LAB — URINALYSIS, ROUTINE W REFLEX MICROSCOPIC
Bilirubin Urine: NEGATIVE
Glucose, UA: NEGATIVE mg/dL
Hgb urine dipstick: NEGATIVE
Ketones, ur: 5 mg/dL — AB
Leukocytes,Ua: NEGATIVE
Nitrite: NEGATIVE
Protein, ur: NEGATIVE mg/dL
Specific Gravity, Urine: 1.009 (ref 1.005–1.030)
pH: 7 (ref 5.0–8.0)

## 2020-02-04 LAB — COMPREHENSIVE METABOLIC PANEL
ALT: 16 U/L (ref 0–44)
AST: 18 U/L (ref 15–41)
Albumin: 3.7 g/dL (ref 3.5–5.0)
Alkaline Phosphatase: 75 U/L (ref 38–126)
Anion gap: 10 (ref 5–15)
BUN: 17 mg/dL (ref 8–23)
CO2: 24 mmol/L (ref 22–32)
Calcium: 8.7 mg/dL — ABNORMAL LOW (ref 8.9–10.3)
Chloride: 104 mmol/L (ref 98–111)
Creatinine, Ser: 1.45 mg/dL — ABNORMAL HIGH (ref 0.61–1.24)
GFR calc Af Amer: 50 mL/min — ABNORMAL LOW (ref >60)
GFR calc non Af Amer: 43 mL/min — ABNORMAL LOW (ref >60)
Glucose, Bld: 136 mg/dL — ABNORMAL HIGH (ref 70–99)
Potassium: 3.9 mmol/L (ref 3.5–5.1)
Sodium: 138 mmol/L (ref 135–145)
Total Bilirubin: 0.7 mg/dL (ref 0.3–1.2)
Total Protein: 6.4 g/dL — ABNORMAL LOW (ref 6.5–8.1)

## 2020-02-04 LAB — CBC WITH DIFFERENTIAL/PLATELET
Abs Immature Granulocytes: 0.11 10*3/uL — ABNORMAL HIGH (ref 0.00–0.07)
Basophils Absolute: 0 10*3/uL (ref 0.0–0.1)
Basophils Relative: 0 %
Eosinophils Absolute: 0.6 10*3/uL — ABNORMAL HIGH (ref 0.0–0.5)
Eosinophils Relative: 4 %
HCT: 40.6 % (ref 39.0–52.0)
Hemoglobin: 12.8 g/dL — ABNORMAL LOW (ref 13.0–17.0)
Immature Granulocytes: 1 %
Lymphocytes Relative: 19 %
Lymphs Abs: 2.6 10*3/uL (ref 0.7–4.0)
MCH: 28.3 pg (ref 26.0–34.0)
MCHC: 31.5 g/dL (ref 30.0–36.0)
MCV: 89.8 fL (ref 80.0–100.0)
Monocytes Absolute: 1.2 10*3/uL — ABNORMAL HIGH (ref 0.1–1.0)
Monocytes Relative: 9 %
Neutro Abs: 9 10*3/uL — ABNORMAL HIGH (ref 1.7–7.7)
Neutrophils Relative %: 67 %
Platelets: 336 10*3/uL (ref 150–400)
RBC: 4.52 MIL/uL (ref 4.22–5.81)
RDW: 15.5 % (ref 11.5–15.5)
WBC: 13.5 10*3/uL — ABNORMAL HIGH (ref 4.0–10.5)
nRBC: 0 % (ref 0.0–0.2)

## 2020-02-04 NOTE — ED Triage Notes (Signed)
Patient had right eye removed at [facility] one week ago.  Patient is hypertensive with EMS 240/120, HR 60.  Patient does have a headache at this time.  Patient did have nausea upon EMS arrival.  Patient did take APAP with no relief of pain.

## 2020-02-04 NOTE — Telephone Encounter (Signed)
Patient's wife called with updated BP: she said it was 177/91 after visit this eye doctor.  She stated he said the pain is causing the elevated blood pressure.

## 2020-02-04 NOTE — Telephone Encounter (Signed)
New Message   Patients wife is calling to inform Dr Curt Bears that she is taking her husband to [facility] to the eye clinic because the swelling in his eye is causing his BP to go up. He just had his eye enucleated. She states the BP this morning was 201/91 She took him to [facility] this morning the wait time was so long and he was in so much pain that he asked to go home.

## 2020-02-04 NOTE — Telephone Encounter (Addendum)
Spoke with patient's wife.  They just got to the [facility] which is part of [place].  She is taking patient via w/c to an appointment there.    They went to [facility] ER and he waited over 4.5 hours in the waiting room.  He had the worst headache and was vomiting and BP extremely elevated.  He had labs and head CT there.  Took am bp meds with him, took them on the way home but then he vomited.   Did end up taking them again at 11:00 am and has kept them down.  Most recent BP 222/98.  She is aware I am forwarding to Dr. Curt Bears to make aware and asked her to call back after this visit with update/plan/bp.

## 2020-02-05 DIAGNOSIS — M109 Gout, unspecified: Secondary | ICD-10-CM | POA: Diagnosis not present

## 2020-02-05 DIAGNOSIS — I129 Hypertensive chronic kidney disease with stage 1 through stage 4 chronic kidney disease, or unspecified chronic kidney disease: Secondary | ICD-10-CM | POA: Diagnosis not present

## 2020-02-05 DIAGNOSIS — Z125 Encounter for screening for malignant neoplasm of prostate: Secondary | ICD-10-CM | POA: Diagnosis not present

## 2020-02-05 NOTE — Telephone Encounter (Signed)
Spoke to wife. Pt doing much better today, BP back to normal. They will continue monitoring, BP issues r/t post procedure eye pain pt was having. She appreciates my follow up and will let us know if something changes.

## 2020-02-11 DIAGNOSIS — Z79899 Other long term (current) drug therapy: Secondary | ICD-10-CM | POA: Diagnosis not present

## 2020-02-11 DIAGNOSIS — Z9889 Other specified postprocedural states: Secondary | ICD-10-CM | POA: Diagnosis not present

## 2020-02-11 DIAGNOSIS — C6901 Malignant neoplasm of right conjunctiva: Secondary | ICD-10-CM | POA: Diagnosis not present

## 2020-02-11 DIAGNOSIS — Z4421 Encounter for fitting and adjustment of artificial right eye: Secondary | ICD-10-CM | POA: Diagnosis not present

## 2020-02-12 DIAGNOSIS — Z0000 Encounter for general adult medical examination without abnormal findings: Secondary | ICD-10-CM | POA: Diagnosis not present

## 2020-02-12 DIAGNOSIS — I10 Essential (primary) hypertension: Secondary | ICD-10-CM | POA: Diagnosis not present

## 2020-02-12 DIAGNOSIS — Z23 Encounter for immunization: Secondary | ICD-10-CM | POA: Diagnosis not present

## 2020-02-12 DIAGNOSIS — F5101 Primary insomnia: Secondary | ICD-10-CM | POA: Diagnosis not present

## 2020-02-12 DIAGNOSIS — J449 Chronic obstructive pulmonary disease, unspecified: Secondary | ICD-10-CM | POA: Diagnosis not present

## 2020-02-12 DIAGNOSIS — R82998 Other abnormal findings in urine: Secondary | ICD-10-CM | POA: Diagnosis not present

## 2020-02-12 DIAGNOSIS — C4339 Malignant melanoma of other parts of face: Secondary | ICD-10-CM | POA: Diagnosis not present

## 2020-02-12 DIAGNOSIS — I129 Hypertensive chronic kidney disease with stage 1 through stage 4 chronic kidney disease, or unspecified chronic kidney disease: Secondary | ICD-10-CM | POA: Diagnosis not present

## 2020-02-12 DIAGNOSIS — M1990 Unspecified osteoarthritis, unspecified site: Secondary | ICD-10-CM | POA: Diagnosis not present

## 2020-02-12 DIAGNOSIS — Z7901 Long term (current) use of anticoagulants: Secondary | ICD-10-CM | POA: Diagnosis not present

## 2020-03-03 DIAGNOSIS — Z8619 Personal history of other infectious and parasitic diseases: Secondary | ICD-10-CM | POA: Diagnosis not present

## 2020-03-03 DIAGNOSIS — Z7901 Long term (current) use of anticoagulants: Secondary | ICD-10-CM | POA: Diagnosis not present

## 2020-03-03 DIAGNOSIS — Z961 Presence of intraocular lens: Secondary | ICD-10-CM | POA: Diagnosis not present

## 2020-03-03 DIAGNOSIS — C6901 Malignant neoplasm of right conjunctiva: Secondary | ICD-10-CM | POA: Diagnosis not present

## 2020-03-03 DIAGNOSIS — S80811A Abrasion, right lower leg, initial encounter: Secondary | ICD-10-CM | POA: Diagnosis not present

## 2020-03-03 DIAGNOSIS — Z85828 Personal history of other malignant neoplasm of skin: Secondary | ICD-10-CM | POA: Diagnosis not present

## 2020-03-03 DIAGNOSIS — Z9889 Other specified postprocedural states: Secondary | ICD-10-CM | POA: Diagnosis not present

## 2020-03-20 DIAGNOSIS — J9811 Atelectasis: Secondary | ICD-10-CM | POA: Diagnosis not present

## 2020-03-20 DIAGNOSIS — C6901 Malignant neoplasm of right conjunctiva: Secondary | ICD-10-CM | POA: Diagnosis not present

## 2020-03-24 DIAGNOSIS — C6901 Malignant neoplasm of right conjunctiva: Secondary | ICD-10-CM | POA: Diagnosis not present

## 2020-03-24 DIAGNOSIS — Z86006 Personal history of melanoma in-situ: Secondary | ICD-10-CM | POA: Diagnosis not present

## 2020-03-24 DIAGNOSIS — Z08 Encounter for follow-up examination after completed treatment for malignant neoplasm: Secondary | ICD-10-CM | POA: Diagnosis not present

## 2020-03-24 DIAGNOSIS — Z85820 Personal history of malignant melanoma of skin: Secondary | ICD-10-CM | POA: Diagnosis not present

## 2020-04-13 ENCOUNTER — Other Ambulatory Visit: Payer: Self-pay

## 2020-04-13 ENCOUNTER — Ambulatory Visit (HOSPITAL_COMMUNITY)
Admission: RE | Admit: 2020-04-13 | Discharge: 2020-04-13 | Disposition: A | Discharge status: Home / Self Care | Payer: Medicare PPO | Source: Ambulatory Visit | Attending: Nurse Practitioner | Admitting: Nurse Practitioner

## 2020-04-13 ENCOUNTER — Encounter (HOSPITAL_COMMUNITY): Payer: Self-pay | Admitting: Nurse Practitioner

## 2020-04-13 VITALS — BP 128/68 | HR 54 | Ht 68.0 in | Wt 165.2 lb

## 2020-04-13 DIAGNOSIS — D6869 Other thrombophilia: Secondary | ICD-10-CM

## 2020-04-13 DIAGNOSIS — Z7901 Long term (current) use of anticoagulants: Secondary | ICD-10-CM | POA: Insufficient documentation

## 2020-04-13 DIAGNOSIS — I4819 Other persistent atrial fibrillation: Secondary | ICD-10-CM | POA: Diagnosis not present

## 2020-04-13 LAB — BASIC METABOLIC PANEL
Anion gap: 10 (ref 5–15)
BUN: 17 mg/dL (ref 8–23)
CO2: 28 mmol/L (ref 22–32)
Calcium: 8.8 mg/dL — ABNORMAL LOW (ref 8.9–10.3)
Chloride: 104 mmol/L (ref 98–111)
Creatinine, Ser: 1.35 mg/dL — ABNORMAL HIGH (ref 0.61–1.24)
GFR, Estimated: 51 mL/min — ABNORMAL LOW (ref >60)
Glucose, Bld: 102 mg/dL — ABNORMAL HIGH (ref 70–99)
Potassium: 4.3 mmol/L (ref 3.5–5.1)
Sodium: 142 mmol/L (ref 135–145)

## 2020-04-13 LAB — MAGNESIUM: Magnesium: 2.2 mg/dL (ref 1.7–2.4)

## 2020-04-13 NOTE — Addendum Note (Signed)
Encounter addended by: Enid Derry, CMA on: 04/13/2020 3:06 PM  Actions taken: Order Reconciliation Section accessed, Home Medications modified

## 2020-04-13 NOTE — Progress Notes (Signed)
Patient ID: Austin [name], male   DOB: [birth year], 86 y.o.   MRN: [id number]     Primary Care Physician: Burnard Bunting, MD Referring Physician: [facility] f/u EP: Dr. Reggy Eye KOSTANTINOS TALLMAN is a 86 y.o. male with a h/o persistent afib that failed flecainide, amiodarone and was admitted and loaded on tikosyn 3/27 thru 3/30. Renal function and electrolytes were followed during the hospitalization. QTc had prolongation and required down-titration of  Tiksoyn with improvement and stable QTc, his EKG reviewed by Dr. Curt Bears. He converted to sinus rhythm 08/12/15. He was monitored until discharge on telemetry which demonstrated SR.  F/u in afib clinic today, 09/24/19.He was recently seen by [name], PA at the [facility] office and he was in rhythm. QTc was 495 ms. There was a question if Tikosyn needed to be stopped for  qt concerns and changed  to amiodarone. He is on lowest dose of tikosyn at 125 mcg bid.  In the office here today, his qtc is 455 ms and he is in rhythm. He does not want change in therapy. He did try amiodarone briefly at one time, since he only has one eye, he was concerned that amiodarone would negatively effect his vision.  He continues on xarelto 15 mg daily for a CHA2DS2VASc of 3.   F/u in afib clinic, 8/25,he continues on Tikosyn and is maintaining SR. He is pending having his rt eye removed for ongoing pain/ha's with h/o of melanoma/shingles and  infection in the eye. He was advised to hold xarelto x 3 days. Qtc is stable.   F/u in afib clinic, 04/13/20. He is here for surveillance of Tikosyn use. He is not having any afib with Tikosyn use. He is pending more eye surgery Wednesday for melanoma found inside the socket when  eye was removed late summer. He is off DOAC today and will restart Wednesday pm. Qtc is stable.   Today, he denies symptoms of palpitations, chest pain, shortness of breath, orthopnea, PND, lower extremity edema, dizziness, presyncope, syncope, or  neurologic sequela. The patient is tolerating medications without difficulties and is otherwise without complaint today.   Past Medical History:  Diagnosis Date  . Allergy   . Asthma   . BPH (benign prostatic hypertrophy)   . CKD (chronic kidney disease) stage 3, GFR 30-59 ml/min   . Cough   . Gout   . Hypertensive chronic kidney disease   . Melanoma in situ of right eyelid ([place])   . OSA (obstructive sleep apnea)   . Persistent atrial fibrillation [facility])    Past Surgical History:  Procedure Laterality Date  . CARDIOVERSION [name] 06/05/2015   Procedure: CARDIOVERSION;  Surgeon: Sanda Klein, MD;  Location: [facility] ENDOSCOPY;  Service: Cardiovascular;  Laterality: [name];  . EYE SURGERY  12/2017    Current Outpatient Medications  Medication Sig Dispense Refill  . acetaminophen (TYLENOL) 500 MG tablet Take 1,000 mg by mouth daily as needed for mild pain or headache.    . albuterol (PROAIR HFA) 108 (90 Base) MCG/ACT inhaler Inhale 2 puffs into the lungs every 4 (four) hours as needed for wheezing or shortness of breath (cough).    . benzonatate (TESSALON) 100 MG capsule as needed.     . budesonide-formoterol (SYMBICORT) 80-4.5 MCG/ACT inhaler Inhale 2 puffs into the lungs 2 (two) times daily.     . butalbital-acetaminophen-caffeine (FIORICET, ESGIC) 50-325-40 MG tablet Take 1 tablet by mouth every 4 (four) hours as needed (severe headache). Maximum 4 tablets  per day  5  . carvedilol (COREG) 25 MG tablet TAKE 1 TABLET BY MOUTH TWICE A DAY 180 tablet 3  . clobetasol cream (TEMOVATE) 7.26 % Apply 1 application topically daily as needed (for itching).     . dofetilide (TIKOSYN) 125 MCG capsule TAKE 1 CAPSULE (125 MCG TOTAL) BY MOUTH 2 (TWO) TIMES DAILY. 180 capsule 3  . EPINEPHrine 0.15 MG/0.15ML IJ injection Inject 0.15 mg into the muscle as needed for anaphylaxis.    . fexofenadine (ALLEGRA ALLERGY) 60 MG tablet Take 60 mg by mouth every evening.     . fluticasone (FLONASE) 50 MCG/ACT nasal spray  Place 2 sprays into both nostrils at bedtime.   8  . furosemide (LASIX) 20 MG tablet Take 1 tablet (20 mg total) by mouth daily. 90 tablet 3  . gabapentin (NEURONTIN) 300 MG capsule Take 300 mg by mouth 2 (two) times daily.     . hydrOXYzine (ATARAX/VISTARIL) 25 MG tablet Take 37.5 mg by mouth at bedtime.    . indomethacin (INDOCIN) 50 MG capsule Take 50 mg by mouth as needed (for gout flare up).     . isosorbide mononitrate (IMDUR) 60 MG 24 hr tablet TAKE 1 TABLET BY MOUTH EVERY DAY 90 tablet 2  . KLOR-CON M20 20 MEQ tablet TAKE 1 TABLET BY MOUTH EVERY DAY 90 tablet 3  . MAGNESIUM CITRATE PO Take 250 mg by mouth at bedtime.    Marland Kitchen olmesartan (BENICAR) 40 MG tablet TAKE 1 TABLET BY MOUTH EVERY DAY. *REPLACES LOSARTAN 90 tablet 2  . Polyethyl Glycol-Propyl Glycol (SYSTANE PRESERVATIVE FREE) 0.4-0.3 % SOLN Place 1 drop into both eyes 2 (two) times daily.    . Probiotic Product (CULTRELLE KIDS IMMUNE DEFENSE PO) Take by mouth in the morning and at bedtime.    . triamcinolone (NASACORT ALLERGY 24HR) 55 MCG/ACT AERO nasal inhaler Place 2 sprays into the nose 2 (two) times daily.    . valACYclovir (VALTREX) 1000 MG tablet Take 1,000 mg by mouth daily.    [name] 15 MG TABS tablet TAKE 1 TABLET BY MOUTH EVERY DAY WITH SUPPER 90 tablet 1  . zolpidem (AMBIEN) 10 MG tablet Take 10 mg by mouth at bedtime as needed for sleep.   5   No current facility-administered medications for this encounter.    Allergies  Allergen Reactions  . Bee Venom Anaphylaxis  . Diltiazem Other (See Comments)    Severe headaches 120 mg BID  . Codeine Nausea And Vomiting  . Ivp Dye [Iodinated Diagnostic Agents] Rash  . Penicillins Swelling and Rash    Has patient had a PCN reaction causing immediate rash, facial/tongue/throat swelling, SOB or lightheadedness with hypotension: Yes Has patient had a PCN reaction causing severe rash involving mucus membranes or skin necrosis: No Has patient had a PCN reaction that required  hospitalization No Has patient had a PCN reaction occurring within the last 10 years: No If all of the above answers are "NO", then may proceed with Cephalosporin use.  . Tape Itching and Rash    Redness.  Please use "paper" tape only    Social History   Socioeconomic History  . Marital status: Married    Spouse name: [name]  . Number of children: Not on file  . Years of education: Not on file  . Highest education level: Not on file  Occupational History  . Not on file  Tobacco Use  . Smoking status: Never Smoker  . Smokeless tobacco: Never Used  Substance  and Sexual Activity  . Alcohol use: Yes    Alcohol/week: 1.0 standard drink    Types: 1 Standard drinks or equivalent per week    Comment: occ  . Drug use: No  . Sexual activity: Not on file  Other Topics Concern  . Not on file  Social History Narrative   Right Handed.  Caffiene  Coffee, tea 3 cups daily.   Pt retired.     Lives at home with wife.     Social Determinants of Health   Financial Resource Strain:   . Difficulty of Paying Living Expenses: Not on file  Food Insecurity:   . Worried About Charity fundraiser in the Last Year: Not on file  . Ran Out of Food in the Last Year: Not on file  Transportation Needs:   . Lack of Transportation (Medical): Not on file  . Lack of Transportation (Non-Medical): Not on file  Physical Activity:   . Days of Exercise per Week: Not on file  . Minutes of Exercise per Session: Not on file  Stress:   . Feeling of Stress : Not on file  Social Connections:   . Frequency of Communication with Friends and Family: Not on file  . Frequency of Social Gatherings with Friends and Family: Not on file  . Attends Religious Services: Not on file  . Active Member of Clubs or Organizations: Not on file  . Attends Archivist Meetings: Not on file  . Marital Status: Not on file  Intimate Partner Violence:   . Fear of Current or Ex-Partner: Not on file  . Emotionally Abused: Not  on file  . Physically Abused: Not on file  . Sexually Abused: Not on file    Family History  Problem Relation Age of Onset  . Hypertension Father   . Lung cancer Father   . Hypertension Mother     ROS- All systems are reviewed and negative except as per the HPI above  Physical Exam: Vitals:   04/13/20 0841  Weight: 74.9 kg  Height: 5\' 8"  (1.727 m)    GEN- The patient is well appearing, alert and oriented x 3 today.   Head- normocephalic, atraumatic Eyes-  Sclera clear, conjunctiva pink. Blind rt eye Ears- hearing intact Oropharynx- clear Neck- supple, no JVP Lymph- no cervical lymphadenopathy Lungs- Clear to ausculation bilaterally, normal work of breathing Heart- Regular rate and rhythm, no murmurs, rubs or gallops, PMI not laterally displaced GI- soft, NT, ND, + BS Extremities- no clubbing, cyanosis, or edema MS- no significant deformity or atrophy Skin- no rash or lesion Psych- euthymic mood, full affect Neuro- strength and sensation are intact  EKG- sinus brady at 54 bpm, pr int 192 ms, qrs int 100 ms, 469ms (stable)  Epic records reviewed  Assessment and Plan: 1. Persistent afib Has been maintaining for  SR for years on tikosyn  Qt  within normal range at 457 ms  Continue Tikosyn 125 mg bid Mag today, K+ was 4.0 when checked at [facility] 11/9  2. CHA2DS2VASc score  of 3 Continue xarelto 15 mg qd He is now off anticoagulation until Wednesday to have additional rt eye surgery for melanoma   F/u here in 4 months    Butch Penny C. Annabeth Tortora, [facility] [address] [place], [place] [address] [phone]

## 2020-04-13 NOTE — Addendum Note (Signed)
Encounter addended by: Juluis Mire, RN on: 04/13/2020 10:29 AM  Actions taken: Order Reconciliation Section accessed, Home Medications modified, Medication List reviewed, Medication taking status modified

## 2020-04-14 ENCOUNTER — Other Ambulatory Visit: Payer: Self-pay | Admitting: Cardiology

## 2020-04-14 DIAGNOSIS — I10 Essential (primary) hypertension: Secondary | ICD-10-CM

## 2020-04-14 DIAGNOSIS — N183 Chronic kidney disease, stage 3 unspecified: Secondary | ICD-10-CM

## 2020-04-15 DIAGNOSIS — Z9001 Acquired absence of eye: Secondary | ICD-10-CM | POA: Diagnosis not present

## 2020-04-15 DIAGNOSIS — C6901 Malignant neoplasm of right conjunctiva: Secondary | ICD-10-CM | POA: Diagnosis not present

## 2020-04-15 DIAGNOSIS — Z9889 Other specified postprocedural states: Secondary | ICD-10-CM | POA: Diagnosis not present

## 2020-04-15 DIAGNOSIS — H11131 Conjunctival pigmentations, right eye: Secondary | ICD-10-CM | POA: Diagnosis not present

## 2020-04-19 ENCOUNTER — Other Ambulatory Visit: Payer: Self-pay | Admitting: Physician Assistant

## 2020-04-19 DIAGNOSIS — I10 Essential (primary) hypertension: Secondary | ICD-10-CM

## 2020-04-19 DIAGNOSIS — N183 Chronic kidney disease, stage 3 unspecified: Secondary | ICD-10-CM

## 2020-04-19 DIAGNOSIS — I4819 Other persistent atrial fibrillation: Secondary | ICD-10-CM

## 2020-04-23 DIAGNOSIS — Z79899 Other long term (current) drug therapy: Secondary | ICD-10-CM | POA: Diagnosis not present

## 2020-04-23 DIAGNOSIS — Z48810 Encounter for surgical aftercare following surgery on the sense organs: Secondary | ICD-10-CM | POA: Diagnosis not present

## 2020-04-23 DIAGNOSIS — Z9001 Acquired absence of eye: Secondary | ICD-10-CM | POA: Diagnosis not present

## 2020-04-23 DIAGNOSIS — Z9889 Other specified postprocedural states: Secondary | ICD-10-CM | POA: Diagnosis not present

## 2020-05-04 ENCOUNTER — Other Ambulatory Visit: Payer: Self-pay | Admitting: Cardiology

## 2020-05-19 DIAGNOSIS — C6901 Malignant neoplasm of right conjunctiva: Secondary | ICD-10-CM | POA: Diagnosis not present

## 2020-05-19 DIAGNOSIS — Z9889 Other specified postprocedural states: Secondary | ICD-10-CM | POA: Diagnosis not present

## 2020-05-19 DIAGNOSIS — Z79899 Other long term (current) drug therapy: Secondary | ICD-10-CM | POA: Diagnosis not present

## 2020-06-09 DIAGNOSIS — Z85828 Personal history of other malignant neoplasm of skin: Secondary | ICD-10-CM | POA: Diagnosis not present

## 2020-06-09 DIAGNOSIS — L570 Actinic keratosis: Secondary | ICD-10-CM | POA: Diagnosis not present

## 2020-06-09 DIAGNOSIS — L298 Other pruritus: Secondary | ICD-10-CM | POA: Diagnosis not present

## 2020-06-09 DIAGNOSIS — L720 Epidermal cyst: Secondary | ICD-10-CM | POA: Diagnosis not present

## 2020-06-09 DIAGNOSIS — C44612 Basal cell carcinoma of skin of right upper limb, including shoulder: Secondary | ICD-10-CM | POA: Diagnosis not present

## 2020-06-09 DIAGNOSIS — C44619 Basal cell carcinoma of skin of left upper limb, including shoulder: Secondary | ICD-10-CM | POA: Diagnosis not present

## 2020-06-09 DIAGNOSIS — L821 Other seborrheic keratosis: Secondary | ICD-10-CM | POA: Diagnosis not present

## 2020-06-17 DIAGNOSIS — Z961 Presence of intraocular lens: Secondary | ICD-10-CM | POA: Diagnosis not present

## 2020-06-17 DIAGNOSIS — C6901 Malignant neoplasm of right conjunctiva: Secondary | ICD-10-CM | POA: Diagnosis not present

## 2020-06-17 DIAGNOSIS — Z9889 Other specified postprocedural states: Secondary | ICD-10-CM | POA: Diagnosis not present

## 2020-06-17 DIAGNOSIS — Z9001 Acquired absence of eye: Secondary | ICD-10-CM | POA: Diagnosis not present

## 2020-06-17 DIAGNOSIS — Z8619 Personal history of other infectious and parasitic diseases: Secondary | ICD-10-CM | POA: Diagnosis not present

## 2020-07-22 ENCOUNTER — Other Ambulatory Visit: Payer: Self-pay | Admitting: Cardiology

## 2020-08-10 ENCOUNTER — Other Ambulatory Visit: Payer: Self-pay | Admitting: Cardiology

## 2020-08-11 ENCOUNTER — Other Ambulatory Visit: Payer: Self-pay | Admitting: Cardiology

## 2020-08-11 DIAGNOSIS — I10 Essential (primary) hypertension: Secondary | ICD-10-CM

## 2020-08-11 DIAGNOSIS — N183 Chronic kidney disease, stage 3 unspecified: Secondary | ICD-10-CM

## 2020-08-11 NOTE — Telephone Encounter (Signed)
This pt was referred by Dr. Curt Bears to the A-Fib clinic. Clinic sees this pt for Dr. Curt Bears. Please address

## 2020-08-11 NOTE — Telephone Encounter (Signed)
This is a A-Fib clinic pt. Clinic sees pt for Dr. Curt Bears. Please address

## 2020-08-12 DIAGNOSIS — F3289 Other specified depressive episodes: Secondary | ICD-10-CM | POA: Diagnosis not present

## 2020-08-12 DIAGNOSIS — F5101 Primary insomnia: Secondary | ICD-10-CM | POA: Diagnosis not present

## 2020-08-12 DIAGNOSIS — N390 Urinary tract infection, site not specified: Secondary | ICD-10-CM | POA: Diagnosis not present

## 2020-08-12 DIAGNOSIS — R0600 Dyspnea, unspecified: Secondary | ICD-10-CM | POA: Diagnosis not present

## 2020-08-12 DIAGNOSIS — I129 Hypertensive chronic kidney disease with stage 1 through stage 4 chronic kidney disease, or unspecified chronic kidney disease: Secondary | ICD-10-CM | POA: Diagnosis not present

## 2020-08-12 DIAGNOSIS — R059 Cough, unspecified: Secondary | ICD-10-CM | POA: Diagnosis not present

## 2020-08-12 DIAGNOSIS — J449 Chronic obstructive pulmonary disease, unspecified: Secondary | ICD-10-CM | POA: Diagnosis not present

## 2020-08-12 DIAGNOSIS — M1990 Unspecified osteoarthritis, unspecified site: Secondary | ICD-10-CM | POA: Diagnosis not present

## 2020-08-12 DIAGNOSIS — R197 Diarrhea, unspecified: Secondary | ICD-10-CM | POA: Diagnosis not present

## 2020-08-18 DIAGNOSIS — C6901 Malignant neoplasm of right conjunctiva: Secondary | ICD-10-CM | POA: Diagnosis not present

## 2020-08-18 DIAGNOSIS — Z9889 Other specified postprocedural states: Secondary | ICD-10-CM | POA: Diagnosis not present

## 2020-08-19 ENCOUNTER — Other Ambulatory Visit: Payer: Self-pay

## 2020-08-19 ENCOUNTER — Encounter (HOSPITAL_COMMUNITY): Payer: Self-pay | Admitting: Nurse Practitioner

## 2020-08-19 ENCOUNTER — Ambulatory Visit (HOSPITAL_COMMUNITY)
Admission: RE | Admit: 2020-08-19 | Discharge: 2020-08-19 | Disposition: A | Discharge status: Home / Self Care | Payer: Medicare PPO | Source: Ambulatory Visit | Attending: Nurse Practitioner | Admitting: Nurse Practitioner

## 2020-08-19 VITALS — BP 126/70 | HR 51 | Ht 68.0 in | Wt 169.4 lb

## 2020-08-19 DIAGNOSIS — Z7901 Long term (current) use of anticoagulants: Secondary | ICD-10-CM | POA: Insufficient documentation

## 2020-08-19 DIAGNOSIS — I351 Nonrheumatic aortic (valve) insufficiency: Secondary | ICD-10-CM | POA: Diagnosis not present

## 2020-08-19 DIAGNOSIS — I4819 Other persistent atrial fibrillation: Secondary | ICD-10-CM | POA: Diagnosis not present

## 2020-08-19 DIAGNOSIS — R0600 Dyspnea, unspecified: Secondary | ICD-10-CM

## 2020-08-19 DIAGNOSIS — Z7951 Long term (current) use of inhaled steroids: Secondary | ICD-10-CM | POA: Insufficient documentation

## 2020-08-19 DIAGNOSIS — I129 Hypertensive chronic kidney disease with stage 1 through stage 4 chronic kidney disease, or unspecified chronic kidney disease: Secondary | ICD-10-CM | POA: Insufficient documentation

## 2020-08-19 DIAGNOSIS — R0609 Other forms of dyspnea: Secondary | ICD-10-CM | POA: Insufficient documentation

## 2020-08-19 DIAGNOSIS — Z79899 Other long term (current) drug therapy: Secondary | ICD-10-CM | POA: Insufficient documentation

## 2020-08-19 DIAGNOSIS — R5383 Other fatigue: Secondary | ICD-10-CM | POA: Insufficient documentation

## 2020-08-19 DIAGNOSIS — N183 Chronic kidney disease, stage 3 unspecified: Secondary | ICD-10-CM | POA: Diagnosis not present

## 2020-08-19 DIAGNOSIS — I440 Atrioventricular block, first degree: Secondary | ICD-10-CM | POA: Insufficient documentation

## 2020-08-19 DIAGNOSIS — D6869 Other thrombophilia: Secondary | ICD-10-CM

## 2020-08-19 LAB — BASIC METABOLIC PANEL
Anion gap: 5 (ref 5–15)
BUN: 19 mg/dL (ref 8–23)
CO2: 25 mmol/L (ref 22–32)
Calcium: 8.5 mg/dL — ABNORMAL LOW (ref 8.9–10.3)
Chloride: 109 mmol/L (ref 98–111)
Creatinine, Ser: 1.87 mg/dL — ABNORMAL HIGH (ref 0.61–1.24)
GFR, Estimated: 34 mL/min — ABNORMAL LOW (ref >60)
Glucose, Bld: 99 mg/dL (ref 70–99)
Potassium: 4.5 mmol/L (ref 3.5–5.1)
Sodium: 139 mmol/L (ref 135–145)

## 2020-08-19 LAB — MAGNESIUM: Magnesium: 2.3 mg/dL (ref 1.7–2.4)

## 2020-08-19 MED ORDER — CARVEDILOL 25 MG PO TABS
12.5000 mg | ORAL_TABLET | Freq: Two times a day (BID) | ORAL | 1 refills | Status: DC
Start: 2020-08-19 — End: 2020-10-21

## 2020-08-19 NOTE — Progress Notes (Signed)
Patient ID: Austin [name], male   DOB: 1930-11-16, 87 y.o.   MRN: [id number]     Primary Care Physician: Burnard Bunting, MD Referring Physician: [facility] f/u EP: Dr. Reggy Eye Austin [name] is a 87 y.o. male with a h/o persistent afib that failed flecainide, amiodarone and was admitted and loaded on tikosyn 3/27 thru 3/30. Renal function and electrolytes were followed during the hospitalization. QTc had prolongation and required down-titration of  Tiksoyn with improvement and stable QTc, his EKG reviewed by Dr. Curt Bears. He converted to sinus rhythm 08/12/15. He was monitored until discharge on telemetry which demonstrated SR.  F/u in afib clinic today, 09/24/19.He was recently seen by [name], PA at the [facility] office and he was in rhythm. QTc was 495 ms. There was a question if Tikosyn needed to be stopped for  qt concerns and changed  to amiodarone. He is on lowest dose of tikosyn at 125 mcg bid.  In the office here today, his qtc is 455 ms and he is in rhythm. He does not want change in therapy. He did try amiodarone briefly at one time, since he only has one eye, he was concerned that amiodarone would negatively effect his vision.  He continues on xarelto 15 mg daily for a CHA2DS2VASc of 3.   F/u in afib clinic, 8/25,he continues on Tikosyn and is maintaining SR. He is pending having his rt eye removed for ongoing pain/ha's with h/o of melanoma/shingles and  infection in the eye. He was advised to hold xarelto x 3 days. Qtc is stable.   F/u in afib clinic, 04/13/20. He is here for surveillance of Tikosyn use. He is not having any afib with Tikosyn use. He is pending more eye surgery Wednesday for melanoma found inside the socket when  eye was removed late summer. He is off DOAC today and will restart Wednesday pm. Qtc is stable.   F/u in afib clinic, 08/19/20. He is reporting fatigue and shortness of breath. Saw his PCP this last week was very weak and had a systolic BP in the 25'Z.  PCP felt he may have a UTI and started on antibiotics and also felt that he may be mildly dehydrated. He is staying in [place]. His HR though is 51 bpm and maybe bradycardia from BB may be contributing to symptoms. If this  does not help will need to discuss obtaining  a stress test. Last stress test was in 2017 did not show any ischemia. [place] that [place] the beach [place] he did have shortness of breath carrying items up the stairs that was much worse that in previous years.   Today, he denies symptoms of palpitations, chest pain, shortness of breath, orthopnea, PND, lower extremity edema, dizziness, presyncope, syncope, or neurologic sequela. The patient is tolerating medications without difficulties and is otherwise without complaint today.   Past Medical History:  Diagnosis Date  . Allergy   . Asthma   . BPH (benign prostatic hypertrophy)   . CKD (chronic kidney disease) stage 3, GFR 30-59 ml/min ([facility])   . Cough   . Gout   . Hypertensive chronic kidney disease   . Melanoma in situ of right eyelid ([place])   . OSA (obstructive sleep apnea)   . Persistent atrial fibrillation [facility])    Past Surgical History:  Procedure Laterality Date  . CARDIOVERSION [name] 06/05/2015   Procedure: CARDIOVERSION;  Surgeon: Sanda Klein, MD;  Location: [facility] ENDOSCOPY;  Service: Cardiovascular;  Laterality: [name];  .  EYE SURGERY  12/2017    Current Outpatient Medications  Medication Sig Dispense Refill  . acetaminophen (TYLENOL) 500 MG tablet Take 500 mg by mouth every 6 (six) hours as needed.     Marland Kitchen albuterol (VENTOLIN HFA) 108 (90 Base) MCG/ACT inhaler Inhale 2 puffs into the lungs every 4 (four) hours as needed for wheezing or shortness of breath (cough).    . budesonide-formoterol (SYMBICORT) 80-4.5 MCG/ACT inhaler Inhale 2 puffs into the lungs 2 (two) times daily.     . butalbital-acetaminophen-caffeine (FIORICET, ESGIC) 50-325-40 MG tablet Take 1 tablet by mouth every 4 (four) hours as needed (severe headache).  Maximum 4 tablets per day  5  . clobetasol cream (TEMOVATE) 1.94 % Apply 1 application topically daily as needed (for itching).     . dofetilide (TIKOSYN) 125 MCG capsule TAKE 1 CAPSULE (125 MCG TOTAL) BY MOUTH 2 (TWO) TIMES DAILY. 180 capsule 3  . EPINEPHrine 0.15 MG/0.15ML IJ injection Inject 0.15 mg into the muscle as needed for anaphylaxis.    . fexofenadine (ALLEGRA) 60 MG tablet Take 60 mg by mouth every evening.     . fluticasone (FLONASE) 50 MCG/ACT nasal spray Place 2 sprays into both nostrils at bedtime.   8  . furosemide (LASIX) 20 MG tablet Take 1 tablet (20 mg total) by mouth daily. Please keep upcoming appt for future refills 90 tablet 0  . hydrOXYzine (ATARAX/VISTARIL) 25 MG tablet Take 37.5 mg by mouth at bedtime.    . indomethacin (INDOCIN) 50 MG capsule Take 50 mg by mouth as needed (for gout flare up).    . isosorbide mononitrate (IMDUR) 60 MG 24 hr tablet TAKE 1 TABLET BY MOUTH EVERY DAY 90 tablet 2  . KLOR-CON M20 20 MEQ tablet TAKE 1 TABLET BY MOUTH EVERY DAY 90 tablet 3  . lidocaine-prilocaine (EMLA) cream Apply topically as needed.     Marland Kitchen MAGNESIUM CITRATE PO Take 250 mg by mouth at bedtime.    Marland Kitchen olmesartan (BENICAR) 40 MG tablet TAKE 1 TABLET BY MOUTH EVERY DAY. *REPLACES LOSARTAN 90 tablet 2  . Polyethyl Glycol-Propyl Glycol 0.4-0.3 % SOLN Place 1 drop into both eyes 2 (two) times daily.    . Probiotic Product ([place]) CAPS Take 1 capsule by mouth daily.    . promethazine (PHENERGAN) 25 MG tablet Take by mouth.    . sertraline (ZOLOFT) 50 MG tablet Take 50 mg by mouth daily.     Marland Kitchen triamcinolone (NASACORT) 55 MCG/ACT AERO nasal inhaler Place 2 sprays into the nose 2 (two) times daily.    . valACYclovir (VALTREX) 1000 MG tablet Take 1,000 mg by mouth daily.    [name] 15 MG TABS tablet TAKE 1 TABLET BY MOUTH EVERY DAY WITH SUPPER 90 tablet 1  . zolpidem (AMBIEN) 10 MG tablet Take 10 mg by mouth at bedtime as needed for sleep.   5  . carvedilol (COREG) 25  MG tablet Take 0.5 tablets (12.5 mg total) by mouth 2 (two) times daily. 90 tablet 1   No current facility-administered medications for this encounter.    Allergies  Allergen Reactions  . Bee Venom Anaphylaxis  . Diltiazem Other (See Comments)    Severe headaches 120 mg BID  . Codeine Nausea And Vomiting  . Ivp Dye [Iodinated Diagnostic Agents] Rash  . Penicillins Swelling and Rash    Has patient had a PCN reaction causing immediate rash, facial/tongue/throat swelling, SOB or lightheadedness with hypotension: Yes Has patient had a PCN reaction  causing severe rash involving mucus membranes or skin necrosis: No Has patient had a PCN reaction that required hospitalization No Has patient had a PCN reaction occurring within the last 10 years: No If all of the above answers are "NO", then may proceed with Cephalosporin use.  . Tape Itching and Rash    Redness.  Please use "paper" tape only    Social History   Socioeconomic History  . Marital status: Married    Spouse name: Austin [name]  . Number of children: Not on file  . Years of education: Not on file  . Highest education level: Not on file  Occupational History  . Not on file  Tobacco Use  . Smoking status: Never Smoker  . Smokeless tobacco: Never Used  Substance and Sexual Activity  . Alcohol use: Yes    Alcohol/week: 1.0 standard drink    Types: 1 Standard drinks or equivalent per week    Comment: occ  . Drug use: No  . Sexual activity: Not on file  Other Topics Concern  . Not on file  Social History Narrative   Right Handed.  Caffiene  Coffee, tea 3 cups daily.   Pt retired.     Lives at home with wife.     Social Determinants of Health   Financial Resource Strain: Not on file  Food Insecurity: Not on file  Transportation Needs: Not on file  Physical Activity: Not on file  Stress: Not on file  Social Connections: Not on file  Intimate Partner Violence: Not on file    Family History  Problem Relation Age of Onset   . Hypertension Father   . Lung cancer Father   . Hypertension Mother     ROS- All systems are reviewed and negative except as per the HPI above  Physical Exam: Vitals:   08/19/20 1003  BP: 126/70  Pulse: (!) 51  Weight: 76.8 kg  Height: 5\' 8"  (1.727 m)    GEN- The patient is well appearing, alert and oriented x 3 today.   Head- normocephalic, atraumatic Eyes-  Sclera clear, conjunctiva pink. Blind rt eye Ears- hearing intact Oropharynx- clear Neck- supple, no JVP Lymph- no cervical lymphadenopathy Lungs- Clear to ausculation bilaterally, normal work of breathing Heart- Regular rate and rhythm, no murmurs, rubs or gallops, PMI not laterally displaced GI- soft, NT, ND, + BS Extremities- no clubbing, cyanosis, or edema MS- no significant deformity or atrophy Skin- no rash or lesion Psych- euthymic mood, full affect Neuro- strength and sensation are intact  EKG- sinus brady at 51 bpm, with first degree AV block,  pr int 214 ms, qrs int 104 ms, 458 ms (stable)  Epic records reviewed  2021-Echo- . Left ventricular ejection fraction, by estimation, is 60 to 65%. The left ventricle has normal function. The left ventricle has no regional wall motion abnormalities. There is severe left ventricular hypertrophy. Left ventricular diastolic parameters are consistent with Grade II diastolic dysfunction (pseudonormalization). Elevated left atrial pressure. 2. Right ventricular systolic function is mildly reduced. The right ventricular size is normal. There is normal pulmonary artery systolic pressure. 3. The mitral valve is normal in structure. Trivial mitral valve regurgitation. 4. The aortic valve was not well visualized. Aortic valve regurgitation is mild. Mild aortic valve stenosis. Vmax 2.3 m/s, Mg 9 mmHg, AVA 1.3 cm^2, DI 0.43 5. Aortic dilatation noted. There is mild dilatation of the aortic root measuring 40 mm. Mild dilatation of the ascending aorta measuring 24mm 6. Left  atrial size  was mildly dilated. 7. Severe asymmetric hypertrophy of basal septum. Also with apical hypertrophy with cavity obliteration during systole. Consider cardiac MRI to evaluate for hypertrophic cardiomyopathy or amyloidosis.   2017-Nuclear stress EF: 55%.  There was no ST segment deviation noted during stress. Patient was in atrial fibrillation throughout the study.  There is a small defect of mild severity present in the basal inferolateral and mid inferolateral location. The defect is non-reversible. There is a small defect of mild severity present in the basal inferior and mid inferior location. The defect is non-reversible.There is a small defect of mild severity present in the apex location. The defect is non-reversible. No ischemia noted.  This is a low risk study.  The left ventricular ejection fraction is normal (55-65%).    Assessment and Plan: 1. Persistent afib Has been maintaining SR  Qt  within normal range at 458 ms  Continue Tikosyn 125 mg bid Bmet/mag today   2. CHA2DS2VASc score  of 3 Continue xarelto 15 mg qd  3. Exertional dyspnea and fatigue HR at 51 bpm Will try decreasing carvedilol to 12.5 mg bid  If symptoms do not improve with reduction  in BB, will discuss with Dr. Curt Bears as to obtaining a stress test  or repeating echo for severe left ventricular hypertrophy, or both   F/u here in 4 months for tikosyn surveillance    Austin [name], [facility] [address] [place], [place] [address] [phone]

## 2020-08-19 NOTE — Patient Instructions (Signed)
Decrease coreg to 12.5mg  twice a day (1/2 of your 25mg  tablet)  Call in 1 week with update of heart rates/ blood pressures - ask for [facility]

## 2020-08-26 ENCOUNTER — Telehealth (HOSPITAL_COMMUNITY): Payer: Self-pay | Admitting: *Deleted

## 2020-08-26 NOTE — Telephone Encounter (Signed)
Patient bp since reduction of coreg to 12.5mg  BID.   Range from [phone] HR 59-73 feeling much improved energy wise. Per Roderic Palau NP will follow BP for another week and see in office for BP/BMET PT in agreement.

## 2020-09-03 ENCOUNTER — Encounter (HOSPITAL_COMMUNITY): Payer: Self-pay | Admitting: Nurse Practitioner

## 2020-09-03 ENCOUNTER — Ambulatory Visit (HOSPITAL_COMMUNITY)
Admission: RE | Admit: 2020-09-03 | Discharge: 2020-09-03 | Disposition: A | Discharge status: Home / Self Care | Payer: Medicare PPO | Source: Ambulatory Visit | Attending: Nurse Practitioner | Admitting: Nurse Practitioner

## 2020-09-03 ENCOUNTER — Other Ambulatory Visit: Payer: Self-pay

## 2020-09-03 DIAGNOSIS — I4819 Other persistent atrial fibrillation: Secondary | ICD-10-CM | POA: Insufficient documentation

## 2020-09-03 LAB — BASIC METABOLIC PANEL
Anion gap: 5 (ref 5–15)
BUN: 16 mg/dL (ref 8–23)
CO2: 28 mmol/L (ref 22–32)
Calcium: 8.7 mg/dL — ABNORMAL LOW (ref 8.9–10.3)
Chloride: 108 mmol/L (ref 98–111)
Creatinine, Ser: 1.41 mg/dL — ABNORMAL HIGH (ref 0.61–1.24)
GFR, Estimated: 48 mL/min — ABNORMAL LOW (ref >60)
Glucose, Bld: 89 mg/dL (ref 70–99)
Potassium: 4.4 mmol/L (ref 3.5–5.1)
Sodium: 141 mmol/L (ref 135–145)

## 2020-09-10 ENCOUNTER — Ambulatory Visit: Payer: Medicare PPO | Admitting: Adult Health

## 2020-09-10 ENCOUNTER — Encounter: Payer: Self-pay | Admitting: Adult Health

## 2020-09-10 VITALS — BP 126/74 | HR 60 | Ht 68.0 in | Wt 161.0 lb

## 2020-09-10 DIAGNOSIS — G4733 Obstructive sleep apnea (adult) (pediatric): Secondary | ICD-10-CM | POA: Diagnosis not present

## 2020-09-10 DIAGNOSIS — J449 Chronic obstructive pulmonary disease, unspecified: Secondary | ICD-10-CM | POA: Diagnosis not present

## 2020-09-10 NOTE — Progress Notes (Signed)
Marland Kitchen    PATIENT: Austin [name] DOB: December 15, 1930  REASON FOR VISIT: follow up HISTORY FROM: patient  HISTORY OF PRESENT ILLNESS: Today 09/10/19:  [name] is an 87 year old male with a history of obstructive sleep apnea on CPAP.  He returns today for follow-up.  He states that he has been having a hard time using the CPAP as he feels that the air is too hot.  He also states that the mask straps has been irritating the sides of his face.  He returns today for an evaluation.     03/11/20: Mr. [name] is an 87 year old male with a history of obstructive sleep apnea on CPAP.  He returns today for follow-up.  He reports that he has 2 machines.  [place]  He states that he recently went to the beach for a weekend plus he also had 1 week that he was unable to use the machine as it was being fixed.  His download indicates that he uses machine 14 out of 30 days for compliance of 47%.  He uses machine greater than 4 hours each night.  On average he uses his machine 9 hours and 5 minutes.  His residual AHI is 0.9 on 6 to 10 cm of water with EPR 3.  Leak in the 95th percentile is 6.5 L/min.  He returns today for an evaluation.  HISTORY (Copied from Dr.Dohmeier's note) 03/12/19: CD - [name] is a 87 year old Caucasian gentleman and uses CPAP compliantly.   His ResMed air view air sense machine has been used 100% of the last 30 days with the last data from 11 March 2019.  Average user x8 hours 50 minutes minimum pressure of 6 maximum pressure 10 and EPR level is 2 cmH2O, the 95th percentile pressure is 9.9 cmH2O there are no significant residual apneas observed the AHI is 0.5/h and the air leak is very low.  The patient is a diagnosis of OSA overlapping with COPD.  He still endorsed the Epworth Sleepiness Scale at 12 out of 24 points and the fatigue severity scale is 30 out of 63 the geriatric depression score at 1 out of 15.  His wife states that he has been a  entirely new person since using the new settings.  And he has been much less sleepy and fatigue and daytime, however it would be worthwhile looking for a pulse oximetry on CPAP to make sure that he does not suffer from hypoxic episodes in spite of a controlled sleep apnea.   REVIEW OF SYSTEMS: Out of a complete 14 system review of symptoms, the patient complains only of the following symptoms, and all other reviewed systems are negative.  ESS 7  ALLERGIES: Allergies  Allergen Reactions  . Bee Venom Anaphylaxis  . Diltiazem Other (See Comments)    Severe headaches 120 mg BID  . Codeine Nausea And Vomiting  . Ivp Dye [Iodinated Diagnostic Agents] Rash  . Penicillins Swelling and Rash    Has patient had a PCN reaction causing immediate rash, facial/tongue/throat swelling, SOB or lightheadedness with hypotension: Yes Has patient had a PCN reaction causing severe rash involving mucus membranes or skin necrosis: No Has patient had a PCN reaction that required hospitalization No Has patient had a PCN reaction occurring within the last 10 years: No If all of the above answers are "NO", then may proceed with Cephalosporin use.  . Tape Itching and Rash    Redness.  Please  use "paper" tape only    HOME MEDICATIONS: Outpatient Medications Prior to Visit  Medication Sig Dispense Refill  . acetaminophen (TYLENOL) 500 MG tablet Take 500 mg by mouth every 6 (six) hours as needed.     Marland Kitchen albuterol (VENTOLIN HFA) 108 (90 Base) MCG/ACT inhaler Inhale 2 puffs into the lungs every 4 (four) hours as needed for wheezing or shortness of breath (cough).    . budesonide-formoterol (SYMBICORT) 80-4.5 MCG/ACT inhaler Inhale 2 puffs into the lungs 2 (two) times daily.     . butalbital-acetaminophen-caffeine (FIORICET, ESGIC) 50-325-40 MG tablet Take 1 tablet by mouth every 4 (four) hours as needed (severe headache). Maximum 4 tablets per day  5  . carvedilol (COREG) 25 MG tablet Take 0.5 tablets (12.5 mg total)  by mouth 2 (two) times daily. 90 tablet 1  . clobetasol cream (TEMOVATE) [id number] % Apply 1 application topically daily as needed (for itching).     . dofetilide (TIKOSYN) 125 MCG capsule TAKE 1 CAPSULE (125 MCG TOTAL) BY MOUTH 2 (TWO) TIMES DAILY. 180 capsule 3  . EPINEPHrine 0.15 MG/0.15ML IJ injection Inject 0.15 mg into the muscle as needed for anaphylaxis.    . fexofenadine (ALLEGRA) 60 MG tablet Take 60 mg by mouth every evening.     . fluticasone (FLONASE) 50 MCG/ACT nasal spray Place 2 sprays into both nostrils at bedtime.   8  . furosemide (LASIX) 20 MG tablet Take 1 tablet (20 mg total) by mouth daily. Please keep upcoming appt for future refills 90 tablet 0  . indomethacin (INDOCIN) 50 MG capsule Take 50 mg by mouth as needed (for gout flare up).    . isosorbide mononitrate (IMDUR) 60 MG 24 hr tablet TAKE 1 TABLET BY MOUTH EVERY DAY 90 tablet 2  . KLOR-CON M20 20 MEQ tablet TAKE 1 TABLET BY MOUTH EVERY DAY 90 tablet 3  . lidocaine-prilocaine (EMLA) cream Apply topically as needed.     Marland Kitchen MAGNESIUM CITRATE PO Take 250 mg by mouth at bedtime.    Marland Kitchen olmesartan (BENICAR) 40 MG tablet TAKE 1 TABLET BY MOUTH EVERY DAY. *REPLACES LOSARTAN 90 tablet 2  . Polyethyl Glycol-Propyl Glycol 0.4-0.3 % SOLN Place 1 drop into both eyes 2 (two) times daily.    . Probiotic Product (Long) CAPS Take 1 capsule by mouth daily.    . promethazine (PHENERGAN) 25 MG tablet Take by mouth.    . sertraline (ZOLOFT) 50 MG tablet Take 50 mg by mouth daily.     Marland Kitchen triamcinolone (NASACORT) 55 MCG/ACT AERO nasal inhaler Place 2 sprays into the nose 2 (two) times daily.    . valACYclovir (VALTREX) 1000 MG tablet Take 1,000 mg by mouth daily.    [name] 15 MG TABS tablet TAKE 1 TABLET BY MOUTH EVERY DAY WITH SUPPER 90 tablet 1  . zolpidem (AMBIEN) 10 MG tablet Take 10 mg by mouth at bedtime as needed for sleep.   5  . hydrOXYzine (ATARAX/VISTARIL) 25 MG tablet Take 37.5 mg by mouth at bedtime.     No  facility-administered medications prior to visit.    PAST MEDICAL HISTORY: Past Medical History:  Diagnosis Date  . Allergy   . Asthma   . BPH (benign prostatic hypertrophy)   . CKD (chronic kidney disease) stage 3, GFR 30-59 ml/min (HCC)   . Cough   . Gout   . Hypertensive chronic kidney disease   . Melanoma in situ of right eyelid ([place])   . OSA (obstructive  sleep apnea)   . Persistent atrial fibrillation ([place])     PAST SURGICAL HISTORY: Past Surgical History:  Procedure Laterality Date  . CARDIOVERSION [name] 06/05/2015   Procedure: CARDIOVERSION;  Surgeon: Sanda Klein, MD;  Location: [facility];  Service: Cardiovascular;  Laterality: [name];  . EYE SURGERY  12/2017    FAMILY HISTORY: Family History  Problem Relation Age of Onset  . Hypertension Father   . Lung cancer Father   . Hypertension Mother     SOCIAL HISTORY: Social History   Socioeconomic History  . Marital status: Married    Spouse name: [name]  . Number of children: Not on file  . Years of education: Not on file  . Highest education level: Not on file  Occupational History  . Not on file  Tobacco Use  . Smoking status: Never Smoker  . Smokeless tobacco: Never Used  Substance and Sexual Activity  . Alcohol use: Yes    Alcohol/week: 1.0 standard drink    Types: 1 Standard drinks or equivalent per week    Comment: occ  . Drug use: No  . Sexual activity: Not on file  Other Topics Concern  . Not on file  Social History Narrative   Right Handed.  Caffiene  Coffee, tea 3 cups daily.   Pt retired.     Lives at home with wife.     Social Determinants of Health   Financial Resource Strain: Not on file  Food Insecurity: Not on file  Transportation Needs: Not on file  Physical Activity: Not on file  Stress: Not on file  Social Connections: Not on file  Intimate Partner Violence: Not on file      PHYSICAL EXAM  Vitals:   09/10/20 0928  BP: 126/74  Pulse: 60  Weight: 161 lb (73 kg)   Height: 5\' 8"  (1.727 m)   Body mass index is 24.48 kg/m.  Generalized: Well developed, in no acute distress  Chest: Lungs clear to auscultation bilaterally  Neurological examination  Mentation: Alert oriented to time, place, history taking. Follows all commands speech and language fluent Cranial nerve II-XII: Extraocular movements were full, visual field were full on confrontational test Head turning and shoulder shrug  were normal and symmetric. Motor: The motor testing reveals 5 over 5 strength of all 4 extremities. Good symmetric motor tone is noted throughout.  Sensory: Sensory testing is intact to soft touch on all 4 extremities. No evidence of extinction is noted.  Gait and station: Gait is normal.    DIAGNOSTIC DATA (LABS, IMAGING, TESTING) - I reviewed patient records, labs, notes, testing and imaging myself where available.  Lab Results  Component Value Date   WBC 13.5 (H) 02/04/2020   HGB 12.8 (L) 02/04/2020   HCT 40.6 02/04/2020   MCV 89.8 02/04/2020   PLT 336 02/04/2020      Component Value Date/Time   NA 141 09/03/2020 1048   NA 144 09/19/2019 1133   K 4.4 09/03/2020 1048   CL 108 09/03/2020 1048   CO2 28 09/03/2020 1048   GLUCOSE 89 09/03/2020 1048   BUN 16 09/03/2020 1048   BUN 16 09/19/2019 1133   CREATININE 1.41 (H) 09/03/2020 1048   CREATININE 1.34 (H) 03/09/2016 1045   CALCIUM 8.7 (L) 09/03/2020 1048   PROT 6.4 (L) 02/04/2020 0356   PROT 6.7 09/19/2019 1133   ALBUMIN 3.7 02/04/2020 0356   ALBUMIN 4.1 09/19/2019 1133   AST 18 02/04/2020 0356   ALT 16 02/04/2020 0356  ALKPHOS 75 02/04/2020 0356   BILITOT 0.7 02/04/2020 0356   BILITOT 0.6 09/19/2019 1133   GFRNONAA 48 (L) 09/03/2020 1048   GFRAA 50 (L) 02/04/2020 0356      ASSESSMENT AND PLAN 87 y.o. year old male  has a past medical history of Allergy, Asthma, BPH (benign prostatic hypertrophy), CKD (chronic kidney disease) stage 3, GFR 30-59 ml/min (HCC), Cough, Gout, Hypertensive chronic  kidney disease, Melanoma in situ of right eyelid ([place]), OSA (obstructive sleep apnea), and Persistent atrial fibrillation ([place]). here with:  1. OSA on CPAP  - CPAP noncompliant -Order sent to DME company for mask refitting particularly to look at his straps to see if there are better options for him -Also advised DME company to turn off heated tubing  - Encourage patient to use CPAP nightly and > 4 hours each night - F/U in 1 year or sooner if needed     Ward Givens, MSN, NP-C 09/10/2020, 9:47 AM [facility] [address] [name], [place] [address] [phone]

## 2020-09-10 NOTE — Patient Instructions (Signed)
Continue using CPAP nightly and greater than 4 hours each night Order sent for mask refitting If your symptoms worsen or you develop new symptoms please let us know.   

## 2020-09-17 ENCOUNTER — Telehealth (HOSPITAL_COMMUNITY): Payer: Self-pay | Admitting: *Deleted

## 2020-09-17 MED ORDER — AMLODIPINE BESYLATE 2.5 MG PO TABS: 2.5000 mg | ORAL_TABLET | Freq: Every day | ORAL | 3 refills | Status: DC

## 2020-09-17 NOTE — Telephone Encounter (Signed)
PT wife called in with BP readings over the last 2 weeks. Range from 122-199/82-117 most readings over 150/90.  Discussed with Roderic Palau NP will add amlodipine 2.5mg  once a day. Call with update in 10-14 days. Wife in agreement. Pt continues to feel well on lower dose coreg.

## 2020-10-06 DIAGNOSIS — L814 Other melanin hyperpigmentation: Secondary | ICD-10-CM | POA: Diagnosis not present

## 2020-10-06 DIAGNOSIS — L02414 Cutaneous abscess of left upper limb: Secondary | ICD-10-CM | POA: Diagnosis not present

## 2020-10-06 DIAGNOSIS — L821 Other seborrheic keratosis: Secondary | ICD-10-CM | POA: Diagnosis not present

## 2020-10-06 DIAGNOSIS — C44529 Squamous cell carcinoma of skin of other part of trunk: Secondary | ICD-10-CM | POA: Diagnosis not present

## 2020-10-06 DIAGNOSIS — L570 Actinic keratosis: Secondary | ICD-10-CM | POA: Diagnosis not present

## 2020-10-06 DIAGNOSIS — Z85828 Personal history of other malignant neoplasm of skin: Secondary | ICD-10-CM | POA: Diagnosis not present

## 2020-10-06 DIAGNOSIS — C44712 Basal cell carcinoma of skin of right lower limb, including hip: Secondary | ICD-10-CM | POA: Diagnosis not present

## 2020-10-21 ENCOUNTER — Other Ambulatory Visit: Payer: Self-pay | Admitting: Cardiology

## 2020-10-21 DIAGNOSIS — I10 Essential (primary) hypertension: Secondary | ICD-10-CM

## 2020-10-21 DIAGNOSIS — N183 Chronic kidney disease, stage 3 unspecified: Secondary | ICD-10-CM

## 2020-10-21 NOTE — Telephone Encounter (Signed)
This is a A-Fib clinic pt 

## 2020-11-09 DIAGNOSIS — Z9001 Acquired absence of eye: Secondary | ICD-10-CM | POA: Diagnosis not present

## 2020-11-09 DIAGNOSIS — Z8619 Personal history of other infectious and parasitic diseases: Secondary | ICD-10-CM | POA: Diagnosis not present

## 2020-11-09 DIAGNOSIS — Z961 Presence of intraocular lens: Secondary | ICD-10-CM | POA: Diagnosis not present

## 2020-11-09 DIAGNOSIS — C6901 Malignant neoplasm of right conjunctiva: Secondary | ICD-10-CM | POA: Diagnosis not present

## 2020-11-11 DIAGNOSIS — G4733 Obstructive sleep apnea (adult) (pediatric): Secondary | ICD-10-CM | POA: Diagnosis not present

## 2020-12-03 ENCOUNTER — Other Ambulatory Visit: Payer: Self-pay | Admitting: Cardiology

## 2020-12-03 NOTE — Telephone Encounter (Signed)
Xarelto 15mg  refill request received. Pt is 88 years old, weight-73kg, Crea-1.41 on 09/03/2020, last seen by Roderic Palau on 08/29/2020, Diagnosis-Afib, CrCl-38.70ml/min; Dose is appropriate based on dosing criteria. Will send in refill to requested pharmacy.

## 2020-12-05 ENCOUNTER — Emergency Department (HOSPITAL_COMMUNITY)
Admission: EM | Admit: 2020-12-05 | Discharge: 2020-12-05 | Disposition: A | Discharge status: Home / Self Care | Payer: Medicare PPO | Attending: Emergency Medicine | Admitting: Emergency Medicine

## 2020-12-05 ENCOUNTER — Other Ambulatory Visit: Payer: Self-pay

## 2020-12-05 ENCOUNTER — Encounter (HOSPITAL_COMMUNITY): Payer: Self-pay | Admitting: Emergency Medicine

## 2020-12-05 DIAGNOSIS — L539 Erythematous condition, unspecified: Secondary | ICD-10-CM | POA: Insufficient documentation

## 2020-12-05 DIAGNOSIS — I4821 Permanent atrial fibrillation: Secondary | ICD-10-CM | POA: Diagnosis not present

## 2020-12-05 DIAGNOSIS — J449 Chronic obstructive pulmonary disease, unspecified: Secondary | ICD-10-CM | POA: Diagnosis not present

## 2020-12-05 DIAGNOSIS — R42 Dizziness and giddiness: Secondary | ICD-10-CM | POA: Diagnosis not present

## 2020-12-05 DIAGNOSIS — N183 Chronic kidney disease, stage 3 unspecified: Secondary | ICD-10-CM | POA: Insufficient documentation

## 2020-12-05 DIAGNOSIS — J4530 Mild persistent asthma, uncomplicated: Secondary | ICD-10-CM | POA: Insufficient documentation

## 2020-12-05 DIAGNOSIS — I129 Hypertensive chronic kidney disease with stage 1 through stage 4 chronic kidney disease, or unspecified chronic kidney disease: Secondary | ICD-10-CM | POA: Diagnosis not present

## 2020-12-05 DIAGNOSIS — Z85840 Personal history of malignant neoplasm of eye: Secondary | ICD-10-CM | POA: Insufficient documentation

## 2020-12-05 DIAGNOSIS — T7840XA Allergy, unspecified, initial encounter: Secondary | ICD-10-CM

## 2020-12-05 DIAGNOSIS — I4892 Unspecified atrial flutter: Secondary | ICD-10-CM | POA: Diagnosis not present

## 2020-12-05 DIAGNOSIS — Z7901 Long term (current) use of anticoagulants: Secondary | ICD-10-CM | POA: Insufficient documentation

## 2020-12-05 DIAGNOSIS — T63441A Toxic effect of venom of bees, accidental (unintentional), initial encounter: Secondary | ICD-10-CM | POA: Insufficient documentation

## 2020-12-05 DIAGNOSIS — Z79899 Other long term (current) drug therapy: Secondary | ICD-10-CM | POA: Diagnosis not present

## 2020-12-05 DIAGNOSIS — Z86006 Personal history of melanoma in-situ: Secondary | ICD-10-CM | POA: Insufficient documentation

## 2020-12-05 DIAGNOSIS — I10 Essential (primary) hypertension: Secondary | ICD-10-CM | POA: Diagnosis not present

## 2020-12-05 MED ORDER — EPINEPHRINE 0.3 MG/0.3ML IJ SOAJ: 0.3000 mg | INTRAMUSCULAR | 0 refills | Status: AC | PRN

## 2020-12-05 MED ORDER — FAMOTIDINE IN NACL 20-0.9 MG/50ML-% IV SOLN
20.0000 mg | Freq: Once | INTRAVENOUS | Status: AC
Start: 2020-12-05 — End: 2020-12-05
  Administered 2020-12-05: 20 mg via INTRAVENOUS
  Filled 2020-12-05: qty 50

## 2020-12-05 MED ORDER — ACETAMINOPHEN 325 MG PO TABS
650.0000 mg | ORAL_TABLET | Freq: Once | ORAL | Status: AC
  Administered 2020-12-05: 650 mg via ORAL
  Filled 2020-12-05: qty 2

## 2020-12-05 MED ORDER — PREDNISONE 10 MG PO TABS: 40.0000 mg | ORAL_TABLET | Freq: Every day | ORAL | 0 refills | Status: AC

## 2020-12-05 MED ORDER — SODIUM CHLORIDE 0.9 % IV BOLUS
500.0000 mL | Freq: Once | INTRAVENOUS | Status: AC
  Administered 2020-12-05: 500 mL via INTRAVENOUS

## 2020-12-05 MED ORDER — DEXAMETHASONE SODIUM PHOSPHATE 10 MG/ML IJ SOLN
10.0000 mg | Freq: Once | INTRAMUSCULAR | Status: AC
Start: 2020-12-05 — End: 2020-12-05
  Administered 2020-12-05: 10 mg via INTRAVENOUS
  Filled 2020-12-05: qty 1

## 2020-12-05 NOTE — ED Triage Notes (Signed)
Patient was stung by about 6 bees, thinks he stepped on a hive / hole in the ground. Wife gave IM epi. Hx of lip, nose, and tongue swelling in the past from stings. Took 25 mg Benadryl before arrival as well. States he does not feel like he is swelling, denies trouble breathing currently.

## 2020-12-05 NOTE — ED Provider Notes (Addendum)
[facility] DEPT Provider Note   CSN: ID:[id number] Arrival date & time: 12/05/20  1013     History Chief Complaint  Patient presents with   Allergic Reaction    Austin [name] is a 87 y.o. male with a hx of CKD, OSA, COPD, & persistent atrial fibrillation who presents to the ED with his wife for evaluation of allergic reaction S/p bee stings shortly PTA.  Patient states he accidentally must have stepped on a hive or hole with a bees nest in it as he was subsequently stung 6 times to the RUE/RLE extremity and buttocks area. These areas are sore. Given his hx of anaphylaxis his wife promptly gave him an epi pen shot @ 10:15, however this was expired, he also took 25 mg of benadryl PTA. Patient states he currently feels a bit dizzy and his bee stings are sore, but no other complaints. No alleviating/aggravating factors. Denies facial swelling, sensation of throat closing, dyspnea, nausea, vomiting, or syncope.   HPI     Past Medical History:  Diagnosis Date   Allergy    Asthma    BPH (benign prostatic hypertrophy)    CKD (chronic kidney disease) stage 3, GFR 30-59 ml/min (HCC)    Cough    Gout    Hypertensive chronic kidney disease    Melanoma in situ of right eyelid ([place])    OSA (obstructive sleep apnea)    Persistent atrial fibrillation [facility])     Patient Active Problem List   Diagnosis Date Noted   Dyspnea on exertion 09/19/2019   Atrial fibrillation and flutter ([place]) 03/08/2018   OSA on CPAP 03/08/2018   Atrial fibrillation, persistent ([place]) 08/10/2015   Atrial fibrillation ([place]) 07/29/2015   CPAP use counseling 07/29/2015   OSA and COPD overlap syndrome ([place]) 07/29/2015   Essential hypertension 06/22/2015   CKD (chronic kidney disease) 06/22/2015   Persistent atrial fibrillation (HCC)    Gasping for breath 03/19/2015   Snoring 03/19/2015   Asthma, mild persistent 03/19/2015   COPD exacerbation ([place]) 03/19/2015   FUO (fever of unknown  origin) 03/19/2013   Melanoma in situ of right eyelid ([place])    Neurotrophic keratoconjunctivitis 06/01/2011   Malignant neoplasm of conjunctiva ([place]) 03/02/2011   Trichiasis of eyelid 03/02/2011    Past Surgical History:  Procedure Laterality Date   CARDIOVERSION [name] 06/05/2015   Procedure: CARDIOVERSION;  Surgeon: Sanda Klein, MD;  Location: [facility];  Service: Cardiovascular;  Laterality: [name];   EYE SURGERY  12/2017       Family History  Problem Relation Age of Onset   Hypertension Father    Lung cancer Father    Hypertension Mother     Social History   Tobacco Use   Smoking status: Never   Smokeless tobacco: Never  Substance Use Topics   Alcohol use: Yes    Alcohol/week: 1.0 standard drink    Types: 1 Standard drinks or equivalent per week    Comment: occ   Drug use: No    Home Medications Prior to Admission medications   Medication Sig Start Date End Date Taking? Authorizing Provider  acetaminophen (TYLENOL) 500 MG tablet Take 500 mg by mouth every 6 (six) hours as needed.     [provider]  albuterol (VENTOLIN HFA) 108 (90 Base) MCG/ACT inhaler Inhale 2 puffs into the lungs every 4 (four) hours as needed for wheezing or shortness of breath (cough).    [provider]  amLODipine (NORVASC) 2.5 MG tablet  Take 1 tablet (2.5 mg total) by mouth daily. 09/17/20 12/16/20  Sherran Needs, NP  budesonide-formoterol (SYMBICORT) 80-4.5 MCG/ACT inhaler Inhale 2 puffs into the lungs 2 (two) times daily.     [provider]  butalbital-acetaminophen-caffeine (FIORICET, ESGIC) 50-325-40 MG tablet Take 1 tablet by mouth every 4 (four) hours as needed (severe headache). Maximum 4 tablets per day 06/15/15   [provider]  carvedilol (COREG) 12.5 MG tablet Take 1 tablet (12.5 mg total) by mouth 2 (two) times daily. 10/21/20   Sherran Needs, NP  clobetasol cream (TEMOVATE) [id number] % Apply 1 application topically daily as needed (for itching).   03/03/14   [provider]  dofetilide (TIKOSYN) 125 MCG capsule TAKE 1 CAPSULE (125 MCG TOTAL) BY MOUTH 2 (TWO) TIMES DAILY. 08/12/20   Sherran Needs, NP  EPINEPHrine 0.15 MG/0.15ML IJ injection Inject 0.15 mg into the muscle as needed for anaphylaxis.    [provider]  fexofenadine (ALLEGRA) 60 MG tablet Take 60 mg by mouth every evening.     [provider]  fluticasone (FLONASE) 50 MCG/ACT nasal spray Place 2 sprays into both nostrils at bedtime.  02/23/15   [provider]  furosemide (LASIX) 20 MG tablet Take 1 tablet (20 mg total) by mouth daily. 10/21/20   Sherran Needs, NP  indomethacin (INDOCIN) 50 MG capsule Take 50 mg by mouth as needed (for gout flare up).    [provider]  isosorbide mononitrate (IMDUR) 60 MG 24 hr tablet TAKE 1 TABLET BY MOUTH EVERY DAY 05/05/20   Camnitz, Ocie Doyne, MD  KLOR-CON M20 20 MEQ tablet TAKE 1 TABLET BY MOUTH EVERY DAY 04/20/20   Richardson Dopp T, PA-C  lidocaine-prilocaine (EMLA) cream Apply topically as needed.  12/27/11   [provider]  MAGNESIUM CITRATE PO Take 250 mg by mouth at bedtime.    [provider]  olmesartan (BENICAR) 40 MG tablet TAKE 1 TABLET BY MOUTH EVERY DAY. *REPLACES LOSARTAN 08/12/20   Sherran Needs, NP  Polyethyl Glycol-Propyl Glycol 0.4-0.3 % SOLN Place 1 drop into both eyes 2 (two) times daily.    [provider]  Probiotic Product ([place]) CAPS Take 1 capsule by mouth daily.    [provider]  promethazine (PHENERGAN) 25 MG tablet Take by mouth. 02/04/20   [provider]  sertraline (ZOLOFT) 50 MG tablet Take 50 mg by mouth daily.  02/12/20   [provider]  triamcinolone (NASACORT) 55 MCG/ACT AERO nasal inhaler Place 2 sprays into the nose 2 (two) times daily.    [provider]  valACYclovir (VALTREX) 1000 MG tablet Take 1,000 mg by mouth daily.    [provider]  XARELTO 15 MG TABS  tablet TAKE 1 TABLET BY MOUTH EVERY DAY WITH SUPPER 12/03/20   Sherran Needs, NP  zolpidem (AMBIEN) 10 MG tablet Take 10 mg by mouth at bedtime as needed for sleep.  02/27/15   [provider]    Allergies    Bee venom, Diltiazem, Codeine, Ivp dye [iodinated diagnostic agents], Penicillins, and Tape  Review of Systems   Review of Systems  Constitutional:  Negative for chills and fever.  HENT:  Negative for facial swelling, trouble swallowing and voice change.   Respiratory:  Negative for cough and shortness of breath.   Cardiovascular:  Negative for chest pain.  Gastrointestinal:  Negative for abdominal pain, nausea and vomiting.  Skin:  Positive for wound.  Neurological:  Positive for dizziness.  All other systems reviewed and are negative.  Physical Exam Updated Vital Signs BP 126/87 (BP Location: Right Arm)   Pulse (!) 54   Temp (!) 97 F (36.1 C) (Oral)   Resp 19   Ht '5\' 8"'$  (1.727 m)   Wt 73 kg   SpO2 96%   BMI 24.47 kg/m   Physical Exam Vitals and nursing note reviewed.  Constitutional:      General: He is not in acute distress.    Appearance: He is well-developed. He is not toxic-appearing.  HENT:     Head: Normocephalic and atraumatic.     Mouth/Throat:     Mouth: No angioedema.     Pharynx: Oropharynx is clear. Uvula midline. No uvula swelling.     Comments: Posterior oropharynx is symmetric appearing. Patient tolerating own secretions without difficulty. No trismus. No drooling. No hot potato voice. No swelling beneath the tongue, submandibular compartment is soft.  Airways patent. Eyes:     Comments: Right eye S/p surgical intervention.   Cardiovascular:     Rate and Rhythm: Normal rate.  Pulmonary:     Effort: Pulmonary effort is normal. No respiratory distress.     Breath sounds: Normal breath sounds. No stridor. No wheezing, rhonchi or rales.  Abdominal:     General: There is no distension.     Palpations: Abdomen is soft.     Tenderness:  There is no abdominal tenderness.  Musculoskeletal:     Cervical back: Neck supple.  Skin:    General: Skin is warm and dry.     Findings: No rash.     Comments: There are scattered small areas of erythema to the right upper and lower extremity consistent with reported bee stings.  No urticarial rash noted.  Neurological:     Mental Status: He is alert.     Comments: Clear speech.   Psychiatric:        Behavior: Behavior normal.  . ED Results / Procedures / Treatments   Labs (all labs ordered are listed, but only abnormal results are displayed) Labs Reviewed - No data to display  EKG EKG Interpretation  Date/Time:  Saturday December 05 2020 11:17:28 EDT Ventricular Rate:  62 PR Interval:    QRS Duration: 100 QT Interval:  459 QTC Calculation: 467 R Axis:   -72 Text Interpretation: Atrial flutter with predominant 4:1 AV block Probable inferior infarct, age indeterminate Probable anterior infarct, old Confirmed by Dene Gentry [phone]) on 12/05/2020 11:47:24 AM  Radiology No results found.  Procedures Procedures   Medications Ordered in ED Medications  dexamethasone (DECADRON) injection 10 mg (10 mg Intravenous Given 12/05/20 1149)  famotidine (PEPCID) IVPB 20 mg premix (20 mg Intravenous New Bag/Given 12/05/20 1152)  sodium chloride 0.9 % bolus 500 mL (500 mLs Intravenous New Bag/Given 12/05/20 1152)  acetaminophen (TYLENOL) tablet 650 mg (650 mg Oral Given 12/05/20 1248)    ED Course  I have reviewed the triage vital signs and the nursing notes.  Pertinent labs & imaging results that were available during my care of the patient were reviewed by me and considered in my medical decision making (see chart for details).    MDM Rules/Calculators/A&P                          Patient presents to the ED with complaints of allergic reaction after multiple bee stings.  Patient is nontoxic, resting comfortably, initial vitals within normal  limits.  Patient is without findings of  angioedema, stridor, or respiratory distress.  He is protecting his airway.  He has already received epinephrine and Benadryl, will place IV and give Decadron and Pepcid with small fluid bolus given he feels a bit dizzy.  We will also obtain an EKG.  Additional history obtained:  Additional history obtained from chart review & nursing note review.   EKG with atrial flutter, patient is in permanent A. fib.  ED Course:  Patient observed in the emergency department, other than some discomfort at his sting sites he continues to feel well.  He remains with no signs of angioedema, urticaria, or airway compromise.  Overall appears appropriate for discharge home. Findings & plan of care discussed w/ attending Dr. Francia Greaves- recommends discharge with epi pen and a few days of steroids. I discussed results, treatment plan, need for follow-up, and return precautions with the patient. Provided opportunity for questions, patient confirmed understanding and is in agreement with plan.   This is a shared visit with supervising physician Dr. Francia Greaves who has independently evaluated patient & provided guidance in evaluation/management/disposition, in agreement with care   Portions of this note were generated with Dragon dictation software. Dictation errors may occur despite best attempts at proofreading.   Final Clinical Impression(s) / ED Diagnoses Final diagnoses:  Allergic reaction, initial encounter    Rx / DC Orders ED Discharge Orders          Ordered    EPINEPHrine 0.3 mg/0.3 mL IJ SOAJ injection  As needed        12/05/20 1300    predniSONE (DELTASONE) 10 MG tablet  Daily        12/05/20 1300             Prayan Ulin, [place] R, PA-C 12/05/20 1348    Amaryllis Dyke, PA-C 12/05/20 1417    Valarie Merino, MD 12/05/20 1506

## 2020-12-05 NOTE — Discharge Instructions (Addendum)
You were seen in the Er today for an allergic reaction.  We are sending you home with an epi pen to use as needed.  Please take prednisone for the next 3 days starting tomorrow morning- you received a dose of steroids in the ER.   Take tylenol as needed for discomfort and benadryl as needed for itching.   Follow up with primary care within 3 days. Return to the Er for new or worsening symptoms including but not limited to shortness of breath, facial swelling, dizziness, passing out, inability to keep fluids down, new or worsening pain, needing to use your epi pen or any other concerns.

## 2020-12-12 ENCOUNTER — Other Ambulatory Visit: Payer: Self-pay

## 2020-12-12 ENCOUNTER — Encounter (HOSPITAL_BASED_OUTPATIENT_CLINIC_OR_DEPARTMENT_OTHER): Payer: Self-pay

## 2020-12-12 ENCOUNTER — Emergency Department (HOSPITAL_BASED_OUTPATIENT_CLINIC_OR_DEPARTMENT_OTHER): Payer: Medicare PPO

## 2020-12-12 ENCOUNTER — Emergency Department (HOSPITAL_BASED_OUTPATIENT_CLINIC_OR_DEPARTMENT_OTHER)
Admission: EM | Admit: 2020-12-12 | Discharge: 2020-12-12 | Disposition: A | Discharge status: Home / Self Care | Payer: Medicare PPO | Attending: Emergency Medicine | Admitting: Emergency Medicine

## 2020-12-12 DIAGNOSIS — N183 Chronic kidney disease, stage 3 unspecified: Secondary | ICD-10-CM | POA: Insufficient documentation

## 2020-12-12 DIAGNOSIS — Z79899 Other long term (current) drug therapy: Secondary | ICD-10-CM | POA: Insufficient documentation

## 2020-12-12 DIAGNOSIS — K4020 Bilateral inguinal hernia, without obstruction or gangrene, not specified as recurrent: Secondary | ICD-10-CM | POA: Diagnosis not present

## 2020-12-12 DIAGNOSIS — K5900 Constipation, unspecified: Secondary | ICD-10-CM | POA: Insufficient documentation

## 2020-12-12 DIAGNOSIS — Z7951 Long term (current) use of inhaled steroids: Secondary | ICD-10-CM | POA: Insufficient documentation

## 2020-12-12 DIAGNOSIS — R350 Frequency of micturition: Secondary | ICD-10-CM | POA: Insufficient documentation

## 2020-12-12 DIAGNOSIS — R109 Unspecified abdominal pain: Secondary | ICD-10-CM

## 2020-12-12 DIAGNOSIS — I129 Hypertensive chronic kidney disease with stage 1 through stage 4 chronic kidney disease, or unspecified chronic kidney disease: Secondary | ICD-10-CM | POA: Diagnosis not present

## 2020-12-12 DIAGNOSIS — Z85840 Personal history of malignant neoplasm of eye: Secondary | ICD-10-CM | POA: Diagnosis not present

## 2020-12-12 DIAGNOSIS — Z20822 Contact with and (suspected) exposure to covid-19: Secondary | ICD-10-CM | POA: Diagnosis not present

## 2020-12-12 DIAGNOSIS — K921 Melena: Secondary | ICD-10-CM | POA: Insufficient documentation

## 2020-12-12 DIAGNOSIS — K8689 Other specified diseases of pancreas: Secondary | ICD-10-CM | POA: Diagnosis not present

## 2020-12-12 DIAGNOSIS — J441 Chronic obstructive pulmonary disease with (acute) exacerbation: Secondary | ICD-10-CM | POA: Insufficient documentation

## 2020-12-12 DIAGNOSIS — R1032 Left lower quadrant pain: Secondary | ICD-10-CM | POA: Diagnosis not present

## 2020-12-12 DIAGNOSIS — J4530 Mild persistent asthma, uncomplicated: Secondary | ICD-10-CM | POA: Insufficient documentation

## 2020-12-12 DIAGNOSIS — M47814 Spondylosis without myelopathy or radiculopathy, thoracic region: Secondary | ICD-10-CM | POA: Diagnosis not present

## 2020-12-12 DIAGNOSIS — I739 Peripheral vascular disease, unspecified: Secondary | ICD-10-CM | POA: Diagnosis not present

## 2020-12-12 DIAGNOSIS — I10 Essential (primary) hypertension: Secondary | ICD-10-CM | POA: Diagnosis not present

## 2020-12-12 LAB — COMPREHENSIVE METABOLIC PANEL
ALT: 22 U/L (ref 0–44)
AST: 21 U/L (ref 15–41)
Albumin: 3.8 g/dL (ref 3.5–5.0)
Alkaline Phosphatase: 73 U/L (ref 38–126)
Anion gap: 8 (ref 5–15)
BUN: 21 mg/dL (ref 8–23)
CO2: 27 mmol/L (ref 22–32)
Calcium: 8.4 mg/dL — ABNORMAL LOW (ref 8.9–10.3)
Chloride: 102 mmol/L (ref 98–111)
Creatinine, Ser: 1.33 mg/dL — ABNORMAL HIGH (ref 0.61–1.24)
GFR, Estimated: 52 mL/min — ABNORMAL LOW (ref >60)
Glucose, Bld: 117 mg/dL — ABNORMAL HIGH (ref 70–99)
Potassium: 3.9 mmol/L (ref 3.5–5.1)
Sodium: 137 mmol/L (ref 135–145)
Total Bilirubin: 0.7 mg/dL (ref 0.3–1.2)
Total Protein: 6.5 g/dL (ref 6.5–8.1)

## 2020-12-12 LAB — CBC WITH DIFFERENTIAL/PLATELET
Abs Immature Granulocytes: 0.12 10*3/uL — ABNORMAL HIGH (ref 0.00–0.07)
Basophils Absolute: 0 10*3/uL (ref 0.0–0.1)
Basophils Relative: 0 %
Eosinophils Absolute: 0.2 10*3/uL (ref 0.0–0.5)
Eosinophils Relative: 2 %
HCT: 41.6 % (ref 39.0–52.0)
Hemoglobin: 13 g/dL (ref 13.0–17.0)
Immature Granulocytes: 1 %
Lymphocytes Relative: 22 %
Lymphs Abs: 2.7 10*3/uL (ref 0.7–4.0)
MCH: 25.6 pg — ABNORMAL LOW (ref 26.0–34.0)
MCHC: 31.3 g/dL (ref 30.0–36.0)
MCV: 82.1 fL (ref 80.0–100.0)
Monocytes Absolute: 1.3 10*3/uL — ABNORMAL HIGH (ref 0.1–1.0)
Monocytes Relative: 10 %
Neutro Abs: 8.2 10*3/uL — ABNORMAL HIGH (ref 1.7–7.7)
Neutrophils Relative %: 65 %
Platelets: 377 10*3/uL (ref 150–400)
RBC: 5.07 MIL/uL (ref 4.22–5.81)
RDW: 17.5 % — ABNORMAL HIGH (ref 11.5–15.5)
WBC: 12.5 10*3/uL — ABNORMAL HIGH (ref 4.0–10.5)
nRBC: 0 % (ref 0.0–0.2)

## 2020-12-12 LAB — URINALYSIS, ROUTINE W REFLEX MICROSCOPIC
Bilirubin Urine: NEGATIVE
Glucose, UA: NEGATIVE mg/dL
Ketones, ur: NEGATIVE mg/dL
Leukocytes,Ua: NEGATIVE
Nitrite: NEGATIVE
Protein, ur: NEGATIVE mg/dL
Specific Gravity, Urine: 1.01 (ref 1.005–1.030)
pH: 6 (ref 5.0–8.0)

## 2020-12-12 LAB — LIPASE, BLOOD: Lipase: 39 U/L (ref 11–51)

## 2020-12-12 LAB — RESP PANEL BY RT-PCR (FLU A&B, COVID) ARPGX2
Influenza A by PCR: NEGATIVE
Influenza B by PCR: NEGATIVE
SARS Coronavirus 2 by RT PCR: NEGATIVE

## 2020-12-12 LAB — OCCULT BLOOD X 1 CARD TO LAB, STOOL: Fecal Occult Bld: NEGATIVE

## 2020-12-12 LAB — URINALYSIS, MICROSCOPIC (REFLEX)

## 2020-12-12 MED ORDER — PANTOPRAZOLE SODIUM 40 MG IV SOLR
40.0000 mg | INTRAVENOUS | Status: AC
  Administered 2020-12-12 (×2): 40 mg via INTRAVENOUS
  Filled 2020-12-12 (×2): qty 40

## 2020-12-12 MED ORDER — PANTOPRAZOLE 80MG IVPB - SIMPLE MED: 80.0000 mg | Freq: Once | INTRAVENOUS | Status: DC

## 2020-12-12 MED ORDER — ACETAMINOPHEN 325 MG PO TABS
650.0000 mg | ORAL_TABLET | Freq: Once | ORAL | Status: AC
  Administered 2020-12-12: 650 mg via ORAL
  Filled 2020-12-12: qty 2

## 2020-12-12 NOTE — ED Provider Notes (Signed)
[place] EMERGENCY DEPARTMENT Provider Note   CSN: [id number] Arrival date & time: 12/12/20  [id number]     History Chief Complaint  Patient presents with   Austin Pain    With back pain that started last night, passing black tarry stools as well.    Austin [name] is a 87 y.o. male.  Austin [name] presents with left-sided Austin pain and dark stools.  He has had difficulty having bowel movements and also urinating and has been treating his symptoms with laxatives with little response.  He does take Xarelto.  The history is provided by the patient.  Austin Pain Pain location:  L flank Pain quality: stabbing   Pain radiates to:  LLQ Pain severity:  Moderate Onset quality:  Sudden Duration:  1 day Timing:  Constant Progression:  Unchanged Chronicity:  New Context: not sick contacts and not trauma   Relieved by:  Nothing Worsened by:  Nothing Ineffective treatments: laxatives. Associated symptoms: constipation, melena and nausea   Associated symptoms: no chest pain, no chills, no cough, no dysuria, no fever, no hematuria, no shortness of breath, no sore throat and no vomiting       Past Medical History:  Diagnosis Date   Allergy    Asthma    BPH (benign prostatic hypertrophy)    CKD (chronic kidney disease) stage 3, GFR 30-59 ml/min (HCC)    Cough    Gout    Hypertensive chronic kidney disease    Melanoma in situ of right eyelid (HCC)    OSA (obstructive sleep apnea)    Persistent atrial fibrillation [facility])     Patient Active Problem List   Diagnosis Date Noted   Dyspnea on exertion 09/19/2019   Atrial fibrillation and flutter ([place]) 03/08/2018   OSA on CPAP 03/08/2018   Atrial fibrillation, persistent ([place]) 08/10/2015   Atrial fibrillation ([place]) 07/29/2015   CPAP use counseling 07/29/2015   OSA and COPD overlap syndrome ([place]) 07/29/2015   Essential hypertension 06/22/2015   CKD (chronic kidney disease) 06/22/2015   Persistent atrial  fibrillation (HCC)    Gasping for breath 03/19/2015   Snoring 03/19/2015   Asthma, mild persistent 03/19/2015   COPD exacerbation ([place]) 03/19/2015   FUO (fever of unknown origin) 03/19/2013   Melanoma in situ of right eyelid ([place])    Neurotrophic keratoconjunctivitis 06/01/2011   Malignant neoplasm of conjunctiva ([place]) 03/02/2011   Trichiasis of eyelid 03/02/2011    Past Surgical History:  Procedure Laterality Date   CARDIOVERSION [name] 06/05/2015   Procedure: CARDIOVERSION;  Surgeon: Sanda Klein, MD;  Location: [facility] ENDOSCOPY;  Service: Cardiovascular;  Laterality: [name];   EYE SURGERY  12/2017       Family History  Problem Relation Age of Onset   Hypertension Father    Lung cancer Father    Hypertension Mother     Social History   Tobacco Use   Smoking status: Never   Smokeless tobacco: Never  Substance Use Topics   Alcohol use: Yes    Alcohol/week: 1.0 standard drink    Types: 1 Standard drinks or equivalent per week    Comment: occ   Drug use: No    Home Medications Prior to Admission medications   Medication Sig Start Date End Date Taking? Authorizing Provider  acetaminophen (TYLENOL) 500 MG tablet Take 500 mg by mouth every 6 (six) hours as needed.     [provider]  albuterol (VENTOLIN HFA) 108 (90 Base) MCG/ACT inhaler Inhale 2 puffs into  the lungs every 4 (four) hours as needed for wheezing or shortness of breath (cough).    [provider]  amLODipine (NORVASC) 2.5 MG tablet Take 1 tablet (2.5 mg total) by mouth daily. 09/17/20 12/16/20  Sherran Needs, NP  budesonide-formoterol (SYMBICORT) 80-4.5 MCG/ACT inhaler Inhale 2 puffs into the lungs 2 (two) times daily.     [provider]  butalbital-acetaminophen-caffeine (FIORICET, ESGIC) 50-325-40 MG tablet Take 1 tablet by mouth every 4 (four) hours as needed (severe headache). Maximum 4 tablets per day 06/15/15   [provider]  Butalbital-APAP-Caffeine 50-300-40 MG CAPS Take 1  tablet by mouth every 4 (four) hours as needed. 08/11/20   [provider]  carvedilol (COREG) 12.5 MG tablet Take 1 tablet (12.5 mg total) by mouth 2 (two) times daily. 10/21/20   Sherran Needs, NP  clobetasol cream (TEMOVATE) [id number] % Apply 1 application topically daily as needed (for itching).  03/03/14   [provider]  dofetilide (TIKOSYN) 125 MCG capsule TAKE 1 CAPSULE (125 MCG TOTAL) BY MOUTH 2 (TWO) TIMES DAILY. 08/12/20   Sherran Needs, NP  EPINEPHrine 0.3 mg/0.3 mL IJ SOAJ injection Inject 0.3 mg into the muscle as needed for anaphylaxis. 12/05/20   Petrucelli, Samantha R, PA-C  fexofenadine (ALLEGRA) 60 MG tablet Take 60 mg by mouth every evening.     [provider]  fluticasone (FLONASE) 50 MCG/ACT nasal spray Place 2 sprays into both nostrils at bedtime.  02/23/15   [provider]  furosemide (LASIX) 20 MG tablet Take 1 tablet (20 mg total) by mouth daily. 10/21/20   Sherran Needs, NP  hydrOXYzine (ATARAX/VISTARIL) 25 MG tablet Take 50 mg by mouth at bedtime as needed. 10/14/20   [provider]  indomethacin (INDOCIN) 50 MG capsule Take 50 mg by mouth as needed (for gout flare up).    [provider]  isosorbide mononitrate (IMDUR) 60 MG 24 hr tablet TAKE 1 TABLET BY MOUTH EVERY DAY 05/05/20   Camnitz, Ocie Doyne, MD  KLOR-CON M20 20 MEQ tablet TAKE 1 TABLET BY MOUTH EVERY DAY 04/20/20   Richardson Dopp T, PA-C  lidocaine-prilocaine (EMLA) cream Apply topically as needed.  12/27/11   [provider]  MAGNESIUM CITRATE PO Take 250 mg by mouth at bedtime.    [provider]  olmesartan (BENICAR) 40 MG tablet TAKE 1 TABLET BY MOUTH EVERY DAY. *REPLACES LOSARTAN 08/12/20   Sherran Needs, NP  Polyethyl Glycol-Propyl Glycol 0.4-0.3 % SOLN Place 1 drop into both eyes 2 (two) times daily.    [provider]  Probiotic Product ([place]) CAPS Take 1 capsule by mouth daily.    [provider]   promethazine (PHENERGAN) 25 MG tablet Take by mouth. 02/04/20   [provider]  sertraline (ZOLOFT) 50 MG tablet Take 50 mg by mouth daily.  02/12/20   [provider]  triamcinolone (NASACORT) 55 MCG/ACT AERO nasal inhaler Place 2 sprays into the nose 2 (two) times daily.    [provider]  valACYclovir (VALTREX) 1000 MG tablet Take 1,000 mg by mouth daily.    [provider]  XARELTO 15 MG TABS tablet TAKE 1 TABLET BY MOUTH EVERY DAY WITH SUPPER 12/03/20   Sherran Needs, NP  zolpidem (AMBIEN) 10 MG tablet Take 10 mg by mouth at bedtime as needed for sleep.  02/27/15   [provider]    Allergies    Bee venom, Diltiazem, Codeine, Ivp dye [  iodinated diagnostic agents], Penicillins, and Tape  Review of Systems   Review of Systems  Constitutional:  Negative for chills and fever.  HENT:  Negative for ear pain and sore throat.   Eyes:  Negative for pain and visual disturbance.  Respiratory:  Negative for cough and shortness of breath.   Cardiovascular:  Negative for chest pain and palpitations.  Gastrointestinal:  Positive for Austin pain, constipation, melena and nausea. Negative for vomiting.  Genitourinary:  Positive for difficulty urinating. Negative for dysuria and hematuria.  Musculoskeletal:  Negative for arthralgias and back pain.  Skin:  Negative for color change and rash.  Neurological:  Negative for seizures and syncope.  All other systems reviewed and are negative.  Physical Exam Updated Vital Signs Ht '5\' 8"'$  (1.727 m)   Wt 72.6 kg   BMI 24.33 kg/m   Physical Exam Vitals and nursing note reviewed.  Constitutional:      Appearance: Normal appearance.  HENT:     Head: Normocephalic and atraumatic.  Cardiovascular:     Rate and Rhythm: Normal rate and regular rhythm.     Heart sounds: Normal heart sounds.  Pulmonary:     Effort: Pulmonary effort is normal.     Breath sounds: Normal breath sounds.  Austin:      General: There is no distension.     Tenderness: There is Austin tenderness in the left lower quadrant. There is no guarding or rebound.  Genitourinary:    Comments: Rectum normal but exam consistent with melenic stool Musculoskeletal:     Cervical back: Normal range of motion.     Right lower leg: No edema.     Left lower leg: No edema.  Skin:    General: Skin is warm and dry.  Neurological:     General: No focal deficit present.     Mental Status: He is alert and oriented to person, place, and time.  Psychiatric:        Mood and Affect: Mood normal.        Behavior: Behavior normal.    ED Results / Procedures / Treatments   Labs (all labs ordered are listed, but only abnormal results are displayed) Labs Reviewed  CBC WITH DIFFERENTIAL/PLATELET - Abnormal; Notable for the following components:      Result Value   WBC 12.5 (*)    MCH 25.6 (*)    RDW 17.5 (*)    Neutro Abs 8.2 (*)    Monocytes Absolute 1.3 (*)    Abs Immature Granulocytes 0.12 (*)    All other components within normal limits  COMPREHENSIVE METABOLIC PANEL - Abnormal; Notable for the following components:   Glucose, Bld 117 (*)    Creatinine, Ser 1.33 (*)    Calcium 8.4 (*)    GFR, Estimated 52 (*)    All other components within normal limits  URINALYSIS, ROUTINE W REFLEX MICROSCOPIC - Abnormal; Notable for the following components:   Hgb urine dipstick TRACE (*)    All other components within normal limits  URINALYSIS, MICROSCOPIC (REFLEX) - Abnormal; Notable for the following components:   Bacteria, UA RARE (*)    All other components within normal limits  RESP PANEL BY RT-PCR (FLU A&B, COVID) ARPGX2  URINE CULTURE  LIPASE, BLOOD  OCCULT BLOOD X 1 CARD TO LAB, STOOL  POC OCCULT BLOOD, ED    EKG EKG Interpretation  Date/Time:  Saturday December 12 2020 09:17:19 EDT Ventricular Rate:  69 PR Interval:    QRS Duration:  103 QT Interval:  431 QTC Calculation: 462 R Axis:   -69 Text  Interpretation: Atrial fibrillation LAD, consider LAFB or inferior infarct Abnormal R-wave progression, late transition Probable left ventricular hypertrophy similar to prior Confirmed by Lorre Munroe ([id number]) on 12/12/2020 9:34:19 AM  Radiology CT RENAL STONE STUDY  Result Date: 12/12/2020 CLINICAL DATA:  Left back pain radiating to the abdomen. Black tarry stools. EXAM: CT ABDOMEN AND PELVIS WITHOUT CONTRAST TECHNIQUE: Multidetector CT imaging of the abdomen and pelvis was performed following the standard protocol without IV contrast. COMPARISON:  CT abdomen pelvis 03/14/2019. FINDINGS: Lower chest: Normal heart size. Dependent atelectasis/scarring within the bilateral lower lobes. No pleural effusion. Hepatobiliary: Liver is normal in size and contour. Prior cholecystectomy. Pancreas: Fatty atrophy of the pancreatic parenchyma. Spleen: Unremarkable Adrenals/Urinary Tract: Normal adrenal glands. Similar-appearing 4 cm fluid attenuation structure off the posterosuperior left kidney most compatible with a renal cyst. Urinary bladder is unremarkable. Stomach/Bowel: No abnormal bowel wall thickening or evidence for bowel obstruction. No free fluid or free intraperitoneal air. Normal morphology of the stomach. Vascular/Lymphatic: Tortuous yet normal caliber Austin aorta. Peripheral calcified atherosclerotic plaque. No retroperitoneal lymphadenopathy. Reproductive: Heterogeneous prostate. Other: Small bilateral fat containing inguinal hernias. Musculoskeletal: Lower thoracic and lumbar spine degenerative changes. No aggressive or acute appearing osseous lesions. IMPRESSION: No acute process within the abdomen or pelvis. Electronically Signed   By: Lovey Newcomer M.D.   On: 12/12/2020 10:21    Procedures Procedures   Medications Ordered in ED Medications  pantoprazole (PROTONIX) 80 mg /NS 100 mL IVPB (has no administration in time range)    ED Course  I have reviewed the triage vital signs and the nursing  notes.  Pertinent labs & imaging results that were available during my care of the patient were reviewed by me and considered in my medical decision making (see chart for details).    MDM Rules/Calculators/A&P                           Austin [name] scented to the emergency department with left-sided flank pain radiating to his left lower quadrant and dark stool.  He was also complaining of frequent urination.  He stated that he felt like he had to have a bowel movement, but when he tried, he only urinated.  Vital signs were within normal limits.  ED evaluation has so far been unremarkable for the exact source of his symptoms.  Fortunately, his stool was heme-negative.  He is not anemic.  We talked about alternative pathologies including musculoskeletal pathologies, shingles, or an evolving process not captured on diagnostic imaging today.  He was given careful return precautions.  I did send a culture on his urine for scant bacteria.   Final Clinical Impression(s) / ED Diagnoses Final diagnoses:  Flank pain  Constipation, unspecified constipation type    Rx / DC Orders ED Discharge Orders     None        Arnaldo Natal, MD 12/12/20 1120

## 2020-12-12 NOTE — ED Triage Notes (Signed)
Left side back pain that radiates to abdomen, black tarry stools that started last night.

## 2020-12-12 NOTE — ED Notes (Signed)
Patient transported to CT 

## 2020-12-12 NOTE — ED Notes (Signed)
Pt discharged to home. Discharge instructions have been discussed with patient and/or family members. Pt verbally acknowledges understanding d/c instructions, and endorses comprehension to checkout at registration before leaving.  °

## 2020-12-13 ENCOUNTER — Other Ambulatory Visit (HOSPITAL_COMMUNITY): Payer: Self-pay | Admitting: Nurse Practitioner

## 2020-12-13 LAB — URINE CULTURE: Culture: <10000 — AB

## 2020-12-15 DIAGNOSIS — R109 Unspecified abdominal pain: Secondary | ICD-10-CM | POA: Diagnosis not present

## 2020-12-15 DIAGNOSIS — J449 Chronic obstructive pulmonary disease, unspecified: Secondary | ICD-10-CM | POA: Diagnosis not present

## 2020-12-15 DIAGNOSIS — R531 Weakness: Secondary | ICD-10-CM | POA: Diagnosis not present

## 2020-12-15 DIAGNOSIS — R140 Abdominal distension (gaseous): Secondary | ICD-10-CM | POA: Diagnosis not present

## 2020-12-15 DIAGNOSIS — N281 Cyst of kidney, acquired: Secondary | ICD-10-CM | POA: Diagnosis not present

## 2020-12-15 DIAGNOSIS — R197 Diarrhea, unspecified: Secondary | ICD-10-CM | POA: Diagnosis not present

## 2020-12-15 DIAGNOSIS — E860 Dehydration: Secondary | ICD-10-CM | POA: Diagnosis not present

## 2020-12-15 DIAGNOSIS — R1084 Generalized abdominal pain: Secondary | ICD-10-CM | POA: Diagnosis not present

## 2020-12-15 DIAGNOSIS — I10 Essential (primary) hypertension: Secondary | ICD-10-CM | POA: Diagnosis not present

## 2020-12-21 DIAGNOSIS — M545 Low back pain, unspecified: Secondary | ICD-10-CM | POA: Diagnosis not present

## 2020-12-21 DIAGNOSIS — R195 Other fecal abnormalities: Secondary | ICD-10-CM | POA: Diagnosis not present

## 2020-12-21 DIAGNOSIS — D72829 Elevated white blood cell count, unspecified: Secondary | ICD-10-CM | POA: Diagnosis not present

## 2020-12-21 DIAGNOSIS — R1032 Left lower quadrant pain: Secondary | ICD-10-CM | POA: Diagnosis not present

## 2020-12-21 DIAGNOSIS — N1831 Chronic kidney disease, stage 3a: Secondary | ICD-10-CM | POA: Diagnosis not present

## 2020-12-21 DIAGNOSIS — I4891 Unspecified atrial fibrillation: Secondary | ICD-10-CM | POA: Diagnosis not present

## 2020-12-21 DIAGNOSIS — I129 Hypertensive chronic kidney disease with stage 1 through stage 4 chronic kidney disease, or unspecified chronic kidney disease: Secondary | ICD-10-CM | POA: Diagnosis not present

## 2020-12-30 ENCOUNTER — Encounter (HOSPITAL_COMMUNITY): Payer: Self-pay | Admitting: Nurse Practitioner

## 2020-12-30 ENCOUNTER — Other Ambulatory Visit (HOSPITAL_COMMUNITY): Payer: Self-pay | Admitting: Nurse Practitioner

## 2020-12-30 ENCOUNTER — Ambulatory Visit (HOSPITAL_COMMUNITY)
Admission: RE | Admit: 2020-12-30 | Discharge: 2020-12-30 | Disposition: A | Discharge status: Home / Self Care | Payer: Medicare PPO | Source: Ambulatory Visit | Attending: Nurse Practitioner | Admitting: Nurse Practitioner

## 2020-12-30 ENCOUNTER — Other Ambulatory Visit: Payer: Self-pay

## 2020-12-30 VITALS — BP 122/72 | HR 66 | Ht 68.0 in | Wt 158.6 lb

## 2020-12-30 DIAGNOSIS — Z885 Allergy status to narcotic agent status: Secondary | ICD-10-CM | POA: Insufficient documentation

## 2020-12-30 DIAGNOSIS — Z888 Allergy status to other drugs, medicaments and biological substances status: Secondary | ICD-10-CM | POA: Insufficient documentation

## 2020-12-30 DIAGNOSIS — Z79899 Other long term (current) drug therapy: Secondary | ICD-10-CM | POA: Insufficient documentation

## 2020-12-30 DIAGNOSIS — D6869 Other thrombophilia: Secondary | ICD-10-CM | POA: Diagnosis not present

## 2020-12-30 DIAGNOSIS — Z85820 Personal history of malignant melanoma of skin: Secondary | ICD-10-CM | POA: Insufficient documentation

## 2020-12-30 DIAGNOSIS — Z7901 Long term (current) use of anticoagulants: Secondary | ICD-10-CM | POA: Diagnosis not present

## 2020-12-30 DIAGNOSIS — Z8249 Family history of ischemic heart disease and other diseases of the circulatory system: Secondary | ICD-10-CM | POA: Diagnosis not present

## 2020-12-30 DIAGNOSIS — Z880 Allergy status to penicillin: Secondary | ICD-10-CM | POA: Diagnosis not present

## 2020-12-30 DIAGNOSIS — I4819 Other persistent atrial fibrillation: Secondary | ICD-10-CM | POA: Diagnosis not present

## 2020-12-30 LAB — CBC
HCT: 38.2 % — ABNORMAL LOW (ref 39.0–52.0)
Hemoglobin: 11.6 g/dL — ABNORMAL LOW (ref 13.0–17.0)
MCH: 26.2 pg (ref 26.0–34.0)
MCHC: 30.4 g/dL (ref 30.0–36.0)
MCV: 86.2 fL (ref 80.0–100.0)
Platelets: 338 10*3/uL (ref 150–400)
RBC: 4.43 MIL/uL (ref 4.22–5.81)
RDW: 18.5 % — ABNORMAL HIGH (ref 11.5–15.5)
WBC: 9.3 10*3/uL (ref 4.0–10.5)
nRBC: 0 % (ref 0.0–0.2)

## 2020-12-30 LAB — MAGNESIUM: Magnesium: 2.2 mg/dL (ref 1.7–2.4)

## 2020-12-30 LAB — BASIC METABOLIC PANEL
Anion gap: 7 (ref 5–15)
BUN: 17 mg/dL (ref 8–23)
CO2: 26 mmol/L (ref 22–32)
Calcium: 8.7 mg/dL — ABNORMAL LOW (ref 8.9–10.3)
Chloride: 107 mmol/L (ref 98–111)
Creatinine, Ser: 1.39 mg/dL — ABNORMAL HIGH (ref 0.61–1.24)
GFR, Estimated: 49 mL/min — ABNORMAL LOW (ref >60)
Glucose, Bld: 108 mg/dL — ABNORMAL HIGH (ref 70–99)
Potassium: 4.5 mmol/L (ref 3.5–5.1)
Sodium: 140 mmol/L (ref 135–145)

## 2020-12-30 NOTE — H&P (View-Only) (Signed)
Patient ID: Austin [name], male   DOB: [birth year], 87 y.o.   MRN: [id number]     Primary Care Physician: Austin Bunting, MD Referring Physician: [facility] f/u EP: Austin [name] is a 87 y.o. male with a h/o persistent afib that failed flecainide, amiodarone and was admitted and loaded on tikosyn 3/27 thru 3/30. Renal function and electrolytes were followed during the hospitalization. QTc had prolongation and required down-titration of  Tiksoyn with improvement and stable QTc, his EKG reviewed by Austin [name].  He converted to sinus rhythm 08/12/15.  He was monitored until discharge on telemetry which demonstrated SR.  F/u in afib clinic today, 09/24/19.He was recently seen by Austin Kilts, PA at the [facility] office and he was in rhythm. QTc was 495 ms. There was a question if Tikosyn needed to be stopped for  qt concerns and changed  to amiodarone. He is on lowest dose of tikosyn at 125 mcg bid.  In the office here today, his qtc is 455 ms and he is in rhythm. He does not want change in therapy. He did try amiodarone briefly at one time, since he only has one eye, he was concerned that amiodarone would negatively effect his vision.  He continues on xarelto 15 mg daily for a CHA2DS2VASc of 3.   F/u in afib clinic, 8/25,he continues on Tikosyn and is maintaining SR. He is pending having his rt eye removed for ongoing pain/ha's with h/o of melanoma/shingles and  infection in the eye. He was advised to hold xarelto x 3 days. Qtc is stable.   F/u in afib clinic, 04/13/20. He is here for surveillance of Tikosyn use. He is not having any afib with Tikosyn use. He is pending more eye surgery Wednesday for melanoma found inside the socket when  eye was removed late summer. He is off DOAC today and will restart Wednesday pm. Qtc is stable.   F/u in afib clinic, 08/19/20. He is reporting fatigue and shortness of breath. Saw his PCP this last week was very weak and had a systolic BP in the 99991111.  PCP felt he may have a UTI and started on antibiotics and also felt that he may be mildly dehydrated. He is staying in [name]. His HR though is 51 bpm and maybe bradycardia from BB may be contributing to symptoms. If this  does not help will need to discuss obtaining  a stress test. Last stress test was in 2017 did not show any ischemia. He reports that at the beach recently he did have shortness of breath carrying items up the stairs that was much worse that in previous years.   F/u in afib clinic, 12/30/20.pt is here for his usual tikosyn surveillance visit but is found to be out of rhythm  Ekg shows that he is out of rhythm in afib at 66 bpm. He has not felt up to par recently since stepping on a hornet's nest 7/23 with known allergy to bee stings. The wife gave him an epi pen and he proceeded to the ER. Ekg then showed afib in the 60's. He was treated with prednisone and d/c home. He then went back to the ER 8/2 with abdominal pain. The cause of this ws thought to be constipation from prednisone. EKG at that visit afib in the 60's. He then went to the beach and got very week. He was treated at [facility] with dehydration. He was not admitted and I do not see  where an EKG was performed. Cardioversion was discussed and he would like to proceed as he is still feeling weaker than usual. No missed anticoagulation. He remains on Tikosyn 125 mcg bid.  Today, he denies symptoms of palpitations, chest pain, shortness of breath, orthopnea, PND, lower extremity edema, dizziness, presyncope, syncope, or neurologic sequela. The patient is tolerating medications without difficulties and is otherwise without complaint today.   Past Medical History:  Diagnosis Date   Allergy    Asthma    BPH (benign prostatic hypertrophy)    CKD (chronic kidney disease) stage 3, GFR 30-59 ml/min (HCC)    Cough    Gout    Hypertensive chronic kidney disease    Melanoma in situ of right eyelid (HCC)    OSA (obstructive sleep  apnea)    Persistent atrial fibrillation ([name])    Past Surgical History:  Procedure Laterality Date   CARDIOVERSION [name] 06/05/2015   Procedure: CARDIOVERSION;  Surgeon: Sanda Klein, MD;  Location: [place];  Service: Cardiovascular;  Laterality: [name];   EYE SURGERY  12/2017    Current Outpatient Medications  Medication Sig Dispense Refill   acetaminophen (TYLENOL) 500 MG tablet Take 500 mg by mouth every 6 (six) hours as needed (pain.).     albuterol (VENTOLIN HFA) 108 (90 Base) MCG/ACT inhaler Inhale 2 puffs into the lungs every 4 (four) hours as needed for wheezing or shortness of breath (cough).     amLODipine (NORVASC) 2.5 MG tablet TAKE 1 TABLET BY MOUTH EVERY DAY (Patient taking differently: Take 2.5 mg by mouth daily with lunch.) 90 tablet 1   budesonide-formoterol (SYMBICORT) 80-4.5 MCG/ACT inhaler Inhale 2 puffs into the lungs 2 (two) times daily.      carvedilol (COREG) 12.5 MG tablet Take 1 tablet (12.5 mg total) by mouth 2 (two) times daily. 180 tablet 1   clobetasol cream (TEMOVATE) [id number] % Apply 1 application topically 2 (two) times daily as needed (for itching).     dofetilide (TIKOSYN) 125 MCG capsule TAKE 1 CAPSULE (125 MCG TOTAL) BY MOUTH 2 (TWO) TIMES DAILY. 180 capsule 3   EPINEPHrine 0.3 mg/0.3 mL IJ SOAJ injection Inject 0.3 mg into the muscle as needed for anaphylaxis. 1 each 0   fexofenadine (ALLEGRA) 60 MG tablet Take 60 mg by mouth daily.     fluticasone (FLONASE) 50 MCG/ACT nasal spray Place 2 sprays into both nostrils in the morning and at bedtime.  8   furosemide (LASIX) 20 MG tablet Take 1 tablet (20 mg total) by mouth daily. 90 tablet 1   hydrOXYzine (ATARAX/VISTARIL) 25 MG tablet Take 50 mg by mouth at bedtime.     indomethacin (INDOCIN) 50 MG capsule Take 50 mg by mouth as needed (for gout flare up).     isosorbide mononitrate (IMDUR) 60 MG 24 hr tablet TAKE 1 TABLET BY MOUTH EVERY DAY 90 tablet 2   KLOR-CON M20 20 MEQ tablet TAKE 1 TABLET BY MOUTH EVERY  DAY 90 tablet 3   lidocaine-prilocaine (EMLA) cream Apply 1 application topically as needed (itching).     MAGNESIUM CITRATE PO Take 250 mg by mouth at bedtime.     olmesartan (BENICAR) 40 MG tablet TAKE 1 TABLET BY MOUTH EVERY DAY. *REPLACES LOSARTAN 90 tablet 2   Polyethyl Glycol-Propyl Glycol 0.4-0.3 % SOLN Place 1 drop into the left eye 2 (two) times daily.     Probiotic Product ([place]) CAPS Take 1 capsule by mouth daily.     promethazine (PHENERGAN) 25  MG tablet Take 25 mg by mouth every 6 (six) hours as needed for nausea or vomiting.     sertraline (ZOLOFT) 50 MG tablet Take 50 mg by mouth at bedtime.     triamcinolone (NASACORT) 55 MCG/ACT AERO nasal inhaler Place 2 sprays into the nose 2 (two) times daily.     valACYclovir (VALTREX) 1000 MG tablet Take 1,000 mg by mouth at bedtime.     XARELTO 15 MG TABS tablet TAKE 1 TABLET BY MOUTH EVERY DAY WITH SUPPER 90 tablet 1   zolpidem (AMBIEN) 10 MG tablet Take 10 mg by mouth at bedtime as needed for sleep.   5   Butalbital-APAP-Caff-Cod 50-300-40-30 MG CAPS Take 1 capsule by mouth every 4 (four) hours as needed (migraines).     DM-APAP-CPM (CORICIDIN HBP) 10-325-2 MG TABS Take 1-2 tablets by mouth 3 (three) times daily as needed (cold symptoms).     guaiFENesin (MUCINEX) 600 MG 12 hr tablet Take 600 mg by mouth in the morning.     No current facility-administered medications for this encounter.    Allergies  Allergen Reactions   Bee Venom Anaphylaxis   Diltiazem Other (See Comments)    Severe headaches 120 mg BID   Codeine Nausea And Vomiting   Ivp Dye [Iodinated Diagnostic Agents] Rash   Penicillins Swelling and Rash    Has patient had a PCN reaction causing immediate rash, facial/tongue/throat swelling, SOB or lightheadedness with hypotension: Yes Has patient had a PCN reaction causing severe rash involving mucus membranes or skin necrosis: No Has patient had a PCN reaction that required hospitalization No Has  patient had a PCN reaction occurring within the last 10 years: No If all of the above answers are "NO", then may proceed with Cephalosporin use.   Tape Itching and Rash    Redness.  Please use "paper" tape only    Social History   Socioeconomic History   Marital status: Married    Spouse name: [name]   Number of children: Not on file   Years of education: Not on file   Highest education level: Not on file  Occupational History   Not on file  Tobacco Use   Smoking status: Never   Smokeless tobacco: Never  Substance and Sexual Activity   Alcohol use: Yes    Alcohol/week: 1.0 standard drink    Types: 1 Standard drinks or equivalent per week    Comment: occ   Drug use: No   Sexual activity: Not on file  Other Topics Concern   Not on file  Social History Narrative   Right Handed.  Caffiene  Coffee, tea 3 cups daily.   Pt retired.     Lives at home with wife.     Social Determinants of Health   Financial Resource Strain: Not on file  Food Insecurity: Not on file  Transportation Needs: Not on file  Physical Activity: Not on file  Stress: Not on file  Social Connections: Not on file  Intimate Partner Violence: Not on file    Family History  Problem Relation Age of Onset   Hypertension Father    Lung cancer Father    Hypertension Mother     ROS- All systems are reviewed and negative except as per the HPI above  Physical Exam: Vitals:   12/30/20 1034  BP: 122/72  Pulse: 66  Weight: 71.9 kg  Height: '5\' 8"'$  (1.727 m)    GEN- The patient is well appearing, alert and oriented x 3  today.   Head- normocephalic, atraumatic Eyes-  Sclera clear, conjunctiva pink. Blind rt eye Ears- hearing intact Oropharynx- clear Neck- supple, no JVP Lymph- no cervical lymphadenopathy Lungs- Clear to ausculation bilaterally, normal work of breathing Heart- Regular rate and rhythm, no murmurs, rubs or gallops, PMI not laterally displaced GI- soft, NT, ND, + BS Extremities- no  clubbing, cyanosis, or edema MS- no significant deformity or atrophy Skin- no rash or lesion Psych- euthymic mood, full affect Neuro- strength and sensation are intact  EKG- sinus brady at 51 bpm, with first degree AV block,  pr int 214 ms, qrs int 104 ms, 458 ms (stable)  Epic records reviewed  2021-Echo- . Left ventricular ejection fraction, by estimation, is 60 to 65%. The left ventricle has normal function. The left ventricle has no regional wall motion abnormalities. There is severe left ventricular hypertrophy. Left ventricular diastolic parameters are consistent with Grade II diastolic dysfunction (pseudonormalization). Elevated left atrial pressure. 2. Right ventricular systolic function is mildly reduced. The right ventricular size is normal. There is normal pulmonary artery systolic pressure. 3. The mitral valve is normal in structure. Trivial mitral valve regurgitation. 4. The aortic valve was not well visualized. Aortic valve regurgitation is mild. Mild aortic valve stenosis. Vmax 2.3 m/s, Mg 9 mmHg, AVA 1.3 cm^2, DI 0.43 5. Aortic dilatation noted. There is mild dilatation of the aortic root measuring 40 mm. Mild dilatation of the ascending aorta measuring 41m 6. Left atrial size was mildly dilated. 7. Severe asymmetric hypertrophy of basal septum. Also with apical hypertrophy with cavity obliteration during systole. Consider cardiac MRI to evaluate for hypertrophic cardiomyopathy or amyloidosis.  2017-Nuclear stress EF: 55%. There was no ST segment deviation noted during stress. Patient was in atrial fibrillation throughout the study. There is a small defect of mild severity present in the basal inferolateral and mid inferolateral location. The defect is non-reversible. There is a small defect of mild severity present in the basal inferior and mid inferior location. The defect is non-reversible.There is a small defect of mild severity present in the apex location. The defect  is non-reversible. No ischemia noted. This is a low risk study. The left ventricular ejection fraction is normal (55-65%).    Assessment and Plan: 1. Persistent afib Has been maintaining SR on Tikosyn  and appears to have possibly gotten out of rhythm at time of bee stings end of July  Qt  within normal range at 480 ms  Will plan for CV, risks vrs benefit discussed  Continue Tikosyn 125 mg bid Bmet/mag/cbc today   2. CHA2DS2VASc score  of 3 Continue xarelto 15 mg qd States no missed doses for at least the last 3 weeks    F/u here in  one week after cardioversion    DButch PennyC. Hindy Perrault, AStanley Hospital[address]GPembine [place] [phone]

## 2020-12-30 NOTE — Progress Notes (Signed)
Patient ID: Austin [name], male   DOB: 1930-11-26, 87 y.o.   MRN: [id number]     Primary Care Physician: Burnard Bunting, MD Referring Physician: [facility] f/u EP: Dr. Reggy Eye Austin [name] is a 87 y.o. male with a h/o persistent afib that failed flecainide, amiodarone and was admitted and loaded on tikosyn 3/27 thru 3/30. Renal function and electrolytes were followed during the hospitalization. QTc had prolongation and required down-titration of  Tiksoyn with improvement and stable QTc, his EKG reviewed by Dr. Curt Bears.  He converted to sinus rhythm 08/12/15.  He was monitored until discharge on telemetry which demonstrated SR.  F/u in afib clinic today, 09/24/19.He was recently seen by [name], PA at the [facility] office and he was in rhythm. QTc was 495 ms. There was a question if Tikosyn needed to be stopped for  qt concerns and changed  to amiodarone. He is on lowest dose of tikosyn at 125 mcg bid.  In the office here today, his qtc is 455 ms and he is in rhythm. He does not want change in therapy. He did try amiodarone briefly at one time, since he only has one eye, he was concerned that amiodarone would negatively effect his vision.  He continues on xarelto 15 mg daily for a CHA2DS2VASc of 3.   F/u in afib clinic, 8/25,he continues on Tikosyn and is maintaining SR. He is pending having his rt eye removed for ongoing pain/ha's with h/o of melanoma/shingles and  infection in the eye. He was advised to hold xarelto x 3 days. Qtc is stable.   F/u in afib clinic, 04/13/20. He is here for surveillance of Tikosyn use. He is not having any afib with Tikosyn use. He is pending more eye surgery Wednesday for melanoma found inside the socket when  eye was removed late summer. He is off DOAC today and will restart Wednesday pm. Qtc is stable.   F/u in afib clinic, 08/19/20. He is reporting fatigue and shortness of breath. Saw his PCP this last week was very weak and had a systolic BP in the [id number].  PCP felt he may have a UTI and started on antibiotics and also felt that he may be mildly dehydrated. He is staying in [place]. His HR though is 51 bpm and maybe bradycardia from BB may be contributing to symptoms. If this  does not help will need to discuss obtaining  a stress test. Last stress test was in 2017 did not show any ischemia. He reports that at the beach recently he did have shortness of breath carrying items up the stairs that was much worse that in previous years.   F/u in afib clinic, 12/30/20.pt is here for his usual tikosyn surveillance visit but is found to be out of rhythm  Ekg shows that he is out of rhythm in afib at 66 bpm. He has not felt up to par recently since stepping on a hornet's nest 7/23 with known allergy to bee stings. The wife gave him an epi pen and he proceeded to the ER. Ekg then showed afib in the 60's. He was treated with prednisone and d/c home. He then went back to the ER 8/2 with abdominal pain. The cause of this ws thought to be constipation from prednisone. EKG at that visit afib in the 60's. He then went to the beach and got very week. He was treated at [facility] with dehydration. He was not admitted and I do not see  where an EKG was performed. Cardioversion was discussed and he would like to proceed as he is still feeling weaker than usual. No missed anticoagulation. He remains on Tikosyn 125 mcg bid.  Today, he denies symptoms of palpitations, chest pain, shortness of breath, orthopnea, PND, lower extremity edema, dizziness, presyncope, syncope, or neurologic sequela. The patient is tolerating medications without difficulties and is otherwise without complaint today.   Past Medical History:  Diagnosis Date   Allergy    Asthma    BPH (benign prostatic hypertrophy)    CKD (chronic kidney disease) stage 3, GFR 30-59 ml/min (HCC)    Cough    Gout    Hypertensive chronic kidney disease    Melanoma in situ of right eyelid (HCC)    OSA (obstructive sleep  apnea)    Persistent atrial fibrillation ([place])    Past Surgical History:  Procedure Laterality Date   CARDIOVERSION [name] 06/05/2015   Procedure: CARDIOVERSION;  Surgeon: Sanda Klein, MD;  Location: [facility];  Service: Cardiovascular;  Laterality: [name];   EYE SURGERY  12/2017    Current Outpatient Medications  Medication Sig Dispense Refill   acetaminophen (TYLENOL) 500 MG tablet Take 500 mg by mouth every 6 (six) hours as needed (pain.).     albuterol (VENTOLIN HFA) 108 (90 Base) MCG/ACT inhaler Inhale 2 puffs into the lungs every 4 (four) hours as needed for wheezing or shortness of breath (cough).     amLODipine (NORVASC) 2.5 MG tablet TAKE 1 TABLET BY MOUTH EVERY DAY (Patient taking differently: Take 2.5 mg by mouth daily with lunch.) 90 tablet 1   budesonide-formoterol (SYMBICORT) 80-4.5 MCG/ACT inhaler Inhale 2 puffs into the lungs 2 (two) times daily.      carvedilol (COREG) 12.5 MG tablet Take 1 tablet (12.5 mg total) by mouth 2 (two) times daily. 180 tablet 1   clobetasol cream (TEMOVATE) [id number] % Apply 1 application topically 2 (two) times daily as needed (for itching).     dofetilide (TIKOSYN) 125 MCG capsule TAKE 1 CAPSULE (125 MCG TOTAL) BY MOUTH 2 (TWO) TIMES DAILY. 180 capsule 3   EPINEPHrine 0.3 mg/0.3 mL IJ SOAJ injection Inject 0.3 mg into the muscle as needed for anaphylaxis. 1 each 0   fexofenadine (ALLEGRA) 60 MG tablet Take 60 mg by mouth daily.     fluticasone (FLONASE) 50 MCG/ACT nasal spray Place 2 sprays into both nostrils in the morning and at bedtime.  8   furosemide (LASIX) 20 MG tablet Take 1 tablet (20 mg total) by mouth daily. 90 tablet 1   hydrOXYzine (ATARAX/VISTARIL) 25 MG tablet Take 50 mg by mouth at bedtime.     indomethacin (INDOCIN) 50 MG capsule Take 50 mg by mouth as needed (for gout flare up).     isosorbide mononitrate (IMDUR) 60 MG 24 hr tablet TAKE 1 TABLET BY MOUTH EVERY DAY 90 tablet 2   KLOR-CON M20 20 MEQ tablet TAKE 1 TABLET BY MOUTH EVERY  DAY 90 tablet 3   lidocaine-prilocaine (EMLA) cream Apply 1 application topically as needed (itching).     MAGNESIUM CITRATE PO Take 250 mg by mouth at bedtime.     olmesartan (BENICAR) 40 MG tablet TAKE 1 TABLET BY MOUTH EVERY DAY. *REPLACES LOSARTAN 90 tablet 2   Polyethyl Glycol-Propyl Glycol 0.4-0.3 % SOLN Place 1 drop into the left eye 2 (two) times daily.     Probiotic Product ([place]) CAPS Take 1 capsule by mouth daily.     promethazine (PHENERGAN) 25  MG tablet Take 25 mg by mouth every 6 (six) hours as needed for nausea or vomiting.     sertraline (ZOLOFT) 50 MG tablet Take 50 mg by mouth at bedtime.     triamcinolone (NASACORT) 55 MCG/ACT AERO nasal inhaler Place 2 sprays into the nose 2 (two) times daily.     valACYclovir (VALTREX) 1000 MG tablet Take 1,000 mg by mouth at bedtime.     XARELTO 15 MG TABS tablet TAKE 1 TABLET BY MOUTH EVERY DAY WITH SUPPER 90 tablet 1   zolpidem (AMBIEN) 10 MG tablet Take 10 mg by mouth at bedtime as needed for sleep.   5   Butalbital-APAP-Caff-Cod 50-300-40-30 MG CAPS Take 1 capsule by mouth every 4 (four) hours as needed (migraines).     DM-APAP-CPM (CORICIDIN HBP) 10-325-2 MG TABS Take 1-2 tablets by mouth 3 (three) times daily as needed (cold symptoms).     guaiFENesin (MUCINEX) 600 MG 12 hr tablet Take 600 mg by mouth in the morning.     No current facility-administered medications for this encounter.    Allergies  Allergen Reactions   Bee Venom Anaphylaxis   Diltiazem Other (See Comments)    Severe headaches 120 mg BID   Codeine Nausea And Vomiting   Ivp Dye [Iodinated Diagnostic Agents] Rash   Penicillins Swelling and Rash    Has patient had a PCN reaction causing immediate rash, facial/tongue/throat swelling, SOB or lightheadedness with hypotension: Yes Has patient had a PCN reaction causing severe rash involving mucus membranes or skin necrosis: No Has patient had a PCN reaction that required hospitalization No Has  patient had a PCN reaction occurring within the last 10 years: No If all of the above answers are "NO", then may proceed with Cephalosporin use.   Tape Itching and Rash    Redness.  Please use "paper" tape only    Social History   Socioeconomic History   Marital status: Married    Spouse name: [name]   Number of children: Not on file   Years of education: Not on file   Highest education level: Not on file  Occupational History   Not on file  Tobacco Use   Smoking status: Never   Smokeless tobacco: Never  Substance and Sexual Activity   Alcohol use: Yes    Alcohol/week: 1.0 standard drink    Types: 1 Standard drinks or equivalent per week    Comment: occ   Drug use: No   Sexual activity: Not on file  Other Topics Concern   Not on file  Social History Narrative   Right Handed.  Caffiene  Coffee, tea 3 cups daily.   Pt retired.     Lives at home with wife.     Social Determinants of Health   Financial Resource Strain: Not on file  Food Insecurity: Not on file  Transportation Needs: Not on file  Physical Activity: Not on file  Stress: Not on file  Social Connections: Not on file  Intimate Partner Violence: Not on file    Family History  Problem Relation Age of Onset   Hypertension Father    Lung cancer Father    Hypertension Mother     ROS- All systems are reviewed and negative except as per the HPI above  Physical Exam: Vitals:   12/30/20 1034  BP: 122/72  Pulse: 66  Weight: 71.9 kg  Height: '5\' 8"'$  (1.727 m)    GEN- The patient is well appearing, alert and oriented x 3  today.   Head- normocephalic, atraumatic Eyes-  Sclera clear, conjunctiva pink. Blind rt eye Ears- hearing intact Oropharynx- clear Neck- supple, no JVP Lymph- no cervical lymphadenopathy Lungs- Clear to ausculation bilaterally, normal work of breathing Heart- Regular rate and rhythm, no murmurs, rubs or gallops, PMI not laterally displaced GI- soft, NT, ND, + BS Extremities- no  clubbing, cyanosis, or edema MS- no significant deformity or atrophy Skin- no rash or lesion Psych- euthymic mood, full affect Neuro- strength and sensation are intact  EKG- sinus brady at 51 bpm, with first degree AV block,  pr int 214 ms, qrs int 104 ms, 458 ms (stable)  Epic records reviewed  2021-Echo- . Left ventricular ejection fraction, by estimation, is 60 to 65%. The left ventricle has normal function. The left ventricle has no regional wall motion abnormalities. There is severe left ventricular hypertrophy. Left ventricular diastolic parameters are consistent with Grade II diastolic dysfunction (pseudonormalization). Elevated left atrial pressure. 2. Right ventricular systolic function is mildly reduced. The right ventricular size is normal. There is normal pulmonary artery systolic pressure. 3. The mitral valve is normal in structure. Trivial mitral valve regurgitation. 4. The aortic valve was not well visualized. Aortic valve regurgitation is mild. Mild aortic valve stenosis. Vmax 2.3 m/s, Mg 9 mmHg, AVA 1.3 cm^2, DI 0.43 5. Aortic dilatation noted. There is mild dilatation of the aortic root measuring 40 mm. Mild dilatation of the ascending aorta measuring 63m 6. Left atrial size was mildly dilated. 7. Severe asymmetric hypertrophy of basal septum. Also with apical hypertrophy with cavity obliteration during systole. Consider cardiac MRI to evaluate for hypertrophic cardiomyopathy or amyloidosis.  2017-Nuclear stress EF: 55%. There was no ST segment deviation noted during stress. Patient was in atrial fibrillation throughout the study. There is a small defect of mild severity present in the basal inferolateral and mid inferolateral location. The defect is non-reversible. There is a small defect of mild severity present in the basal inferior and mid inferior location. The defect is non-reversible.There is a small defect of mild severity present in the apex location. The defect  is non-reversible. No ischemia noted. This is a low risk study. The left ventricular ejection fraction is normal (55-65%).    Assessment and Plan: 1. Persistent afib Has been maintaining SR on Tikosyn  and appears to have possibly gotten out of rhythm at time of bee stings end of July  Qt  within normal range at 480 ms  Will plan for CV, risks vrs benefit discussed  Continue Tikosyn 125 mg bid Bmet/mag/cbc today   2. CHA2DS2VASc score  of 3 Continue xarelto 15 mg qd States no missed doses for at least the last 3 weeks    F/u here in  one week after cardioversion    DButch PennyC. Malacai Grantz, AHuntsville Hospital18 Thompson StreetGLakeview North E[place] [phone]

## 2020-12-30 NOTE — Patient Instructions (Signed)
Cardioversion scheduled for August 22nd 2022  - Arrive at the [facility] and go to admitting at [facility] not eat or drink anything after midnight the night prior to your procedure.  - Take all your morning medication (except diabetic medications) with a sip of water prior to arrival.  - You will not be able to drive home after your procedure.  - Do NOT miss any doses of your blood thinner - if you should miss a dose please notify our office immediately.  - If you feel as if you go back into normal rhythm prior to scheduled cardioversion, please notify our office immediately. If your procedure is canceled in the cardioversion suite you will be charged a cancellation fee.  Patients will be asked to: to mask in public and hand hygiene (no longer quarantine) in the 3 days prior to surgery, to report if any COVID-19-like illness or household contacts to COVID-19 to determine need for testing

## 2021-01-04 ENCOUNTER — Encounter (HOSPITAL_COMMUNITY)
Admission: RE | Disposition: A | Discharge status: Home / Self Care | Payer: Self-pay | Source: Home / Self Care | Attending: Internal Medicine

## 2021-01-04 ENCOUNTER — Ambulatory Visit (HOSPITAL_COMMUNITY): Payer: Medicare PPO | Admitting: Anesthesiology

## 2021-01-04 ENCOUNTER — Ambulatory Visit (HOSPITAL_COMMUNITY)
Admission: RE | Admit: 2021-01-04 | Discharge: 2021-01-04 | Disposition: A | Discharge status: Home / Self Care | Payer: Medicare PPO | Attending: Internal Medicine | Admitting: Internal Medicine

## 2021-01-04 DIAGNOSIS — Z880 Allergy status to penicillin: Secondary | ICD-10-CM | POA: Insufficient documentation

## 2021-01-04 DIAGNOSIS — Z888 Allergy status to other drugs, medicaments and biological substances status: Secondary | ICD-10-CM | POA: Diagnosis not present

## 2021-01-04 DIAGNOSIS — I4892 Unspecified atrial flutter: Secondary | ICD-10-CM | POA: Diagnosis not present

## 2021-01-04 DIAGNOSIS — Z79899 Other long term (current) drug therapy: Secondary | ICD-10-CM | POA: Diagnosis not present

## 2021-01-04 DIAGNOSIS — Z885 Allergy status to narcotic agent status: Secondary | ICD-10-CM | POA: Insufficient documentation

## 2021-01-04 DIAGNOSIS — I4819 Other persistent atrial fibrillation: Secondary | ICD-10-CM | POA: Diagnosis not present

## 2021-01-04 DIAGNOSIS — Z7901 Long term (current) use of anticoagulants: Secondary | ICD-10-CM | POA: Insufficient documentation

## 2021-01-04 DIAGNOSIS — Z91030 Bee allergy status: Secondary | ICD-10-CM | POA: Diagnosis not present

## 2021-01-04 DIAGNOSIS — Z9989 Dependence on other enabling machines and devices: Secondary | ICD-10-CM | POA: Diagnosis not present

## 2021-01-04 DIAGNOSIS — Z91041 Radiographic dye allergy status: Secondary | ICD-10-CM | POA: Diagnosis not present

## 2021-01-04 DIAGNOSIS — G4733 Obstructive sleep apnea (adult) (pediatric): Secondary | ICD-10-CM | POA: Diagnosis not present

## 2021-01-04 HISTORY — PX: CARDIOVERSION: SHX1299

## 2021-01-04 SURGERY — CARDIOVERSION
Anesthesia: General

## 2021-01-04 MED ORDER — SODIUM CHLORIDE 0.9 % IV SOLN: INTRAVENOUS | Status: DC

## 2021-01-04 MED ORDER — PROPOFOL 10 MG/ML IV BOLUS
INTRAVENOUS | Status: DC | PRN
Start: 2021-01-04 — End: 2021-01-04
  Administered 2021-01-04: 50 mg via INTRAVENOUS

## 2021-01-04 MED ORDER — LIDOCAINE 2% (20 MG/ML) 5 ML SYRINGE
INTRAMUSCULAR | Status: DC | PRN
  Administered 2021-01-04: 60 mg via INTRAVENOUS

## 2021-01-04 NOTE — Anesthesia Postprocedure Evaluation (Signed)
Anesthesia Post Note  Patient: Austin [name]  Procedure(s) Performed: CARDIOVERSION     Patient location during evaluation: Endoscopy Anesthesia Type: General Level of consciousness: awake and alert Pain management: pain level controlled Vital Signs Assessment: post-procedure vital signs reviewed and stable Respiratory status: spontaneous breathing, nonlabored ventilation and respiratory function stable Cardiovascular status: blood pressure returned to baseline and stable Postop Assessment: no apparent nausea or vomiting Anesthetic complications: no   No notable events documented.  Last Vitals:  Vitals:   01/04/21 1002 01/04/21 1012  BP: 123/68 133/84  Pulse: (!) 50 (!) 53  Resp: 15 14  Temp:    SpO2: 95% 96%    Last Pain:  Vitals:   01/04/21 1012  TempSrc:   PainSc: 0-No pain                 Kailer Heindel,W. EDMOND

## 2021-01-04 NOTE — Anesthesia Preprocedure Evaluation (Addendum)
Anesthesia Evaluation  Patient identified by MRN, date of birth, ID band Patient awake    Reviewed: Allergy & Precautions, H&P , NPO status , Patient's Chart, lab work & pertinent test results  Airway Mallampati: I  TM Distance: >3 FB Neck ROM: Full    Dental no notable dental hx. (+) Teeth Intact, Dental Advisory Given   Pulmonary asthma , sleep apnea , COPD,  COPD inhaler,    Pulmonary exam normal breath sounds clear to auscultation       Cardiovascular hypertension, Pt. on medications  Rhythm:Regular Rate:Normal     Neuro/Psych negative neurological ROS  negative psych ROS   GI/Hepatic negative GI ROS, Neg liver ROS,   Endo/Other  negative endocrine ROS  Renal/GU Renal disease  negative genitourinary   Musculoskeletal   Abdominal   Peds  Hematology negative hematology ROS (+)   Anesthesia Other Findings   Reproductive/Obstetrics negative OB ROS                            Anesthesia Physical Anesthesia Plan  ASA: 3  Anesthesia Plan: General   Post-op Pain Management:    Induction: Intravenous  PONV Risk Score and Plan: 2 and Propofol infusion and Treatment may vary due to age or medical condition  Airway Management Planned: Mask  Additional Equipment:   Intra-op Plan:   Post-operative Plan:   Informed Consent: I have reviewed the patients History and Physical, chart, labs and discussed the procedure including the risks, benefits and alternatives for the proposed anesthesia with the patient or authorized representative who has indicated his/her understanding and acceptance.     Dental advisory given  Plan Discussed with: CRNA  Anesthesia Plan Comments:         Anesthesia Quick Evaluation

## 2021-01-04 NOTE — Transfer of Care (Signed)
Immediate Anesthesia Transfer of Care Note  Patient: Austin [name]  Procedure(s) Performed: CARDIOVERSION  Patient Location: PACU and Endoscopy Unit  Anesthesia Type:General  Level of Consciousness: awake and drowsy  Airway & Oxygen Therapy: Patient Spontanous Breathing  Post-op Assessment: Report given to RN and Post -op Vital signs reviewed and stable  Post vital signs: Reviewed and stable  Last Vitals:  Vitals Value Taken Time  BP 110/69   Temp    Pulse 59   Resp 15   SpO2 98     Last Pain:  Vitals:   01/04/21 0919  TempSrc: Oral  PainSc: 0-No pain         Complications: No notable events documented.

## 2021-01-04 NOTE — CV Procedure (Signed)
Procedure: Electrical Cardioversion Indications:  Atrial Fibrillation  Procedure Details:  Consent: Risks of procedure as well as the alternatives and risks of each were explained to the (patient/caregiver).  Consent for procedure obtained.  Time Out: Verified patient identification, verified procedure, site/side was marked, verified correct patient position, special equipment/implants available, medications/allergies/relevent history reviewed, required imaging and test results available. PERFORMED.  Patient placed on cardiac monitor, pulse oximetry, supplemental oxygen as necessary.  Sedation given:  propofol per anesthesia Pacer pads placed anterior and posterior chest.  Cardioverted 1 time(s).  Cardioversion with synchronized biphasic 120J shock.  Evaluation: Findings: Post procedure EKG shows: sinus bradycardia Complications: None Patient did tolerate procedure well.  Time Spent Directly with the Patient:  30 minutes   Austin [name] 01/04/2021, 9:52 AM

## 2021-01-04 NOTE — Interval H&P Note (Signed)
History and Physical Interval Note:  01/04/2021 9:26 AM  Austin [name]  has presented today for surgery, with the diagnosis of AFIB.  The various methods of treatment have been discussed with the patient and family. After consideration of risks, benefits and other options for treatment, the patient has consented to  Procedure(s): CARDIOVERSION ([name]) as a surgical intervention.  The patient's history has been reviewed, patient examined, no change in status, stable for surgery.  I have reviewed the patient's chart and labs.  Questions were answered to the patient's satisfaction.     [name]

## 2021-01-04 NOTE — Discharge Instructions (Signed)
Electrical Cardioversion  Electrical cardioversion is the delivery of a jolt of electricity to restore a normal rhythm to the heart. A rhythm that is too fast or is not regular keeps the heart from pumping well. In this procedure, sticky patches or metal paddles are placed on the chest to deliver electricity to the heart from a device.  What can I expect after the procedure?  Your blood pressure, heart rate, breathing rate, and blood oxygen level will be monitored until you leave the hospital or clinic.  Your heart rhythm will be watched to make sure it does not change.  You may have some redness on the skin where the shocks were given.If this occurs, can use hydrocortisone cream or Aloe vera.  Follow these instructions at home:  Do not drive for 24 hours if you were given a sedative during your procedure.  Take over-the-counter and prescription medicines only as told by your health care provider.  Ask your health care provider how to check your pulse. Check it often.  Rest for 48 hours after the procedure or as told by your health care provider.  Avoid or limit your caffeine use as told by your health care provider.  Keep all follow-up visits as told by your health care provider. This is important.  Contact a health care provider if:  You feel like your heart is beating too quickly or your pulse is not regular.  You have a serious muscle cramp that does not go away.  Get help right away if:  You have discomfort in your chest.  You are dizzy or you feel faint.  You have trouble breathing or you are short of breath.  Your speech is slurred.  You have trouble moving an arm or leg on one side of your body.  Your fingers or toes turn cold or blue.  Summary  Electrical cardioversion is the delivery of a jolt of electricity to restore a normal rhythm to the heart.  This procedure may be done right away in an emergency or may be a scheduled procedure if the condition is not  an emergency.  Generally, this is a safe procedure.  After the procedure, check your pulse often as told by your health care provider.  This information is not intended to replace advice given to you by your health care provider. Make sure you discuss any questions you have with your health care provider. Document Revised: 12/03/2018 Document Reviewed: 12/03/2018 Elsevier Patient Education  2021 Elsevier Inc.  

## 2021-01-04 NOTE — Anesthesia Procedure Notes (Signed)
Date/Time: 01/04/2021 9:45 AM Performed by: Trinna Post., CRNA Pre-anesthesia Checklist: Patient identified, Emergency Drugs available, Suction available, Patient being monitored and Timeout performed Patient Re-evaluated:Patient Re-evaluated prior to induction Oxygen Delivery Method: Ambu bag Preoxygenation: Pre-oxygenation with 100% oxygen Induction Type: IV induction Placement Confirmation: positive ETCO2

## 2021-01-05 ENCOUNTER — Encounter (HOSPITAL_COMMUNITY): Payer: Self-pay | Admitting: Internal Medicine

## 2021-01-05 DIAGNOSIS — Z85828 Personal history of other malignant neoplasm of skin: Secondary | ICD-10-CM | POA: Diagnosis not present

## 2021-01-05 DIAGNOSIS — D692 Other nonthrombocytopenic purpura: Secondary | ICD-10-CM | POA: Diagnosis not present

## 2021-01-05 DIAGNOSIS — D0471 Carcinoma in situ of skin of right lower limb, including hip: Secondary | ICD-10-CM | POA: Diagnosis not present

## 2021-01-05 DIAGNOSIS — C44612 Basal cell carcinoma of skin of right upper limb, including shoulder: Secondary | ICD-10-CM | POA: Diagnosis not present

## 2021-01-05 DIAGNOSIS — L438 Other lichen planus: Secondary | ICD-10-CM | POA: Diagnosis not present

## 2021-01-05 DIAGNOSIS — L821 Other seborrheic keratosis: Secondary | ICD-10-CM | POA: Diagnosis not present

## 2021-01-05 DIAGNOSIS — L298 Other pruritus: Secondary | ICD-10-CM | POA: Diagnosis not present

## 2021-01-05 DIAGNOSIS — L570 Actinic keratosis: Secondary | ICD-10-CM | POA: Diagnosis not present

## 2021-01-05 DIAGNOSIS — D0461 Carcinoma in situ of skin of right upper limb, including shoulder: Secondary | ICD-10-CM | POA: Diagnosis not present

## 2021-01-05 DIAGNOSIS — C44712 Basal cell carcinoma of skin of right lower limb, including hip: Secondary | ICD-10-CM | POA: Diagnosis not present

## 2021-01-12 ENCOUNTER — Other Ambulatory Visit: Payer: Self-pay

## 2021-01-12 ENCOUNTER — Ambulatory Visit (HOSPITAL_COMMUNITY)
Admission: RE | Admit: 2021-01-12 | Discharge: 2021-01-12 | Disposition: A | Discharge status: Home / Self Care | Payer: Medicare PPO | Source: Ambulatory Visit | Attending: Nurse Practitioner | Admitting: Nurse Practitioner

## 2021-01-12 ENCOUNTER — Encounter (HOSPITAL_COMMUNITY): Payer: Self-pay | Admitting: Nurse Practitioner

## 2021-01-12 VITALS — BP 134/76 | HR 64 | Ht 68.0 in | Wt 159.0 lb

## 2021-01-12 DIAGNOSIS — Z8249 Family history of ischemic heart disease and other diseases of the circulatory system: Secondary | ICD-10-CM | POA: Insufficient documentation

## 2021-01-12 DIAGNOSIS — Z79899 Other long term (current) drug therapy: Secondary | ICD-10-CM | POA: Diagnosis not present

## 2021-01-12 DIAGNOSIS — I4819 Other persistent atrial fibrillation: Secondary | ICD-10-CM | POA: Diagnosis not present

## 2021-01-12 DIAGNOSIS — Z7901 Long term (current) use of anticoagulants: Secondary | ICD-10-CM | POA: Insufficient documentation

## 2021-01-12 DIAGNOSIS — I4892 Unspecified atrial flutter: Secondary | ICD-10-CM

## 2021-01-12 DIAGNOSIS — D6869 Other thrombophilia: Secondary | ICD-10-CM

## 2021-01-12 NOTE — Progress Notes (Signed)
Patient ID: Austin [name], male   DOB: 1930-11-17, 87 y.o.   MRN: [id number]     Primary Care Physician: Burnard Bunting, MD Referring Physician: [facility] f/u EP: Dr. Reggy Eye Austin [name] is a 87 y.o. male with a h/o persistent afib that failed flecainide, amiodarone and was admitted and loaded on tikosyn 3/27 thru 3/30. Renal function and electrolytes were followed during the hospitalization. QTc had prolongation and required down-titration of  Tiksoyn with improvement and stable QTc, his EKG reviewed by Dr. Curt Bears.  He converted to sinus rhythm 08/12/15.  He was monitored until discharge on telemetry which demonstrated SR.  F/u in afib clinic today, 09/24/19.He was recently seen by Austin Kilts, PA at the [facility] office and he was in rhythm. QTc was 495 ms. There was a question if Tikosyn needed to be stopped for  qt concerns and changed  to amiodarone. He is on lowest dose of tikosyn at 125 mcg bid.  In the office here today, his qtc is 455 ms and he is in rhythm. He does not want change in therapy. He did try amiodarone briefly at one time, since he only has one eye, he was concerned that amiodarone would negatively effect his vision.  He continues on xarelto 15 mg daily for a CHA2DS2VASc of 3.   F/u in afib clinic, 8/25,he continues on Tikosyn and is maintaining SR. He is pending having his rt eye removed for ongoing pain/ha's with h/o of melanoma/shingles and  infection in the eye. He was advised to hold xarelto x 3 days. Qtc is stable.   F/u in afib clinic, 04/13/20. He is here for surveillance of Tikosyn use. He is not having any afib with Tikosyn use. He is pending more eye surgery Wednesday for melanoma found inside the socket when  eye was removed late summer. He is off DOAC today and will restart Wednesday pm. Qtc is stable.   F/u in afib clinic, 08/19/20. He is reporting fatigue and shortness of breath. Saw his PCP this last week was very weak and had a systolic BP in the 99991111.  PCP felt he may have a UTI and started on antibiotics and also felt that he may be mildly dehydrated. He is staying in [place]. His HR though is 51 bpm and maybe bradycardia from BB may be contributing to symptoms. If this  does not help will need to discuss obtaining  a stress test. Last stress test was in 2017 did not show any ischemia. He reports that at the beach recently he did have shortness of breath carrying items up the stairs that was much worse that in previous years.   F/u in afib clinic, 12/30/20.pt is here for his usual tikosyn surveillance visit but is found to be out of rhythm  Ekg shows that he is out of rhythm in afib at 66 bpm. He has not felt up to par recently since stepping on a hornet's nest 7/23 with known allergy to bee stings. The wife gave him an epi pen and he proceeded to the ER. Ekg then showed afib in the 60's. He was treated with prednisone and d/c home. He then went back to the ER 8/2 with abdominal pain. The cause of this ws thought to be constipation from prednisone. EKG at that visit afib in the 60's. He then went to the beach and got very week. He was treated at [facility] with dehydration. He was not admitted and I do not see  where an EKG was performed. Cardioversion was discussed and he would like to proceed as he is still feeling weaker than usual. No missed anticoagulation. He remains on Tikosyn 125 mcg bid.  F/u in afib clinic, 01/12/22. He had a successful cardioversion 01/04/21. Today in the clinic, Unfortunately, he has returned to rate controlled atrial flutter. He did have more energy after the cardioversion x 2 days. He appears to be failing cardioversion, as he was out of rhythm for a few months before I saw hi. He only has one eye and is concerned regarding  amiodarone affecting his sight. For now, I will leave everything in place but would like him to discuss further with Dr. Curt Bears. His wife thinks using the wheelbarrow at the beach caused him to get out of  rhythm.   Today, he denies symptoms of palpitations, chest pain, shortness of breath, orthopnea, PND, lower extremity edema, dizziness, presyncope, syncope, or neurologic sequela. The patient is tolerating medications without difficulties and is otherwise without complaint today.   Past Medical History:  Diagnosis Date   Allergy    Asthma    BPH (benign prostatic hypertrophy)    CKD (chronic kidney disease) stage 3, GFR 30-59 ml/min (HCC)    Cough    Gout    Hypertensive chronic kidney disease    Melanoma in situ of right eyelid (HCC)    OSA (obstructive sleep apnea)    Persistent atrial fibrillation ([place])    Past Surgical History:  Procedure Laterality Date   CARDIOVERSION [name] 06/05/2015   Procedure: CARDIOVERSION;  Surgeon: Sanda Klein, MD;  Location: [place];  Service: Cardiovascular;  Laterality: [name];   CARDIOVERSION [name] 01/04/2021   Procedure: CARDIOVERSION;  Surgeon: [name], MD;  Location: [place];  Service: Cardiovascular;  Laterality: [name];   EYE SURGERY  12/2017    Current Outpatient Medications  Medication Sig Dispense Refill   acetaminophen (TYLENOL) 500 MG tablet Take 500 mg by mouth every 6 (six) hours as needed (pain.).     albuterol (VENTOLIN HFA) 108 (90 Base) MCG/ACT inhaler Inhale 2 puffs into the lungs every 4 (four) hours as needed for wheezing or shortness of breath (cough).     amLODipine (NORVASC) 2.5 MG tablet TAKE 1 TABLET BY MOUTH EVERY DAY (Patient taking differently: Take 2.5 mg by mouth daily with lunch.) 90 tablet 1   budesonide-formoterol (SYMBICORT) 80-4.5 MCG/ACT inhaler Inhale 2 puffs into the lungs 2 (two) times daily.      Butalbital-APAP-Caff-Cod 50-300-40-30 MG CAPS Take 1 capsule by mouth every 4 (four) hours as needed (migraines).     carvedilol (COREG) 12.5 MG tablet Take 1 tablet (12.5 mg total) by mouth 2 (two) times daily. 180 tablet 1   clobetasol cream (TEMOVATE) AB-123456789 % Apply 1 application topically 2 (two) times  daily as needed (for itching).     DM-APAP-CPM (CORICIDIN HBP) 10-325-2 MG TABS Take 1-2 tablets by mouth 3 (three) times daily as needed (cold symptoms).     dofetilide (TIKOSYN) 125 MCG capsule TAKE 1 CAPSULE (125 MCG TOTAL) BY MOUTH 2 (TWO) TIMES DAILY. 180 capsule 3   EPINEPHrine 0.3 mg/0.3 mL IJ SOAJ injection Inject 0.3 mg into the muscle as needed for anaphylaxis. 1 each 0   fexofenadine (ALLEGRA) 60 MG tablet Take 60 mg by mouth daily.     fluticasone (FLONASE) 50 MCG/ACT nasal spray Place 2 sprays into both nostrils in the morning and at bedtime.  8   furosemide (LASIX) 20 MG tablet Take  1 tablet (20 mg total) by mouth daily. 90 tablet 1   guaiFENesin (MUCINEX) 600 MG 12 hr tablet Take 600 mg by mouth in the morning.     hydrOXYzine (ATARAX/VISTARIL) 25 MG tablet Take 50 mg by mouth at bedtime.     indomethacin (INDOCIN) 50 MG capsule Take 50 mg by mouth as needed (for gout flare up).     isosorbide mononitrate (IMDUR) 60 MG 24 hr tablet TAKE 1 TABLET BY MOUTH EVERY DAY 90 tablet 2   KLOR-CON M20 20 MEQ tablet TAKE 1 TABLET BY MOUTH EVERY DAY 90 tablet 3   lidocaine-prilocaine (EMLA) cream Apply 1 application topically as needed (itching).     MAGNESIUM CITRATE PO Take 250 mg by mouth at bedtime.     olmesartan (BENICAR) 40 MG tablet TAKE 1 TABLET BY MOUTH EVERY DAY. *REPLACES LOSARTAN 90 tablet 2   Polyethyl Glycol-Propyl Glycol 0.4-0.3 % SOLN Place 1 drop into the left eye 2 (two) times daily.     Probiotic Product ([place]) CAPS Take 1 capsule by mouth daily.     promethazine (PHENERGAN) 25 MG tablet Take 25 mg by mouth every 6 (six) hours as needed for nausea or vomiting.     sertraline (ZOLOFT) 50 MG tablet Take 50 mg by mouth at bedtime.     triamcinolone (NASACORT) 55 MCG/ACT AERO nasal inhaler Place 2 sprays into the nose 2 (two) times daily.     valACYclovir (VALTREX) 1000 MG tablet Take 1,000 mg by mouth at bedtime.     XARELTO 15 MG TABS tablet TAKE 1 TABLET BY  MOUTH EVERY DAY WITH SUPPER 90 tablet 1   zolpidem (AMBIEN) 10 MG tablet Take 10 mg by mouth at bedtime as needed for sleep.   5   No current facility-administered medications for this encounter.    Allergies  Allergen Reactions   Bee Venom Anaphylaxis   Diltiazem Other (See Comments)    Severe headaches 120 mg BID   Codeine Nausea And Vomiting   Ivp Dye [Iodinated Diagnostic Agents] Rash   Penicillins Swelling and Rash    Has patient had a PCN reaction causing immediate rash, facial/tongue/throat swelling, SOB or lightheadedness with hypotension: Yes Has patient had a PCN reaction causing severe rash involving mucus membranes or skin necrosis: No Has patient had a PCN reaction that required hospitalization No Has patient had a PCN reaction occurring within the last 10 years: No If all of the above answers are "NO", then may proceed with Cephalosporin use.   Tape Itching and Rash    Redness.  Please use "paper" tape only    Social History   Socioeconomic History   Marital status: Married    Spouse name: [name]   Number of children: Not on file   Years of education: Not on file   Highest education level: Not on file  Occupational History   Not on file  Tobacco Use   Smoking status: Never   Smokeless tobacco: Never  Substance and Sexual Activity   Alcohol use: Yes    Alcohol/week: 1.0 standard drink    Types: 1 Standard drinks or equivalent per week    Comment: occ   Drug use: No   Sexual activity: Not on file  Other Topics Concern   Not on file  Social History Narrative   Right Handed.  Caffiene  Coffee, tea 3 cups daily.   Pt retired.     Lives at home with wife.  Social Determinants of Health   Financial Resource Strain: Not on file  Food Insecurity: Not on file  Transportation Needs: Not on file  Physical Activity: Not on file  Stress: Not on file  Social Connections: Not on file  Intimate Partner Violence: Not on file    Family History  Problem  Relation Age of Onset   Hypertension Father    Lung cancer Father    Hypertension Mother     ROS- All systems are reviewed and negative except as per the HPI above  Physical Exam: There were no vitals filed for this visit.   GEN- The patient is well appearing, alert and oriented x 3 today.   Head- normocephalic, atraumatic Eyes-  Sclera clear, conjunctiva pink. Blind rt eye Ears- hearing intact Oropharynx- clear Neck- supple, no JVP Lymph- no cervical lymphadenopathy Lungs- Clear to ausculation bilaterally, normal work of breathing Heart- irregular rate and rhythm, no murmurs, rubs or gallops, PMI not laterally displaced GI- soft, NT, ND, + BS Extremities- no clubbing, cyanosis, or edema MS- no significant deformity or atrophy Skin- no rash or lesion Psych- euthymic mood, full affect Neuro- strength and sensation are intact  EKG- atrial flutter  with variable block, at 66 bpm Epic records reviewed  2021-Echo- . Left ventricular ejection fraction, by estimation, is 60 to 65%. The left ventricle has normal function. The left ventricle has no regional wall motion abnormalities. There is severe left ventricular hypertrophy. Left ventricular diastolic parameters are consistent with Grade II diastolic dysfunction (pseudonormalization). Elevated left atrial pressure. 2. Right ventricular systolic function is mildly reduced. The right ventricular size is normal. There is normal pulmonary artery systolic pressure. 3. The mitral valve is normal in structure. Trivial mitral valve regurgitation. 4. The aortic valve was not well visualized. Aortic valve regurgitation is mild. Mild aortic valve stenosis. Vmax 2.3 m/s, Mg 9 mmHg, AVA 1.3 cm^2, DI 0.43 5. Aortic dilatation noted. There is mild dilatation of the aortic root measuring 40 mm. Mild dilatation of the ascending aorta measuring 40m 6. Left atrial size was mildly dilated. 7. Severe asymmetric hypertrophy of basal septum. Also  with apical hypertrophy with cavity obliteration during systole. Consider cardiac MRI to evaluate for hypertrophic cardiomyopathy or amyloidosis.  2017-Nuclear stress EF: 55%. There was no ST segment deviation noted during stress. Patient was in atrial fibrillation throughout the study. There is a small defect of mild severity present in the basal inferolateral and mid inferolateral location. The defect is non-reversible. There is a small defect of mild severity present in the basal inferior and mid inferior location. The defect is non-reversible.There is a small defect of mild severity present in the apex location. The defect is non-reversible. No ischemia noted. This is a low risk study. The left ventricular ejection fraction is normal (55-65%).    Assessment and Plan: 1. Persistent afib Has been maintaining SR on Tikosyn  until July of this summer when received a bee staing and EPic pen but was not picked up by providers until he saw me mid August,   He had successful cardioversion 8/22 but possibly only lasted 2 days. He does notice fatigue when out of rhythm.  With ERAF, I want him  to discuss rhythm options going forward with Dr. CCurt Bears He appears to be failing tikosyn  but is hesitant to switch over to amiodarone for possible retinal deposits  and vision loss in his only eye  2. CHA2DS2VASc score  of 3 Continue xarelto 15 mg qd  F/u with Dr. Gaynelle Cage C. Jadeyn Hargett, [facility] [address] [place], [place] [address] [phone]

## 2021-01-13 ENCOUNTER — Ambulatory Visit (HOSPITAL_COMMUNITY): Payer: Medicare PPO | Admitting: Nurse Practitioner

## 2021-01-19 ENCOUNTER — Other Ambulatory Visit: Payer: Self-pay

## 2021-01-19 ENCOUNTER — Ambulatory Visit: Payer: Medicare PPO | Admitting: Cardiology

## 2021-01-19 ENCOUNTER — Encounter: Payer: Self-pay | Admitting: Cardiology

## 2021-01-19 VITALS — BP 150/80 | HR 68 | Ht 69.0 in | Wt 161.0 lb

## 2021-01-19 DIAGNOSIS — I4819 Other persistent atrial fibrillation: Secondary | ICD-10-CM

## 2021-01-19 MED ORDER — DRONEDARONE HCL 400 MG PO TABS: 400.0000 mg | ORAL_TABLET | Freq: Two times a day (BID) | ORAL | 3 refills | Status: DC

## 2021-01-19 NOTE — H&P (View-Only) (Signed)
Electrophysiology Office Note   Date:  01/19/2021   ID:  Austin [name], DOB 10-30-1930, MRN [id number]  PCP:  Burnard Bunting, MD  Primary Electrophysiologist:  Shereese Bonnie Meredith Leeds, MD    No chief complaint on file.    History of Present Illness: Austin [name] is a 87 y.o. male who presents today for electrophysiology evaluation.     He has a history of melanoma of his right eyelid with subsequent loss of vision in the right eye, COPD, CKD, sleep apnea.  He was diagnosed with atrial fibrillation in 2017.  He was admitted to the hospital loaded on dofetilide.  He converted to sinus rhythm on Tikosyn.  He unfortunately has had more frequent episodes of atrial fibrillation over this past summer.  He had a bee sting 12/05/2020.  He has a known allergy to bee stings.  He had an EpiPen and proceed to the emergency room.  He was noted to be back in atrial fibrillation at that time.  He was treated with prednisone and was discharged home.  He went to the ER 12/15/2020 with abdominal pain.  ECG showed atrial fibrillation at the time.  He had a cardioversion 01/04/2021.  He unfortunately went into rate controlled atrial flutter.  He had more energy after cardioversion for 2 days.  Today, denies symptoms of palpitations, chest pain, shortness of breath, orthopnea, PND, lower extremity edema, claudication, dizziness, presyncope, syncope, bleeding, or neurologic sequela. The patient is tolerating medications without difficulties. He does not have chest pain or shortness of breath currently.  His only complaint is fatigue.  He feels that he has difficulty doing many of his daily activities due to his fatigue.  He did feel quite a bit better after his cardioversion, but fatigue is since continued. Get back into normal rhythm.  He would like to try and avoid amiodarone as he has had corneal deposits on this medication.   Past Medical History:  Diagnosis Date   Allergy    Asthma    BPH (benign  prostatic hypertrophy)    CKD (chronic kidney disease) stage 3, GFR 30-59 ml/min (HCC)    Cough    Gout    Hypertensive chronic kidney disease    Melanoma in situ of right eyelid (HCC)    OSA (obstructive sleep apnea)    Persistent atrial fibrillation ([place])    Past Surgical History:  Procedure Laterality Date   CARDIOVERSION [name] 06/05/2015   Procedure: CARDIOVERSION;  Surgeon: Sanda Klein, MD;  Location: [place];  Service: Cardiovascular;  Laterality: [name];   CARDIOVERSION [name] 01/04/2021   Procedure: CARDIOVERSION;  Surgeon: [name], MD;  Location: [place];  Service: Cardiovascular;  Laterality: [name];   EYE SURGERY  12/2017     Current Outpatient Medications  Medication Sig Dispense Refill   acetaminophen (TYLENOL) 500 MG tablet Take 500 mg by mouth every 6 (six) hours as needed (pain.).     albuterol (VENTOLIN HFA) 108 (90 Base) MCG/ACT inhaler Inhale 2 puffs into the lungs every 4 (four) hours as needed for wheezing or shortness of breath (cough).     amLODipine (NORVASC) 2.5 MG tablet TAKE 1 TABLET BY MOUTH EVERY DAY (Patient taking differently: Take 2.5 mg by mouth daily with lunch.) 90 tablet 1   budesonide-formoterol (SYMBICORT) 80-4.5 MCG/ACT inhaler Inhale 2 puffs into the lungs 2 (two) times daily.      Butalbital-APAP-Caff-Cod 50-300-40-30 MG CAPS Take 1 capsule by mouth every 4 (four) hours as needed (migraines).  carvedilol (COREG) 12.5 MG tablet Take 1 tablet (12.5 mg total) by mouth 2 (two) times daily. 180 tablet 1   clobetasol cream (TEMOVATE) AB-123456789 % Apply 1 application topically 2 (two) times daily as needed (for itching).     DM-APAP-CPM (CORICIDIN HBP) 10-325-2 MG TABS Take 1-2 tablets by mouth 3 (three) times daily as needed (cold symptoms).     dofetilide (TIKOSYN) 125 MCG capsule TAKE 1 CAPSULE (125 MCG TOTAL) BY MOUTH 2 (TWO) TIMES DAILY. 180 capsule 3   EPINEPHrine 0.3 mg/0.3 mL IJ SOAJ injection Inject 0.3 mg into the muscle as needed for  anaphylaxis. 1 each 0   fexofenadine (ALLEGRA) 60 MG tablet Take 60 mg by mouth daily.     fluticasone (FLONASE) 50 MCG/ACT nasal spray Place 2 sprays into both nostrils in the morning and at bedtime.  8   furosemide (LASIX) 20 MG tablet Take 1 tablet (20 mg total) by mouth daily. 90 tablet 1   guaiFENesin (MUCINEX) 600 MG 12 hr tablet Take 600 mg by mouth in the morning.     hydrOXYzine (ATARAX/VISTARIL) 25 MG tablet Take 50 mg by mouth at bedtime.     indomethacin (INDOCIN) 50 MG capsule Take 50 mg by mouth as needed (for gout flare up).     isosorbide mononitrate (IMDUR) 60 MG 24 hr tablet TAKE 1 TABLET BY MOUTH EVERY DAY 90 tablet 2   KLOR-CON M20 20 MEQ tablet TAKE 1 TABLET BY MOUTH EVERY DAY 90 tablet 3   lidocaine-prilocaine (EMLA) cream Apply 1 application topically as needed (itching).     MAGNESIUM CITRATE PO Take 250 mg by mouth at bedtime.     olmesartan (BENICAR) 40 MG tablet TAKE 1 TABLET BY MOUTH EVERY DAY. *REPLACES LOSARTAN 90 tablet 2   Polyethyl Glycol-Propyl Glycol 0.4-0.3 % SOLN Place 1 drop into the left eye 2 (two) times daily.     Probiotic Product ([place]) CAPS Take 1 capsule by mouth daily.     promethazine (PHENERGAN) 25 MG tablet Take 25 mg by mouth every 6 (six) hours as needed for nausea or vomiting.     sertraline (ZOLOFT) 50 MG tablet Take 50 mg by mouth at bedtime.     triamcinolone (NASACORT) 55 MCG/ACT AERO nasal inhaler Place 2 sprays into the nose 2 (two) times daily.     valACYclovir (VALTREX) 1000 MG tablet Take 1,000 mg by mouth at bedtime.     XARELTO 15 MG TABS tablet TAKE 1 TABLET BY MOUTH EVERY DAY WITH SUPPER 90 tablet 1   zolpidem (AMBIEN) 10 MG tablet Take 10 mg by mouth at bedtime as needed for sleep.   5   No current facility-administered medications for this visit.    Allergies:   Bee venom, Other, Diatrizoate, Diltiazem, Yohimbine, Codeine, Ivp dye [iodinated diagnostic agents], Penicillins, and Tape   Social History:  The  patient  reports that he has never smoked. He has never used smokeless tobacco. He reports current alcohol use of about 1.0 standard drink per week. He reports that he does not use drugs.   Family History:  The patient's family history includes Hypertension in his father and mother; Lung cancer in his father.   ROS:  Please see the history of present illness.   Otherwise, review of systems is positive for none.   All other systems are reviewed and negative.   PHYSICAL EXAM: VS:  BP (!) 150/80   Pulse 68   Ht '5\' 9"'$  (1.753 m)  Wt 161 lb (73 kg)   SpO2 96%   BMI 23.78 kg/m  , BMI Body mass index is 23.78 kg/m. GEN: Well nourished, well developed, in no acute distress  HEENT: normal  Neck: no JVD, carotid bruits, or masses Cardiac: RRR; no murmurs, rubs, or gallops,no edema  Respiratory:  clear to auscultation bilaterally, normal work of breathing GI: soft, nontender, nondistended, + BS MS: no deformity or atrophy  Skin: warm and dry Neuro:  Strength and sensation are intact Psych: euthymic mood, full affect  EKG:  EKG is not ordered today. Personal review of the ekg ordered 01/12/21 shows atrial flutter.  Recent Labs: 12/12/2020: ALT 22 12/30/2020: BUN 17; Creatinine, Ser 1.39; Hemoglobin 11.6; Magnesium 2.2; Platelets 338; Potassium 4.5; Sodium 140    Lipid Panel  No results found for: CHOL, TRIG, HDL, CHOLHDL, VLDL, LDLCALC, LDLDIRECT   Wt Readings from Last 3 Encounters:  01/19/21 161 lb (73 kg)  01/12/21 159 lb (72.1 kg)  01/04/21 158 lb 8.2 oz (71.9 kg)      Other studies Reviewed: Additional studies/ records that were reviewed today include: TTE 10/09/19  1. Left ventricular ejection fraction, by estimation, is 60 to 65%. The  left ventricle has normal function. The left ventricle has no regional  wall motion abnormalities. There is severe left ventricular hypertrophy.  Left ventricular diastolic parameters   are consistent with Grade II diastolic dysfunction  (pseudonormalization).  Elevated left atrial pressure.   2. Right ventricular systolic function is mildly reduced. The right  ventricular size is normal. There is normal pulmonary artery systolic  pressure.   3. The mitral valve is normal in structure. Trivial mitral valve  regurgitation.   4. The aortic valve was not well visualized. Aortic valve regurgitation  is mild. Mild aortic valve stenosis. Vmax 2.3 m/s, Mg 9 mmHg, AVA 1.3  cm^2, DI 0.43   5. Aortic dilatation noted. There is mild dilatation of the aortic root  measuring 40 mm. Mild dilatation of the ascending aorta measuring 85m   6. Left atrial size was mildly dilated.   7. Severe asymmetric hypertrophy of basal septum. Also with apical  hypertrophy with cavity obliteration during systole. Consider cardiac MRI  to evaluate for hypertrophic cardiomyopathy or amyloidosis.    ASSESSMENT AND PLAN:  1.  Persistent atrial fibrillation: Currently on dofetilide 125 mcg twice daily, Xarelto 15 mg daily.  High risk medication monitoring via ECG today.  CHA2DS2-VASc of 3.  Unfortunately he has had more frequent episodes with atrial fibrillation and atrial flutter.  He appears to be failing dofetilide.  Due to that, we Tatiyanna Lashley stop dofetilide today and start him on Multaq 400 mg twice daily.  He Juanangel Soderholm need a cardioversion thereafter to get back into normal rhythm.  2.  Hypertension: Currently on Norvasc 2.5 mg daily, Lasix 20 mg daily, carvedilol 12.5 mg daily, Benicar 40 mg daily.   Current medicines are reviewed at length with the patient today.   The patient does not have concerns regarding his medicines.  The following changes were made today: Dofetilide, start Multaq  Labs/ tests ordered today include:  No orders of the defined types were placed in this encounter.    Disposition:   FU with Kathrin Folden 3 months  Signed, Kasie Leccese MMeredith Leeds MD  01/19/2021 4:11 PM     [facility] [phone]) [phone](fax)

## 2021-01-19 NOTE — Patient Instructions (Addendum)
Medication Instructions:  Your physician has recommended you make the following change in your medication: STOP Tikosyn START Multaq 400 mg twice daily  *If you need a refill on your cardiac medications before your next appointment, please call your pharmacy*   Lab Work: None ordered   Testing/Procedures: Your physician has recommended that you have a Cardioversion (DCCV) a couple day after starting the Multaq. Electrical Cardioversion uses a jolt of electricity to your heart either through paddles or wired patches attached to your chest. This is a controlled, usually prescheduled, procedure. Defibrillation is done under light anesthesia in the hospital, and you usually go home the day of the procedure. This is done to get your heart back into a normal rhythm. You are not awake for the procedure.     Follow-Up: At [facility], you and your health needs are our priority.  As part of our continuing mission to provide you with exceptional heart care, we have created designated Provider Care Teams.  These Care Teams include your primary Cardiologist (physician) and Advanced Practice Providers (APPs -  Physician Assistants and Nurse Practitioners) who all work together to provide you with the care you need, when you need it.  Your next appointment:   6 week(s)  The format for your next appointment:   In Person  Provider:   You will see one of the following Advanced Practice Providers on your designated Care Team:   Tommye Standard, [place] Legrand Como "Jonni Sanger" Chalmers Cater, [place]   Thank you for choosing [facility]!!   Trinidad Curet, RN [phone]   Other Instructions   [employer] patient connects:  [phone]   Dronedarone tablets What is this medication? DRONEDARONE (droe NE da rone) is an antiarrhythmic drug. It helps make your heart beat regularly. This medicine may be used for other purposes; ask your health care provider or pharmacist if you have questions. COMMON BRAND  NAME(S): Multaq What should I tell my care team before I take this medication? They need to know if you have any of these conditions: heart failure history of irregular heartbeat liver disease liver or lung problems with the past use of amiodarone low levels of magnesium in the blood low levels of potassium in the blood other heart disease an unusual or allergic reaction to dronedarone, other medicines, foods, dyes, or preservatives pregnant or trying to get pregnant breast-feeding How should I use this medication? Take this medicine by mouth with a glass of water. Follow the directions on the prescription label. Take one tablet with the morning meal and one tablet with the evening meal. Do not take your medicine more often than directed. Do not stop taking except on the advice of your doctor or health care professional. A special MedGuide will be given to you by the pharmacist with each prescription and refill. Be sure to read this information carefully each time. Talk to your pediatrician regarding the use of this medicine in children. Special care may be needed. Overdosage: If you think you have taken too much of this medicine contact a poison control center or emergency room at once. NOTE: This medicine is only for you. Do not share this medicine with others. What if I miss a dose? If you miss a dose, take it as soon as you can. If it is almost time for your next dose, take only that dose. Do not take double or extra doses. What may interact with this medication? Do not take this medicine with any of the following medications: arsenic  trioxide certain antibiotics like clarithromycin, erythromycin, pentamidine, telithromycin, troleandomycin certain medicines for depression like tricyclic antidepressants certain medicines for fungal infections like fluconazole, itraconazole, ketoconazole, posaconazole, voriconazole certain medicines for irregular heart beat like amiodarone, disopyramide,  flecainide, ibutilide, quinidine, propafenone, sotalol certain medicines for malaria like chloroquine, halofantrine cisapride cyclosporine droperidol haloperidol methadone other medicines that prolong the QT interval (cause an abnormal heart rhythm) like degarelix, encorafenib, entrectinib, eribulin, goserelin, lapatinib pimozide nefazodone phenothiazines like chlorpromazine, mesoridazine, prochlorperazine, thioridazine ritonavir ziprasidone This medicine may also interact with the following medications: certain medicines for blood pressure, heart disease, or irregular heart beat like diltiazem, metoprolol, propranolol, verapamil certain medicines for cholesterol like atorvastatin, lovastatin, simvastatin certain medicines for seizures like carbamazepine, phenobarbital, phenytoin digoxin dofetilide grapefruit juice rifampin sirolimus St. John's Wort tacrolimus This list may not describe all possible interactions. Give your health care provider a list of all the medicines, herbs, non-prescription drugs, or dietary supplements you use. Also tell them if you smoke, drink alcohol, or use illegal drugs. Some items may interact with your medicine. What should I watch for while using this medication? Your condition will be monitored closely when you first begin therapy. Often, this drug is first started in a hospital or other monitored health care setting. Once you are on maintenance therapy, visit your doctor or health care professional for regular checks on your progress. Because your condition and use of this medicine carry some risk, it is a good idea to carry an identification card, necklace or bracelet with details of your condition, medications, and doctor or health care professional. Dennis Bast may get drowsy or dizzy. Do not drive, use machinery, or do anything that needs mental alertness until you know how this medicine affects you. Do not stand or sit up quickly, especially if you are an  older patient. This reduces the risk of dizzy or fainting spells. What side effects may I notice from receiving this medication? Side effects that you should report to your doctor or health care professional as soon as possible: allergic reactions like skin rash, itching or hives, swelling of the face, lips, or tongue breathing problems cough dark urine fast, irregular heartbeat general ill feeling or flu-like symptoms light-colored stools loss of appetite, nausea right upper belly pain slow heartbeat stomach pain swelling of the legs or ankles unusually weak or tired weight gain yellowing of the eyes or skin Side effects that usually do not require medical attention (report to your doctor or health care professional if they continue or are bothersome): nausea vomiting This list may not describe all possible side effects. Call your doctor for medical advice about side effects. You may report side effects to FDA at 1-800-FDA-1088. Where should I keep my medication? Keep out of the reach of children. Store at room temperature between 15 and 30 degrees C (59 and 86 degrees F). Throw away any unused medicine after the expiration date. NOTE: This sheet is a summary. It may not cover all possible information. If you have questions about this medicine, talk to your doctor, pharmacist, or health care provider.  2022 Elsevier/Gold Standard (2019-04-03 10:12:41)

## 2021-01-19 NOTE — Progress Notes (Signed)
Electrophysiology Office Note   Date:  01/19/2021   ID:  JAQUELL BURBA, DOB 10-13-1930, MRN [id number]  PCP:  Burnard Bunting, MD  Primary Electrophysiologist:  Sheralee Qazi Meredith Leeds, MD    No chief complaint on file.    History of Present Illness: Austin [name] is a 87 y.o. male who presents today for electrophysiology evaluation.     He has a history of melanoma of his right eyelid with subsequent loss of vision in the right eye, COPD, CKD, sleep apnea.  He was diagnosed with atrial fibrillation in 2017.  He was admitted to the hospital loaded on dofetilide.  He converted to sinus rhythm on Tikosyn.  He unfortunately has had more frequent episodes of atrial fibrillation over this past summer.  He had a bee sting 12/05/2020.  He has a known allergy to bee stings.  He had an EpiPen and proceed to the emergency room.  He was noted to be back in atrial fibrillation at that time.  He was treated with prednisone and was discharged home.  He went to the ER 12/15/2020 with abdominal pain.  ECG showed atrial fibrillation at the time.  He had a cardioversion 01/04/2021.  He unfortunately went into rate controlled atrial flutter.  He had more energy after cardioversion for 2 days.  Today, denies symptoms of palpitations, chest pain, shortness of breath, orthopnea, PND, lower extremity edema, claudication, dizziness, presyncope, syncope, bleeding, or neurologic sequela. The patient is tolerating medications without difficulties. He does not have chest pain or shortness of breath currently.  His only complaint is fatigue.  He feels that he has difficulty doing many of his daily activities due to his fatigue.  He did feel quite a bit better after his cardioversion, but fatigue is since continued. Get back into normal rhythm.  He would like to try and avoid amiodarone as he has had corneal deposits on this medication.   Past Medical History:  Diagnosis Date   Allergy    Asthma    BPH (benign  prostatic hypertrophy)    CKD (chronic kidney disease) stage 3, GFR 30-59 ml/min (HCC)    Cough    Gout    Hypertensive chronic kidney disease    Melanoma in situ of right eyelid (HCC)    OSA (obstructive sleep apnea)    Persistent atrial fibrillation ([place])    Past Surgical History:  Procedure Laterality Date   CARDIOVERSION [name] 06/05/2015   Procedure: CARDIOVERSION;  Surgeon: Sanda Klein, MD;  Location: [place];  Service: Cardiovascular;  Laterality: [name];   CARDIOVERSION [name] 01/04/2021   Procedure: CARDIOVERSION;  Surgeon: [name], MD;  Location: [place];  Service: Cardiovascular;  Laterality: [name];   EYE SURGERY  12/2017     Current Outpatient Medications  Medication Sig Dispense Refill   acetaminophen (TYLENOL) 500 MG tablet Take 500 mg by mouth every 6 (six) hours as needed (pain.).     albuterol (VENTOLIN HFA) 108 (90 Base) MCG/ACT inhaler Inhale 2 puffs into the lungs every 4 (four) hours as needed for wheezing or shortness of breath (cough).     amLODipine (NORVASC) 2.5 MG tablet TAKE 1 TABLET BY MOUTH EVERY DAY (Patient taking differently: Take 2.5 mg by mouth daily with lunch.) 90 tablet 1   budesonide-formoterol (SYMBICORT) 80-4.5 MCG/ACT inhaler Inhale 2 puffs into the lungs 2 (two) times daily.      Butalbital-APAP-Caff-Cod 50-300-40-30 MG CAPS Take 1 capsule by mouth every 4 (four) hours as needed (migraines).  carvedilol (COREG) 12.5 MG tablet Take 1 tablet (12.5 mg total) by mouth 2 (two) times daily. 180 tablet 1   clobetasol cream (TEMOVATE) [id number] % Apply 1 application topically 2 (two) times daily as needed (for itching).     DM-APAP-CPM (CORICIDIN HBP) 10-325-2 MG TABS Take 1-2 tablets by mouth 3 (three) times daily as needed (cold symptoms).     dofetilide (TIKOSYN) 125 MCG capsule TAKE 1 CAPSULE (125 MCG TOTAL) BY MOUTH 2 (TWO) TIMES DAILY. 180 capsule 3   EPINEPHrine 0.3 mg/0.3 mL IJ SOAJ injection Inject 0.3 mg into the muscle as needed for  anaphylaxis. 1 each 0   fexofenadine (ALLEGRA) 60 MG tablet Take 60 mg by mouth daily.     fluticasone (FLONASE) 50 MCG/ACT nasal spray Place 2 sprays into both nostrils in the morning and at bedtime.  8   furosemide (LASIX) 20 MG tablet Take 1 tablet (20 mg total) by mouth daily. 90 tablet 1   guaiFENesin (MUCINEX) 600 MG 12 hr tablet Take 600 mg by mouth in the morning.     hydrOXYzine (ATARAX/VISTARIL) 25 MG tablet Take 50 mg by mouth at bedtime.     indomethacin (INDOCIN) 50 MG capsule Take 50 mg by mouth as needed (for gout flare up).     isosorbide mononitrate (IMDUR) 60 MG 24 hr tablet TAKE 1 TABLET BY MOUTH EVERY DAY 90 tablet 2   KLOR-CON M20 20 MEQ tablet TAKE 1 TABLET BY MOUTH EVERY DAY 90 tablet 3   lidocaine-prilocaine (EMLA) cream Apply 1 application topically as needed (itching).     MAGNESIUM CITRATE PO Take 250 mg by mouth at bedtime.     olmesartan (BENICAR) 40 MG tablet TAKE 1 TABLET BY MOUTH EVERY DAY. *REPLACES LOSARTAN 90 tablet 2   Polyethyl Glycol-Propyl Glycol 0.4-0.3 % SOLN Place 1 drop into the left eye 2 (two) times daily.     Probiotic Product ([place]) CAPS Take 1 capsule by mouth daily.     promethazine (PHENERGAN) 25 MG tablet Take 25 mg by mouth every 6 (six) hours as needed for nausea or vomiting.     sertraline (ZOLOFT) 50 MG tablet Take 50 mg by mouth at bedtime.     triamcinolone (NASACORT) 55 MCG/ACT AERO nasal inhaler Place 2 sprays into the nose 2 (two) times daily.     valACYclovir (VALTREX) 1000 MG tablet Take 1,000 mg by mouth at bedtime.     XARELTO 15 MG TABS tablet TAKE 1 TABLET BY MOUTH EVERY DAY WITH SUPPER 90 tablet 1   zolpidem (AMBIEN) 10 MG tablet Take 10 mg by mouth at bedtime as needed for sleep.   5   No current facility-administered medications for this visit.    Allergies:   Bee venom, Other, Diatrizoate, Diltiazem, Yohimbine, Codeine, Ivp dye [iodinated diagnostic agents], Penicillins, and Tape   Social History:  The  patient  reports that he has never smoked. He has never used smokeless tobacco. He reports current alcohol use of about 1.0 standard drink per week. He reports that he does not use drugs.   Family History:  The patient's family history includes Hypertension in his father and mother; Lung cancer in his father.   ROS:  Please see the history of present illness.   Otherwise, review of systems is positive for none.   All other systems are reviewed and negative.   PHYSICAL EXAM: VS:  BP (!) 150/80   Pulse 68   Ht '5\' 9"'$  (1.753 m)  Wt 161 lb (73 kg)   SpO2 96%   BMI 23.78 kg/m  , BMI Body mass index is 23.78 kg/m. GEN: Well nourished, well developed, in no acute distress  HEENT: normal  Neck: no JVD, carotid bruits, or masses Cardiac: RRR; no murmurs, rubs, or gallops,no edema  Respiratory:  clear to auscultation bilaterally, normal work of breathing GI: soft, nontender, nondistended, + BS MS: no deformity or atrophy  Skin: warm and dry Neuro:  Strength and sensation are intact Psych: euthymic mood, full affect  EKG:  EKG is not ordered today. Personal review of the ekg ordered 01/12/21 shows atrial flutter.  Recent Labs: 12/12/2020: ALT 22 12/30/2020: BUN 17; Creatinine, Ser 1.39; Hemoglobin 11.6; Magnesium 2.2; Platelets 338; Potassium 4.5; Sodium 140    Lipid Panel  No results found for: CHOL, TRIG, HDL, CHOLHDL, VLDL, LDLCALC, LDLDIRECT   Wt Readings from Last 3 Encounters:  01/19/21 161 lb (73 kg)  01/12/21 159 lb (72.1 kg)  01/04/21 158 lb 8.2 oz (71.9 kg)      Other studies Reviewed: Additional studies/ records that were reviewed today include: TTE 10/09/19  1. Left ventricular ejection fraction, by estimation, is 60 to 65%. The  left ventricle has normal function. The left ventricle has no regional  wall motion abnormalities. There is severe left ventricular hypertrophy.  Left ventricular diastolic parameters   are consistent with Grade II diastolic dysfunction  (pseudonormalization).  Elevated left atrial pressure.   2. Right ventricular systolic function is mildly reduced. The right  ventricular size is normal. There is normal pulmonary artery systolic  pressure.   3. The mitral valve is normal in structure. Trivial mitral valve  regurgitation.   4. The aortic valve was not well visualized. Aortic valve regurgitation  is mild. Mild aortic valve stenosis. Vmax 2.3 m/s, Mg 9 mmHg, AVA 1.3  cm^2, DI 0.43   5. Aortic dilatation noted. There is mild dilatation of the aortic root  measuring 40 mm. Mild dilatation of the ascending aorta measuring 70m   6. Left atrial size was mildly dilated.   7. Severe asymmetric hypertrophy of basal septum. Also with apical  hypertrophy with cavity obliteration during systole. Consider cardiac MRI  to evaluate for hypertrophic cardiomyopathy or amyloidosis.    ASSESSMENT AND PLAN:  1.  Persistent atrial fibrillation: Currently on dofetilide 125 mcg twice daily, Xarelto 15 mg daily.  High risk medication monitoring via ECG today.  CHA2DS2-VASc of 3.  Unfortunately he has had more frequent episodes with atrial fibrillation and atrial flutter.  He appears to be failing dofetilide.  Due to that, we Aaryn Sermon stop dofetilide today and start him on Multaq 400 mg twice daily.  He Fredric Slabach need a cardioversion thereafter to get back into normal rhythm.  2.  Hypertension: Currently on Norvasc 2.5 mg daily, Lasix 20 mg daily, carvedilol 12.5 mg daily, Benicar 40 mg daily.   Current medicines are reviewed at length with the patient today.   The patient does not have concerns regarding his medicines.  The following changes were made today: Dofetilide, start Multaq  Labs/ tests ordered today include:  No orders of the defined types were placed in this encounter.    Disposition:   FU with Kalany Diekmann 3 months  Signed, Colson Barco MMeredith Leeds MD  01/19/2021 4:11 PM     [address] [phone](office) [phone](fax)

## 2021-01-22 ENCOUNTER — Telehealth: Payer: Self-pay | Admitting: Cardiology

## 2021-01-22 NOTE — Telephone Encounter (Signed)
pts wifewould like Sherrie Price to contact her once cardioversion is scheduled... please advise

## 2021-01-25 ENCOUNTER — Other Ambulatory Visit: Payer: Self-pay | Admitting: Physician Assistant

## 2021-01-25 ENCOUNTER — Other Ambulatory Visit: Payer: Self-pay | Admitting: Cardiology

## 2021-01-25 DIAGNOSIS — N183 Chronic kidney disease, stage 3 unspecified: Secondary | ICD-10-CM

## 2021-01-25 DIAGNOSIS — I4819 Other persistent atrial fibrillation: Secondary | ICD-10-CM

## 2021-01-25 DIAGNOSIS — I10 Essential (primary) hypertension: Secondary | ICD-10-CM

## 2021-01-27 ENCOUNTER — Telehealth: Payer: Self-pay | Admitting: Cardiology

## 2021-01-27 NOTE — Telephone Encounter (Signed)
Left message for patient advising him that Venida Jarvis is out of the office today. She will call to set up cardioversion upon return. Advised to call back if he had any further questions.

## 2021-01-27 NOTE — Telephone Encounter (Signed)
Wife was hoping to speak to Sherri to get the patient's Cardioversion scheduled

## 2021-01-28 ENCOUNTER — Telehealth: Payer: Self-pay | Admitting: Cardiology

## 2021-01-28 NOTE — Telephone Encounter (Signed)
Discussed dates... wife aware I will schedule and call back.  She is agreeable to plan.

## 2021-01-28 NOTE — Telephone Encounter (Signed)
Sent mychart message

## 2021-01-28 NOTE — Telephone Encounter (Signed)
Patient and wife called and said that he is scheduled for a cardioversion on a day where he can't come. Please call back

## 2021-02-03 ENCOUNTER — Other Ambulatory Visit (HOSPITAL_COMMUNITY): Payer: Self-pay | Admitting: *Deleted

## 2021-02-03 MED ORDER — CARVEDILOL 12.5 MG PO TABS: 6.2500 mg | ORAL_TABLET | Freq: Two times a day (BID) | ORAL | 1 refills | Status: DC

## 2021-02-05 NOTE — Telephone Encounter (Signed)
Spoke to wife.  Aware to stop Tikosyn. Aware DCCV instructions will be sent via mychart. Wife appreciative of call

## 2021-02-08 ENCOUNTER — Ambulatory Visit (HOSPITAL_COMMUNITY)
Admission: RE | Admit: 2021-02-08 | Discharge: 2021-02-08 | Disposition: A | Discharge status: Home / Self Care | Payer: Medicare PPO | Attending: Cardiology | Admitting: Cardiology

## 2021-02-08 ENCOUNTER — Other Ambulatory Visit: Payer: Self-pay

## 2021-02-08 ENCOUNTER — Encounter (HOSPITAL_COMMUNITY)
Admission: RE | Disposition: A | Discharge status: Home / Self Care | Payer: Self-pay | Source: Home / Self Care | Attending: Cardiology

## 2021-02-08 ENCOUNTER — Ambulatory Visit (HOSPITAL_COMMUNITY): Payer: Medicare PPO | Admitting: Anesthesiology

## 2021-02-08 DIAGNOSIS — Z9989 Dependence on other enabling machines and devices: Secondary | ICD-10-CM | POA: Diagnosis not present

## 2021-02-08 DIAGNOSIS — Z85820 Personal history of malignant melanoma of skin: Secondary | ICD-10-CM | POA: Insufficient documentation

## 2021-02-08 DIAGNOSIS — I4892 Unspecified atrial flutter: Secondary | ICD-10-CM | POA: Insufficient documentation

## 2021-02-08 DIAGNOSIS — G4733 Obstructive sleep apnea (adult) (pediatric): Secondary | ICD-10-CM | POA: Insufficient documentation

## 2021-02-08 DIAGNOSIS — Z7951 Long term (current) use of inhaled steroids: Secondary | ICD-10-CM | POA: Diagnosis not present

## 2021-02-08 DIAGNOSIS — Z91030 Bee allergy status: Secondary | ICD-10-CM | POA: Diagnosis not present

## 2021-02-08 DIAGNOSIS — H5461 Unqualified visual loss, right eye, normal vision left eye: Secondary | ICD-10-CM | POA: Diagnosis not present

## 2021-02-08 DIAGNOSIS — Z7901 Long term (current) use of anticoagulants: Secondary | ICD-10-CM | POA: Insufficient documentation

## 2021-02-08 DIAGNOSIS — I4819 Other persistent atrial fibrillation: Secondary | ICD-10-CM | POA: Insufficient documentation

## 2021-02-08 DIAGNOSIS — Z880 Allergy status to penicillin: Secondary | ICD-10-CM | POA: Diagnosis not present

## 2021-02-08 DIAGNOSIS — Z888 Allergy status to other drugs, medicaments and biological substances status: Secondary | ICD-10-CM | POA: Insufficient documentation

## 2021-02-08 DIAGNOSIS — Z8249 Family history of ischemic heart disease and other diseases of the circulatory system: Secondary | ICD-10-CM | POA: Diagnosis not present

## 2021-02-08 DIAGNOSIS — Z8619 Personal history of other infectious and parasitic diseases: Secondary | ICD-10-CM | POA: Diagnosis not present

## 2021-02-08 DIAGNOSIS — N183 Chronic kidney disease, stage 3 unspecified: Secondary | ICD-10-CM | POA: Insufficient documentation

## 2021-02-08 DIAGNOSIS — Z79899 Other long term (current) drug therapy: Secondary | ICD-10-CM | POA: Insufficient documentation

## 2021-02-08 DIAGNOSIS — I129 Hypertensive chronic kidney disease with stage 1 through stage 4 chronic kidney disease, or unspecified chronic kidney disease: Secondary | ICD-10-CM | POA: Diagnosis not present

## 2021-02-08 DIAGNOSIS — Z961 Presence of intraocular lens: Secondary | ICD-10-CM | POA: Diagnosis not present

## 2021-02-08 DIAGNOSIS — Z885 Allergy status to narcotic agent status: Secondary | ICD-10-CM | POA: Insufficient documentation

## 2021-02-08 DIAGNOSIS — J449 Chronic obstructive pulmonary disease, unspecified: Secondary | ICD-10-CM | POA: Insufficient documentation

## 2021-02-08 DIAGNOSIS — Z9001 Acquired absence of eye: Secondary | ICD-10-CM | POA: Diagnosis not present

## 2021-02-08 DIAGNOSIS — C6901 Malignant neoplasm of right conjunctiva: Secondary | ICD-10-CM | POA: Diagnosis not present

## 2021-02-08 HISTORY — PX: CARDIOVERSION: SHX1299

## 2021-02-08 LAB — POCT I-STAT, CHEM 8
BUN: 19 mg/dL (ref 8–23)
Calcium, Ion: 1.07 mmol/L — ABNORMAL LOW (ref 1.15–1.40)
Chloride: 107 mmol/L (ref 98–111)
Creatinine, Ser: 1.4 mg/dL — ABNORMAL HIGH (ref 0.61–1.24)
Glucose, Bld: 91 mg/dL (ref 70–99)
HCT: 36 % — ABNORMAL LOW (ref 39.0–52.0)
Hemoglobin: 12.2 g/dL — ABNORMAL LOW (ref 13.0–17.0)
Potassium: 3.9 mmol/L (ref 3.5–5.1)
Sodium: 142 mmol/L (ref 135–145)
TCO2: 24 mmol/L (ref 22–32)

## 2021-02-08 SURGERY — CARDIOVERSION
Anesthesia: General

## 2021-02-08 MED ORDER — LIDOCAINE HCL (CARDIAC) PF 100 MG/5ML IV SOSY
PREFILLED_SYRINGE | INTRAVENOUS | Status: DC | PRN
  Administered 2021-02-08: 50 mg via INTRAVENOUS

## 2021-02-08 MED ORDER — PROPOFOL 10 MG/ML IV BOLUS
INTRAVENOUS | Status: DC | PRN
  Administered 2021-02-08: 50 mg via INTRAVENOUS

## 2021-02-08 MED ORDER — SODIUM CHLORIDE 0.9 % IV SOLN
INTRAVENOUS | Status: AC | PRN
  Administered 2021-02-08: 500 mL via INTRAMUSCULAR

## 2021-02-08 NOTE — Anesthesia Preprocedure Evaluation (Addendum)
Anesthesia Evaluation  Patient identified by MRN, date of birth, ID band Patient awake    Reviewed: Allergy & Precautions, NPO status , Patient's Chart, lab work & pertinent test results, reviewed documented beta blocker date and time   Airway Mallampati: II       Dental  (+) Teeth Intact, Caps, Dental Advisory Given   Pulmonary asthma , sleep apnea and Continuous Positive Airway Pressure Ventilation , COPD,  COPD inhaler,    Pulmonary exam normal breath sounds clear to auscultation       Cardiovascular hypertension, Pt. on medications and Pt. on home beta blockers + dysrhythmias Atrial Fibrillation  Rhythm:Irregular Rate:Normal     Neuro/Psych negative neurological ROS  negative psych ROS   GI/Hepatic negative GI ROS, Neg liver ROS,   Endo/Other  Hyperlipidemia Gout  Renal/GU Renal InsufficiencyRenal disease  negative genitourinary   Musculoskeletal   Abdominal   Peds  Hematology  (+) anemia , Xarelto therapy- last dose yesterday   Anesthesia Other Findings   Reproductive/Obstetrics                            Anesthesia Physical Anesthesia Plan  ASA: 3  Anesthesia Plan: General   Post-op Pain Management:    Induction: Intravenous  PONV Risk Score and Plan: 3 and Treatment may vary due to age or medical condition and Ondansetron  Airway Management Planned: Natural Airway and Mask  Additional Equipment:   Intra-op Plan:   Post-operative Plan:   Informed Consent: I have reviewed the patients History and Physical, chart, labs and discussed the procedure including the risks, benefits and alternatives for the proposed anesthesia with the patient or authorized representative who has indicated his/her understanding and acceptance.     Dental advisory given  Plan Discussed with: CRNA and Anesthesiologist  Anesthesia Plan Comments:         Anesthesia Quick Evaluation

## 2021-02-08 NOTE — Anesthesia Procedure Notes (Signed)
Procedure Name: General with mask airway Date/Time: 02/08/2021 9:25 AM Performed by: Griffin Dakin, CRNA Pre-anesthesia Checklist: Patient identified, Emergency Drugs available, Suction available, Patient being monitored and Timeout performed Patient Re-evaluated:Patient Re-evaluated prior to induction Oxygen Delivery Method: Ambu bag Induction Type: IV induction Ventilation: Mask ventilation without difficulty Placement Confirmation: positive ETCO2 and breath sounds checked- equal and bilateral Dental Injury: Teeth and Oropharynx as per pre-operative assessment

## 2021-02-08 NOTE — Transfer of Care (Signed)
Immediate Anesthesia Transfer of Care Note  Patient: Austin [name]  Procedure(s) Performed: CARDIOVERSION  Patient Location: Endoscopy Unit  Anesthesia Type:General  Level of Consciousness: awake, alert  and oriented  Airway & Oxygen Therapy: Patient Spontanous Breathing  Post-op Assessment: Report given to RN and Post -op Vital signs reviewed and stable  Post vital signs: Reviewed and stable  Last Vitals:  Vitals Value Taken Time  BP    Temp    Pulse 55 02/08/21 0931  Resp 21 02/08/21 0931  SpO2 100 % 02/08/21 0931  Vitals shown include unvalidated device data.  Last Pain:  Vitals:   02/08/21 0826  TempSrc: Temporal  PainSc: 0-No pain         Complications: No notable events documented.

## 2021-02-08 NOTE — Interval H&P Note (Signed)
History and Physical Interval Note:  02/08/2021 8:33 AM  Austin [name]  has presented today for surgery, with the diagnosis of AFIB.  The various methods of treatment have been discussed with the patient and family. After consideration of risks, benefits and other options for treatment, the patient has consented to  Procedure(s): CARDIOVERSION ([name]) as a surgical intervention.  The patient's history has been reviewed, patient examined, no change in status, stable for surgery.  I have reviewed the patient's chart and labs.  Questions were answered to the patient's satisfaction.     [employer]

## 2021-02-08 NOTE — Anesthesia Postprocedure Evaluation (Signed)
Anesthesia Post Note  Patient: Austin [name]  Procedure(s) Performed: CARDIOVERSION     Patient location during evaluation: PACU Anesthesia Type: General Level of consciousness: awake and alert and oriented Pain management: pain level controlled Vital Signs Assessment: post-procedure vital signs reviewed and stable Respiratory status: spontaneous breathing, nonlabored ventilation and respiratory function stable Cardiovascular status: blood pressure returned to baseline and stable Postop Assessment: no apparent nausea or vomiting Anesthetic complications: no   No notable events documented.  Last Vitals:  Vitals:   02/08/21 0938 02/08/21 0950  BP: 123/80 (!) 142/80  Pulse: (!) 54 (!) 54  Resp: 13 20  Temp:    SpO2: 97% 96%    Last Pain:  Vitals:   02/08/21 0950  TempSrc:   PainSc: 0-No pain                 Kameisha Malicki A.

## 2021-02-08 NOTE — CV Procedure (Signed)
    Electrical Cardioversion Procedure Note Austin [name] [id number] [birth year]  Procedure: Electrical Cardioversion Indications:  Atrial Fibrillation  Time Out: Verified patient identification, verified procedure,medications/allergies/relevent history reviewed, required imaging and test results available.  Performed  Procedure Details  The patient was NPO after midnight. Anesthesia was administered at the beside  by Dr.Foster with 50mg  of propofol.  Cardioversion was performed with synchronized biphasic defibrillation via AP pads with 200 joules.  1 attempt(s) were performed.  The patient converted to normal sinus rhythm. The patient tolerated the procedure well   IMPRESSION:  Successful cardioversion of atrial fibrillation    Candee Furbish 02/08/2021, 9:33 AM

## 2021-02-10 ENCOUNTER — Encounter (HOSPITAL_COMMUNITY): Payer: Self-pay | Admitting: Cardiology

## 2021-02-10 DIAGNOSIS — M109 Gout, unspecified: Secondary | ICD-10-CM | POA: Diagnosis not present

## 2021-02-10 DIAGNOSIS — Z79899 Other long term (current) drug therapy: Secondary | ICD-10-CM | POA: Diagnosis not present

## 2021-02-10 DIAGNOSIS — Z125 Encounter for screening for malignant neoplasm of prostate: Secondary | ICD-10-CM | POA: Diagnosis not present

## 2021-02-10 DIAGNOSIS — I129 Hypertensive chronic kidney disease with stage 1 through stage 4 chronic kidney disease, or unspecified chronic kidney disease: Secondary | ICD-10-CM | POA: Diagnosis not present

## 2021-02-17 DIAGNOSIS — N1831 Chronic kidney disease, stage 3a: Secondary | ICD-10-CM | POA: Diagnosis not present

## 2021-02-17 DIAGNOSIS — I4891 Unspecified atrial fibrillation: Secondary | ICD-10-CM | POA: Diagnosis not present

## 2021-02-17 DIAGNOSIS — J449 Chronic obstructive pulmonary disease, unspecified: Secondary | ICD-10-CM | POA: Diagnosis not present

## 2021-02-17 DIAGNOSIS — K869 Disease of pancreas, unspecified: Secondary | ICD-10-CM | POA: Diagnosis not present

## 2021-02-17 DIAGNOSIS — R82998 Other abnormal findings in urine: Secondary | ICD-10-CM | POA: Diagnosis not present

## 2021-02-17 DIAGNOSIS — N400 Enlarged prostate without lower urinary tract symptoms: Secondary | ICD-10-CM | POA: Diagnosis not present

## 2021-02-17 DIAGNOSIS — I129 Hypertensive chronic kidney disease with stage 1 through stage 4 chronic kidney disease, or unspecified chronic kidney disease: Secondary | ICD-10-CM | POA: Diagnosis not present

## 2021-02-17 DIAGNOSIS — Z0000 Encounter for general adult medical examination without abnormal findings: Secondary | ICD-10-CM | POA: Diagnosis not present

## 2021-02-17 DIAGNOSIS — Z7901 Long term (current) use of anticoagulants: Secondary | ICD-10-CM | POA: Diagnosis not present

## 2021-02-17 DIAGNOSIS — Z1331 Encounter for screening for depression: Secondary | ICD-10-CM | POA: Diagnosis not present

## 2021-02-17 DIAGNOSIS — Z1339 Encounter for screening examination for other mental health and behavioral disorders: Secondary | ICD-10-CM | POA: Diagnosis not present

## 2021-03-02 ENCOUNTER — Telehealth: Payer: Self-pay | Admitting: Cardiology

## 2021-03-02 NOTE — Telephone Encounter (Signed)
Started Multaq 2 weeks ago 02/06/21, 2 days before DCCV.  Since he has been SOB, weak, swelling feet and ankles and coughing at night and wheezing terrible all day long and at night.  Reports he has COPD and asthma but it has never been this bad.  Weight stable.   BP 137/80, 61 122/81, 63.    Tikosyn was stopped when multaq started.  Removing Tikosyn from medication list at this time.  Reviewed with Dr. Lovena Le (DOD) who recommends pt stop Multaq, avoid salt and take an extra lasix now.  Have Dr. Curt Bears review and make recommendations for further treatment/plan of care.  Reviewed this w patient's wife who voices understanding and agreement.  She is aware she will be contacted with further recommendations.  She is aware she can call anytime if needed to reach on call provider or always ok to go to ER if they feel that's whats needed.  She is grateful for assistance.

## 2021-03-02 NOTE — Telephone Encounter (Signed)
Pt c/o Shortness Of Breath: STAT if SOB developed within the last 24 hours or pt is noticeably SOB on the phone  1. Are you currently SOB (can you hear that pt is SOB on the phone)? yes  2. How long have you been experiencing SOB? Two weeks   3. Are you SOB when sitting or when up moving around? All the time   4. Are you currently experiencing any other symptoms? No

## 2021-03-03 NOTE — Telephone Encounter (Signed)
Followed up with wife. She reports that pt has improved since stopping Multaq yesterday.  SOB and edema have both improved slightly. Aware that I will forward to Dr. Curt Bears for advisement. Wife is agreeable to plan.

## 2021-03-04 NOTE — Telephone Encounter (Signed)
Patient's wife calling back. She requests a call back from a triage nurse. She states she is "at her wits' end with this".

## 2021-03-05 NOTE — Telephone Encounter (Signed)
Called and left message for patient/spouse to call [facility] to schedule appt.

## 2021-03-05 NOTE — Telephone Encounter (Addendum)
Spoke to wife who reports pt continues to improve. Apologized for not getting back with her yesterday.  She said it was fine and she was just following up. Aware that Dr. Curt Bears recommends f/u in the AFib clinic to discuss medication options. Aware I will forward to them for appt scheduling. Advised to call back if issues/symptoms arise. Wife verbalized understanding and agreeable to plan.

## 2021-03-09 ENCOUNTER — Ambulatory Visit (HOSPITAL_COMMUNITY)
Admission: RE | Admit: 2021-03-09 | Discharge: 2021-03-09 | Disposition: A | Discharge status: Home / Self Care | Payer: Medicare PPO | Source: Ambulatory Visit | Attending: Nurse Practitioner | Admitting: Nurse Practitioner

## 2021-03-09 ENCOUNTER — Encounter (HOSPITAL_COMMUNITY): Payer: Self-pay | Admitting: Nurse Practitioner

## 2021-03-09 ENCOUNTER — Other Ambulatory Visit: Payer: Self-pay

## 2021-03-09 VITALS — BP 160/78 | HR 63 | Ht 69.0 in | Wt 159.4 lb

## 2021-03-09 DIAGNOSIS — Z880 Allergy status to penicillin: Secondary | ICD-10-CM | POA: Diagnosis not present

## 2021-03-09 DIAGNOSIS — Z7901 Long term (current) use of anticoagulants: Secondary | ICD-10-CM | POA: Insufficient documentation

## 2021-03-09 DIAGNOSIS — I4819 Other persistent atrial fibrillation: Secondary | ICD-10-CM | POA: Diagnosis not present

## 2021-03-09 DIAGNOSIS — Z91041 Radiographic dye allergy status: Secondary | ICD-10-CM | POA: Insufficient documentation

## 2021-03-09 DIAGNOSIS — G4733 Obstructive sleep apnea (adult) (pediatric): Secondary | ICD-10-CM | POA: Insufficient documentation

## 2021-03-09 DIAGNOSIS — D6869 Other thrombophilia: Secondary | ICD-10-CM

## 2021-03-09 DIAGNOSIS — Z888 Allergy status to other drugs, medicaments and biological substances status: Secondary | ICD-10-CM | POA: Insufficient documentation

## 2021-03-09 DIAGNOSIS — Z8249 Family history of ischemic heart disease and other diseases of the circulatory system: Secondary | ICD-10-CM | POA: Diagnosis not present

## 2021-03-09 DIAGNOSIS — I129 Hypertensive chronic kidney disease with stage 1 through stage 4 chronic kidney disease, or unspecified chronic kidney disease: Secondary | ICD-10-CM | POA: Diagnosis not present

## 2021-03-09 DIAGNOSIS — N183 Chronic kidney disease, stage 3 unspecified: Secondary | ICD-10-CM | POA: Insufficient documentation

## 2021-03-09 DIAGNOSIS — Z79899 Other long term (current) drug therapy: Secondary | ICD-10-CM | POA: Insufficient documentation

## 2021-03-09 DIAGNOSIS — Z885 Allergy status to narcotic agent status: Secondary | ICD-10-CM | POA: Insufficient documentation

## 2021-03-09 DIAGNOSIS — Z7951 Long term (current) use of inhaled steroids: Secondary | ICD-10-CM | POA: Insufficient documentation

## 2021-03-09 NOTE — Progress Notes (Signed)
Patient ID: KIYAN BURMESTER, male   DOB: [birth year], 87 y.o.   MRN: [id number]     Primary Care Physician: Burnard Bunting, MD Referring Physician: [facility] f/u EP: Dr. Reggy Eye DONTRELLE MAZON is a 87 y.o. male with a h/o persistent afib that failed flecainide, amiodarone and was admitted and loaded on tikosyn 3/27 thru 3/30. Renal function and electrolytes were followed during the hospitalization. QTc had prolongation and required down-titration of  Tiksoyn with improvement and stable QTc, his EKG reviewed by Dr. Curt Bears.  He converted to sinus rhythm 08/12/15.  He was monitored until discharge on telemetry which demonstrated SR.  F/u in afib clinic today, 09/24/19.He was recently seen by [name], PA at the [facility] office and he was in rhythm. QTc was 495 ms. There was a question if Tikosyn needed to be stopped for  qt concerns and changed  to amiodarone. He is on lowest dose of tikosyn at 125 mcg bid.  In the office here today, his qtc is 455 ms and he is in rhythm. He does not want change in therapy. He did try amiodarone briefly at one time, since he only has one eye, he was concerned that amiodarone would negatively effect his vision.  He continues on xarelto 15 mg daily for a CHA2DS2VASc of 3.   F/u in afib clinic, 8/25,he continues on Tikosyn and is maintaining SR. He is pending having his rt eye removed for ongoing pain/ha's with h/o of melanoma/shingles and  infection in the eye. He was advised to hold xarelto x 3 days. Qtc is stable.   F/u in afib clinic, 04/13/20. He is here for surveillance of Tikosyn use. He is not having any afib with Tikosyn use. He is pending more eye surgery Wednesday for melanoma found inside the socket when  eye was removed late summer. He is off DOAC today and will restart Wednesday pm. Qtc is stable.   F/u in afib clinic, 08/19/20. He is reporting fatigue and shortness of breath. Saw his PCP this last week was very weak and had a systolic BP in the 75'Z.  PCP felt he may have a UTI and started on antibiotics and also felt that he may be mildly dehydrated. He is staying in [place]. His HR though is 51 bpm and maybe bradycardia from BB may be contributing to symptoms. If this  does not help will need to discuss obtaining  a stress test. Last stress test was in 2017 did not show any ischemia. He reports that at the beach recently he did have shortness of breath carrying items up the stairs that was much worse that in previous years.   F/u in afib clinic, 12/30/20.pt is here for his usual tikosyn surveillance visit but is found to be out of rhythm  Ekg shows that he is out of rhythm in afib at 66 bpm. He has not felt up to par recently since stepping on a hornet's nest 7/23 with known allergy to bee stings. The wife gave him an epi pen and he proceeded to the ER. Ekg then showed afib in the 60's. He was treated with prednisone and d/c home. He then went back to the ER 8/2 with abdominal pain. The cause of this ws thought to be constipation from prednisone. EKG at that visit afib in the 60's. He then went to the beach and got very week. He was treated at [facility] with dehydration. He was not admitted and I do not see  where an EKG was performed. Cardioversion was discussed and he would like to proceed as he is still feeling weaker than usual. No missed anticoagulation. He remains on Tikosyn 125 mcg bid.  F/u in afib clinic, 01/12/22. He had a successful cardioversion 01/04/21. Today in the clinic, Unfortunately, he has returned to rate controlled atrial flutter. He did have more energy after the cardioversion x 2 days. He appears to be failing cardioversion, as he was out of rhythm for a few months before I saw him. He only has one eye and is concerned regarding  amiodarone affecting his sight. For now, I will leave everything in place but would like him to discuss further with Dr. Curt Bears. His wife thinks using the wheelbarrow at the beach caused him to get out of  rhythm.   F/u in afib clinic, 03/09/21. He saw Dr. Curt Bears after my last visit. Tikosyn was stopped and he was placed on Multaq. He had a successful cardioversion but had ERAF. He is here to discuss further options. He felt terrible on Multaq with increased shortness of breath, and since being off that drug feels much improved. He  is nicely rate controlled and will continue to go forward with rate controlled afib as his management option. He has failed multaq, flecainide, tikoyn and stopped amiodarone as he was afraid of retinal deposits affecting his vision as he only has one eye. His age limits ablation as an option.   Today, he denies symptoms of palpitations, chest pain, shortness of breath, orthopnea, PND, lower extremity edema, dizziness, presyncope, syncope, or neurologic sequela. The patient is tolerating medications without difficulties and is otherwise without complaint today.   Past Medical History:  Diagnosis Date   Allergy    Asthma    BPH (benign prostatic hypertrophy)    CKD (chronic kidney disease) stage 3, GFR 30-59 ml/min (HCC)    Cough    Gout    Hypertensive chronic kidney disease    Melanoma in situ of right eyelid (HCC)    OSA (obstructive sleep apnea)    Persistent atrial fibrillation [facility])    Past Surgical History:  Procedure Laterality Date   CARDIOVERSION [name] 06/05/2015   Procedure: CARDIOVERSION;  Surgeon: Sanda Klein, MD;  Location: [place];  Service: Cardiovascular;  Laterality: [name];   CARDIOVERSION [name] 01/04/2021   Procedure: CARDIOVERSION;  Surgeon: [name], MD;  Location: [place];  Service: Cardiovascular;  Laterality: [name];   CARDIOVERSION [name] 02/08/2021   Procedure: CARDIOVERSION;  Surgeon: Jerline Pain, MD;  Location: [place] [facility];  Service: Cardiovascular;  Laterality: [name];   EYE SURGERY  12/2017    Current Outpatient Medications  Medication Sig Dispense Refill   acetaminophen (TYLENOL) 500 MG tablet Take 500 mg by mouth every  6 (six) hours as needed (pain.).     albuterol (VENTOLIN HFA) 108 (90 Base) MCG/ACT inhaler Inhale 2 puffs into the lungs every 4 (four) hours as needed for wheezing or shortness of breath (cough).     amLODipine (NORVASC) 2.5 MG tablet TAKE 1 TABLET BY MOUTH EVERY DAY (Patient taking differently: Take 2.5 mg by mouth daily with lunch.) 90 tablet 1   budesonide-formoterol (SYMBICORT) 80-4.5 MCG/ACT inhaler Inhale 2 puffs into the lungs 2 (two) times daily.      Butalbital-APAP-Caff-Cod 50-300-40-30 MG CAPS Take 1 capsule by mouth every 4 (four) hours as needed (migraines).     carvedilol (COREG) 12.5 MG tablet Take 0.5 tablets (6.25 mg total) by mouth 2 (two) times daily. (Patient  taking differently: Take 12.5 mg by mouth 2 (two) times daily.) 180 tablet 1   clobetasol cream (TEMOVATE) 7.10 % Apply 1 application topically 2 (two) times daily as needed (for itching).     DM-APAP-CPM (CORICIDIN HBP) 10-325-2 MG TABS Take 1 tablet by mouth daily.     EPINEPHrine 0.3 mg/0.3 mL IJ SOAJ injection Inject 0.3 mg into the muscle as needed for anaphylaxis. 1 each 0   fluorouracil (EFUDEX) 5 % cream Apply 1 application topically 2 (two) times daily.     fluticasone (FLONASE) 50 MCG/ACT nasal spray Place 2 sprays into both nostrils in the morning and at bedtime.  8   furosemide (LASIX) 20 MG tablet Take 1 tablet (20 mg total) by mouth daily. 90 tablet 1   guaiFENesin (MUCINEX) 600 MG 12 hr tablet Take 600 mg by mouth in the morning.     hydrOXYzine (ATARAX/VISTARIL) 25 MG tablet Take 50 mg by mouth at bedtime. itching     indomethacin (INDOCIN) 50 MG capsule Take 50 mg by mouth as needed (for gout flare up).     isosorbide mononitrate (IMDUR) 60 MG 24 hr tablet TAKE 1 TABLET BY MOUTH EVERY DAY 90 tablet 2   KLOR-CON M20 20 MEQ tablet TAKE 1 TABLET BY MOUTH EVERY DAY 90 tablet 3   lidocaine-prilocaine (EMLA) cream Apply 1 application topically as needed (itching).     MAGNESIUM CITRATE PO Take 250 mg by mouth  at bedtime.     olmesartan (BENICAR) 40 MG tablet TAKE 1 TABLET BY MOUTH EVERY DAY. *REPLACES LOSARTAN (Patient taking differently: Take 40 mg by mouth at bedtime.) 90 tablet 2   Polyethyl Glycol-Propyl Glycol 0.4-0.3 % SOLN Place 1 drop into the left eye 2 (two) times daily. Systane     promethazine (PHENERGAN) 25 MG tablet Take 25 mg by mouth every 6 (six) hours as needed for nausea or vomiting.     sertraline (ZOLOFT) 50 MG tablet Take 50 mg by mouth at bedtime.     valACYclovir (VALTREX) 1000 MG tablet Take 1,000 mg by mouth at bedtime.     XARELTO 15 MG TABS tablet TAKE 1 TABLET BY MOUTH EVERY DAY WITH SUPPER 90 tablet 1   zolpidem (AMBIEN) 10 MG tablet Take 10 mg by mouth at bedtime.  5   No current facility-administered medications for this encounter.    Allergies  Allergen Reactions   Bee Venom Anaphylaxis   Other Anaphylaxis   Diatrizoate Other (See Comments)   Diltiazem Other (See Comments)    Severe headaches 120 mg BID   Yohimbine Other (See Comments)   Codeine Nausea And Vomiting   Ivp Dye [Iodinated Diagnostic Agents] Rash   Penicillins Swelling and Rash    Has patient had a PCN reaction causing immediate rash, facial/tongue/throat swelling, SOB or lightheadedness with hypotension: Yes Has patient had a PCN reaction causing severe rash involving mucus membranes or skin necrosis: No Has patient had a PCN reaction that required hospitalization No Has patient had a PCN reaction occurring within the last 10 years: No If all of the above answers are "NO", then may proceed with Cephalosporin use.   Tape Itching and Rash    Redness.  Please use "paper" tape only    Social History   Socioeconomic History   Marital status: Married    Spouse name: [name]   Number of children: Not on file   Years of education: Not on file   Highest education level: Not on file  Occupational History   Not on file  Tobacco Use   Smoking status: Never   Smokeless tobacco: Never  Substance  and Sexual Activity   Alcohol use: Yes    Alcohol/week: 1.0 standard drink    Types: 1 Standard drinks or equivalent per week    Comment: occ   Drug use: No   Sexual activity: Not on file  Other Topics Concern   Not on file  Social History Narrative   Right Handed.  Caffiene  Coffee, tea 3 cups daily.   Pt retired.     Lives at home with wife.     Social Determinants of Health   Financial Resource Strain: Not on file  Food Insecurity: Not on file  Transportation Needs: Not on file  Physical Activity: Not on file  Stress: Not on file  Social Connections: Not on file  Intimate Partner Violence: Not on file    Family History  Problem Relation Age of Onset   Hypertension Father    Lung cancer Father    Hypertension Mother     ROS- All systems are reviewed and negative except as per the HPI above  Physical Exam: Vitals:   03/09/21 0920  BP: (!) 160/78  Pulse: 63  Weight: 72.3 kg  Height: 5\' 9"  (1.753 m)     GEN- The patient is well appearing, alert and oriented x 3 today.   Head- normocephalic, atraumatic Eyes-  Sclera clear, conjunctiva pink. Blind rt eye Ears- hearing intact Oropharynx- clear Neck- supple, no JVP Lymph- no cervical lymphadenopathy Lungs- Clear to ausculation bilaterally, normal work of breathing Heart- irregular rate and rhythm, no murmurs, rubs or gallops, PMI not laterally displaced GI- soft, NT, ND, + BS Extremities- no clubbing, cyanosis, or edema MS- no significant deformity or atrophy Skin- no rash or lesion Psych- euthymic mood, full affect Neuro- strength and sensation are intact  EKG- atrial fibrillation 63 bpm, qrs int 96 ms, qtc 429 ms   Epic records reviewed  2021-Echo- . Left ventricular ejection fraction, by estimation, is 60 to 65%. The left ventricle has normal function. The left ventricle has no regional wall motion abnormalities. There is severe left ventricular hypertrophy. Left ventricular diastolic parameters are  consistent with Grade II diastolic dysfunction (pseudonormalization). Elevated left atrial pressure. 2. Right ventricular systolic function is mildly reduced. The right ventricular size is normal. There is normal pulmonary artery systolic pressure. 3. The mitral valve is normal in structure. Trivial mitral valve regurgitation. 4. The aortic valve was not well visualized. Aortic valve regurgitation is mild. Mild aortic valve stenosis. Vmax 2.3 m/s, Mg 9 mmHg, AVA 1.3 cm^2, DI 0.43 5. Aortic dilatation noted. There is mild dilatation of the aortic root measuring 40 mm. Mild dilatation of the ascending aorta measuring 70mm 6. Left atrial size was mildly dilated. 7. Severe asymmetric hypertrophy of basal septum. Also with apical hypertrophy with cavity obliteration during systole. Consider cardiac MRI to evaluate for hypertrophic cardiomyopathy or amyloidosis.  2017-Nuclear stress EF: 55%. There was no ST segment deviation noted during stress. Patient was in atrial fibrillation throughout the study. There is a small defect of mild severity present in the basal inferolateral and mid inferolateral location. The defect is non-reversible. There is a small defect of mild severity present in the basal inferior and mid inferior location. The defect is non-reversible.There is a small defect of mild severity present in the apex location. The defect is non-reversible. No ischemia noted. This is a low risk study. The  left ventricular ejection fraction is normal (55-65%).    Assessment and Plan: 1. Persistent afib Has been maintaining SR on Tikosyn  until July of this summer when received a bee sting. EPI pen and went into afib.   He had successful cardioversion 8/22 but possibly only lasted 2 days.  With ERAF, he saw Dr. Curt Bears and Phyllis Ginger was stopped, Multaq was started and he had another successful cardioversion but EFAF He stopped Multaq ( made him feel terrible and have shortness of breath) and he  actually feels better with rate controlled afib He used amiodarone in the past but stopped over concern  for possible retinal deposits  and vision loss in his only eye, he still does not want to use this drug  2. CHA2DS2VASc score  of 3 Continue xarelto 15 mg qd   F/u with Dr. Curt Bears 11/10   Geroge Baseman. Elinore Shults, [facility] [address] [place], [place] [address] [phone]

## 2021-03-10 MED ORDER — CARVEDILOL 12.5 MG PO TABS: 12.5000 mg | ORAL_TABLET | Freq: Two times a day (BID) | ORAL | Status: DC

## 2021-03-10 NOTE — Addendum Note (Signed)
Encounter addended by: Enid Derry, CMA on: 03/10/2021 10:08 AM  Actions taken: Order list changed

## 2021-03-16 DIAGNOSIS — R159 Full incontinence of feces: Secondary | ICD-10-CM | POA: Diagnosis not present

## 2021-03-16 DIAGNOSIS — Z86010 Personal history of colonic polyps: Secondary | ICD-10-CM | POA: Diagnosis not present

## 2021-03-16 DIAGNOSIS — R195 Other fecal abnormalities: Secondary | ICD-10-CM | POA: Diagnosis not present

## 2021-03-22 ENCOUNTER — Ambulatory Visit: Payer: Medicare PPO | Admitting: Adult Health

## 2021-03-22 ENCOUNTER — Encounter: Payer: Self-pay | Admitting: Adult Health

## 2021-03-22 VITALS — BP 121/79 | HR 59 | Ht 68.0 in | Wt 161.8 lb

## 2021-03-22 DIAGNOSIS — G4733 Obstructive sleep apnea (adult) (pediatric): Secondary | ICD-10-CM

## 2021-03-22 DIAGNOSIS — J449 Chronic obstructive pulmonary disease, unspecified: Secondary | ICD-10-CM

## 2021-03-22 DIAGNOSIS — Z9989 Dependence on other enabling machines and devices: Secondary | ICD-10-CM

## 2021-03-22 NOTE — Progress Notes (Signed)
PATIENT: Austin [name] DOB: 1930-11-20  REASON FOR VISIT: follow up HISTORY FROM: patient   HISTORY OF PRESENT ILLNESS: Today 03/22/21 Austin [name] is a 87 y.o male with a history of OSA on CPAP. He returns today for a follow up. His download below demonstrates that he wears his CPAP for 28 of the 30 days for a compliance of 93%. He is wearing it for >4 hours for 24 of those days. He is on an auto set pressure between 6 and 10 cmH2O with a good treatment of AHI 1.3. He reports that he feels that there is hot air blowing when he turns on the machine. I showed the patient where to turn down the humidification on his machine and ordered the heated tubing to be turned off through his DME company for him.          HISTORY  09/10/20 Austin [name] is an 87 year old male with a history of obstructive sleep apnea on CPAP.  He returns today for follow-up.  He states that he has been having a hard time using the CPAP as he feels that the air is too hot.  He also states that the mask straps has been irritating the sides of his face.   03/11/20: Austin [name] is an 87 year old male with a history of obstructive sleep apnea on CPAP.  He returns today for follow-up.  He reports that he has 2 machines.  [place]  He states that he recently went to the beach for a weekend plus he also had 1 week that he was unable to use the machine as it was being fixed.  His download indicates that he uses machine 14 out of 30 days for compliance of 47%.  He uses machine greater than 4 hours each night.  On average he uses his machine 9 hours and 5 minutes.  His residual AHI is 0.9 on 6 to 10 cm of water with EPR 3.  Leak in the 95th percentile is 6.5 L/min.  He returns today for an evaluation.  REVIEW OF SYSTEMS: Out of a complete 14 system review of symptoms, the patient complains only of the following symptoms, and all other reviewed systems are negative.  FSS ESS  ALLERGIES: Allergies   Allergen Reactions   Bee Venom Anaphylaxis   Other Anaphylaxis   Diatrizoate Other (See Comments)   Diltiazem Other (See Comments)    Severe headaches 120 mg BID   Yohimbine Other (See Comments)   Codeine Nausea And Vomiting   Ivp Dye [Iodinated Diagnostic Agents] Rash   Penicillins Swelling and Rash    Has patient had a PCN reaction causing immediate rash, facial/tongue/throat swelling, SOB or lightheadedness with hypotension: Yes Has patient had a PCN reaction causing severe rash involving mucus membranes or skin necrosis: No Has patient had a PCN reaction that required hospitalization No Has patient had a PCN reaction occurring within the last 10 years: No If all of the above answers are "NO", then may proceed with Cephalosporin use.   Tape Itching and Rash    Redness.  Please use "paper" tape only    HOME MEDICATIONS: Outpatient Medications Prior to Visit  Medication Sig Dispense Refill   acetaminophen (TYLENOL) 500 MG tablet Take 500 mg by mouth every 6 (six) hours as needed (pain.).     albuterol (VENTOLIN HFA) 108 (90 Base) MCG/ACT inhaler Inhale 2 puffs into the lungs every 4 (four) hours as needed for wheezing or  shortness of breath (cough).     amLODipine (NORVASC) 2.5 MG tablet TAKE 1 TABLET BY MOUTH EVERY DAY (Patient taking differently: Take 2.5 mg by mouth daily with lunch.) 90 tablet 1   budesonide-formoterol (SYMBICORT) 80-4.5 MCG/ACT inhaler Inhale 2 puffs into the lungs 2 (two) times daily.      Butalbital-APAP-Caff-Cod 50-300-40-30 MG CAPS Take 1 capsule by mouth every 4 (four) hours as needed (migraines).     carvedilol (COREG) 12.5 MG tablet Take 1 tablet (12.5 mg total) by mouth 2 (two) times daily.     clobetasol cream (TEMOVATE) 6.81 % Apply 1 application topically 2 (two) times daily as needed (for itching).     DM-APAP-CPM (CORICIDIN HBP) 10-325-2 MG TABS Take 1 tablet by mouth daily.     EPINEPHrine 0.3 mg/0.3 mL IJ SOAJ injection Inject 0.3 mg into the  muscle as needed for anaphylaxis. 1 each 0   fluorouracil (EFUDEX) 5 % cream Apply 1 application topically 2 (two) times daily.     fluticasone (FLONASE) 50 MCG/ACT nasal spray Place 2 sprays into both nostrils in the morning and at bedtime.  8   furosemide (LASIX) 20 MG tablet Take 1 tablet (20 mg total) by mouth daily. 90 tablet 1   guaiFENesin (MUCINEX) 600 MG 12 hr tablet Take 600 mg by mouth in the morning.     hydrOXYzine (ATARAX/VISTARIL) 25 MG tablet Take 50 mg by mouth at bedtime. itching     indomethacin (INDOCIN) 50 MG capsule Take 50 mg by mouth as needed (for gout flare up).     isosorbide mononitrate (IMDUR) 60 MG 24 hr tablet TAKE 1 TABLET BY MOUTH EVERY DAY 90 tablet 2   KLOR-CON M20 20 MEQ tablet TAKE 1 TABLET BY MOUTH EVERY DAY 90 tablet 3   lidocaine-prilocaine (EMLA) cream Apply 1 application topically as needed (itching).     MAGNESIUM CITRATE PO Take 250 mg by mouth at bedtime.     olmesartan (BENICAR) 40 MG tablet TAKE 1 TABLET BY MOUTH EVERY DAY. *REPLACES LOSARTAN (Patient taking differently: Take 40 mg by mouth at bedtime.) 90 tablet 2   Polyethyl Glycol-Propyl Glycol 0.4-0.3 % SOLN Place 1 drop into the left eye 2 (two) times daily. Systane     promethazine (PHENERGAN) 25 MG tablet Take 25 mg by mouth every 6 (six) hours as needed for nausea or vomiting.     sertraline (ZOLOFT) 50 MG tablet Take 50 mg by mouth at bedtime.     valACYclovir (VALTREX) 1000 MG tablet Take 1,000 mg by mouth at bedtime.     XARELTO 15 MG TABS tablet TAKE 1 TABLET BY MOUTH EVERY DAY WITH SUPPER 90 tablet 1   zolpidem (AMBIEN) 10 MG tablet Take 10 mg by mouth at bedtime.  5   No facility-administered medications prior to visit.    PAST MEDICAL HISTORY: Past Medical History:  Diagnosis Date   Allergy    Asthma    BPH (benign prostatic hypertrophy)    CKD (chronic kidney disease) stage 3, GFR 30-59 ml/min (HCC)    Cough    Gout    Hypertensive chronic kidney disease    Melanoma in  situ of right eyelid ([place])    OSA (obstructive sleep apnea)    Persistent atrial fibrillation ([place])     PAST SURGICAL HISTORY: Past Surgical History:  Procedure Laterality Date   CARDIOVERSION [name] 06/05/2015   Procedure: CARDIOVERSION;  Surgeon: Sanda Klein, MD;  Location: [facility] ENDOSCOPY;  Service: Cardiovascular;  Laterality:  [name];   CARDIOVERSION [name] 01/04/2021   Procedure: CARDIOVERSION;  Surgeon: [name], MD;  Location: [facility] ENDOSCOPY;  Service: Cardiovascular;  Laterality: [name];   CARDIOVERSION [name] 02/08/2021   Procedure: CARDIOVERSION;  Surgeon: Jerline Pain, MD;  Location: [facility] ENDOSCOPY;  Service: Cardiovascular;  Laterality: [name];   EYE SURGERY  12/2017    FAMILY HISTORY: Family History  Problem Relation Age of Onset   Hypertension Father    Lung cancer Father    Hypertension Mother     SOCIAL HISTORY: Social History   Socioeconomic History   Marital status: Married    Spouse name: [name]   Number of children: Not on file   Years of education: Not on file   Highest education level: Not on file  Occupational History   Not on file  Tobacco Use   Smoking status: Never   Smokeless tobacco: Never  Substance and Sexual Activity   Alcohol use: Yes    Alcohol/week: 1.0 standard drink    Types: 1 Standard drinks or equivalent per week    Comment: occ   Drug use: No   Sexual activity: Not on file  Other Topics Concern   Not on file  Social History Narrative   Right Handed.  Caffiene  Coffee, tea 3 cups daily.   Pt retired.     Lives at home with wife.     Social Determinants of Health   Financial Resource Strain: Not on file  Food Insecurity: Not on file  Transportation Needs: Not on file  Physical Activity: Not on file  Stress: Not on file  Social Connections: Not on file  Intimate Partner Violence: Not on file      PHYSICAL EXAM  Vitals:   03/22/21 0937  BP: 121/79  Pulse: (!) 59  Weight: 161 lb 12.8 oz (73.4 kg)  Height: 5\' 8"  (1.727 m)    Body mass index is 24.6 kg/m.  Generalized: Well developed, in no acute distress  Chest: Lungs clear to auscultation bilaterally  Neurological examination  Mentation: Alert oriented to time, place, history taking. Follows all commands speech and language fluent Cranial nerve II-XII: Extraocular movements were full, visual field were full on confrontational test Head turning and shoulder shrug  were normal and symmetric. Motor: The motor testing reveals 5 over 5 strength of all 4 extremities. Good symmetric motor tone is noted throughout.  Sensory: Sensory testing is intact to soft touch on all 4 extremities. No evidence of extinction is noted.  Gait and station: Gait is normal.    DIAGNOSTIC DATA (LABS, IMAGING, TESTING) - I reviewed patient records, labs, notes, testing and imaging myself where available.  Lab Results  Component Value Date   WBC 9.3 12/30/2020   HGB 12.2 (L) 02/08/2021   HCT 36.0 (L) 02/08/2021   MCV 86.2 12/30/2020   PLT 338 12/30/2020      Component Value Date/Time   NA 142 02/08/2021 0844   NA 144 09/19/2019 1133   K 3.9 02/08/2021 0844   CL 107 02/08/2021 0844   CO2 26 12/30/2020 1042   GLUCOSE 91 02/08/2021 0844   BUN 19 02/08/2021 0844   BUN 16 09/19/2019 1133   CREATININE 1.40 (H) 02/08/2021 0844   CREATININE 1.34 (H) 03/09/2016 1045   CALCIUM 8.7 (L) 12/30/2020 1042   PROT 6.5 12/12/2020 0844   PROT 6.7 09/19/2019 1133   ALBUMIN 3.8 12/12/2020 0844   ALBUMIN 4.1 09/19/2019 1133   AST 21 12/12/2020 0844   ALT  22 12/12/2020 0844   ALKPHOS 73 12/12/2020 0844   BILITOT 0.7 12/12/2020 0844   BILITOT 0.6 09/19/2019 1133   GFRNONAA 49 (L) 12/30/2020 1042   GFRAA 50 (L) 02/04/2020 0356     ASSESSMENT AND PLAN 87 y.o. year old male  has a past medical history of Allergy, Asthma, BPH (benign prostatic hypertrophy), CKD (chronic kidney disease) stage 3, GFR 30-59 ml/min (HCC), Cough, Gout, Hypertensive chronic kidney disease, Melanoma in situ of  right eyelid ([place]), OSA (obstructive sleep apnea), and Persistent atrial fibrillation ([place]). here with:  OSA on CPAP  - CPAP compliance excellent - Good treatment of AHI  - Encourage patient to use CPAP nightly and > 4 hours each night - Order placed for heated tubing to be turned off and SD card for the patient - F/U in 1 year or sooner if needed   Ward Givens, MSN, NP-C 03/22/2021, 9:36 AM [facility] [address], [place], [place] [address] [phone]

## 2021-03-25 ENCOUNTER — Encounter: Payer: Self-pay | Admitting: Cardiology

## 2021-03-25 ENCOUNTER — Ambulatory Visit: Payer: Medicare PPO | Admitting: Cardiology

## 2021-03-25 ENCOUNTER — Other Ambulatory Visit: Payer: Self-pay

## 2021-03-25 VITALS — BP 110/64 | HR 69 | Resp 18 | Ht 68.0 in | Wt 156.8 lb

## 2021-03-25 DIAGNOSIS — I4819 Other persistent atrial fibrillation: Secondary | ICD-10-CM | POA: Diagnosis not present

## 2021-03-25 NOTE — Progress Notes (Signed)
Electrophysiology Office Note   Date:  03/25/2021   ID:  Austin [name], DOB 03/06/1931, MRN [id number]  PCP:  Burnard Bunting, MD  Primary Electrophysiologist:  Itxel Wickard Meredith Leeds, MD    No chief complaint on file.    History of Present Illness: Austin [name] is a 87 y.o. male who presents today for electrophysiology evaluation.     He has a history of melanoma of the right eyelid with subsequent visual loss, COPD, CKD, sleep apnea.  He was diagnosed with atrial fibrillation in 2017.  He was admitted to the hospital for dofetilide load.  He converted to sinus rhythm on this medication.  He has unfortunately had more frequent episodes of atrial fibrillation.  He had a bee sting this past summer and had an EpiPen.  He went to the emergency room was noted to be in atrial fibrillation.  He has had multiple emergency room visits and cardioversions over the past few months.  His dofetilide was stopped and he was started on Multaq.  He has had corneal deposits and thus would prefer to avoid amiodarone.  Today, denies symptoms of palpitations, chest pain, shortness of breath, orthopnea, PND, lower extremity edema, claudication, dizziness, presyncope, syncope, bleeding, or neurologic sequela. The patient is tolerating medications without difficulties.  At the last visit he was started on Multaq.  He cannot tolerate the medication.  He felt quite poorly.  He also had early return of atrial fibrillation after his cardioversion.  He was taken off Multaq.  Past Medical History:  Diagnosis Date   Allergy    Asthma    BPH (benign prostatic hypertrophy)    CKD (chronic kidney disease) stage 3, GFR 30-59 ml/min (HCC)    Cough    Gout    Hypertensive chronic kidney disease    Melanoma in situ of right eyelid (HCC)    OSA (obstructive sleep apnea)    Persistent atrial fibrillation [facility])    Past Surgical History:  Procedure Laterality Date   CARDIOVERSION [name] 06/05/2015   Procedure:  CARDIOVERSION;  Surgeon: Sanda Klein, MD;  Location: [place];  Service: Cardiovascular;  Laterality: [name];   CARDIOVERSION [name] 01/04/2021   Procedure: CARDIOVERSION;  Surgeon: [name], MD;  Location: [place];  Service: Cardiovascular;  Laterality: [name];   CARDIOVERSION [name] 02/08/2021   Procedure: CARDIOVERSION;  Surgeon: Jerline Pain, MD;  Location: [place] ENDOSCOPY;  Service: Cardiovascular;  Laterality: [name];   EYE SURGERY  12/2017     Current Outpatient Medications  Medication Sig Dispense Refill   acetaminophen (TYLENOL) 500 MG tablet Take 500 mg by mouth every 6 (six) hours as needed (pain.).     albuterol (VENTOLIN HFA) 108 (90 Base) MCG/ACT inhaler Inhale 2 puffs into the lungs every 4 (four) hours as needed for wheezing or shortness of breath (cough).     amLODipine (NORVASC) 2.5 MG tablet TAKE 1 TABLET BY MOUTH EVERY DAY (Patient taking differently: Take 2.5 mg by mouth daily with lunch.) 90 tablet 1   budesonide-formoterol (SYMBICORT) 80-4.5 MCG/ACT inhaler Inhale 2 puffs into the lungs 2 (two) times daily.      Butalbital-APAP-Caff-Cod 50-300-40-30 MG CAPS Take 1 capsule by mouth every 4 (four) hours as needed (migraines).     carvedilol (COREG) 12.5 MG tablet Take 1 tablet (12.5 mg total) by mouth 2 (two) times daily.     clobetasol cream (TEMOVATE) 1.44 % Apply 1 application topically 2 (two) times daily as needed (for itching).  DM-APAP-CPM (CORICIDIN HBP) 10-325-2 MG TABS Take 1 tablet by mouth daily.     EPINEPHrine 0.3 mg/0.3 mL IJ SOAJ injection Inject 0.3 mg into the muscle as needed for anaphylaxis. 1 each 0   fluorouracil (EFUDEX) 5 % cream Apply 1 application topically 2 (two) times daily.     fluticasone (FLONASE) 50 MCG/ACT nasal spray Place 2 sprays into both nostrils in the morning and at bedtime.  8   furosemide (LASIX) 20 MG tablet Take 1 tablet (20 mg total) by mouth daily. 90 tablet 1   guaiFENesin (MUCINEX) 600 MG 12 hr tablet Take 600 mg by  mouth in the morning.     hydrOXYzine (ATARAX/VISTARIL) 25 MG tablet Take 50 mg by mouth at bedtime. itching     indomethacin (INDOCIN) 50 MG capsule Take 50 mg by mouth as needed (for gout flare up).     isosorbide mononitrate (IMDUR) 60 MG 24 hr tablet TAKE 1 TABLET BY MOUTH EVERY DAY 90 tablet 2   KLOR-CON M20 20 MEQ tablet TAKE 1 TABLET BY MOUTH EVERY DAY 90 tablet 3   lidocaine-prilocaine (EMLA) cream Apply 1 application topically as needed (itching).     MAGNESIUM CITRATE PO Take 250 mg by mouth at bedtime.     olmesartan (BENICAR) 40 MG tablet TAKE 1 TABLET BY MOUTH EVERY DAY. *REPLACES LOSARTAN (Patient taking differently: Take 40 mg by mouth at bedtime.) 90 tablet 2   Polyethyl Glycol-Propyl Glycol 0.4-0.3 % SOLN Place 1 drop into the left eye 2 (two) times daily. Systane     promethazine (PHENERGAN) 25 MG tablet Take 25 mg by mouth every 6 (six) hours as needed for nausea or vomiting.     sertraline (ZOLOFT) 50 MG tablet Take 50 mg by mouth at bedtime.     valACYclovir (VALTREX) 1000 MG tablet Take 1,000 mg by mouth at bedtime.     XARELTO 15 MG TABS tablet TAKE 1 TABLET BY MOUTH EVERY DAY WITH SUPPER 90 tablet 1   zolpidem (AMBIEN) 10 MG tablet Take 10 mg by mouth at bedtime.  5   No current facility-administered medications for this visit.    Allergies:   Bee venom, Other, Diatrizoate, Diltiazem, Yohimbine, Codeine, Ivp dye [iodinated diagnostic agents], Penicillins, and Tape   Social History:  The patient  reports that he has never smoked. He has never used smokeless tobacco. He reports current alcohol use of about 1.0 standard drink per week. He reports that he does not use drugs.   Family History:  The patient's family history includes Hypertension in his father and mother; Lung cancer in his father.   ROS:  Please see the history of present illness.   Otherwise, review of systems is positive for none.   All other systems are reviewed and negative.   PHYSICAL EXAM: VS:  BP  110/64   Pulse 69   Resp 18   Ht 5\' 8"  (1.727 m)   Wt 156 lb 12.8 oz (71.1 kg)   SpO2 92%   BMI 23.84 kg/m  , BMI Body mass index is 23.84 kg/m. GEN: Well nourished, well developed, in no acute distress  HEENT: normal  Neck: no JVD, carotid bruits, or masses Cardiac: Irregular; no murmurs, rubs, or gallops,no edema  Respiratory:  clear to auscultation bilaterally, normal work of breathing GI: soft, nontender, nondistended, + BS MS: no deformity or atrophy  Skin: warm and dry Neuro:  Strength and sensation are intact Psych: euthymic mood, full affect  EKG:  EKG is not ordered today. Personal review of the ekg ordered 03/09/21 shows atrial fibrillation, rate 63  Recent Labs: 12/12/2020: ALT 22 12/30/2020: Magnesium 2.2; Platelets 338 02/08/2021: BUN 19; Creatinine, Ser 1.40; Hemoglobin 12.2; Potassium 3.9; Sodium 142    Lipid Panel  No results found for: CHOL, TRIG, HDL, CHOLHDL, VLDL, LDLCALC, LDLDIRECT   Wt Readings from Last 3 Encounters:  03/25/21 156 lb 12.8 oz (71.1 kg)  03/22/21 161 lb 12.8 oz (73.4 kg)  03/09/21 159 lb 6.4 oz (72.3 kg)      Other studies Reviewed: Additional studies/ records that were reviewed today include: TTE 10/09/19  1. Left ventricular ejection fraction, by estimation, is 60 to 65%. The  left ventricle has normal function. The left ventricle has no regional  wall motion abnormalities. There is severe left ventricular hypertrophy.  Left ventricular diastolic parameters   are consistent with Grade II diastolic dysfunction (pseudonormalization).  Elevated left atrial pressure.   2. Right ventricular systolic function is mildly reduced. The right  ventricular size is normal. There is normal pulmonary artery systolic  pressure.   3. The mitral valve is normal in structure. Trivial mitral valve  regurgitation.   4. The aortic valve was not well visualized. Aortic valve regurgitation  is mild. Mild aortic valve stenosis. Vmax 2.3 m/s, Mg 9 mmHg,  AVA 1.3  cm^2, DI 0.43   5. Aortic dilatation noted. There is mild dilatation of the aortic root  measuring 40 mm. Mild dilatation of the ascending aorta measuring 51mm   6. Left atrial size was mildly dilated.   7. Severe asymmetric hypertrophy of basal septum. Also with apical  hypertrophy with cavity obliteration during systole. Consider cardiac MRI  to evaluate for hypertrophic cardiomyopathy or amyloidosis.    ASSESSMENT AND PLAN:  1.  Persistent atrial fibrillation: Currently on Multaq 400 mg twice daily, Xarelto 15 mg daily.  CHA2DS2-VASc of 3.  He was having more frequent episodes of atrial fibrillation on dofetilide since been switched to Multaq.  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  He likely back to normal rhythm but he is nervous about taking his medications due to his other eye issues.  He Ramiah Helfrich discuss this with his ophthalmologist and let us know whether or not it is reasonable to start the medication.  2.  Hypertension: Currently well controlled   Current medicines are reviewed at length with the patient today.   The patient does not have concerns regarding his medicines.  The following changes were made today: None  Labs/ tests ordered today include:  No orders of the defined types were placed in this encounter.    Disposition:   FU with Natasha Burda 6 months  Signed, Melissa Pulido Meredith Leeds, MD  03/25/2021 4:22 PM     [place] [address] [phone] (office) [phone] (fax)

## 2021-03-25 NOTE — H&P (View-Only) (Signed)
Electrophysiology Office Note   Date:  03/25/2021   ID:  Austin [name], DOB [birth year], MRN [id number]  PCP:  Burnard Bunting, MD  Primary Electrophysiologist:  Timber Lucarelli Meredith Leeds, MD    No chief complaint on file.    History of Present Illness: Austin [name] is a 87 y.o. male who presents today for electrophysiology evaluation.     He has a history of melanoma of the right eyelid with subsequent visual loss, COPD, CKD, sleep apnea.  He was diagnosed with atrial fibrillation in 2017.  He was admitted to the hospital for dofetilide load.  He converted to sinus rhythm on this medication.  He has unfortunately had more frequent episodes of atrial fibrillation.  He had a bee sting this past summer and had an EpiPen.  He went to the emergency room was noted to be in atrial fibrillation.  He has had multiple emergency room visits and cardioversions over the past few months.  His dofetilide was stopped and he was started on Multaq.  He has had corneal deposits and thus would prefer to avoid amiodarone.  Today, denies symptoms of palpitations, chest pain, shortness of breath, orthopnea, PND, lower extremity edema, claudication, dizziness, presyncope, syncope, bleeding, or neurologic sequela. The patient is tolerating medications without difficulties.  At the last visit he was started on Multaq.  He cannot tolerate the medication.  He felt quite poorly.  He also had early return of atrial fibrillation after his cardioversion.  He was taken off Multaq.  Past Medical History:  Diagnosis Date   Allergy    Asthma    BPH (benign prostatic hypertrophy)    CKD (chronic kidney disease) stage 3, GFR 30-59 ml/min (HCC)    Cough    Gout    Hypertensive chronic kidney disease    Melanoma in situ of right eyelid (HCC)    OSA (obstructive sleep apnea)    Persistent atrial fibrillation [facility])    Past Surgical History:  Procedure Laterality Date   CARDIOVERSION [name] 06/05/2015   Procedure:  CARDIOVERSION;  Surgeon: Sanda Klein, MD;  Location: [place];  Service: Cardiovascular;  Laterality: [name];   CARDIOVERSION [name] 01/04/2021   Procedure: CARDIOVERSION;  Surgeon: [name], MD;  Location: [place];  Service: Cardiovascular;  Laterality: [name];   CARDIOVERSION [name] 02/08/2021   Procedure: CARDIOVERSION;  Surgeon: Jerline Pain, MD;  Location: [place] [facility];  Service: Cardiovascular;  Laterality: [name];   EYE SURGERY  12/2017     Current Outpatient Medications  Medication Sig Dispense Refill   acetaminophen (TYLENOL) 500 MG tablet Take 500 mg by mouth every 6 (six) hours as needed (pain.).     albuterol (VENTOLIN HFA) 108 (90 Base) MCG/ACT inhaler Inhale 2 puffs into the lungs every 4 (four) hours as needed for wheezing or shortness of breath (cough).     amLODipine (NORVASC) 2.5 MG tablet TAKE 1 TABLET BY MOUTH EVERY DAY (Patient taking differently: Take 2.5 mg by mouth daily with lunch.) 90 tablet 1   budesonide-formoterol (SYMBICORT) 80-4.5 MCG/ACT inhaler Inhale 2 puffs into the lungs 2 (two) times daily.      Butalbital-APAP-Caff-Cod 50-300-40-30 MG CAPS Take 1 capsule by mouth every 4 (four) hours as needed (migraines).     carvedilol (COREG) 12.5 MG tablet Take 1 tablet (12.5 mg total) by mouth 2 (two) times daily.     clobetasol cream (TEMOVATE) 7.61 % Apply 1 application topically 2 (two) times daily as needed (for itching).  DM-APAP-CPM (CORICIDIN HBP) 10-325-2 MG TABS Take 1 tablet by mouth daily.     EPINEPHrine 0.3 mg/0.3 mL IJ SOAJ injection Inject 0.3 mg into the muscle as needed for anaphylaxis. 1 each 0   fluorouracil (EFUDEX) 5 % cream Apply 1 application topically 2 (two) times daily.     fluticasone (FLONASE) 50 MCG/ACT nasal spray Place 2 sprays into both nostrils in the morning and at bedtime.  8   furosemide (LASIX) 20 MG tablet Take 1 tablet (20 mg total) by mouth daily. 90 tablet 1   guaiFENesin (MUCINEX) 600 MG 12 hr tablet Take 600 mg by  mouth in the morning.     hydrOXYzine (ATARAX/VISTARIL) 25 MG tablet Take 50 mg by mouth at bedtime. itching     indomethacin (INDOCIN) 50 MG capsule Take 50 mg by mouth as needed (for gout flare up).     isosorbide mononitrate (IMDUR) 60 MG 24 hr tablet TAKE 1 TABLET BY MOUTH EVERY DAY 90 tablet 2   KLOR-CON M20 20 MEQ tablet TAKE 1 TABLET BY MOUTH EVERY DAY 90 tablet 3   lidocaine-prilocaine (EMLA) cream Apply 1 application topically as needed (itching).     MAGNESIUM CITRATE PO Take 250 mg by mouth at bedtime.     olmesartan (BENICAR) 40 MG tablet TAKE 1 TABLET BY MOUTH EVERY DAY. *REPLACES LOSARTAN (Patient taking differently: Take 40 mg by mouth at bedtime.) 90 tablet 2   Polyethyl Glycol-Propyl Glycol 0.4-0.3 % SOLN Place 1 drop into the left eye 2 (two) times daily. Systane     promethazine (PHENERGAN) 25 MG tablet Take 25 mg by mouth every 6 (six) hours as needed for nausea or vomiting.     sertraline (ZOLOFT) 50 MG tablet Take 50 mg by mouth at bedtime.     valACYclovir (VALTREX) 1000 MG tablet Take 1,000 mg by mouth at bedtime.     XARELTO 15 MG TABS tablet TAKE 1 TABLET BY MOUTH EVERY DAY WITH SUPPER 90 tablet 1   zolpidem (AMBIEN) 10 MG tablet Take 10 mg by mouth at bedtime.  5   No current facility-administered medications for this visit.    Allergies:   Bee venom, Other, Diatrizoate, Diltiazem, Yohimbine, Codeine, Ivp dye [iodinated diagnostic agents], Penicillins, and Tape   Social History:  The patient  reports that he has never smoked. He has never used smokeless tobacco. He reports current alcohol use of about 1.0 standard drink per week. He reports that he does not use drugs.   Family History:  The patient's family history includes Hypertension in his father and mother; Lung cancer in his father.   ROS:  Please see the history of present illness.   Otherwise, review of systems is positive for none.   All other systems are reviewed and negative.   PHYSICAL EXAM: VS:  BP  110/64   Pulse 69   Resp 18   Ht 5\' 8"  (1.727 m)   Wt 156 lb 12.8 oz (71.1 kg)   SpO2 92%   BMI 23.84 kg/m  , BMI Body mass index is 23.84 kg/m. GEN: Well nourished, well developed, in no acute distress  HEENT: normal  Neck: no JVD, carotid bruits, or masses Cardiac: Irregular; no murmurs, rubs, or gallops,no edema  Respiratory:  clear to auscultation bilaterally, normal work of breathing GI: soft, nontender, nondistended, + BS MS: no deformity or atrophy  Skin: warm and dry Neuro:  Strength and sensation are intact Psych: euthymic mood, full affect  EKG:  EKG is not ordered today. Personal review of the ekg ordered 03/09/21 shows atrial fibrillation, rate 63  Recent Labs: 12/12/2020: ALT 22 12/30/2020: Magnesium 2.2; Platelets 338 02/08/2021: BUN 19; Creatinine, Ser 1.40; Hemoglobin 12.2; Potassium 3.9; Sodium 142    Lipid Panel  No results found for: CHOL, TRIG, HDL, CHOLHDL, VLDL, LDLCALC, LDLDIRECT   Wt Readings from Last 3 Encounters:  03/25/21 156 lb 12.8 oz (71.1 kg)  03/22/21 161 lb 12.8 oz (73.4 kg)  03/09/21 159 lb 6.4 oz (72.3 kg)      Other studies Reviewed: Additional studies/ records that were reviewed today include: TTE 10/09/19  1. Left ventricular ejection fraction, by estimation, is 60 to 65%. The  left ventricle has normal function. The left ventricle has no regional  wall motion abnormalities. There is severe left ventricular hypertrophy.  Left ventricular diastolic parameters   are consistent with Grade II diastolic dysfunction (pseudonormalization).  Elevated left atrial pressure.   2. Right ventricular systolic function is mildly reduced. The right  ventricular size is normal. There is normal pulmonary artery systolic  pressure.   3. The mitral valve is normal in structure. Trivial mitral valve  regurgitation.   4. The aortic valve was not well visualized. Aortic valve regurgitation  is mild. Mild aortic valve stenosis. Vmax 2.3 m/s, Mg 9 mmHg,  AVA 1.3  cm^2, DI 0.43   5. Aortic dilatation noted. There is mild dilatation of the aortic root  measuring 40 mm. Mild dilatation of the ascending aorta measuring 26mm   6. Left atrial size was mildly dilated.   7. Severe asymmetric hypertrophy of basal septum. Also with apical  hypertrophy with cavity obliteration during systole. Consider cardiac MRI  to evaluate for hypertrophic cardiomyopathy or amyloidosis.    ASSESSMENT AND PLAN:  1.  Persistent atrial fibrillation: Currently on Multaq 400 mg twice daily, Xarelto 15 mg daily.  CHA2DS2-VASc of 3.  He was having more frequent episodes of atrial fibrillation on dofetilide since been switched to Multaq.  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  He likely back to normal rhythm but he is nervous about taking his medications due to his other eye issues.  He Rutilio Yellowhair discuss this with his ophthalmologist and let us know whether or not it is reasonable to start the medication.  2.  Hypertension: Currently well controlled   Current medicines are reviewed at length with the patient today.   The patient does not have concerns regarding his medicines.  The following changes were made today: None  Labs/ tests ordered today include:  No orders of the defined types were placed in this encounter.    Disposition:   FU with Marquesha Robideau 6 months  Signed, Emmanuela Ghazi Meredith Leeds, MD  03/25/2021 4:22 PM     [address] [place] [address] [phone] (office) [phone] (fax)

## 2021-03-29 ENCOUNTER — Telehealth: Payer: Self-pay | Admitting: Cardiology

## 2021-03-29 DIAGNOSIS — I4819 Other persistent atrial fibrillation: Secondary | ICD-10-CM

## 2021-03-29 DIAGNOSIS — Z01812 Encounter for preprocedural laboratory examination: Secondary | ICD-10-CM

## 2021-03-29 NOTE — Telephone Encounter (Signed)
  Pt's wife would like to speak with Sherri to schedule pt's cardioversion

## 2021-03-29 NOTE — Telephone Encounter (Signed)
Spoke to wife a little after 4pm. Agreeable to 11/30 - 12/2. Aware I will scheduled and get back with her by tomorrow. Wife is agreeable to plan.

## 2021-03-29 NOTE — Telephone Encounter (Signed)
Aiming for 11/30 DCCV. Aware I will look into this date and let her know. She informs me that we will need to send in Amiodarone Rx.

## 2021-03-30 MED ORDER — AMIODARONE HCL 200 MG PO TABS: 200.0000 mg | ORAL_TABLET | Freq: Every day | ORAL | 3 refills | Status: DC

## 2021-04-02 ENCOUNTER — Encounter: Payer: Self-pay | Admitting: *Deleted

## 2021-04-02 NOTE — Telephone Encounter (Signed)
Reviewed DCCV instructions with wife. Aware I will send via mychart. Pt will stop by the office Tuesday, 11/22, for blood work. Wife verbalized understanding and agreeable to plan.

## 2021-04-05 ENCOUNTER — Other Ambulatory Visit: Payer: Self-pay

## 2021-04-05 ENCOUNTER — Other Ambulatory Visit: Payer: Medicare PPO | Admitting: *Deleted

## 2021-04-05 ENCOUNTER — Encounter (HOSPITAL_COMMUNITY): Payer: Self-pay | Admitting: Cardiology

## 2021-04-05 DIAGNOSIS — I4819 Other persistent atrial fibrillation: Secondary | ICD-10-CM

## 2021-04-05 DIAGNOSIS — Z01812 Encounter for preprocedural laboratory examination: Secondary | ICD-10-CM

## 2021-04-05 LAB — CBC
Hematocrit: 31 % — ABNORMAL LOW (ref 37.5–51.0)
Hemoglobin: 10 g/dL — ABNORMAL LOW (ref 13.0–17.7)
MCH: 24.9 pg — ABNORMAL LOW (ref 26.6–33.0)
MCHC: 32.3 g/dL (ref 31.5–35.7)
MCV: 77 fL — ABNORMAL LOW (ref 79–97)
Platelets: 302 10*3/uL (ref 150–450)
RBC: 4.02 x10E6/uL — ABNORMAL LOW (ref 4.14–5.80)
RDW: 17.2 % — ABNORMAL HIGH (ref 11.6–15.4)
WBC: 10.4 10*3/uL (ref 3.4–10.8)

## 2021-04-05 LAB — BASIC METABOLIC PANEL
BUN/Creatinine Ratio: 13 (ref 10–24)
BUN: 18 mg/dL (ref 8–27)
CO2: 29 mmol/L (ref 20–29)
Calcium: 9.3 mg/dL (ref 8.6–10.2)
Chloride: 105 mmol/L (ref 96–106)
Creatinine, Ser: 1.4 mg/dL — ABNORMAL HIGH (ref 0.76–1.27)
Glucose: 74 mg/dL (ref 70–99)
Potassium: 4.7 mmol/L (ref 3.5–5.2)
Sodium: 142 mmol/L (ref 134–144)
eGFR: 48 mL/min/{1.73_m2} — ABNORMAL LOW (ref >59)

## 2021-04-06 ENCOUNTER — Other Ambulatory Visit: Payer: Medicare PPO

## 2021-04-12 DIAGNOSIS — R5383 Other fatigue: Secondary | ICD-10-CM | POA: Diagnosis not present

## 2021-04-12 DIAGNOSIS — J309 Allergic rhinitis, unspecified: Secondary | ICD-10-CM | POA: Diagnosis not present

## 2021-04-12 DIAGNOSIS — J449 Chronic obstructive pulmonary disease, unspecified: Secondary | ICD-10-CM | POA: Diagnosis not present

## 2021-04-12 DIAGNOSIS — R0981 Nasal congestion: Secondary | ICD-10-CM | POA: Diagnosis not present

## 2021-04-12 DIAGNOSIS — R5081 Fever presenting with conditions classified elsewhere: Secondary | ICD-10-CM | POA: Diagnosis not present

## 2021-04-12 DIAGNOSIS — R051 Acute cough: Secondary | ICD-10-CM | POA: Diagnosis not present

## 2021-04-12 DIAGNOSIS — Z1152 Encounter for screening for COVID-19: Secondary | ICD-10-CM | POA: Diagnosis not present

## 2021-04-14 ENCOUNTER — Ambulatory Visit (HOSPITAL_COMMUNITY): Payer: Medicare PPO | Admitting: Certified Registered"

## 2021-04-14 ENCOUNTER — Encounter (HOSPITAL_COMMUNITY): Payer: Self-pay | Admitting: Cardiology

## 2021-04-14 ENCOUNTER — Ambulatory Visit (HOSPITAL_COMMUNITY)
Admission: RE | Admit: 2021-04-14 | Discharge: 2021-04-14 | Disposition: A | Discharge status: Home / Self Care | Payer: Medicare PPO | Attending: Cardiology | Admitting: Cardiology

## 2021-04-14 ENCOUNTER — Encounter (HOSPITAL_COMMUNITY)
Admission: RE | Disposition: A | Discharge status: Home / Self Care | Payer: Self-pay | Source: Home / Self Care | Attending: Cardiology

## 2021-04-14 ENCOUNTER — Other Ambulatory Visit: Payer: Self-pay

## 2021-04-14 DIAGNOSIS — G4730 Sleep apnea, unspecified: Secondary | ICD-10-CM | POA: Insufficient documentation

## 2021-04-14 DIAGNOSIS — J449 Chronic obstructive pulmonary disease, unspecified: Secondary | ICD-10-CM | POA: Diagnosis not present

## 2021-04-14 DIAGNOSIS — Z7901 Long term (current) use of anticoagulants: Secondary | ICD-10-CM | POA: Diagnosis not present

## 2021-04-14 DIAGNOSIS — I10 Essential (primary) hypertension: Secondary | ICD-10-CM | POA: Diagnosis not present

## 2021-04-14 DIAGNOSIS — Z9989 Dependence on other enabling machines and devices: Secondary | ICD-10-CM | POA: Diagnosis not present

## 2021-04-14 DIAGNOSIS — I440 Atrioventricular block, first degree: Secondary | ICD-10-CM | POA: Diagnosis not present

## 2021-04-14 DIAGNOSIS — E785 Hyperlipidemia, unspecified: Secondary | ICD-10-CM | POA: Insufficient documentation

## 2021-04-14 DIAGNOSIS — I4819 Other persistent atrial fibrillation: Secondary | ICD-10-CM

## 2021-04-14 DIAGNOSIS — I491 Atrial premature depolarization: Secondary | ICD-10-CM | POA: Diagnosis not present

## 2021-04-14 DIAGNOSIS — G4733 Obstructive sleep apnea (adult) (pediatric): Secondary | ICD-10-CM | POA: Diagnosis not present

## 2021-04-14 DIAGNOSIS — J441 Chronic obstructive pulmonary disease with (acute) exacerbation: Secondary | ICD-10-CM | POA: Diagnosis not present

## 2021-04-14 DIAGNOSIS — I4892 Unspecified atrial flutter: Secondary | ICD-10-CM | POA: Diagnosis not present

## 2021-04-14 HISTORY — PX: CARDIOVERSION: SHX1299

## 2021-04-14 SURGERY — CARDIOVERSION
Anesthesia: General

## 2021-04-14 MED ORDER — PROPOFOL 10 MG/ML IV BOLUS
INTRAVENOUS | Status: DC | PRN
  Administered 2021-04-14: 10 mg via INTRAVENOUS
  Administered 2021-04-14: 20 mg via INTRAVENOUS
  Administered 2021-04-14: 30 mg via INTRAVENOUS

## 2021-04-14 MED ORDER — SODIUM CHLORIDE 0.9 % IV SOLN: INTRAVENOUS | Status: DC

## 2021-04-14 MED ORDER — LIDOCAINE 2% (20 MG/ML) 5 ML SYRINGE
INTRAMUSCULAR | Status: DC | PRN
  Administered 2021-04-14: 50 mg via INTRAVENOUS

## 2021-04-14 NOTE — Anesthesia Preprocedure Evaluation (Signed)
Anesthesia Evaluation  Patient identified by MRN, date of birth, ID band Patient awake    Reviewed: Allergy & Precautions, NPO status , Patient's Chart, lab work & pertinent test results, reviewed documented beta blocker date and time   Airway Mallampati: II       Dental  (+) Teeth Intact, Caps, Dental Advisory Given   Pulmonary asthma , sleep apnea and Continuous Positive Airway Pressure Ventilation , COPD,  COPD inhaler,    Pulmonary exam normal breath sounds clear to auscultation       Cardiovascular hypertension, Pt. on medications and Pt. on home beta blockers + dysrhythmias Atrial Fibrillation  Rhythm:Irregular Rate:Normal     Neuro/Psych negative neurological ROS  negative psych ROS   GI/Hepatic negative GI ROS, Neg liver ROS,   Endo/Other  Hyperlipidemia Gout  Renal/GU Renal InsufficiencyRenal disease  negative genitourinary   Musculoskeletal   Abdominal Normal abdominal exam  (+)   Peds  Hematology  (+) anemia , Xarelto therapy- last dose yesterday   Anesthesia Other Findings   Reproductive/Obstetrics                             Anesthesia Physical  Anesthesia Plan  ASA: 3  Anesthesia Plan: General   Post-op Pain Management:    Induction: Intravenous  PONV Risk Score and Plan: 3 and Treatment may vary due to age or medical condition and Propofol infusion  Airway Management Planned: Natural Airway and Simple Face Mask  Additional Equipment: None  Intra-op Plan:   Post-operative Plan:   Informed Consent: I have reviewed the patients History and Physical, chart, labs and discussed the procedure including the risks, benefits and alternatives for the proposed anesthesia with the patient or authorized representative who has indicated his/her understanding and acceptance.     Dental advisory given  Plan Discussed with: CRNA  Anesthesia Plan Comments:          Anesthesia Quick Evaluation

## 2021-04-14 NOTE — Discharge Instructions (Signed)

## 2021-04-14 NOTE — Anesthesia Postprocedure Evaluation (Signed)
Anesthesia Post Note  Patient: Austin [name]  Procedure(s) Performed: CARDIOVERSION     Patient location during evaluation: Endoscopy Anesthesia Type: General Level of consciousness: awake Pain management: pain level controlled Vital Signs Assessment: post-procedure vital signs reviewed and stable Respiratory status: spontaneous breathing Postop Assessment: no apparent nausea or vomiting Anesthetic complications: no   No notable events documented.  Last Vitals:  Vitals:   04/14/21 0914 04/14/21 0959  BP: (!) 178/120   Pulse: 88   Resp: (!) 23   Temp: 36.9 C (P) 36.8 C  SpO2: 98%     Last Pain:  Vitals:   04/14/21 0959  TempSrc: (P) Temporal  PainSc: (P) 0-No pain                 Huston Foley

## 2021-04-14 NOTE — Interval H&P Note (Signed)
History and Physical Interval Note:  04/14/2021 9:35 AM  [name]  has presented today for surgery, with the diagnosis of A-FIB.  The various methods of treatment have been discussed with the patient and family. After consideration of risks, benefits and other options for treatment, the patient has consented to  Procedure(s): CARDIOVERSION ([name]) as a surgical intervention.  The patient's history has been reviewed, patient examined, no change in status, stable for surgery.  I have reviewed the patient's chart and labs.  Questions were answered to the patient's satisfaction.     Freada Bergeron

## 2021-04-14 NOTE — Anesthesia Procedure Notes (Signed)
Procedure Name: General with mask airway Date/Time: 04/14/2021 9:54 AM Performed by: Imagene Riches, CRNA Pre-anesthesia Checklist: Patient identified, Emergency Drugs available, Suction available and Patient being monitored Patient Re-evaluated:Patient Re-evaluated prior to induction

## 2021-04-14 NOTE — Procedures (Signed)
Procedure: Electrical Cardioversion Indications:  Atrial Fibrillation  Procedure Details:  Consent: Risks of procedure as well as the alternatives and risks of each were explained to the (patient/caregiver).  Consent for procedure obtained.  Time Out: Verified patient identification, verified procedure, site/side was marked, verified correct patient position, special equipment/implants available, medications/allergies/relevent history reviewed, required imaging and test results available. PERFORMED.  Patient placed on cardiac monitor, pulse oximetry, supplemental oxygen as necessary.  Sedation given:  Propofol 60mg ; Lidocaine 40mg  Pacer pads placed anterior and posterior chest.  Cardioverted 2 time(s).  Cardioversion with synchronized biphasic 150J; 200J shock.  Evaluation: Findings: Post procedure EKG shows: NSR Complications: None Patient did tolerate procedure well.  Time Spent Directly with the Patient:  35minutes   Freada Bergeron 04/14/2021, 9:55 AM

## 2021-04-14 NOTE — Transfer of Care (Signed)
Immediate Anesthesia Transfer of Care Note  Patient: Austin [name]  Procedure(s) Performed: CARDIOVERSION  Patient Location: Endoscopy Unit  Anesthesia Type:General  Level of Consciousness: drowsy  Airway & Oxygen Therapy: Patient Spontanous Breathing  Post-op Assessment: Report given to RN and Post -op Vital signs reviewed and stable  Post vital signs: Reviewed and stable  Last Vitals:  Vitals Value Taken Time  BP    Temp    Pulse    Resp    SpO2      Last Pain:  Vitals:   04/14/21 0914  TempSrc: Oral  PainSc: 0-No pain         Complications: No notable events documented.

## 2021-04-15 ENCOUNTER — Other Ambulatory Visit: Payer: Self-pay | Admitting: Nurse Practitioner

## 2021-04-16 ENCOUNTER — Encounter (HOSPITAL_COMMUNITY): Payer: Self-pay | Admitting: Cardiology

## 2021-05-18 ENCOUNTER — Encounter: Payer: Self-pay | Admitting: Cardiology

## 2021-05-24 ENCOUNTER — Encounter: Payer: Self-pay | Admitting: Cardiology

## 2021-05-28 ENCOUNTER — Other Ambulatory Visit: Payer: Self-pay | Admitting: Nurse Practitioner

## 2021-05-29 ENCOUNTER — Other Ambulatory Visit: Payer: Self-pay | Admitting: Cardiology

## 2021-06-07 DIAGNOSIS — C6901 Malignant neoplasm of right conjunctiva: Secondary | ICD-10-CM | POA: Diagnosis not present

## 2021-06-12 ENCOUNTER — Other Ambulatory Visit: Payer: Self-pay | Admitting: Nurse Practitioner

## 2021-06-12 DIAGNOSIS — N183 Chronic kidney disease, stage 3 unspecified: Secondary | ICD-10-CM

## 2021-06-12 DIAGNOSIS — I10 Essential (primary) hypertension: Secondary | ICD-10-CM

## 2021-06-13 DIAGNOSIS — I10 Essential (primary) hypertension: Secondary | ICD-10-CM | POA: Diagnosis not present

## 2021-06-13 DIAGNOSIS — M1990 Unspecified osteoarthritis, unspecified site: Secondary | ICD-10-CM | POA: Diagnosis not present

## 2021-06-13 DIAGNOSIS — I129 Hypertensive chronic kidney disease with stage 1 through stage 4 chronic kidney disease, or unspecified chronic kidney disease: Secondary | ICD-10-CM | POA: Diagnosis not present

## 2021-06-13 DIAGNOSIS — J449 Chronic obstructive pulmonary disease, unspecified: Secondary | ICD-10-CM | POA: Diagnosis not present

## 2021-06-15 ENCOUNTER — Other Ambulatory Visit: Payer: Self-pay

## 2021-06-15 ENCOUNTER — Encounter (HOSPITAL_COMMUNITY): Payer: Self-pay | Admitting: Nurse Practitioner

## 2021-06-15 ENCOUNTER — Ambulatory Visit (HOSPITAL_COMMUNITY)
Admission: RE | Admit: 2021-06-15 | Discharge: 2021-06-15 | Disposition: A | Discharge status: Home / Self Care | Payer: Medicare PPO | Source: Ambulatory Visit | Attending: Nurse Practitioner | Admitting: Nurse Practitioner

## 2021-06-15 VITALS — BP 160/98 | HR 81 | Ht 68.0 in | Wt 162.4 lb

## 2021-06-15 DIAGNOSIS — Z85840 Personal history of malignant neoplasm of eye: Secondary | ICD-10-CM | POA: Diagnosis not present

## 2021-06-15 DIAGNOSIS — D6869 Other thrombophilia: Secondary | ICD-10-CM | POA: Diagnosis not present

## 2021-06-15 DIAGNOSIS — I4819 Other persistent atrial fibrillation: Secondary | ICD-10-CM | POA: Diagnosis not present

## 2021-06-15 DIAGNOSIS — H5461 Unqualified visual loss, right eye, normal vision left eye: Secondary | ICD-10-CM | POA: Diagnosis not present

## 2021-06-15 DIAGNOSIS — Z7901 Long term (current) use of anticoagulants: Secondary | ICD-10-CM | POA: Diagnosis not present

## 2021-06-15 DIAGNOSIS — Z91030 Bee allergy status: Secondary | ICD-10-CM | POA: Insufficient documentation

## 2021-06-15 NOTE — Progress Notes (Signed)
Patient ID: Austin [name], male   DOB: 1930/05/23, 88 y.o.   MRN: [id number]     Primary Care Physician: Burnard Bunting, MD Referring Physician: [facility] f/u EP: Dr. Reggy Eye Austin [name] is a 88 y.o. male with a h/o persistent afib that failed flecainide, amiodarone and was admitted and loaded on tikosyn 3/27 thru 3/30. Renal function and electrolytes were followed during the hospitalization. QTc had prolongation and required down-titration of  Tiksoyn with improvement and stable QTc, his EKG reviewed by Dr. Curt Bears.  He converted to sinus rhythm 08/12/15.  He was monitored until discharge on telemetry which demonstrated SR.  F/u in afib clinic today, 09/24/19.He was recently seen by [name], PA at the [facility] office and he was in rhythm. QTc was 495 ms. There was a question if Tikosyn needed to be stopped for  qt concerns and changed  to amiodarone. He is on lowest dose of tikosyn at 125 mcg bid.  In the office here today, his qtc is 455 ms and he is in rhythm. He does not want change in therapy. He did try amiodarone briefly at one time, since he only has one eye, he was concerned that amiodarone would negatively effect his vision.  He continues on xarelto 15 mg daily for a CHA2DS2VASc of 3.   F/u in afib clinic, 8/25,he continues on Tikosyn and is maintaining SR. He is pending having his rt eye removed for ongoing pain/ha's with h/o of melanoma/shingles and  infection in the eye. He was advised to hold xarelto x 3 days. Qtc is stable.   F/u in afib clinic, 04/13/20. He is here for surveillance of Tikosyn use. He is not having any afib with Tikosyn use. He is pending more eye surgery Wednesday for melanoma found inside the socket when  eye was removed late summer. He is off DOAC today and will restart Wednesday pm. Qtc is stable.   F/u in afib clinic, 08/19/20. He is reporting fatigue and shortness of breath. Saw his PCP this last week was very weak and had a systolic BP in the 07'P.  PCP felt he may have a UTI and started on antibiotics and also felt that he may be mildly dehydrated. He is staying in [place]. His HR though is 51 bpm and maybe bradycardia from BB may be contributing to symptoms. If this  does not help will need to discuss obtaining  a stress test. Last stress test was in 2017 did not show any ischemia. He reports that at the beach recently he did have shortness of breath carrying items up the stairs that was much worse that in previous years.   F/u in afib clinic, 12/30/20.pt is here for his usual tikosyn surveillance visit but is found to be out of rhythm  Ekg shows that he is out of rhythm in afib at 66 bpm. He has not felt up to par recently since stepping on a hornet's nest 7/23 with known allergy to bee stings. The wife gave him an epi pen and he proceeded to the ER. Ekg then showed afib in the 60's. He was treated with prednisone and d/c home. He then went back to the ER 8/2 with abdominal pain. The cause of this ws thought to be constipation from prednisone. EKG at that visit afib in the 60's. He then went to the beach and got very week. He was treated at [facility] with dehydration. He was not admitted and I do not see  where an EKG was performed. Cardioversion was discussed and he would like to proceed as he is still feeling weaker than usual. No missed anticoagulation. He remains on Tikosyn 125 mcg bid.  F/u in afib clinic, 01/12/22. He had a successful cardioversion 01/04/21. Today in the clinic, Unfortunately, he has returned to rate controlled atrial flutter. He did have more energy after the cardioversion x 2 days. He appears to be failing cardioversion, as he was out of rhythm for a few months before I saw him. He only has one eye and is concerned regarding  amiodarone affecting his sight. For now, I will leave everything in place but would like him to discuss further with Dr. Curt Bears. His wife thinks using the wheelbarrow at the beach caused him to get out of  rhythm.   F/u in afib clinic, 03/09/21. He saw Dr. Curt Bears after my last visit. Tikosyn was stopped and he was placed on Multaq. He had a successful cardioversion but had ERAF. He is here to discuss further options. He felt terrible on Multaq with increased shortness of breath, and since being off that drug feels much improved. He  is nicely rate controlled and will continue to go forward with rate controlled afib as his management option. He has failed multaq, flecainide, tikoyn and stopped amiodarone as he was afraid of retinal deposits affecting his vision as he only has one eye. His age limits ablation as an option.   F/u in afib clinic, 06/15/21. Since I saw pt last,  he had a conversation with his ophthalmologist, re starting amiodarone, as he wished to be in [place]. He was given  the ok to start so he was loaded on amiodarone and had a successful cardioversion. His ekg today shows rate controlled afib. He thought he was in [place]. He notices that he is only symptomatic with fast walking and stairs but his wife feels  that he has been more fatigued. He did not note a difference in how he felt right after cardioversion 04/14/22.    Today, he denies symptoms of palpitations, chest pain, shortness of breath, orthopnea, PND, lower extremity edema, dizziness, presyncope, syncope, or neurologic sequela. The patient is tolerating medications without difficulties and is otherwise without complaint today.   Past Medical History:  Diagnosis Date   Allergy    Asthma    BPH (benign prostatic hypertrophy)    CKD (chronic kidney disease) stage 3, GFR 30-59 ml/min (HCC)    Cough    Gout    Hypertensive chronic kidney disease    Melanoma in situ of right eyelid ([place])    OSA (obstructive sleep apnea)    Persistent atrial fibrillation [facility])    Past Surgical History:  Procedure Laterality Date   CARDIOVERSION [name] 06/05/2015   Procedure: CARDIOVERSION;  Surgeon: Sanda Klein, MD;  Location: [facility];  Service:  Cardiovascular;  Laterality: [name];   CARDIOVERSION [name] 01/04/2021   Procedure: CARDIOVERSION;  Surgeon: [name], MD;  Location: [facility];  Service: Cardiovascular;  Laterality: [name];   CARDIOVERSION [name] 02/08/2021   Procedure: CARDIOVERSION;  Surgeon: Jerline Pain, MD;  Location: [facility];  Service: Cardiovascular;  Laterality: [name];   CARDIOVERSION [name] 04/14/2021   Procedure: CARDIOVERSION;  Surgeon: Freada Bergeron, MD;  Location: [facility] ENDOSCOPY;  Service: Cardiovascular;  Laterality: [name];   EYE SURGERY  12/2017    Current Outpatient Medications  Medication Sig Dispense Refill   acetaminophen (TYLENOL) 500 MG tablet Take 500 mg by mouth every 6 (six)  hours as needed (pain.).     albuterol (VENTOLIN HFA) 108 (90 Base) MCG/ACT inhaler Inhale 2 puffs into the lungs every 4 (four) hours as needed for wheezing or shortness of breath (cough).     amiodarone (PACERONE) 200 MG tablet TAKE 1 TABLET BY MOUTH EVERY DAY 90 tablet 3   amLODipine (NORVASC) 2.5 MG tablet TAKE 1 TABLET BY MOUTH EVERY DAY (Patient taking differently: Take 2.5 mg by mouth daily with lunch.) 90 tablet 1   benzonatate (TESSALON) 100 MG capsule Take by mouth 3 (three) times daily as needed.     budesonide-formoterol (SYMBICORT) 80-4.5 MCG/ACT inhaler Inhale 2 puffs into the lungs 2 (two) times daily.      Butalbital-APAP-Caff-Cod 50-300-40-30 MG CAPS Take 1 capsule by mouth every 4 (four) hours as needed (migraines).     carvedilol (COREG) 12.5 MG tablet TAKE 1 TABLET BY MOUTH 2 TIMES DAILY. 180 tablet 3   clobetasol cream (TEMOVATE) 8.14 % Apply 1 application topically 2 (two) times daily as needed (for itching).     DM-APAP-CPM (CORICIDIN HBP) 10-325-2 MG TABS Take 1 tablet by mouth every 8 (eight) hours.     EPINEPHrine 0.3 mg/0.3 mL IJ SOAJ injection Inject 0.3 mg into the muscle as needed for anaphylaxis. 1 each 0   fluticasone (FLONASE) 50 MCG/ACT nasal spray Place 2 sprays into both nostrils in the  morning and at bedtime.  8   furosemide (LASIX) 20 MG tablet TAKE 1 TABLET BY MOUTH EVERY DAY 90 tablet 1   guaiFENesin (MUCINEX) 600 MG 12 hr tablet Take 600 mg by mouth in the morning.     hydrOXYzine (ATARAX/VISTARIL) 25 MG tablet Take 50 mg by mouth at bedtime. itching     indomethacin (INDOCIN) 50 MG capsule Take 50 mg by mouth as needed (for gout flare up).     isosorbide mononitrate (IMDUR) 60 MG 24 hr tablet TAKE 1 TABLET BY MOUTH EVERY DAY 90 tablet 2   KLOR-CON M20 20 MEQ tablet TAKE 1 TABLET BY MOUTH EVERY DAY 90 tablet 3   lidocaine-prilocaine (EMLA) cream Apply 1 application topically as needed (itching).     MAGNESIUM CITRATE PO Take 250 mg by mouth at bedtime.     olmesartan (BENICAR) 40 MG tablet TAKE 1 TABLET BY MOUTH EVERY DAY. *REPLACES LOSARTAN (Patient taking differently: Take 40 mg by mouth at bedtime.) 90 tablet 2   Polyethyl Glycol-Propyl Glycol 0.4-0.3 % SOLN Place 1 drop into the left eye 2 (two) times daily. Systane     promethazine (PHENERGAN) 25 MG tablet Take 25 mg by mouth every 6 (six) hours as needed for nausea or vomiting.     sertraline (ZOLOFT) 50 MG tablet Take 50 mg by mouth at bedtime.     valACYclovir (VALTREX) 1000 MG tablet Take 1,000 mg by mouth at bedtime.     XARELTO 15 MG TABS tablet TAKE 1 TABLET BY MOUTH EVERY DAY WITH SUPPER 90 tablet 1   zolpidem (AMBIEN) 10 MG tablet Take 10 mg by mouth at bedtime.  5   No current facility-administered medications for this encounter.    Allergies  Allergen Reactions   Bee Venom Anaphylaxis   Other Anaphylaxis   Diltiazem Other (See Comments)    Severe headaches 120 mg BID   Codeine Nausea And Vomiting   Diatrizoate Rash   Ivp Dye [Iodinated Contrast Media] Rash   Penicillins Swelling and Rash    Has patient had a PCN reaction causing immediate rash, facial/tongue/throat swelling,  SOB or lightheadedness with hypotension: Yes Has patient had a PCN reaction causing severe rash involving mucus membranes or  skin necrosis: No Has patient had a PCN reaction that required hospitalization No Has patient had a PCN reaction occurring within the last 10 years: No If all of the above answers are "NO", then may proceed with Cephalosporin use.   Tape Itching and Rash    Redness.  Please use "paper" tape only   Yohimbine     Unknown    Social History   Socioeconomic History   Marital status: Married    Spouse name: [name]   Number of children: Not on file   Years of education: Not on file   Highest education level: Not on file  Occupational History   Not on file  Tobacco Use   Smoking status: Never   Smokeless tobacco: Never  Vaping Use   Vaping Use: Never used  Substance and Sexual Activity   Alcohol use: Yes    Alcohol/week: 1.0 standard drink    Types: 1 Standard drinks or equivalent per week    Comment: occ   Drug use: No   Sexual activity: Not on file  Other Topics Concern   Not on file  Social History Narrative   Right Handed.  Caffiene  Coffee, tea 3 cups daily.   Pt retired.     Lives at home with wife.     Social Determinants of Health   Financial Resource Strain: Not on file  Food Insecurity: Not on file  Transportation Needs: Not on file  Physical Activity: Not on file  Stress: Not on file  Social Connections: Not on file  Intimate Partner Violence: Not on file    Family History  Problem Relation Age of Onset   Hypertension Mother    Hypertension Father    Lung cancer Father    Sleep apnea Neg Hx     ROS- All systems are reviewed and negative except as per the HPI above  Physical Exam: Vitals:   06/15/21 1056  Weight: 73.7 kg  Height: 5\' 8"  (1.727 m)     GEN- The patient is well appearing, alert and oriented x 3 today.   Head- normocephalic, atraumatic Eyes-  Sclera clear, conjunctiva pink. Blind rt eye Ears- hearing intact Oropharynx- clear Neck- supple, no JVP Lymph- no cervical lymphadenopathy Lungs- Clear to ausculation bilaterally, normal  work of breathing Heart- irregular rate and rhythm, no murmurs, rubs or gallops, PMI not laterally displaced GI- soft, NT, ND, + BS Extremities- no clubbing, cyanosis, or edema MS- no significant deformity or atrophy Skin- no rash or lesion Psych- euthymic mood, full affect Neuro- strength and sensation are intact  EKG- atrial fibrillation 81 bpm, qrs int 96 ms, qtc 478  ms   Epic records reviewed  2021-Echo- . Left ventricular ejection fraction, by estimation, is 60 to 65%. The left ventricle has normal function. The left ventricle has no regional wall motion abnormalities. There is severe left ventricular hypertrophy. Left ventricular diastolic parameters are consistent with Grade II diastolic dysfunction (pseudonormalization). Elevated left atrial pressure. 2. Right ventricular systolic function is mildly reduced. The right ventricular size is normal. There is normal pulmonary artery systolic pressure. 3. The mitral valve is normal in structure. Trivial mitral valve regurgitation. 4. The aortic valve was not well visualized. Aortic valve regurgitation is mild. Mild aortic valve stenosis. Vmax 2.3 m/s, Mg 9 mmHg, AVA 1.3 cm^2, DI 0.43 5. Aortic dilatation noted. There is mild dilatation  of the aortic root measuring 40 mm. Mild dilatation of the ascending aorta measuring 34mm 6. Left atrial size was mildly dilated. 7. Severe asymmetric hypertrophy of basal septum. Also with apical hypertrophy with cavity obliteration during systole. Consider cardiac MRI to evaluate for hypertrophic cardiomyopathy or amyloidosis.  2017-Nuclear stress EF: 55%. There was no ST segment deviation noted during stress. Patient was in atrial fibrillation throughout the study. There is a small defect of mild severity present in the basal inferolateral and mid inferolateral location. The defect is non-reversible. There is a small defect of mild severity present in the basal inferior and mid inferior location.  The defect is non-reversible.There is a small defect of mild severity present in the apex location. The defect is non-reversible. No ischemia noted. This is a low risk study. The left ventricular ejection fraction is normal (55-65%).    Assessment and Plan: 1. Persistent afib Has been maintaining SR on Tikosyn  until July 2022 when received a bee sting, EPI pen and went into afib.   He had successful cardioversion 8/22 but possibly only lasted 2 days.  With ERAF, he saw Dr. Curt Bears and Phyllis Ginger was stopped, Multaq was started and he had another successful cardioversion but EFAF He stopped Multaq ( made him feel terrible and have shortness of breath) He had a successful cardioversion after loading on amiodarone for 2 weeks in mid November, but has gone back into afib, date unknown.  He would like to try one more cardioversion before he gives up hope with amiodarone to maintain SR  CBC/cmet/ tsh    2. CHA2DS2VASc score  of 3 Continue xarelto 15 mg qd No missed doses for the last 3 weeks   F/u with afib clinic 1 week after cardioversion    Butch Penny C. Raushanah Osmundson, [facility] [address] [place],  [address] [phone]

## 2021-06-15 NOTE — H&P (View-Only) (Signed)
Patient ID: Austin [name], male   DOB: [birth year], 88 y.o.   MRN: [id number]     Primary Care Physician: Austin Bunting, MD Referring Physician: [facility] f/u EP: Dr. Reggy Eye Austin WEISSINGER is a 88 y.o. male with a h/o persistent afib that failed flecainide, amiodarone and was admitted and loaded on tikosyn 3/27 thru 3/30. Renal function and electrolytes were followed during the hospitalization. QTc had prolongation and required down-titration of  Tiksoyn with improvement and stable QTc, his EKG reviewed by Dr. Curt Bears.  He converted to sinus rhythm 08/12/15.  He was monitored until discharge on telemetry which demonstrated SR.  F/u in afib clinic today, 09/24/19.He was recently seen by Austin Kilts, PA at the [facility] office and he was in rhythm. QTc was 495 ms. There was a question if Tikosyn needed to be stopped for  qt concerns and changed  to amiodarone. He is on lowest dose of tikosyn at 125 mcg bid.  In the office here today, his qtc is 455 ms and he is in rhythm. He does not want change in therapy. He did try amiodarone briefly at one time, since he only has one eye, he was concerned that amiodarone would negatively effect his vision.  He continues on xarelto 15 mg daily for a CHA2DS2VASc of 3.   F/u in afib clinic, 8/25,he continues on Tikosyn and is maintaining SR. He is pending having his rt eye removed for ongoing pain/ha's with h/o of melanoma/shingles and  infection in the eye. He was advised to hold xarelto x 3 days. Qtc is stable.   F/u in afib clinic, 04/13/20. He is here for surveillance of Tikosyn use. He is not having any afib with Tikosyn use. He is pending more eye surgery Wednesday for melanoma found inside the socket when  eye was removed late summer. He is off DOAC today and will restart Wednesday pm. Qtc is stable.   F/u in afib clinic, 08/19/20. He is reporting fatigue and shortness of breath. Saw his PCP this last week was very weak and had a systolic BP in the 34'H.  PCP felt he may have a UTI and started on antibiotics and also felt that he may be mildly dehydrated. He is staying in [place]. His HR though is 51 bpm and maybe bradycardia from BB may be contributing to symptoms. If this  does not help will need to discuss obtaining  a stress test. Last stress test was in 2017 did not show any ischemia. He reports that at the beach recently he did have shortness of breath carrying items up the stairs that was much worse that in previous years.   F/u in afib clinic, 12/30/20.pt is here for his usual tikosyn surveillance visit but is found to be out of rhythm  Ekg shows that he is out of rhythm in afib at 66 bpm. He has not felt up to par recently since stepping on a hornet's nest 7/23 with known allergy to bee stings. The wife gave him an epi pen and he proceeded to the ER. Ekg then showed afib in the 60's. He was treated with prednisone and d/c home. He then went back to the ER 8/2 with abdominal pain. The cause of this ws thought to be constipation from prednisone. EKG at that visit afib in the 60's. He then went to the beach and got very week. He was treated at [facility] with dehydration. He was not admitted and I do not see  where an EKG was performed. Cardioversion was discussed and he would like to proceed as he is still feeling weaker than usual. No missed anticoagulation. He remains on Tikosyn 125 mcg bid.  F/u in afib clinic, 01/12/22. He had a successful cardioversion 01/04/21. Today in the clinic, Unfortunately, he has returned to rate controlled atrial flutter. He did have more energy after the cardioversion x 2 days. He appears to be failing cardioversion, as he was out of rhythm for a few months before I saw him. He only has one eye and is concerned regarding  amiodarone affecting his sight. For now, I will leave everything in place but would like him to discuss further with Dr. Curt Bears. His wife thinks using the wheelbarrow at the beach caused him to get out of  rhythm.   F/u in afib clinic, 03/09/21. He saw Dr. Curt Bears after my last visit. Tikosyn was stopped and he was placed on Multaq. He had a successful cardioversion but had ERAF. He is here to discuss further options. He felt terrible on Multaq with increased shortness of breath, and since being off that drug feels much improved. He  is nicely rate controlled and will continue to go forward with rate controlled afib as his management option. He has failed multaq, flecainide, tikoyn and stopped amiodarone as he was afraid of retinal deposits affecting his vision as he only has one eye. His age limits ablation as an option.   F/u in afib clinic, 06/15/21. Since I saw pt last,  he had a conversation with his ophthalmologist, re starting amiodarone, as he wished to be in [place]. He was given  the ok to start so he was loaded on amiodarone and had a successful cardioversion. His ekg today shows rate controlled afib. He thought he was in [place]. He notices that he is only symptomatic with fast walking and stairs but his wife feels  that he has been more fatigued. He did not note a difference in how he felt right after cardioversion 04/14/22.    Today, he denies symptoms of palpitations, chest pain, shortness of breath, orthopnea, PND, lower extremity edema, dizziness, presyncope, syncope, or neurologic sequela. The patient is tolerating medications without difficulties and is otherwise without complaint today.   Past Medical History:  Diagnosis Date   Allergy    Asthma    BPH (benign prostatic hypertrophy)    CKD (chronic kidney disease) stage 3, GFR 30-59 ml/min (HCC)    Cough    Gout    Hypertensive chronic kidney disease    Melanoma in situ of right eyelid ([place])    OSA (obstructive sleep apnea)    Persistent atrial fibrillation [facility])    Past Surgical History:  Procedure Laterality Date   CARDIOVERSION [name] 06/05/2015   Procedure: CARDIOVERSION;  Surgeon: Sanda Klein, MD;  Location: [place];  Service:  Cardiovascular;  Laterality: [name];   CARDIOVERSION [name] 01/04/2021   Procedure: CARDIOVERSION;  Surgeon: [name], MD;  Location: [place];  Service: Cardiovascular;  Laterality: [name];   CARDIOVERSION [name] 02/08/2021   Procedure: CARDIOVERSION;  Surgeon: Jerline Pain, MD;  Location: [place];  Service: Cardiovascular;  Laterality: [name];   CARDIOVERSION [name] 04/14/2021   Procedure: CARDIOVERSION;  Surgeon: Freada Bergeron, MD;  Location: [facility] ENDOSCOPY;  Service: Cardiovascular;  Laterality: [name];   EYE SURGERY  12/2017    Current Outpatient Medications  Medication Sig Dispense Refill   acetaminophen (TYLENOL) 500 MG tablet Take 500 mg by mouth every 6 (six)  hours as needed (pain.).     albuterol (VENTOLIN HFA) 108 (90 Base) MCG/ACT inhaler Inhale 2 puffs into the lungs every 4 (four) hours as needed for wheezing or shortness of breath (cough).     amiodarone (PACERONE) 200 MG tablet TAKE 1 TABLET BY MOUTH EVERY DAY 90 tablet 3   amLODipine (NORVASC) 2.5 MG tablet TAKE 1 TABLET BY MOUTH EVERY DAY (Patient taking differently: Take 2.5 mg by mouth daily with lunch.) 90 tablet 1   benzonatate (TESSALON) 100 MG capsule Take by mouth 3 (three) times daily as needed.     budesonide-formoterol (SYMBICORT) 80-4.5 MCG/ACT inhaler Inhale 2 puffs into the lungs 2 (two) times daily.      Butalbital-APAP-Caff-Cod 50-300-40-30 MG CAPS Take 1 capsule by mouth every 4 (four) hours as needed (migraines).     carvedilol (COREG) 12.5 MG tablet TAKE 1 TABLET BY MOUTH 2 TIMES DAILY. 180 tablet 3   clobetasol cream (TEMOVATE) 9.51 % Apply 1 application topically 2 (two) times daily as needed (for itching).     DM-APAP-CPM (CORICIDIN HBP) 10-325-2 MG TABS Take 1 tablet by mouth every 8 (eight) hours.     EPINEPHrine 0.3 mg/0.3 mL IJ SOAJ injection Inject 0.3 mg into the muscle as needed for anaphylaxis. 1 each 0   fluticasone (FLONASE) 50 MCG/ACT nasal spray Place 2 sprays into both nostrils in the  morning and at bedtime.  8   furosemide (LASIX) 20 MG tablet TAKE 1 TABLET BY MOUTH EVERY DAY 90 tablet 1   guaiFENesin (MUCINEX) 600 MG 12 hr tablet Take 600 mg by mouth in the morning.     hydrOXYzine (ATARAX/VISTARIL) 25 MG tablet Take 50 mg by mouth at bedtime. itching     indomethacin (INDOCIN) 50 MG capsule Take 50 mg by mouth as needed (for gout flare up).     isosorbide mononitrate (IMDUR) 60 MG 24 hr tablet TAKE 1 TABLET BY MOUTH EVERY DAY 90 tablet 2   KLOR-CON M20 20 MEQ tablet TAKE 1 TABLET BY MOUTH EVERY DAY 90 tablet 3   lidocaine-prilocaine (EMLA) cream Apply 1 application topically as needed (itching).     MAGNESIUM CITRATE PO Take 250 mg by mouth at bedtime.     olmesartan (BENICAR) 40 MG tablet TAKE 1 TABLET BY MOUTH EVERY DAY. *REPLACES LOSARTAN (Patient taking differently: Take 40 mg by mouth at bedtime.) 90 tablet 2   Polyethyl Glycol-Propyl Glycol 0.4-0.3 % SOLN Place 1 drop into the left eye 2 (two) times daily. Systane     promethazine (PHENERGAN) 25 MG tablet Take 25 mg by mouth every 6 (six) hours as needed for nausea or vomiting.     sertraline (ZOLOFT) 50 MG tablet Take 50 mg by mouth at bedtime.     valACYclovir (VALTREX) 1000 MG tablet Take 1,000 mg by mouth at bedtime.     XARELTO 15 MG TABS tablet TAKE 1 TABLET BY MOUTH EVERY DAY WITH SUPPER 90 tablet 1   zolpidem (AMBIEN) 10 MG tablet Take 10 mg by mouth at bedtime.  5   No current facility-administered medications for this encounter.    Allergies  Allergen Reactions   Bee Venom Anaphylaxis   Other Anaphylaxis   Diltiazem Other (See Comments)    Severe headaches 120 mg BID   Codeine Nausea And Vomiting   Diatrizoate Rash   Ivp Dye [Iodinated Contrast Media] Rash   Penicillins Swelling and Rash    Has patient had a PCN reaction causing immediate rash, facial/tongue/throat swelling,  SOB or lightheadedness with hypotension: Yes Has patient had a PCN reaction causing severe rash involving mucus membranes or  skin necrosis: No Has patient had a PCN reaction that required hospitalization No Has patient had a PCN reaction occurring within the last 10 years: No If all of the above answers are "NO", then may proceed with Cephalosporin use.   Tape Itching and Rash    Redness.  Please use "paper" tape only   Yohimbine     Unknown    Social History   Socioeconomic History   Marital status: Married    Spouse name: [name]   Number of children: Not on file   Years of education: Not on file   Highest education level: Not on file  Occupational History   Not on file  Tobacco Use   Smoking status: Never   Smokeless tobacco: Never  Vaping Use   Vaping Use: Never used  Substance and Sexual Activity   Alcohol use: Yes    Alcohol/week: 1.0 standard drink    Types: 1 Standard drinks or equivalent per week    Comment: occ   Drug use: No   Sexual activity: Not on file  Other Topics Concern   Not on file  Social History Narrative   Right Handed.  Caffiene  Coffee, tea 3 cups daily.   Pt retired.     Lives at home with wife.     Social Determinants of Health   Financial Resource Strain: Not on file  Food Insecurity: Not on file  Transportation Needs: Not on file  Physical Activity: Not on file  Stress: Not on file  Social Connections: Not on file  Intimate Partner Violence: Not on file    Family History  Problem Relation Age of Onset   Hypertension Mother    Hypertension Father    Lung cancer Father    Sleep apnea Neg Hx     ROS- All systems are reviewed and negative except as per the HPI above  Physical Exam: Vitals:   06/15/21 1056  Weight: 73.7 kg  Height: 5\' 8"  (1.727 m)     GEN- The patient is well appearing, alert and oriented x 3 today.   Head- normocephalic, atraumatic Eyes-  Sclera clear, conjunctiva pink. Blind rt eye Ears- hearing intact Oropharynx- clear Neck- supple, no JVP Lymph- no cervical lymphadenopathy Lungs- Clear to ausculation bilaterally, normal  work of breathing Heart- irregular rate and rhythm, no murmurs, rubs or gallops, PMI not laterally displaced GI- soft, NT, ND, + BS Extremities- no clubbing, cyanosis, or edema MS- no significant deformity or atrophy Skin- no rash or lesion Psych- euthymic mood, full affect Neuro- strength and sensation are intact  EKG- atrial fibrillation 81 bpm, qrs int 96 ms, qtc 478  ms   Epic records reviewed  2021-Echo- . Left ventricular ejection fraction, by estimation, is 60 to 65%. The left ventricle has normal function. The left ventricle has no regional wall motion abnormalities. There is severe left ventricular hypertrophy. Left ventricular diastolic parameters are consistent with Grade II diastolic dysfunction (pseudonormalization). Elevated left atrial pressure. 2. Right ventricular systolic function is mildly reduced. The right ventricular size is normal. There is normal pulmonary artery systolic pressure. 3. The mitral valve is normal in structure. Trivial mitral valve regurgitation. 4. The aortic valve was not well visualized. Aortic valve regurgitation is mild. Mild aortic valve stenosis. Vmax 2.3 m/s, Mg 9 mmHg, AVA 1.3 cm^2, DI 0.43 5. Aortic dilatation noted. There is mild dilatation  of the aortic root measuring 40 mm. Mild dilatation of the ascending aorta measuring 47mm 6. Left atrial size was mildly dilated. 7. Severe asymmetric hypertrophy of basal septum. Also with apical hypertrophy with cavity obliteration during systole. Consider cardiac MRI to evaluate for hypertrophic cardiomyopathy or amyloidosis.  2017-Nuclear stress EF: 55%. There was no ST segment deviation noted during stress. Patient was in atrial fibrillation throughout the study. There is a small defect of mild severity present in the basal inferolateral and mid inferolateral location. The defect is non-reversible. There is a small defect of mild severity present in the basal inferior and mid inferior location.  The defect is non-reversible.There is a small defect of mild severity present in the apex location. The defect is non-reversible. No ischemia noted. This is a low risk study. The left ventricular ejection fraction is normal (55-65%).    Assessment and Plan: 1. Persistent afib Has been maintaining SR on Tikosyn  until July 2022 when received a bee sting, EPI pen and went into afib.   He had successful cardioversion 8/22 but possibly only lasted 2 days.  With ERAF, he saw Dr. Curt Bears and Phyllis Ginger was stopped, Multaq was started and he had another successful cardioversion but EFAF He stopped Multaq ( made him feel terrible and have shortness of breath) He had a successful cardioversion after loading on amiodarone for 2 weeks in mid November, but has gone back into afib, date unknown.  He would like to try one more cardioversion before he gives up hope with amiodarone to maintain SR  CBC/cmet/ tsh    2. CHA2DS2VASc score  of 3 Continue xarelto 15 mg qd No missed doses for the last 3 weeks   F/u with afib clinic 1 week after cardioversion    Butch Penny C. Delrae Hagey, [facility] [address] [place], [place] [id number] [phone]

## 2021-06-15 NOTE — Patient Instructions (Signed)
Cardioversion scheduled for Monday, February 20th  -come to clinic for labs at [place] at the [facility] and go to admitting at [place] not eat or drink anything after midnight the night prior to your procedure.  - Take all your morning medication (except diabetic medications) with a sip of water prior to arrival.  - You will not be able to drive home after your procedure.  - Do NOT miss any doses of your blood thinner - if you should miss a dose please notify our office immediately.  - If you feel as if you go back into normal rhythm prior to scheduled cardioversion, please notify our office immediately. If your procedure is canceled in the cardioversion suite you will be charged a cancellation fee.

## 2021-06-19 ENCOUNTER — Other Ambulatory Visit (HOSPITAL_COMMUNITY): Payer: Self-pay | Admitting: Nurse Practitioner

## 2021-06-26 ENCOUNTER — Other Ambulatory Visit: Payer: Self-pay | Admitting: Nurse Practitioner

## 2021-07-05 ENCOUNTER — Other Ambulatory Visit: Payer: Self-pay

## 2021-07-05 ENCOUNTER — Ambulatory Visit (HOSPITAL_COMMUNITY)
Admission: RE | Admit: 2021-07-05 | Discharge: 2021-07-05 | Disposition: A | Discharge status: Home / Self Care | Payer: Medicare PPO | Attending: Internal Medicine | Admitting: Internal Medicine

## 2021-07-05 ENCOUNTER — Encounter (HOSPITAL_COMMUNITY): Payer: Self-pay | Admitting: Internal Medicine

## 2021-07-05 ENCOUNTER — Ambulatory Visit (HOSPITAL_BASED_OUTPATIENT_CLINIC_OR_DEPARTMENT_OTHER): Payer: Medicare PPO | Admitting: Anesthesiology

## 2021-07-05 ENCOUNTER — Other Ambulatory Visit (HOSPITAL_COMMUNITY): Payer: Medicare PPO | Admitting: Physician Assistant

## 2021-07-05 ENCOUNTER — Ambulatory Visit (HOSPITAL_COMMUNITY)
Admission: RE | Admit: 2021-07-05 | Discharge: 2021-07-05 | Disposition: A | Discharge status: Home / Self Care | Payer: Medicare PPO | Source: Ambulatory Visit | Attending: Physician Assistant | Admitting: Physician Assistant

## 2021-07-05 ENCOUNTER — Encounter (HOSPITAL_COMMUNITY)
Admission: RE | Disposition: A | Discharge status: Home / Self Care | Payer: Self-pay | Source: Home / Self Care | Attending: Internal Medicine

## 2021-07-05 ENCOUNTER — Ambulatory Visit (HOSPITAL_COMMUNITY): Payer: Medicare PPO | Admitting: Anesthesiology

## 2021-07-05 DIAGNOSIS — Z7901 Long term (current) use of anticoagulants: Secondary | ICD-10-CM | POA: Diagnosis not present

## 2021-07-05 DIAGNOSIS — J449 Chronic obstructive pulmonary disease, unspecified: Secondary | ICD-10-CM | POA: Diagnosis not present

## 2021-07-05 DIAGNOSIS — I4819 Other persistent atrial fibrillation: Secondary | ICD-10-CM | POA: Insufficient documentation

## 2021-07-05 DIAGNOSIS — G4730 Sleep apnea, unspecified: Secondary | ICD-10-CM | POA: Diagnosis not present

## 2021-07-05 DIAGNOSIS — G4733 Obstructive sleep apnea (adult) (pediatric): Secondary | ICD-10-CM | POA: Diagnosis not present

## 2021-07-05 DIAGNOSIS — I4891 Unspecified atrial fibrillation: Secondary | ICD-10-CM

## 2021-07-05 DIAGNOSIS — I129 Hypertensive chronic kidney disease with stage 1 through stage 4 chronic kidney disease, or unspecified chronic kidney disease: Secondary | ICD-10-CM | POA: Diagnosis not present

## 2021-07-05 DIAGNOSIS — D649 Anemia, unspecified: Secondary | ICD-10-CM | POA: Diagnosis not present

## 2021-07-05 DIAGNOSIS — N183 Chronic kidney disease, stage 3 unspecified: Secondary | ICD-10-CM | POA: Insufficient documentation

## 2021-07-05 DIAGNOSIS — I10 Essential (primary) hypertension: Secondary | ICD-10-CM

## 2021-07-05 HISTORY — PX: CARDIOVERSION: SHX1299

## 2021-07-05 LAB — CBC
HCT: 32.8 % — ABNORMAL LOW (ref 39.0–52.0)
Hemoglobin: 9.8 g/dL — ABNORMAL LOW (ref 13.0–17.0)
MCH: 24.1 pg — ABNORMAL LOW (ref 26.0–34.0)
MCHC: 29.9 g/dL — ABNORMAL LOW (ref 30.0–36.0)
MCV: 80.8 fL (ref 80.0–100.0)
Platelets: 370 10*3/uL (ref 150–400)
RBC: 4.06 MIL/uL — ABNORMAL LOW (ref 4.22–5.81)
RDW: 18.5 % — ABNORMAL HIGH (ref 11.5–15.5)
WBC: 9.7 10*3/uL (ref 4.0–10.5)
nRBC: 0 % (ref 0.0–0.2)

## 2021-07-05 LAB — COMPREHENSIVE METABOLIC PANEL
ALT: 13 U/L (ref 0–44)
AST: 18 U/L (ref 15–41)
Albumin: 3.4 g/dL — ABNORMAL LOW (ref 3.5–5.0)
Alkaline Phosphatase: 72 U/L (ref 38–126)
Anion gap: 7 (ref 5–15)
BUN: 22 mg/dL (ref 8–23)
CO2: 26 mmol/L (ref 22–32)
Calcium: 8.8 mg/dL — ABNORMAL LOW (ref 8.9–10.3)
Chloride: 107 mmol/L (ref 98–111)
Creatinine, Ser: 1.7 mg/dL — ABNORMAL HIGH (ref 0.61–1.24)
GFR, Estimated: 38 mL/min — ABNORMAL LOW (ref >60)
Glucose, Bld: 108 mg/dL — ABNORMAL HIGH (ref 70–99)
Potassium: 4.1 mmol/L (ref 3.5–5.1)
Sodium: 140 mmol/L (ref 135–145)
Total Bilirubin: 0.6 mg/dL (ref 0.3–1.2)
Total Protein: 6.2 g/dL — ABNORMAL LOW (ref 6.5–8.1)

## 2021-07-05 LAB — TSH: TSH: 2.625 u[IU]/mL (ref 0.350–4.500)

## 2021-07-05 SURGERY — CARDIOVERSION
Anesthesia: General

## 2021-07-05 MED ORDER — PROPOFOL 10 MG/ML IV BOLUS
INTRAVENOUS | Status: DC | PRN
  Administered 2021-07-05: 60 mg via INTRAVENOUS

## 2021-07-05 MED ORDER — SODIUM CHLORIDE 0.9 % IV SOLN: INTRAVENOUS | Status: DC

## 2021-07-05 MED ORDER — LIDOCAINE 2% (20 MG/ML) 5 ML SYRINGE
INTRAMUSCULAR | Status: DC | PRN
Start: 2021-07-05 — End: 2021-07-05
  Administered 2021-07-05: 60 mg via INTRAVENOUS

## 2021-07-05 NOTE — Discharge Instructions (Signed)

## 2021-07-05 NOTE — Anesthesia Preprocedure Evaluation (Addendum)
Anesthesia Evaluation  Patient identified by MRN, date of birth, ID band Patient awake    Reviewed: Allergy & Precautions, NPO status , Patient's Chart, lab work & pertinent test results  Airway Mallampati: II  TM Distance: >3 FB Neck ROM: Full    Dental no notable dental hx.    Pulmonary asthma , sleep apnea , COPD,    Pulmonary exam normal breath sounds clear to auscultation       Cardiovascular hypertension, Pt. on medications and Pt. on home beta blockers Normal cardiovascular exam+ dysrhythmias Atrial Fibrillation  Rhythm:Regular Rate:Normal     Neuro/Psych negative neurological ROS     GI/Hepatic negative GI ROS, Neg liver ROS,   Endo/Other  negative endocrine ROS  Renal/GU Renal disease     Musculoskeletal negative musculoskeletal ROS (+)   Abdominal   Peds  Hematology  (+) Blood dyscrasia, anemia ,   Anesthesia Other Findings A-FIB  Reproductive/Obstetrics                            Anesthesia Physical Anesthesia Plan  ASA: 3  Anesthesia Plan: General   Post-op Pain Management:    Induction: Intravenous  PONV Risk Score and Plan: 2 and Propofol infusion and Treatment may vary due to age or medical condition  Airway Management Planned: Nasal Cannula  Additional Equipment:   Intra-op Plan:   Post-operative Plan:   Informed Consent: I have reviewed the patients History and Physical, chart, labs and discussed the procedure including the risks, benefits and alternatives for the proposed anesthesia with the patient or authorized representative who has indicated his/her understanding and acceptance.     Dental advisory given  Plan Discussed with: CRNA  Anesthesia Plan Comments:         Anesthesia Quick Evaluation

## 2021-07-05 NOTE — CV Procedure (Signed)
CARDIOVERSION  INdication:  Atrial fibrillation  Patient sedated by anesthesia with lidocaine and Propofol intravenously  WIth pads in the AP position, pt cardioverted to SR with 200 J synchronized biphasic energy.  Procedure without complication.  12 lead EKG pending  [name] MD

## 2021-07-05 NOTE — Interval H&P Note (Signed)
History and Physical Interval Note:  07/05/2021 10:11 AM  Austin [name]  has presented today for surgery, with the diagnosis of AFIB.  The various methods of treatment have been discussed with the patient and family. After consideration of risks, benefits and other options for treatment, the patient has consented to  Procedure(s): CARDIOVERSION ([name]) as a surgical intervention.  The patient's history has been reviewed, patient examined, no change in status, stable for surgery.  I have reviewed the patient's chart and labs.  Questions were answered to the patient's satisfaction.     [name]

## 2021-07-05 NOTE — Transfer of Care (Signed)
Immediate Anesthesia Transfer of Care Note  Patient: Austin [name]  Procedure(s) Performed: CARDIOVERSION  Patient Location: Endoscopy Unit  Anesthesia Type:General  Level of Consciousness: drowsy and patient cooperative  Airway & Oxygen Therapy: Patient Spontanous Breathing and Patient connected to nasal cannula oxygen  Post-op Assessment: Report given to RN and Post -op Vital signs reviewed and stable  Post vital signs: Reviewed and stable  Last Vitals:  Vitals Value Taken Time  BP    Temp    Pulse    Resp    SpO2      Last Pain:  Vitals:   07/05/21 1000  TempSrc: Temporal         Complications: No notable events documented.

## 2021-07-06 NOTE — Anesthesia Postprocedure Evaluation (Signed)
Anesthesia Post Note  Patient: Austin [name]  Procedure(s) Performed: CARDIOVERSION     Patient location during evaluation: Endoscopy Anesthesia Type: General Level of consciousness: awake Pain management: pain level controlled Vital Signs Assessment: post-procedure vital signs reviewed and stable Respiratory status: spontaneous breathing, nonlabored ventilation, respiratory function stable and patient connected to nasal cannula oxygen Cardiovascular status: blood pressure returned to baseline and stable Postop Assessment: no apparent nausea or vomiting Anesthetic complications: no   No notable events documented.  Last Vitals:  Vitals:   07/05/21 1044 07/05/21 1055  BP: 130/70 (!) 144/73  Pulse: (!) 58 (!) 55  Resp: 14 14  Temp:    SpO2: 94% 94%    Last Pain:  Vitals:   07/05/21 1055  TempSrc:   PainSc: 0-No pain                 Shanta Dorvil P Zaela Graley

## 2021-07-14 ENCOUNTER — Ambulatory Visit (HOSPITAL_COMMUNITY)
Admission: RE | Admit: 2021-07-14 | Discharge: 2021-07-14 | Disposition: A | Discharge status: Home / Self Care | Payer: Medicare PPO | Source: Ambulatory Visit | Attending: Nurse Practitioner | Admitting: Nurse Practitioner

## 2021-07-14 ENCOUNTER — Other Ambulatory Visit: Payer: Self-pay

## 2021-07-14 VITALS — BP 134/56 | HR 48 | Ht 68.0 in | Wt 157.2 lb

## 2021-07-14 DIAGNOSIS — D6869 Other thrombophilia: Secondary | ICD-10-CM

## 2021-07-14 DIAGNOSIS — Z7901 Long term (current) use of anticoagulants: Secondary | ICD-10-CM | POA: Diagnosis not present

## 2021-07-14 DIAGNOSIS — I4819 Other persistent atrial fibrillation: Secondary | ICD-10-CM

## 2021-07-14 DIAGNOSIS — I10 Essential (primary) hypertension: Secondary | ICD-10-CM | POA: Diagnosis not present

## 2021-07-14 NOTE — Progress Notes (Signed)
Patient ID: DALLIS CZAJA, male   DOB: June 23, 1930, 88 y.o.   MRN: [id number]     Primary Care Physician: Burnard Bunting, MD Referring Physician: [facility] f/u EP: Dr. Reggy Eye JISHNU JENNIGES is a 88 y.o. male with a h/o persistent afib that failed flecainide, amiodarone and was admitted and loaded on tikosyn 3/27 thru 3/30. Renal function and electrolytes were followed during the hospitalization. QTc had prolongation and required down-titration of  Tiksoyn with improvement and stable QTc, his EKG reviewed by Dr. Curt Bears.  He converted to sinus rhythm 08/12/15.  He was monitored until discharge on telemetry which demonstrated SR.  F/u in afib clinic today, 09/24/19.He was recently seen by [name], PA at the [facility] office and he was in rhythm. QTc was 495 ms. There was a question if Tikosyn needed to be stopped for  qt concerns and changed  to amiodarone. He is on lowest dose of tikosyn at 125 mcg bid.  In the office here today, his qtc is 455 ms and he is in rhythm. He does not want change in therapy. He did try amiodarone briefly at one time, since he only has one eye, he was concerned that amiodarone would negatively effect his vision.  He continues on xarelto 15 mg daily for a CHA2DS2VASc of 3.   F/u in afib clinic, 8/25,he continues on Tikosyn and is maintaining SR. He is pending having his rt eye removed for ongoing pain/ha's with h/o of melanoma/shingles and  infection in the eye. He was advised to hold xarelto x 3 days. Qtc is stable.   F/u in afib clinic, 04/13/20. He is here for surveillance of Tikosyn use. He is not having any afib with Tikosyn use. He is pending more eye surgery Wednesday for melanoma found inside the socket when  eye was removed late summer. He is off DOAC today and will restart Wednesday pm. Qtc is stable.   F/u in afib clinic, 08/19/20. He is reporting fatigue and shortness of breath. Saw his PCP this last week was very weak and had a systolic BP in the 51'Z.  PCP felt he may have a UTI and started on antibiotics and also felt that he may be mildly dehydrated. He is staying in [place]. His HR though is 51 bpm and maybe bradycardia from BB may be contributing to symptoms. If this  does not help will need to discuss obtaining  a stress test. Last stress test was in 2017 did not show any ischemia. He reports that at the beach recently he did have shortness of breath carrying items up the stairs that was much worse that in previous years.   F/u in afib clinic, 12/30/20.pt is here for his usual tikosyn surveillance visit but is found to be out of rhythm  Ekg shows that he is out of rhythm in afib at 66 bpm. He has not felt up to par recently since stepping on a hornet's nest 7/23 with known allergy to bee stings. The wife gave him an epi pen and he proceeded to the ER. Ekg then showed afib in the 60's. He was treated with prednisone and d/c home. He then went back to the ER 8/2 with abdominal pain. The cause of this ws thought to be constipation from prednisone. EKG at that visit afib in the 60's. He then went to the beach and got very week. He was treated at [facility] with dehydration. He was not admitted and I do not see  where an EKG was performed. Cardioversion was discussed and he would like to proceed as he is still feeling weaker than usual. No missed anticoagulation. He remains on Tikosyn 125 mcg bid.  F/u in afib clinic, 01/12/22. He had a successful cardioversion 01/04/21. Today in the clinic, Unfortunately, he has returned to rate controlled atrial flutter. He did have more energy after the cardioversion x 2 days. He appears to be failing cardioversion, as he was out of rhythm for a few months before I saw him. He only has one eye and is concerned regarding  amiodarone affecting his sight. For now, I will leave everything in place but would like him to discuss further with Dr. Curt Bears. His wife thinks using the wheelbarrow at the beach caused him to get out of  rhythm.   F/u in afib clinic, 03/09/21. He saw Dr. Curt Bears after my last visit. Tikosyn was stopped and he was placed on Multaq. He had a successful cardioversion but had ERAF. He is here to discuss further options. He felt terrible on Multaq with increased shortness of breath, and since being off that drug feels much improved. He  is nicely rate controlled and will continue to go forward with rate controlled afib as his management option. He has failed multaq, flecainide, tikoyn and stopped amiodarone as he was afraid of retinal deposits affecting his vision as he only has one eye. His age limits ablation as an option.   F/u in afib clinic, 06/15/21. Since I saw pt last,  he had a conversation with his ophthalmologist, re starting amiodarone, as he wished to be in [place]. He was given  the ok to start so he was loaded on amiodarone and had a successful cardioversion. His ekg today shows rate controlled afib. He thought he was in [place]. He notices that he is only symptomatic with fast walking and stairs but his wife feels  that he has been more fatigued. He did not note a difference in how he felt right after cardioversion 04/14/22.    F/u afib clinic, s/p cardioversion, 07/14/21. He had a successful cardioversion and remains in Sinus brady. He feels he has more energy but still overall has shortness of breath, but hap[py he is back in SR.   Today, he denies symptoms of palpitations, chest pain, shortness of breath, orthopnea, PND, lower extremity edema, dizziness, presyncope, syncope, or neurologic sequela. The patient is tolerating medications without difficulties and is otherwise without complaint today.   Past Medical History:  Diagnosis Date   Allergy    Asthma    BPH (benign prostatic hypertrophy)    CKD (chronic kidney disease) stage 3, GFR 30-59 ml/min (HCC)    Cough    Gout    Hypertensive chronic kidney disease    Melanoma in situ of right eyelid ([place])    OSA (obstructive sleep apnea)     Persistent atrial fibrillation ([place])    Past Surgical History:  Procedure Laterality Date   CARDIOVERSION [name] 06/05/2015   Procedure: CARDIOVERSION;  Surgeon: Sanda Klein, MD;  Location: [place];  Service: Cardiovascular;  Laterality: [name];   CARDIOVERSION [name] 01/04/2021   Procedure: CARDIOVERSION;  Surgeon: [name], MD;  Location: [place];  Service: Cardiovascular;  Laterality: [name];   CARDIOVERSION [name] 02/08/2021   Procedure: CARDIOVERSION;  Surgeon: Jerline Pain, MD;  Location: [place];  Service: Cardiovascular;  Laterality: [name];   CARDIOVERSION [name] 04/14/2021   Procedure: CARDIOVERSION;  Surgeon: Freada Bergeron, MD;  Location: [facility] ENDOSCOPY;  Service: Cardiovascular;  Laterality: [name];   CARDIOVERSION [name] 07/05/2021   Procedure: CARDIOVERSION;  Surgeon: Fay Records, MD;  Location: [facility] ENDOSCOPY;  Service: Cardiovascular;  Laterality: [name];   EYE SURGERY  12/2017    Current Outpatient Medications  Medication Sig Dispense Refill   acetaminophen (TYLENOL) 500 MG tablet Take 500 mg by mouth every 6 (six) hours as needed (pain.).     albuterol (VENTOLIN HFA) 108 (90 Base) MCG/ACT inhaler Inhale 2 puffs into the lungs every 4 (four) hours as needed for wheezing or shortness of breath (cough).     amiodarone (PACERONE) 200 MG tablet TAKE 1 TABLET BY MOUTH EVERY DAY 90 tablet 3   amLODipine (NORVASC) 2.5 MG tablet TAKE 1 TABLET BY MOUTH EVERY DAY 90 tablet 1   benzonatate (TESSALON) 100 MG capsule Take 100 mg by mouth 3 (three) times daily as needed for cough.     budesonide-formoterol (SYMBICORT) 80-4.5 MCG/ACT inhaler Inhale 2 puffs into the lungs 2 (two) times daily.      Butalbital-APAP-Caff-Cod 50-300-40-30 MG CAPS Take 1 capsule by mouth every 4 (four) hours as needed (migraines).     carvedilol (COREG) 12.5 MG tablet TAKE 1 TABLET BY MOUTH 2 TIMES DAILY. 180 tablet 3   clobetasol cream (TEMOVATE) 6.56 % Apply 1 application topically 2 (two) times daily as  needed (for itching).     EPINEPHrine 0.3 mg/0.3 mL IJ SOAJ injection Inject 0.3 mg into the muscle as needed for anaphylaxis. 1 each 0   fluticasone (FLONASE) 50 MCG/ACT nasal spray Place 2 sprays into both nostrils in the morning and at bedtime.  8   furosemide (LASIX) 20 MG tablet TAKE 1 TABLET BY MOUTH EVERY DAY 90 tablet 1   guaiFENesin (MUCINEX) 600 MG 12 hr tablet Take 600 mg by mouth in the morning.     hydrOXYzine (ATARAX/VISTARIL) 25 MG tablet Take 50 mg by mouth at bedtime. itching     indomethacin (INDOCIN) 50 MG capsule Take 50 mg by mouth 3 (three) times daily as needed (for gout flare up).     isosorbide mononitrate (IMDUR) 60 MG 24 hr tablet TAKE 1 TABLET BY MOUTH EVERY DAY 90 tablet 2   KLOR-CON M20 20 MEQ tablet TAKE 1 TABLET BY MOUTH EVERY DAY 90 tablet 3   lidocaine-prilocaine (EMLA) cream Apply 1 application topically as needed (itching).     MAGNESIUM CITRATE PO Take 250 mg by mouth at bedtime.     olmesartan (BENICAR) 40 MG tablet TAKE 1 TABLET EVERY DAY **REPLACES LOSARTAN** 90 tablet 2   Polyethyl Glycol-Propyl Glycol 0.4-0.3 % SOLN Place 1 drop into the left eye 2 (two) times daily. Systane     promethazine (PHENERGAN) 25 MG tablet Take 25 mg by mouth every 6 (six) hours as needed for nausea or vomiting.     sertraline (ZOLOFT) 50 MG tablet Take 50 mg by mouth at bedtime.     valACYclovir (VALTREX) 1000 MG tablet Take 1,000 mg by mouth at bedtime.     XARELTO 15 MG TABS tablet TAKE 1 TABLET BY MOUTH EVERY DAY WITH SUPPER 90 tablet 1   zolpidem (AMBIEN) 10 MG tablet Take 10 mg by mouth at bedtime.  5   No current facility-administered medications for this encounter.    Allergies  Allergen Reactions   Bee Venom Anaphylaxis   Diltiazem Other (See Comments)    Severe headaches 120 mg BID   Codeine Nausea And Vomiting   Diatrizoate Rash   Ivp Dye [  Iodinated Contrast Media] Rash   Penicillins Swelling and Rash    Has patient had a PCN reaction causing immediate  rash, facial/tongue/throat swelling, SOB or lightheadedness with hypotension: Yes Has patient had a PCN reaction causing severe rash involving mucus membranes or skin necrosis: No Has patient had a PCN reaction that required hospitalization No Has patient had a PCN reaction occurring within the last 10 years: No If all of the above answers are "NO", then may proceed with Cephalosporin use.   Tape Itching and Rash    Redness.  Please use "paper" tape only   Yohimbine     Unknown    Social History   Socioeconomic History   Marital status: Married    Spouse name: [name]   Number of children: Not on file   Years of education: Not on file   Highest education level: Not on file  Occupational History   Not on file  Tobacco Use   Smoking status: Never   Smokeless tobacco: Never  Vaping Use   Vaping Use: Never used  Substance and Sexual Activity   Alcohol use: Yes    Alcohol/week: 1.0 standard drink    Types: 1 Standard drinks or equivalent per week    Comment: occ   Drug use: No   Sexual activity: Not on file  Other Topics Concern   Not on file  Social History Narrative   Right Handed.  Caffiene  Coffee, tea 3 cups daily.   Pt retired.     Lives at home with wife.     Social Determinants of Health   Financial Resource Strain: Not on file  Food Insecurity: Not on file  Transportation Needs: Not on file  Physical Activity: Not on file  Stress: Not on file  Social Connections: Not on file  Intimate Partner Violence: Not on file    Family History  Problem Relation Age of Onset   Hypertension Mother    Hypertension Father    Lung cancer Father    Sleep apnea Neg Hx     ROS- All systems are reviewed and negative except as per the HPI above  Physical Exam: Vitals:   07/14/21 1130  BP: (!) 134/56  Pulse: (!) 48  Weight: 71.3 kg  Height: 5\' 8"  (1.727 m)     GEN- The patient is well appearing, alert and oriented x 3 today.   Head- normocephalic, atraumatic Eyes-   Sclera clear, conjunctiva pink. Blind rt eye Ears- hearing intact Oropharynx- clear Neck- supple, no JVP Lymph- no cervical lymphadenopathy Lungs- Clear to ausculation bilaterally, normal work of breathing Heart- irregular rate and rhythm, no murmurs, rubs or gallops, PMI not laterally displaced GI- soft, NT, ND, + BS Extremities- no clubbing, cyanosis, or edema MS- no significant deformity or atrophy Skin- no rash or lesion Psych- euthymic mood, full affect Neuro- strength and sensation are intact  EKG-  Vent. rate 48 BPM PR interval 224 ms QRS duration 96 ms QT/QTcB 520/464 ms P-R-T axes 39 -67 -19 Sinus bradycardia with 1st degree A-V block Left axis deviation Minimal voltage criteria for LVH, may be normal variant ( Cornell product ) Nonspecific ST and T wave abnormality Prolonged QT Abnormal ECG When compared with ECG of 05-Jul-2021 10:41, PREVIOUS ECG IS PRESEN Epic records reviewed  2021-Echo- . Left ventricular ejection fraction, by estimation, is 60 to 65%. The left ventricle has normal function. The left ventricle has no regional wall motion abnormalities. There is severe left ventricular hypertrophy. Left ventricular  diastolic parameters are consistent with Grade II diastolic dysfunction (pseudonormalization). Elevated left atrial pressure. 2. Right ventricular systolic function is mildly reduced. The right ventricular size is normal. There is normal pulmonary artery systolic pressure. 3. The mitral valve is normal in structure. Trivial mitral valve regurgitation. 4. The aortic valve was not well visualized. Aortic valve regurgitation is mild. Mild aortic valve stenosis. Vmax 2.3 m/s, Mg 9 mmHg, AVA 1.3 cm^2, DI 0.43 5. Aortic dilatation noted. There is mild dilatation of the aortic root measuring 40 mm. Mild dilatation of the ascending aorta measuring 16mm 6. Left atrial size was mildly dilated. 7. Severe asymmetric hypertrophy of basal septum. Also with apical  hypertrophy with cavity obliteration during systole. Consider cardiac MRI to evaluate for hypertrophic cardiomyopathy or amyloidosis.  2017-Nuclear stress EF: 55%. There was no ST segment deviation noted during stress. Patient was in atrial fibrillation throughout the study. There is a small defect of mild severity present in the basal inferolateral and mid inferolateral location. The defect is non-reversible. There is a small defect of mild severity present in the basal inferior and mid inferior location. The defect is non-reversible.There is a small defect of mild severity present in the apex location. The defect is non-reversible. No ischemia noted. This is a low risk study. The left ventricular ejection fraction is normal (55-65%).    Assessment and Plan: 1. Persistent afib Has been maintaining SR on Tikosyn  until July 2022 when received a bee sting, EPI pen and went into afib.   He had successful cardioversion 8/22 but possibly only lasted 2 days.  With ERAF, he saw Dr. Curt Bears and Phyllis Ginger was stopped, Multaq was started and he had another successful cardioversion but EFAF He stopped Multaq ( made him feel terrible and have shortness of breath) He had a successful cardioversion after loading on amiodarone for 2 weeks in mid November, bu went  gone back into afib, date unknown.  He wanted like to try one more cardioversion before he gave  up hope with amiodarone to maintain SR  He had a successful cardioversion and is maintaining  SR today HR 49 bpm but not symptomatic with bradycardia  Continue amiodarone 200 mg daily Continue carvedilol 12.5 mg bid   2. CHA2DS2VASc score  of 3 Continue xarelto 15 mg qd  3. HTN Stable  No change    F/u with Dr. Curt Bears 10/14/21   Geroge Baseman. [facility] [address] [place], [place] [address] [place], [facility] [address] [place],  [place] [address] [phone]

## 2021-08-13 DIAGNOSIS — H353122 Nonexudative age-related macular degeneration, left eye, intermediate dry stage: Secondary | ICD-10-CM | POA: Diagnosis not present

## 2021-08-13 DIAGNOSIS — H3581 Retinal edema: Secondary | ICD-10-CM | POA: Diagnosis not present

## 2021-08-13 DIAGNOSIS — Z961 Presence of intraocular lens: Secondary | ICD-10-CM | POA: Diagnosis not present

## 2021-08-13 DIAGNOSIS — H26492 Other secondary cataract, left eye: Secondary | ICD-10-CM | POA: Diagnosis not present

## 2021-08-18 DIAGNOSIS — D692 Other nonthrombocytopenic purpura: Secondary | ICD-10-CM | POA: Diagnosis not present

## 2021-08-18 DIAGNOSIS — I129 Hypertensive chronic kidney disease with stage 1 through stage 4 chronic kidney disease, or unspecified chronic kidney disease: Secondary | ICD-10-CM | POA: Diagnosis not present

## 2021-08-18 DIAGNOSIS — Z1331 Encounter for screening for depression: Secondary | ICD-10-CM | POA: Diagnosis not present

## 2021-08-18 DIAGNOSIS — Z1339 Encounter for screening examination for other mental health and behavioral disorders: Secondary | ICD-10-CM | POA: Diagnosis not present

## 2021-08-18 DIAGNOSIS — J449 Chronic obstructive pulmonary disease, unspecified: Secondary | ICD-10-CM | POA: Diagnosis not present

## 2021-08-18 DIAGNOSIS — I4891 Unspecified atrial fibrillation: Secondary | ICD-10-CM | POA: Diagnosis not present

## 2021-08-18 DIAGNOSIS — N400 Enlarged prostate without lower urinary tract symptoms: Secondary | ICD-10-CM | POA: Diagnosis not present

## 2021-08-19 DIAGNOSIS — R519 Headache, unspecified: Secondary | ICD-10-CM | POA: Diagnosis not present

## 2021-08-19 DIAGNOSIS — S0001XA Abrasion of scalp, initial encounter: Secondary | ICD-10-CM | POA: Diagnosis not present

## 2021-08-19 DIAGNOSIS — I10 Essential (primary) hypertension: Secondary | ICD-10-CM | POA: Diagnosis not present

## 2021-08-19 DIAGNOSIS — W1830XA Fall on same level, unspecified, initial encounter: Secondary | ICD-10-CM | POA: Diagnosis not present

## 2021-08-19 DIAGNOSIS — S0990XA Unspecified injury of head, initial encounter: Secondary | ICD-10-CM | POA: Diagnosis not present

## 2021-08-19 DIAGNOSIS — Z7901 Long term (current) use of anticoagulants: Secondary | ICD-10-CM | POA: Diagnosis not present

## 2021-08-19 DIAGNOSIS — S199XXA Unspecified injury of neck, initial encounter: Secondary | ICD-10-CM | POA: Diagnosis not present

## 2021-09-01 DIAGNOSIS — H26492 Other secondary cataract, left eye: Secondary | ICD-10-CM | POA: Diagnosis not present

## 2021-09-27 ENCOUNTER — Other Ambulatory Visit: Payer: Self-pay | Admitting: Nurse Practitioner

## 2021-10-14 ENCOUNTER — Encounter: Payer: Self-pay | Admitting: Cardiology

## 2021-10-14 ENCOUNTER — Ambulatory Visit: Payer: Medicare PPO | Admitting: Cardiology

## 2021-10-14 VITALS — BP 122/72 | HR 56 | Ht 68.0 in | Wt 156.4 lb

## 2021-10-14 DIAGNOSIS — I4819 Other persistent atrial fibrillation: Secondary | ICD-10-CM | POA: Diagnosis not present

## 2021-10-14 DIAGNOSIS — D6869 Other thrombophilia: Secondary | ICD-10-CM | POA: Diagnosis not present

## 2021-10-14 MED ORDER — CARVEDILOL 6.25 MG PO TABS: 6.2500 mg | ORAL_TABLET | Freq: Two times a day (BID) | ORAL | 1 refills | Status: DC

## 2021-10-14 NOTE — Patient Instructions (Signed)
Medication Instructions:  Your physician has recommended you make the following change in your medication:  DECREASE Carvedilol to 6.25 mg twice daily  *If you need a refill on your cardiac medications before your next appointment, please call your pharmacy*   Lab Work: None ordered   Testing/Procedures: None ordered   Follow-Up: At [facility], you and your health needs are our priority.  As part of our continuing mission to provide you with exceptional heart care, we have created designated Provider Care Teams.  These Care Teams include your primary Cardiologist (physician) and Advanced Practice Providers (APPs -  Physician Assistants and Nurse Practitioners) who all work together to provide you with the care you need, when you need it.  Your next appointment:   6 month(s)  The format for your next appointment:   In Person  Provider:   You will follow up in the [facility] located at [facility]. Your provider will be: Roderic Palau, NP or Clint R. Fenton, PA-C    Thank you for choosing [facility]!!   Trinidad Curet, RN [phone]  Other Instructions   Important Information About Sugar

## 2021-10-14 NOTE — Progress Notes (Signed)
Electrophysiology Office Note   Date:  10/14/2021   ID:  JAH ALARID, DOB April 17, 1931, MRN [id number]  PCP:  Burnard Bunting, MD  Primary Electrophysiologist:  Zaraya Delauder Meredith Leeds, MD    No chief complaint on file.     History of Present Illness: Austin [name] is a 88 y.o. male who presents today for electrophysiology evaluation.     He has a history significant of melanoma of the right eyelid with subsequent visual loss, COPD, CAD, sleep apnea.  He was diagnosed with atrial fibrillation in 2017.  Is admitted to the hospital for dofetilide load and converted to sinus rhythm.  Unfortunately had more frequent episodes of atrial fibrillation.  Dofetilide was stopped and he was started on Multaq.  He had corneal deposits on amiodarone, though he did continue to have episodes of atrial fibrillation on Multaq.  He has since been started on amiodarone.  Today, denies symptoms of palpitations, chest pain, shortness of breath, orthopnea, PND, lower extremity edema, claudication, dizziness, presyncope, syncope, bleeding, or neurologic sequela. The patient is tolerating medications without difficulties.  Complaint today is fatigue.  He does not notice that he has been in atrial fibrillation.  He has been feeling fatigued over the last few months.  He is unsure as to the cause of his fatigue but he does not attribute it to any arrhythmia.  Past Medical History:  Diagnosis Date   Allergy    Asthma    BPH (benign prostatic hypertrophy)    CKD (chronic kidney disease) stage 3, GFR 30-59 ml/min (HCC)    Cough    Gout    Hypertensive chronic kidney disease    Melanoma in situ of right eyelid ([place])    OSA (obstructive sleep apnea)    Persistent atrial fibrillation [facility])    Past Surgical History:  Procedure Laterality Date   CARDIOVERSION [name] 06/05/2015   Procedure: CARDIOVERSION;  Surgeon: Sanda Klein, MD;  Location: [place];  Service: Cardiovascular;  Laterality: [name];    CARDIOVERSION [name] 01/04/2021   Procedure: CARDIOVERSION;  Surgeon: [name], MD;  Location: [place];  Service: Cardiovascular;  Laterality: [name];   CARDIOVERSION [name] 02/08/2021   Procedure: CARDIOVERSION;  Surgeon: Jerline Pain, MD;  Location: [place];  Service: Cardiovascular;  Laterality: [name];   CARDIOVERSION [name] 04/14/2021   Procedure: CARDIOVERSION;  Surgeon: Freada Bergeron, MD;  Location: [place];  Service: Cardiovascular;  Laterality: [name];   CARDIOVERSION [name] 07/05/2021   Procedure: CARDIOVERSION;  Surgeon: Fay Records, MD;  Location: [place];  Service: Cardiovascular;  Laterality: [name];   EYE SURGERY  12/2017     Current Outpatient Medications  Medication Sig Dispense Refill   acetaminophen (TYLENOL) 500 MG tablet Take 500 mg by mouth every 6 (six) hours as needed (pain.).     albuterol (VENTOLIN HFA) 108 (90 Base) MCG/ACT inhaler Inhale 2 puffs into the lungs every 4 (four) hours as needed for wheezing or shortness of breath (cough).     amiodarone (PACERONE) 200 MG tablet TAKE 1 TABLET BY MOUTH EVERY DAY 90 tablet 3   amLODipine (NORVASC) 2.5 MG tablet TAKE 1 TABLET BY MOUTH EVERY DAY 90 tablet 1   benzonatate (TESSALON) 100 MG capsule Take 100 mg by mouth 3 (three) times daily as needed for cough.     budesonide-formoterol (SYMBICORT) 80-4.5 MCG/ACT inhaler Inhale 2 puffs into the lungs 2 (two) times daily.      Butalbital-APAP-Caff-Cod 50-300-40-30 MG CAPS Take 1 capsule by mouth  every 4 (four) hours as needed (migraines).     Cholecalciferol (VITAMIN D3 PO) Take 1 capsule by mouth daily.     clobetasol cream (TEMOVATE) 9.37 % Apply 1 application topically 2 (two) times daily as needed (for itching).     EPINEPHrine 0.3 mg/0.3 mL IJ SOAJ injection Inject 0.3 mg into the muscle as needed for anaphylaxis. 1 each 0   fluticasone (FLONASE) 50 MCG/ACT nasal spray Place 2 sprays into both nostrils in the morning and at bedtime.  8   furosemide (LASIX)  20 MG tablet TAKE 1 TABLET BY MOUTH EVERY DAY 90 tablet 1   guaiFENesin (MUCINEX) 600 MG 12 hr tablet Take 600 mg by mouth in the morning.     hydrOXYzine (ATARAX/VISTARIL) 25 MG tablet Take 50 mg by mouth at bedtime. itching     indomethacin (INDOCIN) 50 MG capsule Take 50 mg by mouth 3 (three) times daily as needed (for gout flare up).     isosorbide mononitrate (IMDUR) 60 MG 24 hr tablet TAKE 1 TABLET BY MOUTH EVERY DAY 90 tablet 2   KLOR-CON M20 20 MEQ tablet TAKE 1 TABLET BY MOUTH EVERY DAY 90 tablet 3   lidocaine-prilocaine (EMLA) cream Apply 1 application topically as needed (itching).     MAGNESIUM CITRATE PO Take 250 mg by mouth at bedtime.     olmesartan (BENICAR) 40 MG tablet TAKE 1 TABLET EVERY DAY **REPLACES LOSARTAN** 90 tablet 2   Polyethyl Glycol-Propyl Glycol 0.4-0.3 % SOLN Place 1 drop into the left eye 2 (two) times daily. Systane     promethazine (PHENERGAN) 25 MG tablet Take 25 mg by mouth every 6 (six) hours as needed for nausea or vomiting.     sertraline (ZOLOFT) 50 MG tablet Take 50 mg by mouth at bedtime.     valACYclovir (VALTREX) 1000 MG tablet Take 1,000 mg by mouth at bedtime.     XARELTO 15 MG TABS tablet TAKE 1 TABLET BY MOUTH EVERY DAY WITH SUPPER 90 tablet 1   zolpidem (AMBIEN) 10 MG tablet Take 10 mg by mouth at bedtime.  5   carvedilol (COREG) 6.25 MG tablet Take 1 tablet (6.25 mg total) by mouth 2 (two) times daily. 180 tablet 1   No current facility-administered medications for this visit.    Allergies:   Bee venom, Codeine, Iodine, Oxycodone, Penicillins, Diltiazem, Diatrizoate, Ivp dye [iodinated contrast media], Tape, and Yohimbine   Social History:  The patient  reports that he has never smoked. He has never used smokeless tobacco. He reports current alcohol use of about 1.0 standard drink per week. He reports that he does not use drugs.   Family History:  The patient's family history includes Hypertension in his father and mother; Lung cancer in his  father.   ROS:  Please see the history of present illness.   Otherwise, review of systems is positive for none.   All other systems are reviewed and negative.   PHYSICAL EXAM: VS:  BP 122/72   Pulse (!) 56   Ht '5\' 8"'$  (1.727 m)   Wt 156 lb 6.4 oz (70.9 kg)   SpO2 92%   BMI 23.78 kg/m  , BMI Body mass index is 23.78 kg/m. GEN: Well nourished, well developed, in no acute distress  HEENT: normal  Neck: no JVD, carotid bruits, or masses Cardiac: RRR; no murmurs, rubs, or gallops,no edema  Respiratory:  clear to auscultation bilaterally, normal work of breathing GI: soft, nontender, nondistended, + BS MS: no  deformity or atrophy  Skin: warm and dry Neuro:  Strength and sensation are intact Psych: euthymic mood, full affect  EKG:  EKG is ordered today. Personal review of the ekg ordered shows sinus rhythm  Recent Labs: 12/30/2020: Magnesium 2.2 07/05/2021: ALT 13; BUN 22; Creatinine, Ser 1.70; Hemoglobin 9.8; Platelets 370; Potassium 4.1; Sodium 140; TSH 2.625    Lipid Panel  No results found for: CHOL, TRIG, HDL, CHOLHDL, VLDL, LDLCALC, LDLDIRECT   Wt Readings from Last 3 Encounters:  10/14/21 156 lb 6.4 oz (70.9 kg)  07/14/21 157 lb 3.2 oz (71.3 kg)  07/05/21 162 lb 6.4 oz (73.7 kg)      Other studies Reviewed: Additional studies/ records that were reviewed today include: TTE 10/09/19  1. Left ventricular ejection fraction, by estimation, is 60 to 65%. The  left ventricle has normal function. The left ventricle has no regional  wall motion abnormalities. There is severe left ventricular hypertrophy.  Left ventricular diastolic parameters   are consistent with Grade II diastolic dysfunction (pseudonormalization).  Elevated left atrial pressure.   2. Right ventricular systolic function is mildly reduced. The right  ventricular size is normal. There is normal pulmonary artery systolic  pressure.   3. The mitral valve is normal in structure. Trivial mitral valve   regurgitation.   4. The aortic valve was not well visualized. Aortic valve regurgitation  is mild. Mild aortic valve stenosis. Vmax 2.3 m/s, Mg 9 mmHg, AVA 1.3  cm^2, DI 0.43   5. Aortic dilatation noted. There is mild dilatation of the aortic root  measuring 40 mm. Mild dilatation of the ascending aorta measuring 70m   6. Left atrial size was mildly dilated.   7. Severe asymmetric hypertrophy of basal septum. Also with apical  hypertrophy with cavity obliteration during systole. Consider cardiac MRI  to evaluate for hypertrophic cardiomyopathy or amyloidosis.    ASSESSMENT AND PLAN:  1.  Persistent atrial fibrillation: Currently on amiodarone 200 mg daily, Xarelto 15 mg daily.  CHA2DS2-VASc of 3.  He fortunately remains in normal rhythm.  He is fatigue.  It could be his carvedilol.  We Daquawn Seelman reduce the dose to 6.25 mg twice daily.  He continues to have fatigue, [place] likely switch to Toprol-XL.  2.  Hypertension: Currently well controlled  3.  Secondary hypercoagulable state: Currently on Xarelto for atrial fibrillation as above.  Current medicines are reviewed at length with the patient today.   The patient does not have concerns regarding his medicines.  The following changes were made today: None  Labs/ tests ordered today include:  Orders Placed This Encounter  Procedures   EKG 12-Lead      Disposition:   FU 6 months  Signed, Eila Runyan MMeredith Leeds MD  10/14/2021 12:17 PM     [place] [phone](office) [phone](fax)

## 2021-11-02 DIAGNOSIS — C44612 Basal cell carcinoma of skin of right upper limb, including shoulder: Secondary | ICD-10-CM | POA: Diagnosis not present

## 2021-11-02 DIAGNOSIS — D0461 Carcinoma in situ of skin of right upper limb, including shoulder: Secondary | ICD-10-CM | POA: Diagnosis not present

## 2021-11-02 DIAGNOSIS — L570 Actinic keratosis: Secondary | ICD-10-CM | POA: Diagnosis not present

## 2021-11-02 DIAGNOSIS — L821 Other seborrheic keratosis: Secondary | ICD-10-CM | POA: Diagnosis not present

## 2021-11-02 DIAGNOSIS — C44629 Squamous cell carcinoma of skin of left upper limb, including shoulder: Secondary | ICD-10-CM | POA: Diagnosis not present

## 2021-11-02 DIAGNOSIS — L942 Calcinosis cutis: Secondary | ICD-10-CM | POA: Diagnosis not present

## 2021-11-02 DIAGNOSIS — Z85828 Personal history of other malignant neoplasm of skin: Secondary | ICD-10-CM | POA: Diagnosis not present

## 2021-11-02 DIAGNOSIS — L814 Other melanin hyperpigmentation: Secondary | ICD-10-CM | POA: Diagnosis not present

## 2021-11-12 DIAGNOSIS — I129 Hypertensive chronic kidney disease with stage 1 through stage 4 chronic kidney disease, or unspecified chronic kidney disease: Secondary | ICD-10-CM | POA: Diagnosis not present

## 2021-11-12 DIAGNOSIS — J449 Chronic obstructive pulmonary disease, unspecified: Secondary | ICD-10-CM | POA: Diagnosis not present

## 2021-11-12 DIAGNOSIS — I4891 Unspecified atrial fibrillation: Secondary | ICD-10-CM | POA: Diagnosis not present

## 2021-11-15 ENCOUNTER — Observation Stay (HOSPITAL_BASED_OUTPATIENT_CLINIC_OR_DEPARTMENT_OTHER): Payer: Medicare PPO

## 2021-11-15 ENCOUNTER — Emergency Department (HOSPITAL_COMMUNITY): Payer: Medicare PPO

## 2021-11-15 ENCOUNTER — Encounter (HOSPITAL_COMMUNITY): Payer: Self-pay

## 2021-11-15 ENCOUNTER — Observation Stay (HOSPITAL_COMMUNITY)
Admission: EM | Admit: 2021-11-15 | Discharge: 2021-11-16 | Disposition: A | Discharge status: Home / Self Care | Payer: Medicare PPO | Attending: Family Medicine | Admitting: Family Medicine

## 2021-11-15 ENCOUNTER — Other Ambulatory Visit: Payer: Self-pay

## 2021-11-15 DIAGNOSIS — I350 Nonrheumatic aortic (valve) stenosis: Secondary | ICD-10-CM | POA: Diagnosis not present

## 2021-11-15 DIAGNOSIS — R319 Hematuria, unspecified: Secondary | ICD-10-CM | POA: Diagnosis not present

## 2021-11-15 DIAGNOSIS — I10 Essential (primary) hypertension: Secondary | ICD-10-CM | POA: Diagnosis not present

## 2021-11-15 DIAGNOSIS — S065X0A Traumatic subdural hemorrhage without loss of consciousness, initial encounter: Principal | ICD-10-CM | POA: Insufficient documentation

## 2021-11-15 DIAGNOSIS — I130 Hypertensive heart and chronic kidney disease with heart failure and stage 1 through stage 4 chronic kidney disease, or unspecified chronic kidney disease: Secondary | ICD-10-CM | POA: Diagnosis not present

## 2021-11-15 DIAGNOSIS — I5033 Acute on chronic diastolic (congestive) heart failure: Secondary | ICD-10-CM

## 2021-11-15 DIAGNOSIS — I5030 Unspecified diastolic (congestive) heart failure: Secondary | ICD-10-CM | POA: Insufficient documentation

## 2021-11-15 DIAGNOSIS — N183 Chronic kidney disease, stage 3 unspecified: Secondary | ICD-10-CM | POA: Diagnosis not present

## 2021-11-15 DIAGNOSIS — S065XAA Traumatic subdural hemorrhage with loss of consciousness status unknown, initial encounter: Secondary | ICD-10-CM

## 2021-11-15 DIAGNOSIS — D509 Iron deficiency anemia, unspecified: Secondary | ICD-10-CM | POA: Diagnosis not present

## 2021-11-15 DIAGNOSIS — Y92003 Bedroom of unspecified non-institutional (private) residence as the place of occurrence of the external cause: Secondary | ICD-10-CM | POA: Diagnosis not present

## 2021-11-15 DIAGNOSIS — I351 Nonrheumatic aortic (valve) insufficiency: Secondary | ICD-10-CM | POA: Diagnosis not present

## 2021-11-15 DIAGNOSIS — Z85828 Personal history of other malignant neoplasm of skin: Secondary | ICD-10-CM | POA: Insufficient documentation

## 2021-11-15 DIAGNOSIS — I6381 Other cerebral infarction due to occlusion or stenosis of small artery: Secondary | ICD-10-CM | POA: Diagnosis not present

## 2021-11-15 DIAGNOSIS — Z7901 Long term (current) use of anticoagulants: Secondary | ICD-10-CM | POA: Diagnosis not present

## 2021-11-15 DIAGNOSIS — W19XXXA Unspecified fall, initial encounter: Secondary | ICD-10-CM

## 2021-11-15 DIAGNOSIS — I4819 Other persistent atrial fibrillation: Secondary | ICD-10-CM | POA: Insufficient documentation

## 2021-11-15 DIAGNOSIS — W0110XA Fall on same level from slipping, tripping and stumbling with subsequent striking against unspecified object, initial encounter: Secondary | ICD-10-CM | POA: Insufficient documentation

## 2021-11-15 DIAGNOSIS — G4489 Other headache syndrome: Secondary | ICD-10-CM | POA: Diagnosis not present

## 2021-11-15 DIAGNOSIS — J45909 Unspecified asthma, uncomplicated: Secondary | ICD-10-CM | POA: Diagnosis not present

## 2021-11-15 DIAGNOSIS — H5440 Blindness, one eye, unspecified eye: Secondary | ICD-10-CM

## 2021-11-15 DIAGNOSIS — R519 Headache, unspecified: Secondary | ICD-10-CM | POA: Diagnosis present

## 2021-11-15 DIAGNOSIS — D649 Anemia, unspecified: Secondary | ICD-10-CM

## 2021-11-15 DIAGNOSIS — Z9001 Acquired absence of eye: Secondary | ICD-10-CM

## 2021-11-15 LAB — CBC
HCT: 29.8 % — ABNORMAL LOW (ref 39.0–52.0)
Hemoglobin: 8.5 g/dL — ABNORMAL LOW (ref 13.0–17.0)
MCH: 22.3 pg — ABNORMAL LOW (ref 26.0–34.0)
MCHC: 28.5 g/dL — ABNORMAL LOW (ref 30.0–36.0)
MCV: 78.2 fL — ABNORMAL LOW (ref 80.0–100.0)
Platelets: 337 10*3/uL (ref 150–400)
RBC: 3.81 MIL/uL — ABNORMAL LOW (ref 4.22–5.81)
RDW: 18.2 % — ABNORMAL HIGH (ref 11.5–15.5)
WBC: 9.5 10*3/uL (ref 4.0–10.5)
nRBC: 0 % (ref 0.0–0.2)

## 2021-11-15 LAB — BASIC METABOLIC PANEL
Anion gap: 6 (ref 5–15)
BUN: 16 mg/dL (ref 8–23)
CO2: 28 mmol/L (ref 22–32)
Calcium: 8.5 mg/dL — ABNORMAL LOW (ref 8.9–10.3)
Chloride: 108 mmol/L (ref 98–111)
Creatinine, Ser: 1.39 mg/dL — ABNORMAL HIGH (ref 0.61–1.24)
GFR, Estimated: 49 mL/min — ABNORMAL LOW (ref >60)
Glucose, Bld: 106 mg/dL — ABNORMAL HIGH (ref 70–99)
Potassium: 3.5 mmol/L (ref 3.5–5.1)
Sodium: 142 mmol/L (ref 135–145)

## 2021-11-15 LAB — LACTATE DEHYDROGENASE: LDH: 160 U/L (ref 98–192)

## 2021-11-15 LAB — ECHOCARDIOGRAM COMPLETE
AR max vel: 1.3 cm2
AV Area VTI: 1.16 cm2
AV Area mean vel: 1.34 cm2
AV Mean grad: 15 mmHg
AV Peak grad: 27.2 mmHg
Ao pk vel: 2.61 m/s
Area-P 1/2: 3.74 cm2
Height: 68 in
S' Lateral: 3.4 cm
Weight: 2400 oz

## 2021-11-15 LAB — POC OCCULT BLOOD, ED: Fecal Occult Bld: NEGATIVE

## 2021-11-15 LAB — IRON AND TIBC
Iron: 14 ug/dL — ABNORMAL LOW (ref 45–182)
Saturation Ratios: 3 % — ABNORMAL LOW (ref 17.9–39.5)
TIBC: 434 ug/dL (ref 250–450)
UIBC: 420 ug/dL

## 2021-11-15 LAB — RETICULOCYTES
Immature Retic Fract: 29.6 % — ABNORMAL HIGH (ref 2.3–15.9)
RBC.: 3.88 MIL/uL — ABNORMAL LOW (ref 4.22–5.81)
Retic Count, Absolute: 70.2 10*3/uL (ref 19.0–186.0)
Retic Ct Pct: 1.8 % (ref 0.4–3.1)

## 2021-11-15 LAB — PROTIME-INR
INR: 1.9 — ABNORMAL HIGH (ref 0.8–1.2)
Prothrombin Time: 21.2 seconds — ABNORMAL HIGH (ref 11.4–15.2)

## 2021-11-15 LAB — VITAMIN B12: Vitamin B-12: 337 pg/mL (ref 180–914)

## 2021-11-15 LAB — FOLATE: Folate: 16.3 ng/mL (ref >5.9)

## 2021-11-15 LAB — FERRITIN: Ferritin: 14 ng/mL — ABNORMAL LOW (ref 24–336)

## 2021-11-15 MED ORDER — POTASSIUM CHLORIDE CRYS ER 20 MEQ PO TBCR: 20.0000 meq | EXTENDED_RELEASE_TABLET | Freq: Every day | ORAL | Status: DC

## 2021-11-15 MED ORDER — ISOSORBIDE MONONITRATE ER 30 MG PO TB24: 60.0000 mg | ORAL_TABLET | Freq: Every day | ORAL | Status: DC

## 2021-11-15 MED ORDER — AMLODIPINE BESYLATE 2.5 MG PO TABS
2.5000 mg | ORAL_TABLET | Freq: Every day | ORAL | Status: DC
  Administered 2021-11-15 – 2021-11-16 (×3): 2.5 mg via ORAL
  Filled 2021-11-15 (×3): qty 1

## 2021-11-15 MED ORDER — HYDRALAZINE HCL 20 MG/ML IJ SOLN
5.0000 mg | Freq: Once | INTRAMUSCULAR | Status: AC
  Administered 2021-11-15: 5 mg via INTRAVENOUS
  Filled 2021-11-15: qty 1

## 2021-11-15 MED ORDER — AMLODIPINE BESYLATE 5 MG PO TABS: 2.5000 mg | ORAL_TABLET | Freq: Every day | ORAL | Status: DC

## 2021-11-15 MED ORDER — METOCLOPRAMIDE HCL 5 MG/ML IJ SOLN
5.0000 mg | Freq: Once | INTRAMUSCULAR | Status: AC
  Administered 2021-11-15: 5 mg via INTRAVENOUS
  Filled 2021-11-15: qty 2

## 2021-11-15 MED ORDER — MOMETASONE FURO-FORMOTEROL FUM 100-5 MCG/ACT IN AERO
2.0000 | INHALATION_SPRAY | Freq: Two times a day (BID) | RESPIRATORY_TRACT | Status: DC
  Administered 2021-11-15 – 2021-11-16 (×3): 2 via RESPIRATORY_TRACT
  Filled 2021-11-15: qty 8.8

## 2021-11-15 MED ORDER — AMIODARONE HCL 200 MG PO TABS
200.0000 mg | ORAL_TABLET | Freq: Every day | ORAL | Status: DC
  Administered 2021-11-15 – 2021-11-16 (×2): 200 mg via ORAL
  Filled 2021-11-15 (×2): qty 1

## 2021-11-15 MED ORDER — POTASSIUM CHLORIDE CRYS ER 20 MEQ PO TBCR
20.0000 meq | EXTENDED_RELEASE_TABLET | Freq: Every day | ORAL | Status: DC
  Administered 2021-11-15 – 2021-11-16 (×2): 20 meq via ORAL
  Filled 2021-11-15 (×2): qty 1

## 2021-11-15 MED ORDER — LIDOCAINE 5 % EX PTCH
1.0000 | MEDICATED_PATCH | CUTANEOUS | Status: DC
  Administered 2021-11-15 – 2021-11-16 (×2): 1 via TRANSDERMAL
  Filled 2021-11-15 (×2): qty 1

## 2021-11-15 MED ORDER — CARVEDILOL 6.25 MG PO TABS
6.2500 mg | ORAL_TABLET | Freq: Two times a day (BID) | ORAL | Status: DC
  Administered 2021-11-15 – 2021-11-16 (×2): 6.25 mg via ORAL
  Filled 2021-11-15 (×2): qty 1

## 2021-11-15 MED ORDER — VALACYCLOVIR HCL 500 MG PO TABS
1000.0000 mg | ORAL_TABLET | Freq: Every day | ORAL | Status: DC
  Administered 2021-11-15: 1000 mg via ORAL
  Filled 2021-11-15 (×2): qty 2

## 2021-11-15 MED ORDER — METOCLOPRAMIDE HCL 5 MG/ML IJ SOLN
5.0000 mg | Freq: Four times a day (QID) | INTRAMUSCULAR | Status: DC | PRN
Start: 2021-11-15 — End: 2021-11-16
  Filled 2021-11-15: qty 2

## 2021-11-15 MED ORDER — IRBESARTAN 300 MG PO TABS
300.0000 mg | ORAL_TABLET | Freq: Every day | ORAL | Status: DC
  Administered 2021-11-15 – 2021-11-16 (×2): 300 mg via ORAL
  Filled 2021-11-15 (×3): qty 1

## 2021-11-15 MED ORDER — HYDRALAZINE HCL 20 MG/ML IJ SOLN
10.0000 mg | Freq: Once | INTRAMUSCULAR | Status: AC
  Administered 2021-11-15: 10 mg via INTRAVENOUS
  Filled 2021-11-15: qty 1

## 2021-11-15 MED ORDER — FUROSEMIDE 20 MG PO TABS
20.0000 mg | ORAL_TABLET | Freq: Every day | ORAL | Status: DC
  Administered 2021-11-15 – 2021-11-16 (×2): 20 mg via ORAL
  Filled 2021-11-15 (×2): qty 1

## 2021-11-15 MED ORDER — SERTRALINE HCL 50 MG PO TABS
50.0000 mg | ORAL_TABLET | Freq: Every day | ORAL | Status: DC
  Administered 2021-11-15: 50 mg via ORAL
  Filled 2021-11-15: qty 1

## 2021-11-15 MED ORDER — ISOSORBIDE MONONITRATE ER 60 MG PO TB24
60.0000 mg | ORAL_TABLET | Freq: Every day | ORAL | Status: DC
  Administered 2021-11-15 – 2021-11-16 (×2): 60 mg via ORAL
  Filled 2021-11-15: qty 1
  Filled 2021-11-15: qty 2

## 2021-11-15 MED ORDER — HYDROCODONE-ACETAMINOPHEN 5-325 MG PO TABS
1.0000 | ORAL_TABLET | Freq: Four times a day (QID) | ORAL | Status: DC | PRN
  Administered 2021-11-15 – 2021-11-16 (×2): 1 via ORAL
  Filled 2021-11-15 (×2): qty 1

## 2021-11-15 MED ORDER — DIPHENHYDRAMINE HCL 50 MG/ML IJ SOLN
12.5000 mg | Freq: Once | INTRAMUSCULAR | Status: AC
Start: 2021-11-15 — End: 2021-11-15
  Administered 2021-11-15: 12.5 mg via INTRAVENOUS
  Filled 2021-11-15: qty 1

## 2021-11-15 MED ORDER — ACETAMINOPHEN 325 MG PO TABS
650.0000 mg | ORAL_TABLET | Freq: Once | ORAL | Status: AC
  Administered 2021-11-15: 650 mg via ORAL
  Filled 2021-11-15: qty 2

## 2021-11-15 NOTE — ED Provider Notes (Signed)
[facility] Emergency Department Provider Note MRN:  [id number]  Arrival date & time: 11/15/21     Chief Complaint   Fall   History of Present Illness   Austin [name] is a 88 y.o. year-old male with a history of CKD presenting to the ED with chief complaint of fall.  Getting out of bed and tripped on his pajama bottoms, explains they were too long.  Hit the back of his head, complaining of headache.  No other complaints.  No neck pain, no back pain, no chest pain, no shortness of breath, no abdominal pain, no injuries to the arms or legs.  Has some bandaged abrasions to the arms and legs from a fall several days ago, says they are healing well.  Takes blood thinners.  Arriving as a level 2 trauma.  Review of Systems  A thorough review of systems was obtained and all systems are negative except as noted in the HPI and PMH.   Patient's Health History    Past Medical History:  Diagnosis Date   Allergy    Asthma    BPH (benign prostatic hypertrophy)    CKD (chronic kidney disease) stage 3, GFR 30-59 ml/min (HCC)    Cough    Gout    Hypertensive chronic kidney disease    Melanoma in situ of right eyelid (HCC)    OSA (obstructive sleep apnea)    Persistent atrial fibrillation [facility])     Past Surgical History:  Procedure Laterality Date   CARDIOVERSION [name] 06/05/2015   Procedure: CARDIOVERSION;  Surgeon: Sanda Klein, MD;  Location: [place];  Service: Cardiovascular;  Laterality: [name];   CARDIOVERSION [name] 01/04/2021   Procedure: CARDIOVERSION;  Surgeon: [name], MD;  Location: [place];  Service: Cardiovascular;  Laterality: [name];   CARDIOVERSION [name] 02/08/2021   Procedure: CARDIOVERSION;  Surgeon: Jerline Pain, MD;  Location: [facility] ENDOSCOPY;  Service: Cardiovascular;  Laterality: [name];   CARDIOVERSION [name] 04/14/2021   Procedure: CARDIOVERSION;  Surgeon: Freada Bergeron, MD;  Location: [place];  Service: Cardiovascular;   Laterality: [name];   CARDIOVERSION [name] 07/05/2021   Procedure: CARDIOVERSION;  Surgeon: Fay Records, MD;  Location: [facility] ENDOSCOPY;  Service: Cardiovascular;  Laterality: [name];   EYE SURGERY  12/2017    Family History  Problem Relation Age of Onset   Hypertension Mother    Hypertension Father    Lung cancer Father    Sleep apnea Neg Hx     Social History   Socioeconomic History   Marital status: Married    Spouse name: Austin [name]   Number of children: Not on file   Years of education: Not on file   Highest education level: Not on file  Occupational History   Not on file  Tobacco Use   Smoking status: Never   Smokeless tobacco: Never  Vaping Use   Vaping Use: Never used  Substance and Sexual Activity   Alcohol use: Yes    Alcohol/week: 1.0 standard drink of alcohol    Types: 1 Standard drinks or equivalent per week    Comment: occ   Drug use: No   Sexual activity: Not on file  Other Topics Concern   Not on file  Social History Narrative   Right Handed.  Caffiene  Coffee, tea 3 cups daily.   Pt retired.     Lives at home with wife.     Social Determinants of Health   Financial Resource Strain: Not on file  Food Insecurity: Not on file  Transportation Needs: Not on file  Physical Activity: Not on file  Stress: Not on file  Social Connections: Not on file  Intimate Partner Violence: Not on file     Physical Exam   Vitals:   11/15/21 0612 11/15/21 0615  BP: (!) 197/92 (!) 196/87  Pulse: (!) 58 (!) 58  Resp: 13 14  Temp: 98.1 F (36.7 C)   SpO2: 100% 96%    CONSTITUTIONAL: Well-appearing, NAD NEURO/PSYCH:  Alert and oriented x 3, no focal deficits EYES:  eyes equal and reactive ENT/NECK:  no LAD, no JVD CARDIO: Regular rate, well-perfused, normal S1 and S2 PULM:  CTAB no wheezing or rhonchi GI/GU:  non-distended, non-tender MSK/SPINE:  No gross deformities, no edema SKIN:  no rash, atraumatic   *Additional and/or pertinent findings included in MDM  below  Diagnostic and Interventional Summary    EKG Interpretation  Date/Time:    Ventricular Rate:    PR Interval:    QRS Duration:   QT Interval:    QTC Calculation:   R Axis:     Text Interpretation:         Labs Reviewed  CBC  BASIC METABOLIC PANEL  PROTIME-INR    CT HEAD WO CONTRAST (5MM)  Final Result      Medications - No data to display   Procedures  /  Critical Care .Critical Care  Performed by: Maudie Flakes, MD Authorized by: Maudie Flakes, MD   Critical care provider statement:    Critical care time (minutes):  35   Critical care was necessary to treat or prevent imminent or life-threatening deterioration of the following conditions:  CNS failure or compromise   Critical care was time spent personally by me on the following activities:  Development of treatment plan with patient or surrogate, discussions with consultants, evaluation of patient's response to treatment, examination of patient, ordering and review of laboratory studies, ordering and review of radiographic studies, ordering and performing treatments and interventions, pulse oximetry, re-evaluation of patient's condition and review of old charts   ED Course and Medical Decision Making  Initial Impression and Ddx Head trauma, no loss conscious, nausea vomiting, no other injuries or complaints.  Anticoagulated, will obtain CT head.  Dissipating discharge.  Past medical/surgical history that increases complexity of ED encounter: Anticoagulated  Interpretation of Diagnostics I personally reviewed the CT head and my interpretation is as follows: [name] subdural    Patient Reassessment and Ultimate Disposition/Management     Will consult neurosurgery for recommendations.  Signed out to oncoming provider at shift change.  Patient management required discussion with the following services or consulting groups:  Neurosurgery  Complexity of Problems Addressed Acute illness or injury that  poses threat of life of bodily function  Additional Data Reviewed and Analyzed Further history obtained from: None  Additional Factors Impacting ED Encounter Risk Consideration of hospitalization  Austin [name]. Austin Small, MD [place] [email or web address]  Final Clinical Impressions(s) / ED Diagnoses     ICD-10-CM   1. Subdural hematoma ([place])  S06.[place]       ED Discharge Orders     None        Discharge Instructions Discussed with and Provided to Patient:   Discharge Instructions   None      Maudie Flakes, MD 11/15/21 [phone]

## 2021-11-15 NOTE — Progress Notes (Signed)
Orthopedic Tech Progress Note Patient Details:  Austin [name] 07/17/1930 [id number]  Patient ID: [name], male   DOB: 05-10-1931, 88 y.o.   MRN: [id number] I attended trauma page. Karolee Stamps 11/15/2021, 6:35 AM

## 2021-11-15 NOTE — ED Notes (Signed)
MD Pearline Cables made aware of pts hypertension

## 2021-11-15 NOTE — Assessment & Plan Note (Addendum)
Post-traumatic. 63m without mass effect and an unremarkable neuro exam, here for obs.  - Neurosurgery following - Repeat CT tomorrow am - Neurochecks q2h  - Symptomatic management of headache, avoid benadryl per patient preference  - PT/OT given multiple mechanical falls  - Fall precautions - Elevate head of bed

## 2021-11-15 NOTE — ED Provider Notes (Signed)
   Provider Note MRN:  [id number]  Arrival date & time: 11/15/21    ED Course and Medical Decision Making  Assumed care from [facility] at shift change.  See not from prior team for complete details, in brief:   88yo male hx CKD on [facility] for persistent afib Tripped over pajama pants Hit his head w/o LOC Acute right frontoparietal subdural on eliquis Neuro exam is non-focal Will d/w nsgy on call   Plan per prior physician f/u labs and NSGY  Hemoglobin is reduced from prior, he is on Eliquis.  Does report chronic hematuria that is grossly unchanged.  Denies melena or BRBPR.  He had a fall recently and does have some increased bruising but nothing atypical for him.  Rectal exam completed with chaperone RN Paulla Fore.  Hemoccult negative.  Microcytic anemia.?Iron deficiency anemia. Send anemia panel.  9:36 AM Awaiting callback from neurosurgery.  Neurologically patient is intact.  He does have a headache, no significant improvement with Tylenol.  Will give Reglan and Benadryl.  headache is frontal, throbbing, squeezing.  No nausea or vomiting.  BP improved with hydralazine.    9:43 AM Received call back neurosurgery APP, recommend medical admission given he is anticoagulated.  Discussed with [facility] who accepts pt for admission. He is HDS. Stable for admission.    CRITICAL CARE Performed by: Jeanell Sparrow   Total critical care time: 31 minutes  Critical care time was exclusive of separately billable procedures and treating other patients.  Critical care was necessary to treat or prevent imminent or life-threatening deterioration.  Critical care was time spent personally by me on the following activities: development of treatment plan with patient and/or surrogate as well as nursing, discussions with consultants, evaluation of patient's response to treatment, examination of patient, obtaining history from patient or surrogate, ordering and performing treatments and interventions, ordering and  review of laboratory studies, ordering and review of radiographic studies, pulse oximetry and re-evaluation of patient's condition. Subdural on [facility]   Procedures  Final Clinical Impressions(s) / ED Diagnoses     ICD-10-CM   1. Subdural hematoma ([place])  S06.5XAA     2. Anemia, unspecified type  D64.9       ED Discharge Orders     None       Discharge Instructions   None       Jeanell Sparrow, DO 11/15/21 [phone]

## 2021-11-15 NOTE — Assessment & Plan Note (Signed)
Hgb 8.5 today, has been steadily downtrending over past year. MCV 78.2. Hemoccult negative. Small subdural unlikely to be contributing to anemia of this degree. Suspect his gross hematuria may be contributing, will need outpatient follow-up with urology. - F/u anemia panel, can consider IV iron while hosiptalized if panel suggestive of IDA

## 2021-11-15 NOTE — ED Notes (Signed)
Pt off unit with transportation.

## 2021-11-15 NOTE — ED Notes (Signed)
Mr. Manheim off unit again .Marland KitchenWife going to get pt food from home.

## 2021-11-15 NOTE — Assessment & Plan Note (Signed)
BP markedly elevated on admission to 190s/90s. Got '10mg'$  hydralazine x1 with improvement to 170s/80s.  - Restart home amlodipine, Coreg, Irbesartan (replaces Olmesartan), Imdur

## 2021-11-15 NOTE — H&P (Signed)
Hospital Admission History and Physical Service Pager: [phone]  Patient name: Austin [name] Medical record number: [id number] Date of Birth: 06/18/1930 Age: 88 y.o. Gender: male  Primary Care Provider: Burnard Bunting, MD Consultants: Neurosurgery Code Status: DNR  Preferred Emergency Contact:  Extended Emergency Contact Information Primary Emergency Contact: Austin [name] Address: [address]          [place], [place] [address] [place] Phone: [phone] Mobile Phone: [phone] Relation: Spouse Secondary Emergency Contact: Austin [name] [place] Phone: [phone] Relation: Daughter   Chief Complaint: Fall at home, now with headache   Assessment and Plan: Austin [name] is a 88 y.o. 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 He is admitted for overnight observation and repeat CT scan tomorrow morning, if stable, anticipate discharge tomorrow.   * Subdural hematoma (HCC) Post-traumatic. 51m without mass effect and an unremarkable neuro exam, here for obs.  - Neurosurgery following - Repeat CT tomorrow am - Neurochecks q2h  - Symptomatic management of headache, avoid benadryl per patient preference  - PT/OT given multiple mechanical falls  - Fall precautions - Elevate head of bed  Atrial fibrillation, persistent (HCC) In sinus rhythm during assessment. - Continue amiodarone '200mg'$  daily - Hold Xarelto for at least 1 week - Maintain cardiac monitors  Anemia Hgb 8.5 today, has been steadily downtrending over past year. MCV 78.2. Hemoccult negative. Small subdural unlikely to be contributing to anemia of this degree. Suspect his gross hematuria may be contributing, will need outpatient follow-up with urology. - F/u anemia panel, can consider IV iron while hosiptalized if  panel suggestive of IDA   Hematuria - UA with reflex to microscopy and culture to rule out infection  - Will need outpatient follow-up with urology for likely cystoscopy  (HFpEF) heart failure with preserved ejection fraction ([place] Euvolemic on exam. Last echo in 05/21 with LVEF 60-65%. Grade II DDF. Mild aortic stenosis. Also with mention of severe asymmetric hypertrophy of the basal septum and apical hypertrophy with cavity obliteration during systole. Pt has follow-up with cardiology. - Continue home Imdur, Lasix, Coreg, ARB, and amlodipine  Essential hypertension BP markedly elevated on admission to 190s/90s. Got '10mg'$  hydralazine x1 with improvement to 170s/80s.  - Restart home amlodipine, Coreg, Irbesartan (replaces Olmesartan), Imdur    Chronic Stable Conditions Asthma- Dulera 2 puff BID OSA- CPAP nightly  CKD 3 - Cr at baseline 1.39 (BL 1.3-1.4)  FEN/GI: Regular diet VTE Prophylaxis: SCDs  Disposition: Med-Tele   History of Present Illness:  Austin ZORNis a 88y.o. male presenting with a fall at home, found to have a subdural hematoma without mass effect.  He was in his usual state of health until last night when he had a mechanical fall, tripping over the bottoms of his pajama pants.  He hit the front right side of his head per his wife report. This is his second fall within a week, however both were mechanical tripping incidents.  He has not had loss of consciousness with either event. Since his fall he has had a headache and had transient dizziness but no vision changes.  He denies any weakness or sensory changes.  He is otherwise feeling like himself.  He gets headaches at baseline and takes Fioricet for them.  This happens  about once every 2 weeks at home.  He has been told by doctors in the past not to take Benadryl due to a previous episode of flipping into A-fib after taking a dose of Benadryl.  In discussing his anemia, he endorses chronic hematuria and  reports that this is common for members of his family.  In the ED, he had a rectal exam with negative Hemoccult.  In the ED, neurosurgery was consulted due to CT findings consistent with small subdural hematoma and recommended observation overnight with repeat CT in the morning and q2h neurochecks.   Review Of Systems: Per HPI with the following additions:  Review of Systems  Constitutional:  Negative for fever.  Gastrointestinal:  Negative for blood in stool.  Genitourinary:  Positive for frequency and hematuria. Negative for dysuria and urgency.  Neurological:  Positive for dizziness and headaches. Negative for sensory change and focal weakness.  Endo/Heme/Allergies:  Bruises/bleeds easily (at baseline).     Pertinent Past Medical History: A Fib on Xarelto Prior melanoma of the eye s/p enucleation HFpEF  HTN CKD 3  Remainder reviewed in history tab.   Pertinent Past Surgical History: Enucleation of R eye 2021  Remainder reviewed in history tab.  Pertinent Social History: Tobacco use: No Alcohol use: Seldom Other Substance use: None Lives with wife  Pertinent Family History: Multiple male family members with hematuria  Remainder reviewed in history tab.   Important Outpatient Medications: Xarelto Remainder reviewed in medication history.   Objective: BP (!) 176/84   Pulse 63   Temp 98.1 F (36.7 C) (Oral)   Resp 14   Ht '5\' 8"'$  (1.727 m)   Wt 68 kg   SpO2 99%   BMI 22.81 kg/m  Exam: General: Well-appearing older gentleman, multiple scrapes and bruises with bandages and steristrips over skin tears Eyes: S/p R enucleation, L EOMs intact, sclerae normal ENTM: Mucous membranes moist Neck: Supple Cardiovascular: Regular rate and rhythm, 2/6 systolic murmur  Respiratory: Normal WOB on RA, lungs clear to auscultation throughout Gastrointestinal: Abdomen soft, non-tender, and non-distended MSK: Without edema or deformity Derm: Multiple bruises, abrasions, and skin  tears Neuro: CN II-XII intact aside from expected changes s/p R enucleation, strength and sensation intact throughout, mentation normal Psych: Mood and affect normal   Labs:  CBC BMET  Recent Labs  Lab 11/15/21 0753  WBC 9.5  HGB 8.5*  HCT 29.8*  PLT 337   Recent Labs  Lab 11/15/21 0753  NA 142  K 3.5  CL 108  CO2 28  BUN 16  CREATININE 1.39*  GLUCOSE 106*  CALCIUM 8.5*     EKG: Pending    Imaging Studies Performed: CT Head  IMPRESSION: 1. Acute right frontoparietal subdural hematoma measuring up to 6 mm in thickness. No significant mass effect upon the underlying brain parenchyma. 2. Chronic small vessel ischemic disease and brain atrophy. 3. Remote right basal ganglia lacunar infarct.   Eppie Gibson, MD 11/15/2021, 11:59 AM PGY-2, [place] Intern pager: [phone], text pages welcome Secure chat group [place]

## 2021-11-15 NOTE — ED Notes (Signed)
Pt off the unit.

## 2021-11-15 NOTE — Assessment & Plan Note (Signed)
Euvolemic on exam. Last echo in 05/21 with LVEF 60-65%. Grade II DDF. Mild aortic stenosis. Also with mention of severe asymmetric hypertrophy of the basal septum and apical hypertrophy with cavity obliteration during systole. Pt has follow-up with cardiology. - Continue home Imdur, Lasix, Coreg, ARB, and amlodipine

## 2021-11-15 NOTE — Consult Note (Signed)
Reason for Consult:SDH Referring Physician: EDP  Austin [name] is an 88 y.o. [name].   HPI:  88 year old [name] presented to the ED after sustaining a fall in the middle of the night. He fell and hit his head. Has a medical history of asthma, BPH, CKD, a fib, melanoma of the right eye, and OSA. Reports some headaches but no dizziness or vision changes. He is on eliquis for a fib.  Past Medical History:  Diagnosis Date   Allergy    Asthma    BPH (benign prostatic hypertrophy)    CKD (chronic kidney disease) stage 3, GFR 30-59 ml/min (HCC)    Cough    Gout    Hypertensive chronic kidney disease    Melanoma in situ of right eyelid (HCC)    OSA (obstructive sleep apnea)    Persistent atrial fibrillation ([place])     Past Surgical History:  Procedure Laterality Date   CARDIOVERSION [name] 06/05/2015   Procedure: CARDIOVERSION;  Surgeon: Sanda Klein, MD;  Location: [facility];  Service: Cardiovascular;  Laterality: [name];   CARDIOVERSION [name] 01/04/2021   Procedure: CARDIOVERSION;  Surgeon: [name], MD;  Location: [facility];  Service: Cardiovascular;  Laterality: [name];   CARDIOVERSION [name] 02/08/2021   Procedure: CARDIOVERSION;  Surgeon: Jerline Pain, MD;  Location: [facility];  Service: Cardiovascular;  Laterality: [name];   CARDIOVERSION [name] 04/14/2021   Procedure: CARDIOVERSION;  Surgeon: Freada Bergeron, MD;  Location: [facility];  Service: Cardiovascular;  Laterality: [name];   CARDIOVERSION [name] 07/05/2021   Procedure: CARDIOVERSION;  Surgeon: Fay Records, MD;  Location[facility];  Service: Cardiovascular;  Laterality: [name];   EYE SURGERY  12/2017    Allergies  Allergen Reactions   Bee Venom Anaphylaxis   Codeine Nausea And Vomiting and Itching   Iodine     Other reaction(s): Swollen Tongue   Oxycodone Nausea And Vomiting   Penicillins Swelling and Rash    Has patient had a PCN reaction causing immediate rash, facial/tongue/throat swelling, SOB or  lightheadedness with hypotension: Yes Has patient had a PCN reaction causing severe rash involving mucus membranes or skin necrosis: No Has patient had a PCN reaction that required hospitalization No Has patient had a PCN reaction occurring within the last 10 years: No If all of the above answers are "NO", then may proceed with Cephalosporin use.   Diltiazem Other (See Comments)    Severe headaches 120 mg BID   Diatrizoate Rash   Ivp Dye [Iodinated Contrast Media] Rash   Tape Itching and Rash    Redness.  Please use "paper" tape only   Yohimbine     Unknown    Social History   Tobacco Use   Smoking status: Never   Smokeless tobacco: Never  Substance Use Topics   Alcohol use: Yes    Alcohol/week: 1.0 standard drink of alcohol    Types: 1 Standard drinks or equivalent per week    Comment: occ    Family History  Problem Relation Age of Onset   Hypertension Mother    Hypertension Father    Lung cancer Father    Sleep apnea Neg Hx      Review of Systems  Positive ROS: as above  All other systems have been reviewed and were otherwise negative with the exception of those mentioned in the HPI and as above.  Objective: Vital signs in last 24 hours: Temp:  [98.1 F (Austin.7 C)] 98.1 F (Austin.7 C) (07/03 0612) Pulse  Rate:  [57-64] 64 (07/03 0915) Resp:  [10-19] 10 (07/03 0915) BP: (168-198)/(72-92) 168/72 (07/03 0915) SpO2:  [96 %-100 %] 98 % (07/03 0915) Weight:  [68 kg] 68 kg (07/03 0612)  General Appearance: Alert, cooperative, no distress, appears stated age Head: Normocephalic, without obvious abnormality, atraumatic Eyes: PERRL, conjunctiva/corneas clear, EOM's intact, fundi benign, no right eye      Lungs: respirations unlabored Heart: Regular rate and rhythm Extremities: Extremities normal, atraumatic, no cyanosis or edema Pulses: 2+ and symmetric all extremities Skin: Skin color, texture, turgor normal, no rashes or lesions  NEUROLOGIC:   Mental status: A&O x4,  no aphasia, good attention span, Memory and fund of knowledge Motor Exam - grossly normal, normal tone and bulk Sensory Exam - grossly normal Reflexes: symmetric, no pathologic reflexes, No Hoffman's, No clonus Coordination - grossly normal Gait - not tested Balance - not tested Cranial Nerves: I: smell Not tested  II: visual acuity  OS: na    OD: na  II: visual fields Full to confrontation  II: pupils Equal, round, reactive to light  III,VII: ptosis None  III,IV,VI: extraocular muscles  Full ROM  V: mastication Normal  V: facial light touch sensation  Normal  V,VII: corneal reflex  Present  VII: facial muscle function - upper  Normal  VII: facial muscle function - lower Normal  VIII: hearing Not tested  IX: soft palate elevation  Normal  IX,X: gag reflex Present  XI: trapezius strength  5/5  XI: sternocleidomastoid strength 5/5  XI: neck flexion strength  5/5  XII: tongue strength  Normal    Data Review Lab Results  Component Value Date   WBC 9.5 11/15/2021   HGB 8.5 (L) 11/15/2021   HCT 29.8 (L) 11/15/2021   MCV 78.2 (L) 11/15/2021   PLT 337 11/15/2021   Lab Results  Component Value Date   NA 142 11/15/2021   K 3.5 11/15/2021   CL 108 11/15/2021   CO2 28 11/15/2021   BUN 16 11/15/2021   CREATININE 1.39 (H) 11/15/2021   GLUCOSE 106 (H) 11/15/2021   Lab Results  Component Value Date   INR 1.9 (H) 11/15/2021    Radiology: CT HEAD WO CONTRAST (5MM)  Result Date: 11/15/2021 CLINICAL DATA:  Evaluate for intracranial hemorrhage. Status post fall. EXAM: CT HEAD WITHOUT CONTRAST TECHNIQUE: Contiguous axial images were obtained from the base of the skull through the vertex without intravenous contrast. RADIATION DOSE REDUCTION: This exam was performed according to the departmental dose-optimization program which includes automated exposure control, adjustment of the mA and/or kV according to patient size and/or use of iterative reconstruction technique. COMPARISON:   02/04/2020 . FINDINGS: Brain: There is an acute right frontoparietal subdural hematoma measuring up to 6 mm in thickness, image 13/5 and image 14/3. No significant mass effect upon the underlying brain parenchyma. Prominence of the sulci and ventricles compatible with brain atrophy. Remote right basal ganglia lacunar infarct identified. There is moderate patchy low-attenuation within the subcortical and periventricular white matter compatible with chronic microvascular disease. Vascular: No hyperdense vessel or unexpected calcification. Skull: Normal. Negative for fracture or focal lesion. Sinuses/Orbits: Right enucleation with prostatic globe identified. Paranasal sinuses are clear. Mastoid air cells are clear. Other: None. IMPRESSION: 1. Acute right frontoparietal subdural hematoma measuring up to 6 mm in thickness. No significant mass effect upon the underlying brain parenchyma. 2. Chronic small vessel ischemic disease and brain atrophy. 3. Remote right basal ganglia lacunar infarct. Electronically Signed   By: Kerby Moors  M.D.   On: 11/15/2021 07:03     Assessment/Plan: 88 year old [name] presented to the ED after sustaining a fall at home early this morning. CT head shows a small right sided SDH with no mass effect or midline shift. Would stop eliquis for at least a week. No surgical intervention is needed at this point. Admit to medicine overnight with a repeat head CT in the morning. Q2 hour neuro checks. Please call with any neuro changes.    Ocie Cornfield Tyshan Enderle 11/15/2021 10:12 AM

## 2021-11-15 NOTE — ED Triage Notes (Addendum)
Pt bib [facility] from home with complaints of a mechanical fall 30 min prior to arrival. Pt states that he tripped over his pajama pants and thinks that he fell back and hit the back of his head. No injury to head noted. Pt arrives with multiple bandaids on arms and legs, states they are from previous falls. Pt complains of knee pain and back pain but states they are no different from his normal pain. Pt does take xarelto for afib. AOx4 EMS vitals: 190/98, 60 HR, 96% RA, 121 CBG

## 2021-11-15 NOTE — Assessment & Plan Note (Signed)
In sinus rhythm during assessment. - Continue amiodarone '200mg'$  daily - Hold Xarelto for at least 1 week - Maintain cardiac monitors

## 2021-11-15 NOTE — Progress Notes (Signed)
Echocardiogram 2D Echocardiogram has been performed.  [name] Deputy [place] RDCS 11/15/2021, 3:09 PM

## 2021-11-15 NOTE — ED Notes (Signed)
Ambulated to the bathroom unassisted and back, pt wife at the bedside.

## 2021-11-15 NOTE — ED Notes (Signed)
Pt back from CT

## 2021-11-15 NOTE — Assessment & Plan Note (Addendum)
-   UA with reflex to microscopy and culture to rule out infection  - Will need outpatient follow-up with urology for likely cystoscopy

## 2021-11-15 NOTE — Progress Notes (Signed)
Continued headache. Given oxycodone intolerance, will order reglan and norco.   Ezequiel Essex, MD

## 2021-11-16 ENCOUNTER — Observation Stay (HOSPITAL_COMMUNITY): Payer: Medicare PPO

## 2021-11-16 DIAGNOSIS — H5440 Blindness, one eye, unspecified eye: Secondary | ICD-10-CM | POA: Insufficient documentation

## 2021-11-16 DIAGNOSIS — Z7901 Long term (current) use of anticoagulants: Secondary | ICD-10-CM | POA: Diagnosis not present

## 2021-11-16 DIAGNOSIS — W19XXXA Unspecified fall, initial encounter: Secondary | ICD-10-CM | POA: Diagnosis not present

## 2021-11-16 DIAGNOSIS — J3489 Other specified disorders of nose and nasal sinuses: Secondary | ICD-10-CM | POA: Diagnosis not present

## 2021-11-16 DIAGNOSIS — Z9001 Acquired absence of eye: Secondary | ICD-10-CM

## 2021-11-16 DIAGNOSIS — S065XAA Traumatic subdural hemorrhage with loss of consciousness status unknown, initial encounter: Secondary | ICD-10-CM | POA: Diagnosis not present

## 2021-11-16 DIAGNOSIS — S065X0A Traumatic subdural hemorrhage without loss of consciousness, initial encounter: Secondary | ICD-10-CM | POA: Diagnosis not present

## 2021-11-16 LAB — HAPTOGLOBIN: Haptoglobin: 134 mg/dL (ref 38–329)

## 2021-11-16 LAB — CBC
HCT: 30.9 % — ABNORMAL LOW (ref 39.0–52.0)
Hemoglobin: 8.9 g/dL — ABNORMAL LOW (ref 13.0–17.0)
MCH: 22.2 pg — ABNORMAL LOW (ref 26.0–34.0)
MCHC: 28.8 g/dL — ABNORMAL LOW (ref 30.0–36.0)
MCV: 77.1 fL — ABNORMAL LOW (ref 80.0–100.0)
Platelets: 366 10*3/uL (ref 150–400)
RBC: 4.01 MIL/uL — ABNORMAL LOW (ref 4.22–5.81)
RDW: 18.3 % — ABNORMAL HIGH (ref 11.5–15.5)
WBC: 12.4 10*3/uL — ABNORMAL HIGH (ref 4.0–10.5)
nRBC: 0 % (ref 0.0–0.2)

## 2021-11-16 LAB — URINALYSIS, ROUTINE W REFLEX MICROSCOPIC
Bilirubin Urine: NEGATIVE
Glucose, UA: NEGATIVE mg/dL
Hgb urine dipstick: NEGATIVE
Ketones, ur: NEGATIVE mg/dL
Leukocytes,Ua: NEGATIVE
Nitrite: NEGATIVE
Protein, ur: NEGATIVE mg/dL
Specific Gravity, Urine: 1.013 (ref 1.005–1.030)
pH: 6 (ref 5.0–8.0)

## 2021-11-16 LAB — URINE CULTURE: Culture: <10000 — AB

## 2021-11-16 MED ORDER — LIDOCAINE 5 % EX PTCH: 1.0000 | MEDICATED_PATCH | CUTANEOUS | 0 refills | Status: DC

## 2021-11-16 MED ORDER — HYDROCODONE-ACETAMINOPHEN 5-325 MG PO TABS: 1.0000 | ORAL_TABLET | Freq: Four times a day (QID) | ORAL | 0 refills | Status: DC | PRN

## 2021-11-16 MED ORDER — RIVAROXABAN 15 MG PO TABS: 15.0000 mg | ORAL_TABLET | Freq: Every day | ORAL | 1 refills | Status: AC

## 2021-11-16 NOTE — Evaluation (Signed)
Occupational Therapy Evaluation Patient Details Name: Austin [name] MRN: [id number] DOB: 11/14/1930 Today's Date: 11/16/2021   History of Present Illness KEY CEN is a 88 y.o. male s/p mechanical fall hitting head, found small SDH without mass effect. 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 Pt lives with spouse who drives, and supervises med and financial mgmt. Pt currently supervision-mod I for ADLs and mobility at this time. Educated pt and family on use of 3in1 as shower seat for home, pt/spouse verbalized understanding. Encouraged continued supervision from spouse for med mgmt/finances. Pt presenting with impairments listed below, will follow acutely. Recommend d/c home with assistance.      Recommendations for follow up therapy are one component of a multi-disciplinary discharge planning process, led by the attending physician.  Recommendations may be updated based on patient status, additional functional criteria and insurance authorization.   Follow Up Recommendations  No OT follow up    Assistance Recommended at Discharge Set up Supervision/Assistance  Patient can return home with the following Direct supervision/assist for medications management;Direct supervision/assist for financial management;Assist for transportation    Functional Status Assessment  Patient has had a recent decline in their functional status and demonstrates the ability to make significant improvements in function in a reasonable and predictable amount of time.  Equipment Recommendations  BSC/3in1    Recommendations for Other Services       Precautions / Restrictions Precautions Precautions: Fall Precaution Comments: pt without R eye Restrictions Weight Bearing Restrictions: No      Mobility Bed Mobility                General bed mobility comments: OOB in chair upon arrival    Transfers Overall transfer level: Needs assistance Equipment used: None Transfers: Sit to/from Stand Sit to Stand: Supervision                  Balance Overall balance assessment: Mild deficits observed, not formally tested                                         ADL either performed or assessed with clinical judgement   ADL Overall ADL's : Needs assistance/impaired Eating/Feeding: Modified independent   Grooming: Supervision/safety;Wash/dry hands   Upper Body Bathing: Supervision/ safety   Lower Body Bathing: Supervison/ safety   Upper Body Dressing : Modified independent   Lower Body Dressing: Modified independent   Toilet Transfer: Supervision/safety   Toileting- Clothing Manipulation and Hygiene: Supervision/safety       Functional mobility during ADLs: Supervision/safety       Vision Baseline Vision/History: 1 Wears glasses Patient Visual Report: No change from baseline Additional Comments: pt with baseline R eye blindness     Perception     Praxis      Pertinent Vitals/Pain Pain Assessment Pain Assessment: Faces Pain Score: 4  Faces Pain Scale: Hurts little more Pain Location: headache Pain Descriptors / Indicators: Headache Pain Intervention(s): Monitored during session     Hand Dominance Right   Extremity/Trunk Assessment Upper Extremity Assessment Upper Extremity Assessment: Overall WFL for tasks assessed   Lower Extremity Assessment Lower Extremity Assessment: Defer to PT evaluation   Cervical / Trunk Assessment Cervical / Trunk  Assessment: Normal   Communication Communication Communication: No difficulties   Cognition Arousal/Alertness: Awake/alert Behavior During Therapy: WFL for tasks assessed/performed Overall Cognitive Status: Within Functional Limits for tasks assessed                                 General  Comments: able to complete higher level cognitive task, able to remember 2/3 words during 3 word recall after ~38mn     General Comments  VSS on RA, spouse present in room    Exercises     [place] to [place] discharged to:: Private residence Living Arrangements: Spouse/significant other Available Help at Discharge: Family Type of Home: House Home Access: Stairs to enter ETechnical brewerof Steps: 1 Entrance Stairs-Rails: None Home Layout: One level     Bathroom Shower/Tub: TTeacher, early years/pre Handicapped height Bathroom Accessibility: Yes   Home Equipment: [place]- single point;Grab bars - tub/shower          Prior Functioning/Environment Prior Level of Function : Independent/Modified Independent             Mobility Comments: used walking stick occasionally, drives only to bible study 2 blocks away ADLs Comments: indep; does med mgmt with spouse's supervision, wife does most of the driving        OT Problem List: Impaired balance (sitting and/or standing);Decreased activity tolerance      OT Treatment/Interventions: Self-care/ADL training;Therapeutic exercise;Therapeutic activities;Patient/family education;Balance training;DME and/or AE instruction    OT Goals(Current goals can be found in the care plan section) Acute Rehab OT Goals Patient Stated Goal: none stated OT Goal Formulation: With patient Time For Goal Achievement: 11/30/21 Potential to Achieve Goals: Good ADL Goals Additional ADL Goal #1: pt will receive passing score on pillbox assessment for independence in med mgmt  OT Frequency: Min 2X/week    Co-evaluation              AM-PAC OT "6 Clicks" Daily Activity     Outcome Measure Help from another person eating meals?: None Help from another person taking care of personal grooming?: None Help from another person toileting, which includes using toliet, bedpan, or  urinal?: None Help from another person bathing (including washing, rinsing, drying)?: None Help from another person to put on and taking off regular upper body clothing?: None Help from another person to put on and taking off regular lower body clothing?: None 6 Click Score: 24   End of Session Nurse Communication: Mobility status  Activity Tolerance: Patient tolerated treatment well Patient left: in chair;with call bell/phone within reach;with family/visitor present  OT Visit Diagnosis: Unsteadiness on feet (R26.81);Other abnormalities of gait and mobility (R26.89);Muscle weakness (generalized) (M62.81)                Time: 00867-6195OT Time Calculation (min): 19 min Charges:  OT General Charges $OT Visit: 1 Visit OT Evaluation $OT Eval Low Complexity: 1 Low  NLynnda Child OTD, OTR/L Acute Rehab [phone] 8[place]7/08/2021, 10:22 AM

## 2021-11-16 NOTE — Discharge Instructions (Addendum)
Dear Austin [name],  Thank you for letting us participate in your care. You were hospitalized for a fall with head injury and diagnosed with Subdural hematoma ([place]). We observed you over night. Your repeat head scan in the morning showed the blood collection (subdural hematoma) was shrinking. Neurosurgery does not think you need any interventions like surgery at this time.   POST-HOSPITAL & CARE INSTRUCTIONS Follow up with your primary care doctor within a week.  PAUSE your blood thinner (Xarelto) for the next 5-7 days. You may restart Mon 7/10.  Please call outpatient physical therapy to make an appointment.  Your primary care doctor may recommend physical therapy or home modifications to help reduce the number of falls you are having.  Go to your follow up appointments (listed below)  DOCTOR'S APPOINTMENT   Future Appointments  Date Time Provider [place]  03/28/2022 10:00 AM Ward Givens, NP [id number] None  04/19/2022 11:30 AM Sherran Needs, NP [id number] None    Follow-up Information     Burnard Bunting, MD. Schedule an appointment as soon as possible for a visit in 1 week(s).   Specialty: Internal Medicine Why: Make a follow up appointment with your primary care doctor. Should be within one week of discharge from hospital. Contact information: [place] [address] [phone]         Constance Haw, MD .   Specialty: Cardiology Contact information: [address] [place] [address] [place] [address] [phone]         Constance Haw, MD .   Specialty: Cardiology Contact information: [place] [address] [phone]               Take care and be well!  [facility]  [place], [place] [address] [phone]

## 2021-11-16 NOTE — Hospital Course (Addendum)
Austin [name] is a 88 y.o. 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 Hospital course outlined by problem below:  Subdural hematoma Presented after a fall at home when tripping over pants with head trauma. CT head on 7/3 without contrast revealed a 80m right frontoparietal subdural hematoma. Neurosurgery was consulted and recommended overnight observation with repeat CT and q2h neurochecks. Repeat CT on 7/4 showed minimal residual subdural hematoma measuring 210mwithout mass effect or shift. Patient was deemed stable for discharge.  Atrial fibrillation, persistent Remained in normal sinus rhythm throughout admission. Home amiodorane continued. Home Xarelto held given hematoma.  Microcytic IDA  Hgb 8.5 and MCV 78.2 on admission, steadily downtrending over past year. Hemoccult negative. Suspect gross hematuria contributing to anemia. UA without sign of infection.  Hematuria Patient reported months of frank blood in urine. Will need urology follow up.  Heart failure with preserved ejection fraction Euvolemic. Home medications continued.  Essential hypertension BP markedly elevated on admission to 190s/90s. Received PRN hydralazine x1 with improvement. Home medications resumed.  Issues for follow-up: Referral needed for outpatient urology follow-up given degree of hematuria; will likely need cystoscopy Per neurosurgery recommendations, patient is to hold Xarelto for 5-7 days (may resume 7/8-7/10) Discuss home safety equipment to prevent falls (grab bars, etc.)

## 2021-11-16 NOTE — TOC Transition Note (Addendum)
Transition of Care [facility]) - CM/SW Discharge Note   Patient Details  Name: Austin [name] MRN: [id number] Date of Birth: April 27, 1931  Transition of Care [facility]) CM/SW Contact:  Pollie Friar, RN Phone Number: 11/16/2021, 9:38 AM   Clinical Narrative:    Patient is discharging home with outpatient therapy through [facility]. Information on the AVS.  Pt has cane at home and denies any issues with home medications or transportation. 3 in 1 to be delivered to the room per [employer] for home.  Wife providing transport home today.   Final next level of care: OP Rehab Barriers to Discharge: No Barriers Identified   Patient Goals and CMS Choice     Choice offered to / list presented to : Patient, Spouse  Discharge Placement                       Discharge Plan and Services                                     Social Determinants of Health (SDOH) Interventions     Readmission Risk Interventions     No data to display

## 2021-11-16 NOTE — Progress Notes (Signed)
CT scan of the head reviewed as well as the report.  Minimal residual subdural hematoma without mass effect or shift.  Only measures about 2 mm.  We will sign off.  No need for further imaging unless there is some change in status.  Would hold his anticoagulant for 5-7 days and then resume.  Please let us know if we can be of further assistance

## 2021-11-16 NOTE — Care Management Obs Status (Signed)
[place] NOTIFICATION   Patient Details  Name: Austin [name] MRN: [id number] Date of Birth: 1930-12-23   Medicare Observation Status Notification Given:  Yes    Pollie Friar, RN 11/16/2021, 9:36 AM

## 2021-11-16 NOTE — Evaluation (Signed)
Physical Therapy Evaluation Patient Details Name: Austin [name] MRN: [id number] DOB: 21-Mar-1931 Today's Date: 11/16/2021  History of Present Illness  Austin [name] is a 88 y.o. male s/p mechanical fall hitting head, found small SDH without mass effect. 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 Pt at increased fall risk as demo'd by 14/24 on DGI.  When asked if he feels back to normal pt stated "No not quite. 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 Recommend outpt to address balance deficits. Wife and pt agree. Acute PT to cont to follow.     Recommendations for follow up therapy are one component of a multi-disciplinary discharge planning process, led by the attending physician.  Recommendations may be updated based on patient status, additional functional criteria and insurance authorization.  Follow Up Recommendations Outpatient PT      Assistance Recommended at Discharge Frequent or constant Supervision/Assistance  Patient can return home with the following       Equipment Recommendations None recommended by PT  Recommendations for Other Services       Functional Status Assessment Patient has had a recent decline in their functional status and demonstrates the ability to make significant improvements in function in a reasonable and predictable amount of time.     Precautions / Restrictions Precautions Precautions: Fall Precaution Comments: pt without R eye Restrictions Weight Bearing Restrictions: No      Mobility  Bed Mobility               General bed mobility comments: pt up in chair upon PT arrival    Transfers Overall transfer level: Needs assistance Equipment used: None Transfers: Sit to/from Stand Sit to Stand: Supervision           General transfer comment: pt pushed up with arms rests    Ambulation/Gait Ambulation/Gait  assistance: Min guard Gait Distance (Feet): 250 Feet Assistive device: None Gait Pattern/deviations: Step-through pattern, Decreased stride length Gait velocity: dec Gait velocity interpretation: 1.31 - 2.62 ft/sec, indicative of limited community ambulator   General Gait Details: mildly unsteady, no overt LOB, most difficulty with looking up at ceiling  Stairs Stairs: Yes Stairs assistance: Min assist Stair Management: No rails, Step to pattern, Forwards Number of Stairs: 2 General stair comments: pt needed railings for safety even though pt doesn't have one at home  Wheelchair Mobility    Modified Rankin (Stroke Patients Only) Modified Rankin (Stroke Patients Only) Pre-Morbid Rankin Score: Slight disability Modified Rankin: Slight disability     Balance                                 Standardized Balance Assessment Standardized Balance Assessment : Dynamic Gait Index   Dynamic Gait Index Level Surface: Mild Impairment Change in Gait Speed: Moderate Impairment Gait with Horizontal Head Turns: Mild Impairment Gait with Vertical Head Turns: Moderate Impairment Gait and Pivot Turn: Mild Impairment Step Over Obstacle: Mild Impairment Step Around Obstacles: Mild Impairment Steps: Mild Impairment Total Score: 14       Pertinent Vitals/Pain Pain Assessment Pain Assessment: 0-10 Pain Score: 4  Pain Location: headache Pain Descriptors / Indicators: Headache Pain Intervention(s): Monitored during session    Home Living Family/patient expects to be discharged to:: Private residence Living Arrangements: Spouse/significant other Available Help at  Discharge: Family Type of Home: House Home Access: Stairs to enter Entrance Stairs-Rails: None Entrance Stairs-Number of Steps: 1   Home Layout: One level Home Equipment: Cane - single point;Grab bars - tub/shower      Prior Function Prior Level of Function : Independent/Modified Independent              Mobility Comments: used walking stick occasionally, drives only to [employer]ible study 2 blocks away ADLs Comments: indep     Hand Dominance   Dominant Hand: Right    Extremity/Trunk Assessment                Communication   Communication: No difficulties  Cognition Arousal/Alertness: Awake/alert Behavior During Therapy: WFL for tasks assessed/performed Overall Cognitive Status: Within Functional Limits for tasks assessed                                          General Comments General comments (skin integrity, edema, etc.): pt with R knee laceration    Exercises     Assessment/Plan    PT Assessment Patient needs continued PT services  PT Problem List Decreased strength;Decreased activity tolerance;Decreased balance;Decreased mobility;Decreased coordination       PT Treatment Interventions DME instruction;Gait training;Stair training;Functional mobility training;Therapeutic activities;Therapeutic exercise;Balance training    PT Goals (Current goals can be found in the Care Plan section)  Acute Rehab PT Goals Patient Stated Goal: home asap PT Goal Formulation: With patient Time For Goal Achievement: 11/30/21 Potential to Achieve Goals: Good    Frequency Min 3X/week     Co-evaluation               AM-PAC PT "6 Clicks" Mobility  Outcome Measure Help needed turning from your back to your side while in a flat bed without using bedrails?: None Help needed moving from lying on your back to sitting on the side of a flat bed without using bedrails?: None Help needed moving to and from a bed to a chair (including a wheelchair)?: None Help needed standing up from a chair using your arms (e.g., wheelchair or bedside chair)?: None Help needed to walk in hospital room?: A Little Help needed climbing 3-5 steps with a railing? : A Little 6 Click Score: 22    End of Session Equipment Utilized During Treatment: Gait belt Activity Tolerance: Patient  tolerated treatment well Patient left: in chair;with call bell/phone within reach;with family/visitor present Nurse Communication: Mobility status PT Visit Diagnosis: Unsteadiness on feet (R26.81)    Time: 2595-6387 PT Time Calculation (min) (ACUTE ONLY): 22 min   Charges:   PT Evaluation $PT Eval Low Complexity: 1 Low          Kittie Plater, PT, DPT Acute Rehabilitation Services Secure chat preferred Office #: [phone]   Berline Lopes 11/16/2021, 9:00 AM

## 2021-11-16 NOTE — Discharge Summary (Cosign Needed Addendum)
[facility] Discharge Summary  Patient name: Austin [name] Medical record number: [id number] Date of birth: 10/06/1930 Age: 88 y.o. Gender: male Date of Admission: 11/15/2021  Date of Discharge: 11/16/2021 Admitting Physician: Eppie Gibson, MD  Primary Care Provider: Burnard Bunting, MD Consultants: Neurosurgery  Indication for Hospitalization: fall  Brief Hospital Course:  Austin [name] is a 88 y.o. 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 Hospital course outlined by problem below:  Subdural hematoma Presented after a fall at home when tripping over pants with head trauma. CT head on 7/3 without contrast revealed a 63m right frontoparietal subdural hematoma. Neurosurgery was consulted and recommended overnight observation with repeat CT and q2h neurochecks. Repeat CT on 7/4 showed minimal residual subdural hematoma measuring 27mwithout mass effect or shift. Patient was deemed stable for discharge.  Atrial fibrillation, persistent Remained in normal sinus rhythm throughout admission. Home amiodorane continued. Home Xarelto held given hematoma.  Microcytic IDA  Hgb 8.5 and MCV 78.2 on admission, steadily downtrending over past year. Hemoccult negative. Suspect gross hematuria contributing to anemia. UA without sign of infection.  Hematuria Patient reported months of frank blood in urine. Will need urology follow up.  Heart failure with preserved ejection fraction Euvolemic. Home medications continued.  Essential hypertension BP markedly elevated on admission to 190s/90s. Received PRN hydralazine x1 with improvement. Home medications resumed.  Issues for follow-up: Referral needed for outpatient urology follow-up given degree of hematuria; will likely need cystoscopy Per neurosurgery recommendations,  patient is to hold Xarelto for 5-7 days (may resume 7/8-7/10) Discuss home safety equipment to prevent falls (grab bars, etc.)  Discharge Diagnoses/Problem List:  Principal Problem for Admission: fall, subdural hematoma Other Problems addressed during stay:  Atrial fibrillation Hematuria Anemia Hypertension    Disposition: Home  Discharge Condition: Stable  Discharge Exam:   General: Well appearing male sitting in hospital chair Neuro: A&Ox4, No gross deficits Cardiovascular: RRR, no murmurs Respiratory: Normal WOB on RA, CTAB Abdomen: soft non tender, non distended Extremities: Radial pulse intact b/l   Significant Procedures: none  Significant Labs and Imaging:  Recent Labs  Lab 11/15/21 0753 11/16/21 0031  WBC 9.5 12.4*  HGB 8.5* 8.9*  HCT 29.8* 30.9*  PLT 337 366   Recent Labs  Lab 11/15/21 0753  NA 142  K 3.5  CL 108  CO2 28  GLUCOSE 106*  BUN 16  CREATININE 1.39*  CALCIUM 8.5*      Results/Tests Pending at Time of Discharge: none  Discharge Medications:  Allergies as of 11/16/2021       Reactions   Bee Venom Anaphylaxis   Codeine Nausea And Vomiting, Itching   Iodine    Other reaction(s): Swollen Tongue   Oxycodone Nausea And Vomiting   Penicillins Swelling, Rash   Has patient had a PCN reaction causing immediate rash, facial/tongue/throat swelling, SOB or lightheadedness with hypotension: Yes Has patient had a PCN reaction causing severe rash involving mucus membranes or skin necrosis: No Has patient had a PCN reaction that required hospitalization No Has patient had a PCN reaction occurring within the last 10 years: No If all of the above answers are "NO", then may proceed with Cephalosporin use.   Benadryl [diphenhydramine]    Per cards, had episode of flipping into A. Fib after benadryl   Diltiazem Other (See  Comments)   Severe headaches 120 mg BID   Diatrizoate Rash   Ivp Dye [iodinated Contrast Media] Rash   Tape Itching, Rash    Redness.  Please use "paper" tape only   Yohimbine    Unknown        Medication List     STOP taking these medications    Butalbital-APAP-Caff-Cod 50-300-40-30 MG Caps   zolpidem 10 MG tablet Commonly known as: AMBIEN       TAKE these medications    albuterol 108 (90 Base) MCG/ACT inhaler Commonly known as: VENTOLIN HFA Inhale 2 puffs into the lungs every 4 (four) hours as needed for wheezing or shortness of breath (cough).   amiodarone 200 MG tablet Commonly known as: PACERONE TAKE 1 TABLET BY MOUTH EVERY DAY   amLODipine 2.5 MG tablet Commonly known as: NORVASC TAKE 1 TABLET BY MOUTH EVERY DAY   budesonide-formoterol 80-4.5 MCG/ACT inhaler Commonly known as: SYMBICORT Inhale 2 puffs into the lungs 2 (two) times daily.   carvedilol 6.25 MG tablet Commonly known as: COREG Take 1 tablet (6.25 mg total) by mouth 2 (two) times daily.   EPINEPHrine 0.3 mg/0.3 mL Soaj injection Commonly known as: EPI-PEN Inject 0.3 mg into the muscle as needed for anaphylaxis.   furosemide 20 MG tablet Commonly known as: LASIX TAKE 1 TABLET BY MOUTH EVERY DAY   HYDROcodone-acetaminophen 5-325 MG tablet Commonly known as: NORCO/VICODIN Take 1 tablet by mouth every 6 (six) hours as needed for severe pain (HEADACHE).   isosorbide mononitrate 60 MG 24 hr tablet Commonly known as: IMDUR TAKE 1 TABLET BY MOUTH EVERY DAY   Klor-Con M20 20 MEQ tablet Generic drug: potassium chloride SA TAKE 1 TABLET BY MOUTH EVERY DAY   lidocaine 5 % Commonly known as: LIDODERM Place 1 patch onto the skin daily. Remove & Discard patch within 12 hours or as directed by MD Start taking on: November 17, 2021   MAGNESIUM CITRATE PO Take 250 mg by mouth at bedtime.   Nasacort Allergy 24HR 55 MCG/ACT Aero nasal inhaler Generic drug: triamcinolone Place 2 sprays into the nose daily.   olmesartan 40 MG tablet Commonly known as: BENICAR TAKE 1 TABLET EVERY DAY **REPLACES LOSARTAN** What changed: See  the new instructions.   Rivaroxaban 15 MG Tabs tablet Commonly known as: Xarelto Take 1 tablet (15 mg total) by mouth daily with supper. DO NOT RESTART until 11/22/2021 Start taking on: November 22, 2021 What changed:  See the new instructions. These instructions start on November 22, 2021. If you are unsure what to do until then, ask your doctor or other care provider.   sertraline 50 MG tablet Commonly known as: ZOLOFT Take 50 mg by mouth at bedtime.   valACYclovir 1000 MG tablet Commonly known as: VALTREX Take 1,000 mg by mouth at bedtime.               Durable Medical Equipment  (From admission, onward)           Start     Ordered   11/16/21 1016  For home use only DME 3 n 1  Once        11/16/21 1015            Discharge Instructions: Please refer to Patient Instructions section of EMR for full details.  Patient was counseled important signs and symptoms that should prompt return to medical care, changes in medications, dietary instructions, activity restrictions, and follow up appointments.   Follow-Up Appointments:  Follow-up Information     Burnard Bunting, MD. Schedule an appointment as soon as possible for a visit in 1 week(s).   Specialty: Internal Medicine Why: Make a follow up appointment with your primary care doctor. Should be within one week of discharge from hospital. Contact information: [place] [address] [phone]         Constance Haw, MD .   Specialty: Cardiology Contact information: [place] [address] [phone]         Constance Haw, MD .   Specialty: Cardiology Contact information: [address] [place] [address] [phone]         [facility] Schedule an appointment as soon as possible for a visit in 1 week(s).   Specialty: Rehabilitation Contact information: [place] [address] [place], [place]  [place] [id number] mc [place] [phone]                Salvadore Oxford, MD 11/16/2021, 12:25 PM PGY-1, [facility]  Upper Level Addendum: I have seen and evaluated this patient along with Dr. Ruben Im and reviewed the above note, making necessary revisions as appropriate. I agree with the medical decision making and physical exam as noted above.  Orvis Brill, DO 11/16/2021, 4:39 PM PGY-2, [facility]

## 2021-11-16 NOTE — Progress Notes (Signed)
Pt requires a 3 in1 as is it difficult for the patient to get up and down from the toilet. The handles and elevated seat will assist with this.

## 2021-11-21 NOTE — Progress Notes (Unsigned)
Cardiology Office Note Date:  11/21/2021  Patient ID:  Austin, [name] 12/13/1930, MRN [id number] PCP:  Burnard Bunting, MD  Electrophysiologist: Dr. Curt Bears  ***refresh   Chief Complaint: *** post hospital  History of Present Illness: Austin [name] is a 88 y.o. male with history of melanoma R eye lid > loss of vision, COPD, sleep apnea, HTN, AFib, CKD (II)  He comes in today to be seen for Dr. Curt Bears, last seen by him 10/14/21, reported fatigue, felt he had been in normal rhythm, did not seem to connect his fatigue to the AF. Maintaining SR, coreg reduced to see if this would help his fatigue, if not could try Toprol.  Hospitalized 11/15/21 - 11/16/21 after a mechanical trip/fall and found with SDH, neurosurgery involved, f/u scan the following day with improvement and stable to discharge Recs to hold xarelto until 7/8 or 11/22/21. Also mentioned some degree of hematuria to follow up with urology, anemic with a steady trend downwards, negative stool, no UTI.   *** back on Xarelto? *** bleeding, dose, neuro changes, pain? *** dose *** burden *** needs amio labs *** ANEMIC...  AFib/AAD Hx Diagnosed 2017 Failed flecainide and amiodarone (notes also report corneal deposits) Tiksoyn started March 2021 >>> Stopped Sept 2022 with recurrent AFib/flutter Multaq started though stopped quickly with new onset SOB Saw his eye MD and cleared to retry amiodarone, restarted  Jan 2023   Past Medical History:  Diagnosis Date   Allergy    Asthma    BPH (benign prostatic hypertrophy)    CKD (chronic kidney disease) stage 3, GFR 30-59 ml/min (HCC)    Cough    Gout    Hypertensive chronic kidney disease    Melanoma in situ of right eyelid ([place])    OSA (obstructive sleep apnea)    Persistent atrial fibrillation ([place])     Past Surgical History:  Procedure Laterality Date   CARDIOVERSION [name] 06/05/2015   Procedure: CARDIOVERSION;  Surgeon: Sanda Klein, MD;  Location: [place];  Service: Cardiovascular;  Laterality: [name];   CARDIOVERSION [name] 01/04/2021   Procedure: CARDIOVERSION;  Surgeon: [name], MD;  Location: [place];  Service: Cardiovascular;  Laterality: [name];   CARDIOVERSION [name] 02/08/2021   Procedure: CARDIOVERSION;  Surgeon: Jerline Pain, MD;  Location: [place];  Service: Cardiovascular;  Laterality: [name];   CARDIOVERSION [name] 04/14/2021   Procedure: CARDIOVERSION;  Surgeon: Freada Bergeron, MD;  Location: [place];  Service: Cardiovascular;  Laterality: [name];   CARDIOVERSION [name] 07/05/2021   Procedure: CARDIOVERSION;  Surgeon: Fay Records, MD;  Location: [place];  Service: Cardiovascular;  Laterality: [name];   EYE SURGERY  12/2017    Current Outpatient Medications  Medication Sig Dispense Refill   albuterol (VENTOLIN HFA) 108 (90 Base) MCG/ACT inhaler Inhale 2 puffs into the lungs every 4 (four) hours as needed for wheezing or shortness of breath (cough).     amiodarone (PACERONE) 200 MG tablet TAKE 1 TABLET BY MOUTH EVERY DAY 90 tablet 3   amLODipine (NORVASC) 2.5 MG tablet TAKE 1 TABLET BY MOUTH EVERY DAY 90 tablet 1   budesonide-formoterol (SYMBICORT) 80-4.5 MCG/ACT inhaler Inhale 2 puffs into the lungs 2 (two) times daily.      carvedilol (COREG) 6.25 MG tablet Take 1 tablet (6.25 mg total) by mouth 2 (two) times daily. 180 tablet 1   EPINEPHrine 0.3 mg/0.3 mL IJ SOAJ injection Inject 0.3 mg into the muscle as needed for anaphylaxis. 1 each 0  furosemide (LASIX) 20 MG tablet TAKE 1 TABLET BY MOUTH EVERY DAY 90 tablet 1   HYDROcodone-acetaminophen (NORCO/VICODIN) 5-325 MG tablet Take 1 tablet by mouth every 6 (six) hours as needed for severe pain (HEADACHE). 5 tablet 0   isosorbide mononitrate (IMDUR) 60 MG 24 hr tablet TAKE 1 TABLET BY MOUTH EVERY DAY 90 tablet 2   KLOR-CON M20 20 MEQ tablet TAKE 1 TABLET BY MOUTH EVERY DAY 90 tablet 3   lidocaine (LIDODERM) 5 % Place 1 patch onto the skin daily. Remove &  Discard patch within 12 hours or as directed by MD 30 patch 0   MAGNESIUM CITRATE PO Take 250 mg by mouth at bedtime.     olmesartan (BENICAR) 40 MG tablet TAKE 1 TABLET EVERY DAY **REPLACES LOSARTAN** (Patient taking differently: Take 40 mg by mouth daily.) 90 tablet 2   [START ON 11/22/2021] Rivaroxaban (XARELTO) 15 MG TABS tablet Take 1 tablet (15 mg total) by mouth daily with supper. DO NOT RESTART until 11/22/2021 90 tablet 1   sertraline (ZOLOFT) 50 MG tablet Take 50 mg by mouth at bedtime.     triamcinolone (NASACORT ALLERGY 24HR) 55 MCG/ACT AERO nasal inhaler Place 2 sprays into the nose daily.     valACYclovir (VALTREX) 1000 MG tablet Take 1,000 mg by mouth at bedtime.     No current facility-administered medications for this visit.    Allergies:   Bee venom, Codeine, Iodine, Oxycodone, Penicillins, Benadryl [diphenhydramine], Diltiazem, Diatrizoate, Ivp dye [iodinated contrast media], Tape, and Yohimbine   Social History:  The patient  reports that he has never smoked. He has never used smokeless tobacco. He reports current alcohol use of about 1.0 standard drink of alcohol per week. He reports that he does not use drugs.   Family History:  The patient's family history includes Hypertension in his father and mother; Lung cancer in his father.  ROS:  Please see the history of present illness.    All other systems are reviewed and otherwise negative.   PHYSICAL EXAM:  VS:  There were no vitals taken for this visit. BMI: There is no height or weight on file to calculate BMI. Well nourished, well developed, in no acute distress HEENT: normocephalic, atraumatic Neck: no JVD, carotid bruits or masses Cardiac:  *** RRR; no significant murmurs, no rubs, or gallops Lungs:  *** CTA b/l, no wheezing, rhonchi or rales Abd: soft, nontender MS: no deformity or *** atrophy Ext: *** no edema Skin: warm and dry, no rash Neuro:  No gross deficits appreciated Psych: euthymic mood, full  affect   EKG:  not done today   11/15/21: TTE 1. Compared to 10/09/19 aortic stenosis is slightly worse.   2. Left ventricular ejection fraction, by estimation, is 60 to 65%. The  left ventricle has normal function. The left ventricle has no regional  wall motion abnormalities. There is mild left ventricular hypertrophy.  Left ventricular diastolic parameters  are consistent with Grade II diastolic dysfunction (pseudonormalization).  Elevated left atrial pressure.   3. Right ventricular systolic function is normal. The right ventricular  size is normal.   4. Left atrial size was severely dilated.   5. The mitral valve is normal in structure. Mild mitral valve  regurgitation. No evidence of mitral stenosis.   6. The aortic valve is calcified. Aortic valve regurgitation is mild.  Mild to moderate aortic valve stenosis.   7. Aortic dilatation noted. There is borderline dilatation of the aortic  root, measuring 38 mm.  8. The inferior vena cava is normal in size with greater than 50%  respiratory variability, suggesting right atrial pressure of 3 mmHg.   06/29/2015: stress myoview Nuclear stress EF: 55%. There was no ST segment deviation noted during stress. Patient was in atrial fibrillation throughout the study. There is a small defect of mild severity present in the basal inferolateral and mid inferolateral location. The defect is non-reversible. There is a small defect of mild severity present in the basal inferior and mid inferior location. The defect is non-reversible.There is a small defect of mild severity present in the apex location. The defect is non-reversible. No ischemia noted. This is a low risk study. The left ventricular ejection fraction is normal (55-65%).    Recent Labs: 12/30/2020: Magnesium 2.2 07/05/2021: ALT 13; TSH 2.625 11/15/2021: BUN 16; Creatinine, Ser 1.39; Potassium 3.5; Sodium 142 11/16/2021: Hemoglobin 8.9; Platelets 366  No results found for requested labs  within last 365 days.   Estimated Creatinine Clearance: 35.3 mL/min (A) (by C-G formula based on SCr of 1.39 mg/dL (H)).   Wt Readings from Last 3 Encounters:  11/15/21 150 lb (68 kg)  10/14/21 156 lb 6.4 oz (70.9 kg)  07/14/21 157 lb 3.2 oz (71.3 kg)     Other studies reviewed: Additional studies/records reviewed today include: summarized above  ASSESSMENT AND PLAN:  Persistent AFib CHA2DS2Vasc is 3, on *** Xarelto, appropriately dosed *** burden by symptoms on amiodarone *** labs *** traumatic SDH *** anemia, hematuria  HTN ***  Disposition: F/u with ***  Current medicines are reviewed at length with the patient today.  The patient did not have any concerns regarding medicines.  Venetia Night, PA-C 11/21/2021 12:14 PM     [address] [place] [address] [phone] (office)  [phone] (fax)

## 2021-11-23 ENCOUNTER — Encounter: Payer: Self-pay | Admitting: Physician Assistant

## 2021-11-23 ENCOUNTER — Ambulatory Visit: Payer: Medicare PPO | Admitting: Physician Assistant

## 2021-11-23 VITALS — BP 122/78 | HR 55 | Ht 68.0 in | Wt 158.8 lb

## 2021-11-23 DIAGNOSIS — I4819 Other persistent atrial fibrillation: Secondary | ICD-10-CM

## 2021-11-23 DIAGNOSIS — Z79899 Other long term (current) drug therapy: Secondary | ICD-10-CM | POA: Diagnosis not present

## 2021-11-23 DIAGNOSIS — R296 Repeated falls: Secondary | ICD-10-CM

## 2021-11-23 DIAGNOSIS — I10 Essential (primary) hypertension: Secondary | ICD-10-CM | POA: Diagnosis not present

## 2021-11-23 DIAGNOSIS — W19XXXA Unspecified fall, initial encounter: Secondary | ICD-10-CM | POA: Diagnosis not present

## 2021-11-23 NOTE — Patient Instructions (Signed)
Medication Instructions:   Your physician recommends that you continue on your current medications as directed. Please refer to the Current Medication list given to you today.   *If you need a refill on your cardiac medications before your next appointment, please call your pharmacy*   Lab Work:  TODAY!!! LFT/TSH  If you have labs (blood work) drawn today and your tests are completely normal, you will receive your results only by: [place] (if you have MyChart) OR A paper copy in the mail If you have any lab test that is abnormal or we need to change your treatment, we will call you to review the results.   Testing/Procedures:  None ordered.   Follow-Up: At [facility], you and your health needs are our priority.  As part of our continuing mission to provide you with exceptional heart care, we have created designated Provider Care Teams.  These Care Teams include your primary Cardiologist (physician) and Advanced Practice Providers (APPs -  Physician Assistants and Nurse Practitioners) who all work together to provide you with the care you need, when you need it.  We recommend signing up for the patient portal called "MyChart".  Sign up information is provided on this After Visit Summary.  MyChart is used to connect with patients for Virtual Visits (Telemedicine).  Patients are able to view lab/test results, encounter notes, upcoming appointments, etc.  Non-urgent messages can be sent to your provider as well.   To learn more about what you can do with MyChart, go to [email or web address].    Your next appointment:   6 week(s)  The format for your next appointment:   In Person  Provider:   Tommye Standard, PA-C   Important Information About Sugar

## 2021-11-24 LAB — HEPATIC FUNCTION PANEL
ALT: 12 IU/L (ref 0–44)
AST: 15 IU/L (ref 0–40)
Albumin: 3.9 g/dL (ref 3.7–4.7)
Alkaline Phosphatase: 99 IU/L (ref 44–121)
Bilirubin Total: 0.3 mg/dL (ref 0.0–1.2)
Bilirubin, Direct: 0.14 mg/dL (ref 0.00–0.40)
Total Protein: 6.5 g/dL (ref 6.0–8.5)

## 2021-11-24 LAB — TSH: TSH: 2.22 u[IU]/mL (ref 0.450–4.500)

## 2021-11-25 ENCOUNTER — Ambulatory Visit: Payer: Medicare PPO | Attending: Family Medicine

## 2021-11-25 DIAGNOSIS — M6281 Muscle weakness (generalized): Secondary | ICD-10-CM | POA: Insufficient documentation

## 2021-11-25 DIAGNOSIS — R262 Difficulty in walking, not elsewhere classified: Secondary | ICD-10-CM | POA: Insufficient documentation

## 2021-11-25 DIAGNOSIS — R2681 Unsteadiness on feet: Secondary | ICD-10-CM | POA: Insufficient documentation

## 2021-11-25 DIAGNOSIS — S065XAA Traumatic subdural hemorrhage with loss of consciousness status unknown, initial encounter: Secondary | ICD-10-CM | POA: Diagnosis not present

## 2021-11-25 NOTE — Therapy (Signed)
OUTPATIENT PHYSICAL THERAPY NEURO EVALUATION   Patient Name: Austin [name] MRN: [id number] DOB:Jun 30, 1930, 88 y.o., male Today's Date: 11/25/2021   PCP: Burnard Bunting REFERRING PROVIDER: Lenoria Chime, MD    PT End of Session - 11/25/21 [phone]     Visit Number 1    Number of Visits 8    Date for PT Re-Evaluation 12/23/21    Authorization Type [employer]    PT Start Time 0935    PT Stop Time 1015    PT Time Calculation (min) 40 min    Equipment Utilized During Treatment Gait belt    Activity Tolerance Patient tolerated treatment well             Past Medical History:  Diagnosis Date   Allergy    Asthma    BPH (benign prostatic hypertrophy)    CKD (chronic kidney disease) stage 3, GFR 30-59 ml/min (HCC)    Cough    Gout    Hypertensive chronic kidney disease    Melanoma in situ of right eyelid ([place])    OSA (obstructive sleep apnea)    Persistent atrial fibrillation ([place])    Past Surgical History:  Procedure Laterality Date   CARDIOVERSION [name] 06/05/2015   Procedure: CARDIOVERSION;  Surgeon: Sanda Klein, MD;  Location: [facility];  Service: Cardiovascular;  Laterality: [name];   CARDIOVERSION [name] 01/04/2021   Procedure: CARDIOVERSION;  Surgeon: [name], MD;  Location: [facility] ENDOSCOPY;  Service: Cardiovascular;  Laterality: [name];   CARDIOVERSION [name] 02/08/2021   Procedure: CARDIOVERSION;  Surgeon: Jerline Pain, MD;  Location: [facility];  Service: Cardiovascular;  Laterality: [name];   CARDIOVERSION [name] 04/14/2021   Procedure: CARDIOVERSION;  Surgeon: Freada Bergeron, MD;  Location: [facility] ENDOSCOPY;  Service: Cardiovascular;  Laterality: [name];   CARDIOVERSION [name] 07/05/2021   Procedure: CARDIOVERSION;  Surgeon: Fay Records, MD;  Location: [facility] ENDOSCOPY;  Service: Cardiovascular;  Laterality: [name];   EYE SURGERY  12/2017   Patient Active Problem List   Diagnosis Date Noted   Blind right eye 11/16/2021   Absent eyeball 11/16/2021   Subdural  hematoma ([place]) 11/15/2021   (HFpEF) heart failure with preserved ejection fraction ([place]) 11/15/2021   Anemia 11/15/2021   Hematuria 11/15/2021   Dyspnea on exertion 09/19/2019   Atrial fibrillation and flutter ([place]) 03/08/2018   OSA on CPAP 03/08/2018   Atrial fibrillation, persistent ([place]) 08/10/2015   Atrial fibrillation ([place]) 07/29/2015   CPAP use counseling 07/29/2015   OSA and COPD overlap syndrome ([place]) 07/29/2015   Essential hypertension 06/22/2015   CKD (chronic kidney disease) 06/22/2015   Persistent atrial fibrillation ([place])    Gasping for breath 03/19/2015   Snoring 03/19/2015   Asthma, mild persistent 03/19/2015   COPD exacerbation ([place]) 03/19/2015   FUO (fever of unknown origin) 03/19/2013   Melanoma in situ of right eyelid ([place])    Neurotrophic keratoconjunctivitis 06/01/2011   Malignant neoplasm of conjunctiva ([place]) 03/02/2011   Trichiasis of eyelid 03/02/2011    ONSET DATE: 11/15/21  REFERRING DIAG: H88.5XAA (ICD-10-CM) - Subdural hematoma (HCC)   THERAPY DIAG:  Unsteadiness on feet  Muscle weakness (generalized)  Difficulty in walking, not elsewhere classified  Rationale for Evaluation and Treatment Rehabilitation  SUBJECTIVE:  SUBJECTIVE STATEMENT: Pt experienced a fall at 4 AM on 11/15/21 where think he got tangled in pajama bottoms and struck back of head. Denies any HA, ringing in ear, vertigo. Denies any new walking or balance deficits. Patient has had several falls in the past few months. Pt notes a lot of his physical limitations are likely from A-fib where he feels rapid onset of fatigue/SOB Pt accompanied by: significant other  PERTINENT HISTORY: history of A fib on Xarelto, HFpEF, Asthma, CKD 3, and melanoma of the R eye s/p enucleation who presented with fall at  home and subdural hematoma without mass effec   PAIN:  Are you having pain? No  PRECAUTIONS: None  WEIGHT BEARING RESTRICTIONS No  FALLS: Has patient fallen in last 6 months? Yes. Number of falls 3-5  LIVING ENVIRONMENT: Lives with: lives with their spouse Lives in: House/apartment Stairs: Yes: External: 1-2 steps; none Has following equipment at home:  walking stick, cane  PLOF: Independent and Independent with basic ADLs  PATIENT GOALS improve balance, be able to walk up steps while carrying things   OBJECTIVE:   DIAGNOSTIC FINDINGS: no mass effect  COGNITION: Overall cognitive status: Impaired, short-term memory    SENSATION: WFL  COORDINATION: WFL  EDEMA:    MUSCLE TONE: WNL    POSTURE: increased thoracic kyphosis and scoliosis?  LOWER EXTREMITY ROM:     WFL   LOWER EXTREMITY MMT:    3+/5 gross BLE strength for resisted tests in sitting  BED MOBILITY:  independent  TRANSFERS: Assistive device utilized:  BUE   Sit to stand: Modified independence Stand to sit: Modified independence Chair to chair: Modified independence Floor:  DNT   STAIRS:  Level of Assistance: Modified independence  Stair Negotiation Technique: Step to Pattern Alternating Pattern  with Bilateral Rails  Number of Stairs: 10   Height of Stairs: 4-6"  Comments:   GAIT: Gait pattern: WFL Distance walked:  Assistive device utilized: Single point cane Level of assistance: Modified independence Comments: deficits during turns   FUNCTIONAL TESTs:  5 times sit to stand: 17.91 with retro-LOB and pushing knees into mat table Timed up and go (TUG): 15.75 sec with walking stick; 14.19 sec without AD Berg Balance Scale: 43/45 indicating high risk for falls  PATIENT SURVEYS:    TODAY'S TREATMENT:  Assessment; HEP initiated   PATIENT EDUCATION: Education details: assessment findings, rationale of PT intervention Person educated: Patient and Spouse Education method:  Explanation Education comprehension: verbalized understanding   HOME EXERCISE PROGRAM: Access Code: [email or web address] URL: [email or web address] Date: 11/25/2021 Prepared by: Sherlyn Lees  Exercises - Sit to Stand with Arms Crossed  - 1 x daily - 7 x weekly - 3 sets - 5 reps - Standing Tandem Balance with Counter Support  - 1 x daily - 7 x weekly - 3 sets - 15-30 sec hold    GOALS: Goals reviewed with patient? Yes  SHORT TERM GOALS: Target date: 12/09/2021  Patient will be independent in HEP to improve functional outcomes Baseline: Goal status: INITIAL    LONG TERM GOALS: Target date: 12/23/2021  Demonstrate improved BLE strength and balance as evidenced by time of <15 sec 5xSTS test to reduce risk for falls Baseline: 17.91 18" seat height, several LOB, compensation with legs against table Goal status: INITIAL  2.  Manifest low risk for falls per score 50/56 Berg Balance Test to improve safety with mobility and ADL Baseline: 43/56 Goal status: INITIAL  3.  Reduce risk for falls per TUG test  12 sec Baseline: 14-17 sec Goal status: INITIAL  4.  Demonstrate improved safety, balance, strength, and activity tolerance as evidenced by ability to carry heavy item up/down 12 steps to improve safety with access to beach house Baseline: unable Goal status: INITIAL    ASSESSMENT:  CLINICAL IMPRESSION: Patient is a 88 y.o. man who was seen today for physical therapy evaluation and treatment for unsteadiness/hx of falling.  Demonstrates high risk for falls per tests and measures assessing fall risk and balance deficits and demonstrates generalized LE weakness and reduced activity tolerance impacting his functional mobility and ability to walk up/down stairs while carrying items.  Pt would benefit from PT intervention to address these deficits and limitations to reduce risk for falls and facilitate greater activity participation   OBJECTIVE IMPAIRMENTS cardiopulmonary status  limiting activity, decreased activity tolerance, decreased balance, decreased cognition, decreased endurance, decreased mobility, difficulty walking, decreased strength, and postural dysfunction.   ACTIVITY LIMITATIONS carrying, lifting, standing, squatting, stairs, transfers, and locomotion level  PARTICIPATION LIMITATIONS: meal prep, cleaning, laundry, community activity, and access to vacation home  PERSONAL FACTORS Age, Time since onset of injury/illness/exacerbation, and 1-2 comorbidities: A-fib, visual impairment  are also affecting patient's functional outcome.   REHAB POTENTIAL: Good  CLINICAL DECISION MAKING: Evolving/moderate complexity  EVALUATION COMPLEXITY: Moderate  PLAN: PT FREQUENCY: 1-2x/week  PT DURATION: 4 weeks  PLANNED INTERVENTIONS: Therapeutic exercises, Therapeutic activity, Neuromuscular re-education, Balance training, Gait training, Patient/Family education, Self Care, Joint mobilization, Stair training, Vestibular training, Canalith repositioning, Orthotic/Fit training, DME instructions, Aquatic Therapy, Dry Needling, Cryotherapy, Moist heat, and Manual therapy  PLAN FOR NEXT SESSION: continue with balance activities, BLE strength   [employer], PT 11/25/2021, 1:00 PM

## 2021-11-27 NOTE — Therapy (Signed)
OUTPATIENT PHYSICAL THERAPY NEURO TREATMENT   Patient Name: Austin [name] MRN: [id number] DOB:[birth year], 88 y.o., male Today's Date: 11/29/2021   PCP: Burnard Bunting REFERRING PROVIDER: Lenoria Chime, MD    PT End of Session - 11/29/21 0849     Visit Number 2    Number of Visits 8    Date for PT Re-Evaluation 12/23/21    Authorization Type [employer]    Authorization Time Period approved 8 PT visits from 11/25/2021-12/23/2021    Authorization - Visit Number 2    Authorization - Number of Visits 8    PT Start Time 0850   pt late   PT Stop Time 0928    PT Time Calculation (min) 38 min    Equipment Utilized During Treatment Gait belt    Activity Tolerance Patient tolerated treatment well    Behavior During Therapy WFL for tasks assessed/performed              Past Medical History:  Diagnosis Date   Allergy    Asthma    BPH (benign prostatic hypertrophy)    CKD (chronic kidney disease) stage 3, GFR 30-59 ml/min (HCC)    Cough    Gout    Hypertensive chronic kidney disease    Melanoma in situ of right eyelid ([place])    OSA (obstructive sleep apnea)    Persistent atrial fibrillation ([place])    Past Surgical History:  Procedure Laterality Date   CARDIOVERSION [name] 06/05/2015   Procedure: CARDIOVERSION;  Surgeon: Sanda Klein, MD;  Location: [facility] ENDOSCOPY;  Service: Cardiovascular;  Laterality: [name];   CARDIOVERSION [name] 01/04/2021   Procedure: CARDIOVERSION;  Surgeon: [name], MD;  Location: [facility] ENDOSCOPY;  Service: Cardiovascular;  Laterality: [name];   CARDIOVERSION [name] 02/08/2021   Procedure: CARDIOVERSION;  Surgeon: Jerline Pain, MD;  Location: [facility] ENDOSCOPY;  Service: Cardiovascular;  Laterality: [name];   CARDIOVERSION [name] 04/14/2021   Procedure: CARDIOVERSION;  Surgeon: Freada Bergeron, MD;  Location: [facility] ENDOSCOPY;  Service: Cardiovascular;  Laterality: [name];   CARDIOVERSION [name] 07/05/2021   Procedure: CARDIOVERSION;  Surgeon: Fay Records, MD;   Location: [facility] ENDOSCOPY;  Service: Cardiovascular;  Laterality: [name];   EYE SURGERY  12/2017   Patient Active Problem List   Diagnosis Date Noted   Blind right eye 11/16/2021   Absent eyeball 11/16/2021   Subdural hematoma ([place]) 11/15/2021   (HFpEF) heart failure with preserved ejection fraction ([place]) 11/15/2021   Anemia 11/15/2021   Hematuria 11/15/2021   Dyspnea on exertion 09/19/2019   Atrial fibrillation and flutter ([place]) 03/08/2018   OSA on CPAP 03/08/2018   Atrial fibrillation, persistent ([place]) 08/10/2015   Atrial fibrillation ([place]) 07/29/2015   CPAP use counseling 07/29/2015   OSA and COPD overlap syndrome ([place]) 07/29/2015   Essential hypertension 06/22/2015   CKD (chronic kidney disease) 06/22/2015   Persistent atrial fibrillation ([place])    Gasping for breath 03/19/2015   Snoring 03/19/2015   Asthma, mild persistent 03/19/2015   COPD exacerbation ([place]) 03/19/2015   FUO (fever of unknown origin) 03/19/2013   Melanoma in situ of right eyelid ([place])    Neurotrophic keratoconjunctivitis 06/01/2011   Malignant neoplasm of conjunctiva ([place]) 03/02/2011   Trichiasis of eyelid 03/02/2011    ONSET DATE: 11/15/21  REFERRING DIAG: U44.5XAA (ICD-10-CM) - Subdural hematoma (HCC)   THERAPY DIAG:  Unsteadiness on feet  Muscle weakness (generalized)  Difficulty in walking, not elsewhere classified  Rationale for Evaluation and Treatment Rehabilitation  SUBJECTIVE:  SUBJECTIVE STATEMENT: Have not had any falls. Uses walking stick outside but not in the home.  Pt accompanied by: significant other  PERTINENT HISTORY: history of A fib on Xarelto, HFpEF, Asthma, CKD 3, and melanoma of the R eye s/p enucleation who presented with fall at home and subdural hematoma without mass effec   PAIN:  Are  you having pain? No  PRECAUTIONS: Falls  WEIGHT BEARING RESTRICTIONS No  PLOF: Independent and Independent with basic ADLs  PATIENT GOALS improve balance, be able to walk up steps while carrying things   OBJECTIVE:    TODAY'S TREATMENT: 11/29/21 Activity Comments  Nustep L5 x 5 min UEs/LEs Maintaining ~82 SPM  STS 10x Good control; cues to contract glutes to fully stand upright   Tandem balance at II bars 2x30" each More difficulty with R foot back, cueing to shift R  Mini squat 10x  Cues to avoid excessive squat depth and excessive use of UEs  Hip ABD/EX 2# each LE 10x Cues to avoid hip ER with ABD, difficulty maintaining knee straight with EX  Romberg EC 2x30" Moderate sway  Alt toe tap on cone 3x20 Frequently knocking over cone d/t limited depth perception; variable UE support    PATIENT EDUCATION: Education details: HEP update; edu on how limited vision can affect balance Person educated: Patient Education method: Explanation, Demonstration, Tactile cues, Verbal cues, and Handouts Education comprehension: verbalized understanding and returned demonstration   HOME EXERCISE PROGRAM Last updated: 11/25/21 Access Code: [email or web address] URL: [email or web address] Date: 11/29/2021 Prepared by: [facility]  Exercises - Sit to Stand with Arms Crossed  - 1 x daily - 5 x weekly - 2 sets - 10 reps - Standing Tandem Balance with Counter Support  - 1 x daily - 5 x weekly - 2 sets - 30 sec hold - Mini Squat with Counter Support  - 1 x daily - 5 x weekly - 2 sets - 10 reps - Standing Hip Abduction with Resistance at Ankles and Counter Support  - 1 x daily - 5 x weekly - 2 sets - 10 reps - Standing Hip Extension with Resistance at Ankles and Counter Support  - 1 x daily - 5 x weekly - 2 sets - 10 reps    Below measures were taken at time of initial evaluation unless otherwise specified:   DIAGNOSTIC FINDINGS: no mass  effect  COGNITION: Overall cognitive status: Impaired, short-term memory    SENSATION: WFL  COORDINATION: WFL  EDEMA:    MUSCLE TONE: WNL    POSTURE: increased thoracic kyphosis and scoliosis?  LOWER EXTREMITY ROM:     WFL   LOWER EXTREMITY MMT:    3+/5 gross BLE strength for resisted tests in sitting  BED MOBILITY:  independent  TRANSFERS: Assistive device utilized:  BUE   Sit to stand: Modified independence Stand to sit: Modified independence Chair to chair: Modified independence Floor:  DNT   STAIRS:  Level of Assistance: Modified independence  Stair Negotiation Technique: Step to Pattern Alternating Pattern  with Bilateral Rails  Number of Stairs: 10   Height of Stairs: 4-6"  Comments:   GAIT: Gait pattern: WFL Distance walked:  Assistive device utilized: Single point cane Level of assistance: Modified independence Comments: deficits during turns   FUNCTIONAL TESTs:  5 times sit to stand: 17.91 with retro-LOB and pushing knees into mat table Timed up and go (TUG): 15.75 sec with walking stick; 14.19 sec without AD Berg Balance Scale:  43/45 indicating high risk for falls  PATIENT SURVEYS:    TODAY'S TREATMENT:  Assessment; HEP initiated   PATIENT EDUCATION: Education details: assessment findings, rationale of PT intervention Person educated: Patient and Spouse Education method: Explanation Education comprehension: verbalized understanding   HOME EXERCISE PROGRAM: Access Code: [email or web address] URL: [email or web address] Date: 11/25/2021 Prepared by: Sherlyn Lees  Exercises - Sit to Stand with Arms Crossed  - 1 x daily - 7 x weekly - 3 sets - 5 reps - Standing Tandem Balance with Counter Support  - 1 x daily - 7 x weekly - 3 sets - 15-30 sec hold    GOALS: Goals reviewed with patient? Yes  SHORT TERM GOALS: Target date: 12/09/2021  Patient will be independent in HEP to improve functional outcomes Baseline: Goal  status: IN PROGRESS    LONG TERM GOALS: Target date: 12/23/2021  Demonstrate improved BLE strength and balance as evidenced by time of <15 sec 5xSTS test to reduce risk for falls Baseline: 17.91 18" seat height, several LOB, compensation with legs against table Goal status: IN PROGRESS  2.  Manifest low risk for falls per score 50/56 Berg Balance Test to improve safety with mobility and ADL Baseline: 43/56 Goal status: IN PROGRESS  3.  Reduce risk for falls per TUG test 12 sec Baseline: 14-17 sec Goal status: IN PROGRESS  4.  Demonstrate improved safety, balance, strength, and activity tolerance as evidenced by ability to carry heavy item up/down 12 steps to improve safety with access to beach house Baseline: unable Goal status: IN PROGRESS    ASSESSMENT:  CLINICAL IMPRESSION: Patient arrived to session without new complaints; denies falls. Reviewed HEP for max understanding and carryover. Required cueing to fully contract glutes with STS and shift further R with tandem balance. Proceeded with progressive LE strengthening as patient reports difficulty navigating stairs d/t weakness. Updated HEP with these exercises and educated on max safety with HEP- patient reported understanding. No complaints at end of session.    OBJECTIVE IMPAIRMENTS cardiopulmonary status limiting activity, decreased activity tolerance, decreased balance, decreased cognition, decreased endurance, decreased mobility, difficulty walking, decreased strength, and postural dysfunction.   ACTIVITY LIMITATIONS carrying, lifting, standing, squatting, stairs, transfers, and locomotion level  PARTICIPATION LIMITATIONS: meal prep, cleaning, laundry, community activity, and access to vacation home  PERSONAL FACTORS Age, Time since onset of injury/illness/exacerbation, and 1-2 comorbidities: A-fib, visual impairment  are also affecting patient's functional outcome.   REHAB POTENTIAL: Good  CLINICAL DECISION MAKING:  Evolving/moderate complexity  EVALUATION COMPLEXITY: Moderate  PLAN: PT FREQUENCY: 1-2x/week  PT DURATION: 4 weeks  PLANNED INTERVENTIONS: Therapeutic exercises, Therapeutic activity, Neuromuscular re-education, Balance training, Gait training, Patient/Family education, Self Care, Joint mobilization, Stair training, Vestibular training, Canalith repositioning, Orthotic/Fit training, DME instructions, Aquatic Therapy, Dry Needling, Cryotherapy, Moist heat, and Manual therapy  PLAN FOR NEXT SESSION: continue with balance activities, BLE strength  Janene Harvey, PT, DPT 11/29/21 9:44 AM  [facility] at [facility] [address], [place], [place] [address] Phone # [phone] Fax # [phone]

## 2021-11-29 ENCOUNTER — Encounter: Payer: Self-pay | Admitting: Physical Therapy

## 2021-11-29 ENCOUNTER — Ambulatory Visit: Payer: Medicare PPO | Admitting: Physical Therapy

## 2021-11-29 DIAGNOSIS — I129 Hypertensive chronic kidney disease with stage 1 through stage 4 chronic kidney disease, or unspecified chronic kidney disease: Secondary | ICD-10-CM | POA: Diagnosis not present

## 2021-11-29 DIAGNOSIS — S065XAA Traumatic subdural hemorrhage with loss of consciousness status unknown, initial encounter: Secondary | ICD-10-CM | POA: Diagnosis not present

## 2021-11-29 DIAGNOSIS — W19XXXA Unspecified fall, initial encounter: Secondary | ICD-10-CM | POA: Diagnosis not present

## 2021-11-29 DIAGNOSIS — M6281 Muscle weakness (generalized): Secondary | ICD-10-CM

## 2021-11-29 DIAGNOSIS — Z7901 Long term (current) use of anticoagulants: Secondary | ICD-10-CM | POA: Diagnosis not present

## 2021-11-29 DIAGNOSIS — R2681 Unsteadiness on feet: Secondary | ICD-10-CM

## 2021-11-29 DIAGNOSIS — I4891 Unspecified atrial fibrillation: Secondary | ICD-10-CM | POA: Diagnosis not present

## 2021-11-29 DIAGNOSIS — R262 Difficulty in walking, not elsewhere classified: Secondary | ICD-10-CM | POA: Diagnosis not present

## 2021-11-29 DIAGNOSIS — I5032 Chronic diastolic (congestive) heart failure: Secondary | ICD-10-CM | POA: Diagnosis not present

## 2021-11-29 DIAGNOSIS — T07XXXA Unspecified multiple injuries, initial encounter: Secondary | ICD-10-CM | POA: Diagnosis not present

## 2021-11-29 DIAGNOSIS — R319 Hematuria, unspecified: Secondary | ICD-10-CM | POA: Diagnosis not present

## 2021-12-01 ENCOUNTER — Ambulatory Visit: Payer: Medicare PPO

## 2021-12-01 DIAGNOSIS — R262 Difficulty in walking, not elsewhere classified: Secondary | ICD-10-CM | POA: Diagnosis not present

## 2021-12-01 DIAGNOSIS — R2681 Unsteadiness on feet: Secondary | ICD-10-CM | POA: Diagnosis not present

## 2021-12-01 DIAGNOSIS — M6281 Muscle weakness (generalized): Secondary | ICD-10-CM

## 2021-12-01 DIAGNOSIS — S065XAA Traumatic subdural hemorrhage with loss of consciousness status unknown, initial encounter: Secondary | ICD-10-CM | POA: Diagnosis not present

## 2021-12-01 NOTE — Therapy (Signed)
OUTPATIENT PHYSICAL THERAPY NEURO TREATMENT   Patient Name: Austin [name] MRN: [id number] DOB:1930-11-09, 88 y.o., male Today's Date: 12/01/2021   PCP: Burnard Bunting REFERRING PROVIDER: Lenoria Chime, MD    PT End of Session - 12/01/21 1403     Visit Number 3    Number of Visits 8    Date for PT Re-Evaluation 12/23/21    Authorization Type [employer]    Authorization Time Period approved 8 PT visits from 11/25/2021-12/23/2021    Authorization - Visit Number 3    Authorization - Number of Visits 8    PT Start Time 1400    PT Stop Time 1445    PT Time Calculation (min) 45 min    Equipment Utilized During Treatment Gait belt    Activity Tolerance Patient tolerated treatment well    Behavior During Therapy WFL for tasks assessed/performed              Past Medical History:  Diagnosis Date   Allergy    Asthma    BPH (benign prostatic hypertrophy)    CKD (chronic kidney disease) stage 3, GFR 30-59 ml/min (HCC)    Cough    Gout    Hypertensive chronic kidney disease    Melanoma in situ of right eyelid ([place])    OSA (obstructive sleep apnea)    Persistent atrial fibrillation ([place])    Past Surgical History:  Procedure Laterality Date   CARDIOVERSION [name] 06/05/2015   Procedure: CARDIOVERSION;  Surgeon: Sanda Klein, MD;  Location: [facility] ENDOSCOPY;  Service: Cardiovascular;  Laterality: [name];   CARDIOVERSION [name] 01/04/2021   Procedure: CARDIOVERSION;  Surgeon: [name], MD;  Location: [facility] ENDOSCOPY;  Service: Cardiovascular;  Laterality: [name];   CARDIOVERSION [name] 02/08/2021   Procedure: CARDIOVERSION;  Surgeon: Jerline Pain, MD;  Location: [facility] ENDOSCOPY;  Service: Cardiovascular;  Laterality: [name];   CARDIOVERSION [name] 04/14/2021   Procedure: CARDIOVERSION;  Surgeon: Freada Bergeron, MD;  Location: [facility] ENDOSCOPY;  Service: Cardiovascular;  Laterality: [name];   CARDIOVERSION [name] 07/05/2021   Procedure: CARDIOVERSION;  Surgeon: Fay Records, MD;   Location: [facility] ENDOSCOPY;  Service: Cardiovascular;  Laterality: [name];   EYE SURGERY  12/2017   Patient Active Problem List   Diagnosis Date Noted   Blind right eye 11/16/2021   Absent eyeball 11/16/2021   Subdural hematoma ([place]) 11/15/2021   (HFpEF) heart failure with preserved ejection fraction ([place]) 11/15/2021   Anemia 11/15/2021   Hematuria 11/15/2021   Dyspnea on exertion 09/19/2019   Atrial fibrillation and flutter ([place]) 03/08/2018   OSA on CPAP 03/08/2018   Atrial fibrillation, persistent ([place]) 08/10/2015   Atrial fibrillation ([place]) 07/29/2015   CPAP use counseling 07/29/2015   OSA and COPD overlap syndrome ([place]) 07/29/2015   Essential hypertension 06/22/2015   CKD (chronic kidney disease) 06/22/2015   Persistent atrial fibrillation ([place])    Gasping for breath 03/19/2015   Snoring 03/19/2015   Asthma, mild persistent 03/19/2015   COPD exacerbation ([place]) 03/19/2015   FUO (fever of unknown origin) 03/19/2013   Melanoma in situ of right eyelid ([place])    Neurotrophic keratoconjunctivitis 06/01/2011   Malignant neoplasm of conjunctiva ([place]) 03/02/2011   Trichiasis of eyelid 03/02/2011    ONSET DATE: 11/15/21  REFERRING DIAG: I20.5XAA (ICD-10-CM) - Subdural hematoma (HCC)   THERAPY DIAG:  Unsteadiness on feet  Muscle weakness (generalized)  Difficulty in walking, not elsewhere classified  Rationale for Evaluation and Treatment Rehabilitation  SUBJECTIVE:  SUBJECTIVE STATEMENT: Feeling sore in large muscles of leg and buttocks from exercise  Pt accompanied by: significant other  PERTINENT HISTORY: history of A fib on Xarelto, HFpEF, Asthma, CKD 3, and melanoma of the R eye s/p enucleation who presented with fall at home and subdural hematoma without mass effec   PAIN:  Are you having  pain? No  PRECAUTIONS: Falls  WEIGHT BEARING RESTRICTIONS No  PLOF: Independent and Independent with basic ADLs  PATIENT GOALS improve balance, be able to walk up steps while carrying things   OBJECTIVE:   TODAY'S TREATMENT: 12/01/21 Activity Comments  Sit-stand 3x5  Elevated seat 5# goblet  March 6x20 ft 5# unilateral carry  LE lift-off from 6" step 3x3 sec BUE, unilat, no UE--difficulty with no UE support  Tandem balance at II bars 2x30" each   Standing on foam wide/narrow BOS > EO 2x 30 sec > EC 2x 15 sec > EO/EC+head turns 5x --LOB with EC+head turn right   Standing hip abd and ext 1x12 For HEP review, cues for form/leg posture  Mini-squats x 10 BUE support, chair behind for form  Alt. Taps on 12" platform x 5 reps BUE, unilat, no UE--difficulty with no UE support       PATIENT EDUCATION: Education details: HEP update; edu on how limited vision can affect balance Person educated: Patient Education method: Explanation, Demonstration, Tactile cues, Verbal cues, and Handouts Education comprehension: verbalized understanding and returned demonstration   HOME EXERCISE PROGRAM Last updated: 11/25/21 Access Code: [email or web address] URL: [email or web address] Date: 11/29/2021 Prepared by: [facility]  Exercises - Sit to Stand with Arms Crossed  - 1 x daily - 5 x weekly - 2 sets - 10 reps - Standing Tandem Balance with Counter Support  - 1 x daily - 5 x weekly - 2 sets - 30 sec hold - Mini Squat with Counter Support  - 1 x daily - 5 x weekly - 2 sets - 10 reps - Standing Hip Abduction with Resistance at Ankles and Counter Support  - 1 x daily - 5 x weekly - 2 sets - 10 reps - Standing Hip Extension with Resistance at Ankles and Counter Support  - 1 x daily - 5 x weekly - 2 sets - 10 reps    Below measures were taken at time of initial evaluation unless otherwise specified:   DIAGNOSTIC FINDINGS: no mass  effect  COGNITION: Overall cognitive status: Impaired, short-term memory    SENSATION: WFL  COORDINATION: WFL  EDEMA:    MUSCLE TONE: WNL    POSTURE: increased thoracic kyphosis and scoliosis?  LOWER EXTREMITY ROM:     WFL   LOWER EXTREMITY MMT:    3+/5 gross BLE strength for resisted tests in sitting  BED MOBILITY:  independent  TRANSFERS: Assistive device utilized:  BUE   Sit to stand: Modified independence Stand to sit: Modified independence Chair to chair: Modified independence Floor:  DNT   STAIRS:  Level of Assistance: Modified independence  Stair Negotiation Technique: Step to Pattern Alternating Pattern  with Bilateral Rails  Number of Stairs: 10   Height of Stairs: 4-6"  Comments:   GAIT: Gait pattern: WFL Distance walked:  Assistive device utilized: Single point cane Level of assistance: Modified independence Comments: deficits during turns   FUNCTIONAL TESTs:  5 times sit to stand: 17.91 with retro-LOB and pushing knees into mat table Timed up and go (TUG): 15.75 sec with walking stick; 14.19 sec without  AD Berg Balance Scale: 43/45 indicating high risk for falls  PATIENT SURVEYS:    TODAY'S TREATMENT:  Assessment; HEP initiated   PATIENT EDUCATION: Education details: assessment findings, rationale of PT intervention Person educated: Patient and Spouse Education method: Explanation Education comprehension: verbalized understanding   HOME EXERCISE PROGRAM: Access Code: [phone] URL: [email or web address] Date: 11/25/2021 Prepared by: Sherlyn Lees  Exercises - Sit to Stand with Arms Crossed  - 1 x daily - 7 x weekly - 3 sets - 5 reps - Standing Tandem Balance with Counter Support  - 1 x daily - 7 x weekly - 3 sets - 15-30 sec hold    GOALS: Goals reviewed with patient? Yes  SHORT TERM GOALS: Target date: 12/09/2021  Patient will be independent in HEP to improve functional outcomes Baseline: Goal  status: IN PROGRESS    LONG TERM GOALS: Target date: 12/23/2021  Demonstrate improved BLE strength and balance as evidenced by time of <15 sec 5xSTS test to reduce risk for falls Baseline: 17.91 18" seat height, several LOB, compensation with legs against table Goal status: IN PROGRESS  2.  Manifest low risk for falls per score 50/56 Berg Balance Test to improve safety with mobility and ADL Baseline: 43/56 Goal status: IN PROGRESS  3.  Reduce risk for falls per TUG test 12 sec Baseline: 14-17 sec Goal status: IN PROGRESS  4.  Demonstrate improved safety, balance, strength, and activity tolerance as evidenced by ability to carry heavy item up/down 12 steps to improve safety with access to beach house Baseline: unable Goal status: IN PROGRESS    ASSESSMENT:  CLINICAL IMPRESSION: Difficulty with maintaining single limb support for any length of time experiencing left LOB more prominently especially when incorporating head movements and eyes closed.  Techniques to facilitate safe mobility practices to enhance ability to negotiate stair ambulation and safety with carrying items.  Continued sessions to progress strength, balance, and coordination to reduce risk for falls   OBJECTIVE IMPAIRMENTS cardiopulmonary status limiting activity, decreased activity tolerance, decreased balance, decreased cognition, decreased endurance, decreased mobility, difficulty walking, decreased strength, and postural dysfunction.   ACTIVITY LIMITATIONS carrying, lifting, standing, squatting, stairs, transfers, and locomotion level  PARTICIPATION LIMITATIONS: meal prep, cleaning, laundry, community activity, and access to vacation home  PERSONAL FACTORS Age, Time since onset of injury/illness/exacerbation, and 1-2 comorbidities: A-fib, visual impairment  are also affecting patient's functional outcome.   REHAB POTENTIAL: Good  CLINICAL DECISION MAKING: Evolving/moderate complexity  EVALUATION  COMPLEXITY: Moderate  PLAN: PT FREQUENCY: 1-2x/week  PT DURATION: 4 weeks  PLANNED INTERVENTIONS: Therapeutic exercises, Therapeutic activity, Neuromuscular re-education, Balance training, Gait training, Patient/Family education, Self Care, Joint mobilization, Stair training, Vestibular training, Canalith repositioning, Orthotic/Fit training, DME instructions, Aquatic Therapy, Dry Needling, Cryotherapy, Moist heat, and Manual therapy  PLAN FOR NEXT SESSION: continue with balance activities, BLE strength  4:05 PM, 12/01/21 M. Sherlyn Lees, PT, DPT Physical Therapist- [place] Office Number: [phone]   [place] at [facility] [address] [place], [place] [address] Phone # [phone] Fax # [phone]

## 2021-12-06 ENCOUNTER — Ambulatory Visit: Payer: Medicare PPO

## 2021-12-06 DIAGNOSIS — M6281 Muscle weakness (generalized): Secondary | ICD-10-CM | POA: Diagnosis not present

## 2021-12-06 DIAGNOSIS — R2681 Unsteadiness on feet: Secondary | ICD-10-CM | POA: Diagnosis not present

## 2021-12-06 DIAGNOSIS — R262 Difficulty in walking, not elsewhere classified: Secondary | ICD-10-CM

## 2021-12-06 DIAGNOSIS — S065XAA Traumatic subdural hemorrhage with loss of consciousness status unknown, initial encounter: Secondary | ICD-10-CM | POA: Diagnosis not present

## 2021-12-06 NOTE — Therapy (Signed)
OUTPATIENT PHYSICAL THERAPY NEURO TREATMENT   Patient Name: Austin [name] MRN: [id number] DOB:December 19, 1930, 88 y.o., male Today's Date: 12/06/2021   PCP: Burnard Bunting REFERRING PROVIDER: Lenoria Chime, MD    PT End of Session - 12/06/21 0800     Visit Number 4    Number of Visits 8    Date for PT Re-Evaluation 12/23/21    Authorization Type [employer]    Authorization Time Period approved 8 PT visits from 11/25/2021-12/23/2021    Authorization - Visit Number 4    Authorization - Number of Visits 8    PT Start Time 0800    PT Stop Time 0845    PT Time Calculation (min) 45 min    Equipment Utilized During Treatment Gait belt    Activity Tolerance Patient tolerated treatment well    Behavior During Therapy WFL for tasks assessed/performed              Past Medical History:  Diagnosis Date   Allergy    Asthma    BPH (benign prostatic hypertrophy)    CKD (chronic kidney disease) stage 3, GFR 30-59 ml/min (HCC)    Cough    Gout    Hypertensive chronic kidney disease    Melanoma in situ of right eyelid ([place])    OSA (obstructive sleep apnea)    Persistent atrial fibrillation ([place])    Past Surgical History:  Procedure Laterality Date   CARDIOVERSION [name] 06/05/2015   Procedure: CARDIOVERSION;  Surgeon: Sanda Klein, MD;  Location: [facility] ENDOSCOPY;  Service: Cardiovascular;  Laterality: [name];   CARDIOVERSION [name] 01/04/2021   Procedure: CARDIOVERSION;  Surgeon: [name], MD;  Location: [facility] ENDOSCOPY;  Service: Cardiovascular;  Laterality: [name];   CARDIOVERSION [name] 02/08/2021   Procedure: CARDIOVERSION;  Surgeon: Jerline Pain, MD;  Location: [facility] ENDOSCOPY;  Service: Cardiovascular;  Laterality: [name];   CARDIOVERSION [name] 04/14/2021   Procedure: CARDIOVERSION;  Surgeon: Freada Bergeron, MD;  Location: [facility] ENDOSCOPY;  Service: Cardiovascular;  Laterality: [name];   CARDIOVERSION [name] 07/05/2021   Procedure: CARDIOVERSION;  Surgeon: Fay Records, MD;   Location: [facility] ENDOSCOPY;  Service: Cardiovascular;  Laterality: [name];   EYE SURGERY  12/2017   Patient Active Problem List   Diagnosis Date Noted   Blind right eye 11/16/2021   Absent eyeball 11/16/2021   Subdural hematoma ([place]) 11/15/2021   (HFpEF) heart failure with preserved ejection fraction ([place]) 11/15/2021   Anemia 11/15/2021   Hematuria 11/15/2021   Dyspnea on exertion 09/19/2019   Atrial fibrillation and flutter ([place]) 03/08/2018   OSA on CPAP 03/08/2018   Atrial fibrillation, persistent ([place]) 08/10/2015   Atrial fibrillation ([place]) 07/29/2015   CPAP use counseling 07/29/2015   OSA and COPD overlap syndrome ([place]) 07/29/2015   Essential hypertension 06/22/2015   CKD (chronic kidney disease) 06/22/2015   Persistent atrial fibrillation ([place])    Gasping for breath 03/19/2015   Snoring 03/19/2015   Asthma, mild persistent 03/19/2015   COPD exacerbation ([place]) 03/19/2015   FUO (fever of unknown origin) 03/19/2013   Melanoma in situ of right eyelid ([place])    Neurotrophic keratoconjunctivitis 06/01/2011   Malignant neoplasm of conjunctiva ([place]) 03/02/2011   Trichiasis of eyelid 03/02/2011    ONSET DATE: 11/15/21  REFERRING DIAG: I62.5XAA (ICD-10-CM) - Subdural hematoma (HCC)   THERAPY DIAG:  Unsteadiness on feet  Muscle weakness (generalized)  Difficulty in walking, not elsewhere classified  Rationale for Evaluation and Treatment Rehabilitation  SUBJECTIVE:  SUBJECTIVE STATEMENT: Low back is feeling pretty sore, did a lot of yard work lifting rocks and filling in holes  Pt accompanied by: significant other  PERTINENT HISTORY: history of A fib on Xarelto, HFpEF, Asthma, CKD 3, and melanoma of the R eye s/p enucleation who presented with fall at home and subdural hematoma without mass effec    PAIN:  Are you having pain? No  PRECAUTIONS: Falls  WEIGHT BEARING RESTRICTIONS No  PLOF: Independent and Independent with basic ADLs  PATIENT GOALS improve balance, be able to walk up steps while carrying things   OBJECTIVE:   TODAY'S TREATMENT: 12/06/21 Activity Comments  LE warm-up/HEP review 2x10 Calf raise, mini-squats, hip abd/ext (yellow t-loop)  Tandem stance 2x30 sec ea   March 6x20 ft 8# unilateral carry--experienced SOB which he attributes to A-fib  LE lift-off from 8" step 3x3 sec BUE, unilat, no UE--difficulty with no UE support. 2.5# ankle weights  Sidestepping x 2 min 2.5# Cues for foot position  Alt foot taps 12" box 2.5# 3x10 BUE, unilat, no UE support  Standing on foam wide/narrow BOS > EO 2x 30 sec > EC 2x 15 sec > EO/EC+head turns 5x --LOB with EC+head turn right        PATIENT EDUCATION: Education details: HEP update; edu on how limited vision can affect balance Person educated: Patient Education method: Explanation, Demonstration, Tactile cues, Verbal cues, and Handouts Education comprehension: verbalized understanding and returned demonstration   HOME EXERCISE PROGRAM Last updated: 11/25/21 Access Code: [email or web address] URL: [email or web address] Date: 11/29/2021 Prepared by: [facility]  Exercises - Sit to Stand with Arms Crossed  - 1 x daily - 5 x weekly - 2 sets - 10 reps - Standing Tandem Balance with Counter Support  - 1 x daily - 5 x weekly - 2 sets - 30 sec hold - Mini Squat with Counter Support  - 1 x daily - 5 x weekly - 2 sets - 10 reps - Standing Hip Abduction with Resistance at Ankles and Counter Support  - 1 x daily - 5 x weekly - 2 sets - 10 reps - Standing Hip Extension with Resistance at Ankles and Counter Support  - 1 x daily - 5 x weekly - 2 sets - 10 reps    Below measures were taken at time of initial evaluation unless otherwise specified:   DIAGNOSTIC FINDINGS: no mass  effect  COGNITION: Overall cognitive status: Impaired, short-term memory    SENSATION: WFL  COORDINATION: WFL  EDEMA:    MUSCLE TONE: WNL    POSTURE: increased thoracic kyphosis and scoliosis?  LOWER EXTREMITY ROM:     WFL   LOWER EXTREMITY MMT:    3+/5 gross BLE strength for resisted tests in sitting  BED MOBILITY:  independent  TRANSFERS: Assistive device utilized:  BUE   Sit to stand: Modified independence Stand to sit: Modified independence Chair to chair: Modified independence Floor:  DNT   STAIRS:  Level of Assistance: Modified independence  Stair Negotiation Technique: Step to Pattern Alternating Pattern  with Bilateral Rails  Number of Stairs: 10   Height of Stairs: 4-6"  Comments:   GAIT: Gait pattern: WFL Distance walked:  Assistive device utilized: Single point cane Level of assistance: Modified independence Comments: deficits during turns   FUNCTIONAL TESTs:  5 times sit to stand: 17.91 with retro-LOB and pushing knees into mat table Timed up and go (TUG): 15.75 sec with walking stick; 14.19 sec without  AD Berg Balance Scale: 43/45 indicating high risk for falls  PATIENT SURVEYS:    TODAY'S TREATMENT:  Assessment; HEP initiated   PATIENT EDUCATION: Education details: assessment findings, rationale of PT intervention Person educated: Patient and Spouse Education method: Explanation Education comprehension: verbalized understanding   HOME EXERCISE PROGRAM: Access Code: [email or web address] URL: [email or web address] Date: 11/25/2021 Prepared by: Sherlyn Lees  Exercises - Sit to Stand with Arms Crossed  - 1 x daily - 7 x weekly - 3 sets - 5 reps - Standing Tandem Balance with Counter Support  - 1 x daily - 7 x weekly - 3 sets - 15-30 sec hold    GOALS: Goals reviewed with patient? Yes  SHORT TERM GOALS: Target date: 12/09/2021  Patient will be independent in HEP to improve functional outcomes Baseline: Goal  status: IN PROGRESS    LONG TERM GOALS: Target date: 12/23/2021  Demonstrate improved BLE strength and balance as evidenced by time of <15 sec 5xSTS test to reduce risk for falls Baseline: 17.91 18" seat height, several LOB, compensation with legs against table Goal status: IN PROGRESS  2.  Manifest low risk for falls per score 50/56 Berg Balance Test to improve safety with mobility and ADL Baseline: 43/56 Goal status: IN PROGRESS  3.  Reduce risk for falls per TUG test 12 sec Baseline: 14-17 sec Goal status: IN PROGRESS  4.  Demonstrate improved safety, balance, strength, and activity tolerance as evidenced by ability to carry heavy item up/down 12 steps to improve safety with access to beach house Baseline: unable Goal status: IN PROGRESS    ASSESSMENT:  CLINICAL IMPRESSION: Good HEP recall with improved form and use of yellow t-loop resistance.  Continued with dynamic balance activities with emphasis on improving single limb support moments and ability to enhance foot clearance and preparation to carry items up/down steps with tx session focus being these alternating movements with exaggerated hip flexion with resistance and core stabilization.  Difficulty with single limb support when no UE support afforded with left bias LOB.  Continued sessions indicated to progress POC details and improve safety with carrying items up/down stairs   OBJECTIVE IMPAIRMENTS cardiopulmonary status limiting activity, decreased activity tolerance, decreased balance, decreased cognition, decreased endurance, decreased mobility, difficulty walking, decreased strength, and postural dysfunction.   ACTIVITY LIMITATIONS carrying, lifting, standing, squatting, stairs, transfers, and locomotion level  PARTICIPATION LIMITATIONS: meal prep, cleaning, laundry, community activity, and access to vacation home  PERSONAL FACTORS Age, Time since onset of injury/illness/exacerbation, and 1-2 comorbidities: A-fib,  visual impairment  are also affecting patient's functional outcome.   REHAB POTENTIAL: Good  CLINICAL DECISION MAKING: Evolving/moderate complexity  EVALUATION COMPLEXITY: Moderate  PLAN: PT FREQUENCY: 1-2x/week  PT DURATION: 4 weeks  PLANNED INTERVENTIONS: Therapeutic exercises, Therapeutic activity, Neuromuscular re-education, Balance training, Gait training, Patient/Family education, Self Care, Joint mobilization, Stair training, Vestibular training, Canalith repositioning, Orthotic/Fit training, DME instructions, Aquatic Therapy, Dry Needling, Cryotherapy, Moist heat, and Manual therapy  PLAN FOR NEXT SESSION: continue with balance activities, BLE strength  8:00 AM, 12/06/21 M. Sherlyn Lees, PT, DPT Physical Therapist- [place] Office Number: [phone]   [place] [facility] [place] [address], [place], [place] [address] Phone # [phone] Fax # [phone]

## 2021-12-08 ENCOUNTER — Ambulatory Visit: Payer: Medicare PPO

## 2021-12-08 DIAGNOSIS — R2681 Unsteadiness on feet: Secondary | ICD-10-CM | POA: Diagnosis not present

## 2021-12-08 DIAGNOSIS — S065XAA Traumatic subdural hemorrhage with loss of consciousness status unknown, initial encounter: Secondary | ICD-10-CM | POA: Diagnosis not present

## 2021-12-08 DIAGNOSIS — M6281 Muscle weakness (generalized): Secondary | ICD-10-CM | POA: Diagnosis not present

## 2021-12-08 DIAGNOSIS — R262 Difficulty in walking, not elsewhere classified: Secondary | ICD-10-CM | POA: Diagnosis not present

## 2021-12-08 NOTE — Therapy (Signed)
OUTPATIENT PHYSICAL THERAPY NEURO TREATMENT   Patient Name: Austin [name] MRN: [id number] DOB:1931-05-16, 88 y.o., male Today's Date: 12/08/2021   PCP: Burnard Bunting REFERRING PROVIDER: Lenoria Chime, MD    PT End of Session - 12/08/21 0849     Visit Number 5    Number of Visits 8    Date for PT Re-Evaluation 12/23/21    Authorization Type [employer]    Authorization Time Period approved 8 PT visits from 11/25/2021-12/23/2021    Authorization - Visit Number 5    Authorization - Number of Visits 8    PT Start Time 0845    PT Stop Time 0845    PT Time Calculation (min) 0 min    Equipment Utilized During Treatment Gait belt    Activity Tolerance Patient tolerated treatment well    Behavior During Therapy WFL for tasks assessed/performed              Past Medical History:  Diagnosis Date   Allergy    Asthma    BPH (benign prostatic hypertrophy)    CKD (chronic kidney disease) stage 3, GFR 30-59 ml/min (HCC)    Cough    Gout    Hypertensive chronic kidney disease    Melanoma in situ of right eyelid ([place])    OSA (obstructive sleep apnea)    Persistent atrial fibrillation ([place])    Past Surgical History:  Procedure Laterality Date   CARDIOVERSION [name] 06/05/2015   Procedure: CARDIOVERSION;  Surgeon: Sanda Klein, MD;  Location: [facility] ENDOSCOPY;  Service: Cardiovascular;  Laterality: [name];   CARDIOVERSION [name] 01/04/2021   Procedure: CARDIOVERSION;  Surgeon: [name], MD;  Location: [facility] ENDOSCOPY;  Service: Cardiovascular;  Laterality: [name];   CARDIOVERSION [name] 02/08/2021   Procedure: CARDIOVERSION;  Surgeon: Jerline Pain, MD;  Location: [facility] ENDOSCOPY;  Service: Cardiovascular;  Laterality: [name];   CARDIOVERSION [name] 04/14/2021   Procedure: CARDIOVERSION;  Surgeon: Freada Bergeron, MD;  Location: [facility] ENDOSCOPY;  Service: Cardiovascular;  Laterality: [name];   CARDIOVERSION [name] 07/05/2021   Procedure: CARDIOVERSION;  Surgeon: Fay Records, MD;   Location: [facility] ENDOSCOPY;  Service: Cardiovascular;  Laterality: [name];   EYE SURGERY  12/2017   Patient Active Problem List   Diagnosis Date Noted   Blind right eye 11/16/2021   Absent eyeball 11/16/2021   Subdural hematoma ([place]) 11/15/2021   (HFpEF) heart failure with preserved ejection fraction ([place]) 11/15/2021   Anemia 11/15/2021   Hematuria 11/15/2021   Dyspnea on exertion 09/19/2019   Atrial fibrillation and flutter ([place]) 03/08/2018   OSA on CPAP 03/08/2018   Atrial fibrillation, persistent ([place]) 08/10/2015   Atrial fibrillation ([place]) 07/29/2015   CPAP use counseling 07/29/2015   OSA and COPD overlap syndrome ([place]) 07/29/2015   Essential hypertension 06/22/2015   CKD (chronic kidney disease) 06/22/2015   Persistent atrial fibrillation ([place])    Gasping for breath 03/19/2015   Snoring 03/19/2015   Asthma, mild persistent 03/19/2015   COPD exacerbation ([place]) 03/19/2015   FUO (fever of unknown origin) 03/19/2013   Melanoma in situ of right eyelid ([place])    Neurotrophic keratoconjunctivitis 06/01/2011   Malignant neoplasm of conjunctiva ([place]) 03/02/2011   Trichiasis of eyelid 03/02/2011    ONSET DATE: 11/15/21  REFERRING DIAG: H96.5XAA (ICD-10-CM) - Subdural hematoma (HCC)   THERAPY DIAG:  Unsteadiness on feet  Muscle weakness (generalized)  Difficulty in walking, not elsewhere classified  Rationale for Evaluation and Treatment Rehabilitation  SUBJECTIVE:  SUBJECTIVE STATEMENT: Feeling lightheaded and as if I'm going to pass out. Pt accompanied by: significant other  PERTINENT HISTORY: history of A fib on Xarelto, HFpEF, Asthma, CKD 3, and melanoma of the R eye s/p enucleation who presented with fall at home and subdural hematoma without mass effec   PAIN:  Are you having pain?  No  PRECAUTIONS: Falls  WEIGHT BEARING RESTRICTIONS No  PLOF: Independent and Independent with basic ADLs  PATIENT GOALS improve balance, be able to walk up steps while carrying things   OBJECTIVE:   TODAY'S TREATMENT: 12/08/21 Activity Comments  Vitals in sitting 90/57 mmHg, 53 bpm, 98%  Mat table there ex 3# ankle weights -SAQ 3x12 -hip add iso 3x12, 2 sec hold -SLR 2x12 -sidelying hip abd 2x12   Vitals in supine after activity 112/63 mmHg, 50 bpm, 94%  Vitals in sitting after activity 98/56 mmHg, 53 bpm, 95%          TODAY'S TREATMENT: 12/06/21 Activity Comments  LE warm-up/HEP review 2x10 Calf raise, mini-squats, hip abd/ext (yellow t-loop)  Tandem stance 2x30 sec ea   March 6x20 ft 8# unilateral carry--experienced SOB which he attributes to A-fib  LE lift-off from 8" step 3x3 sec BUE, unilat, no UE--difficulty with no UE support. 2.5# ankle weights  Sidestepping x 2 min 2.5# Cues for foot position  Alt foot taps 12" box 2.5# 3x10 BUE, unilat, no UE support  Standing on foam wide/narrow BOS > EO 2x 30 sec > EC 2x 15 sec > EO/EC+head turns 5x --LOB with EC+head turn right        PATIENT EDUCATION: Education details: HEP update; edu on how limited vision can affect balance Person educated: Patient Education method: Explanation, Demonstration, Tactile cues, Verbal cues, and Handouts Education comprehension: verbalized understanding and returned demonstration   HOME EXERCISE PROGRAM Last updated: 11/25/21 Access Code: [email or web address] URL: [email or web address] Date: 11/29/2021 Prepared by: [facility]  Exercises - Sit to Stand with Arms Crossed  - 1 x daily - 5 x weekly - 2 sets - 10 reps - Standing Tandem Balance with Counter Support  - 1 x daily - 5 x weekly - 2 sets - 30 sec hold - Mini Squat with Counter Support  - 1 x daily - 5 x weekly - 2 sets - 10 reps - Standing Hip Abduction with Resistance at Ankles and  Counter Support  - 1 x daily - 5 x weekly - 2 sets - 10 reps - Standing Hip Extension with Resistance at Ankles and Counter Support  - 1 x daily - 5 x weekly - 2 sets - 10 reps    Below measures were taken at time of initial evaluation unless otherwise specified:   DIAGNOSTIC FINDINGS: no mass effect  COGNITION: Overall cognitive status: Impaired, short-term memory    SENSATION: WFL  COORDINATION: WFL  EDEMA:    MUSCLE TONE: WNL    POSTURE: increased thoracic kyphosis and scoliosis?  LOWER EXTREMITY ROM:     WFL   LOWER EXTREMITY MMT:    3+/5 gross BLE strength for resisted tests in sitting  BED MOBILITY:  independent  TRANSFERS: Assistive device utilized:  BUE   Sit to stand: Modified independence Stand to sit: Modified independence Chair to chair: Modified independence Floor:  DNT   STAIRS:  Level of Assistance: Modified independence  Stair Negotiation Technique: Step to Pattern Alternating Pattern  with Bilateral Rails  Number of Stairs: 10   Height of  Stairs: 4-6"  Comments:   GAIT: Gait pattern: WFL Distance walked:  Assistive device utilized: Single point cane Level of assistance: Modified independence Comments: deficits during turns   FUNCTIONAL TESTs:  5 times sit to stand: 17.91 with retro-LOB and pushing knees into mat table Timed up and go (TUG): 15.75 sec with walking stick; 14.19 sec without AD Berg Balance Scale: 43/45 indicating high risk for falls  PATIENT SURVEYS:    TODAY'S TREATMENT:  Assessment; HEP initiated   PATIENT EDUCATION: Education details: assessment findings, rationale of PT intervention Person educated: Patient and Spouse Education method: Explanation Education comprehension: verbalized understanding   HOME EXERCISE PROGRAM: Access Code: [phone] URL: [email or web address] Date: 11/25/2021 Prepared by: Sherlyn Lees  Exercises - Sit to Stand with Arms Crossed  - 1 x daily - 7 x weekly  - 3 sets - 5 reps - Standing Tandem Balance with Counter Support  - 1 x daily - 7 x weekly - 3 sets - 15-30 sec hold    GOALS: Goals reviewed with patient? Yes  SHORT TERM GOALS: Target date: 12/09/2021  Patient will be independent in HEP to improve functional outcomes Baseline: Goal status: IN PROGRESS    LONG TERM GOALS: Target date: 12/23/2021  Demonstrate improved BLE strength and balance as evidenced by time of <15 sec 5xSTS test to reduce risk for falls Baseline: 17.91 18" seat height, several LOB, compensation with legs against table Goal status: IN PROGRESS  2.  Manifest low risk for falls per score 50/56 Berg Balance Test to improve safety with mobility and ADL Baseline: 43/56 Goal status: IN PROGRESS  3.  Reduce risk for falls per TUG test 12 sec Baseline: 14-17 sec Goal status: IN PROGRESS  4.  Demonstrate improved safety, balance, strength, and activity tolerance as evidenced by ability to carry heavy item up/down 12 steps to improve safety with access to beach house Baseline: unable Goal status: IN PROGRESS    ASSESSMENT:  CLINICAL IMPRESSION: Activity modified today to perform open chain strengthening on mat table due to experiencing lower BP and HR with symptomatic presentation of lightheadedness and feeling weak/tired.  Improved BP response in supine but not much change in HR despite exertion.  Recommend light activity/mobility when returning home and to increase his water intake and to measure BP after 1-2 hr to re-assess. Tactile cues needed for form when performing isolated strengthening movements. Continued sessions to progress balance and LE strength as capable to reduce risk for falls   OBJECTIVE IMPAIRMENTS cardiopulmonary status limiting activity, decreased activity tolerance, decreased balance, decreased cognition, decreased endurance, decreased mobility, difficulty walking, decreased strength, and postural dysfunction.   ACTIVITY LIMITATIONS  carrying, lifting, standing, squatting, stairs, transfers, and locomotion level  PARTICIPATION LIMITATIONS: meal prep, cleaning, laundry, community activity, and access to vacation home  PERSONAL FACTORS Age, Time since onset of injury/illness/exacerbation, and 1-2 comorbidities: A-fib, visual impairment  are also affecting patient's functional outcome.   REHAB POTENTIAL: Good  CLINICAL DECISION MAKING: Evolving/moderate complexity  EVALUATION COMPLEXITY: Moderate  PLAN: PT FREQUENCY: 1-2x/week  PT DURATION: 4 weeks  PLANNED INTERVENTIONS: Therapeutic exercises, Therapeutic activity, Neuromuscular re-education, Balance training, Gait training, Patient/Family education, Self Care, Joint mobilization, Stair training, Vestibular training, Canalith repositioning, Orthotic/Fit training, DME instructions, Aquatic Therapy, Dry Needling, Cryotherapy, Moist heat, and Manual therapy  PLAN FOR NEXT SESSION: continue with balance activities, BLE strength  8:55 AM, 12/08/21 M. Sherlyn Lees, PT, DPT Physical Therapist- [place] Office Number: [phone]   [place] at [facility] [phone]  [place], [place], [place] [address] Phone # [phone] Fax # [phone]

## 2021-12-09 DIAGNOSIS — R351 Nocturia: Secondary | ICD-10-CM | POA: Diagnosis not present

## 2021-12-09 DIAGNOSIS — R3121 Asymptomatic microscopic hematuria: Secondary | ICD-10-CM | POA: Diagnosis not present

## 2021-12-09 DIAGNOSIS — R3912 Poor urinary stream: Secondary | ICD-10-CM | POA: Diagnosis not present

## 2021-12-09 DIAGNOSIS — R350 Frequency of micturition: Secondary | ICD-10-CM | POA: Diagnosis not present

## 2021-12-09 DIAGNOSIS — N401 Enlarged prostate with lower urinary tract symptoms: Secondary | ICD-10-CM | POA: Diagnosis not present

## 2021-12-13 ENCOUNTER — Other Ambulatory Visit: Payer: Self-pay | Admitting: Cardiology

## 2021-12-13 ENCOUNTER — Ambulatory Visit: Payer: Medicare PPO

## 2021-12-13 DIAGNOSIS — R2681 Unsteadiness on feet: Secondary | ICD-10-CM | POA: Diagnosis not present

## 2021-12-13 DIAGNOSIS — M6281 Muscle weakness (generalized): Secondary | ICD-10-CM

## 2021-12-13 DIAGNOSIS — R262 Difficulty in walking, not elsewhere classified: Secondary | ICD-10-CM

## 2021-12-13 DIAGNOSIS — S065XAA Traumatic subdural hemorrhage with loss of consciousness status unknown, initial encounter: Secondary | ICD-10-CM | POA: Diagnosis not present

## 2021-12-13 NOTE — Therapy (Signed)
OUTPATIENT PHYSICAL THERAPY NEURO TREATMENT   Patient Name: Austin [name] MRN: [id number] DOB:[birth year], 88 y.o., male Today's Date: 12/13/2021   PCP: Burnard Bunting REFERRING PROVIDER: Lenoria Chime, MD    PT End of Session - 12/13/21 0845     Visit Number 6    Number of Visits 8    Date for PT Re-Evaluation 12/23/21    Authorization Type [employer]    Authorization Time Period approved 8 PT visits from 11/25/2021-12/23/2021    Authorization - Visit Number 6    Authorization - Number of Visits 8    PT Start Time 0845    PT Stop Time 0930    PT Time Calculation (min) 45 min    Equipment Utilized During Treatment Gait belt    Activity Tolerance Patient tolerated treatment well    Behavior During Therapy WFL for tasks assessed/performed              Past Medical History:  Diagnosis Date   Allergy    Asthma    BPH (benign prostatic hypertrophy)    CKD (chronic kidney disease) stage 3, GFR 30-59 ml/min (HCC)    Cough    Gout    Hypertensive chronic kidney disease    Melanoma in situ of right eyelid ([place])    OSA (obstructive sleep apnea)    Persistent atrial fibrillation ([place])    Past Surgical History:  Procedure Laterality Date   CARDIOVERSION [name] 06/05/2015   Procedure: CARDIOVERSION;  Surgeon: Sanda Klein, MD;  [facility]: [facility] ENDOSCOPY;  Service: Cardiovascular;  Laterality: [name];   CARDIOVERSION [name] 01/04/2021   Procedure: CARDIOVERSION;  Surgeon: [name], MD;  Location: [facility] ENDOSCOPY;  Service: Cardiovascular;  Laterality: [name];   CARDIOVERSION [name] 02/08/2021   Procedure: CARDIOVERSION;  Surgeon: Jerline Pain, MD;  Location: [facility] ENDOSCOPY;  Service: Cardiovascular;  Laterality: [name];   CARDIOVERSION [name] 04/14/2021   Procedure: CARDIOVERSION;  Surgeon: Freada Bergeron, MD;  Location: [facility] ENDOSCOPY;  Service: Cardiovascular;  Laterality: [name];   CARDIOVERSION [name] 07/05/2021   Procedure: CARDIOVERSION;  Surgeon: Fay Records, MD;   Location: [facility] ENDOSCOPY;  Service: Cardiovascular;  Laterality: [name];   EYE SURGERY  12/2017   Patient Active Problem List   Diagnosis Date Noted   Blind right eye 11/16/2021   Absent eyeball 11/16/2021   Subdural hematoma ([place]) 11/15/2021   (HFpEF) heart failure with preserved ejection fraction ([place]) 11/15/2021   Anemia 11/15/2021   Hematuria 11/15/2021   Dyspnea on exertion 09/19/2019   Atrial fibrillation and flutter ([place]) 03/08/2018   OSA on CPAP 03/08/2018   Atrial fibrillation, persistent ([place]) 08/10/2015   Atrial fibrillation ([place]) 07/29/2015   CPAP use counseling 07/29/2015   OSA and COPD overlap syndrome ([place]) 07/29/2015   Essential hypertension 06/22/2015   CKD (chronic kidney disease) 06/22/2015   Persistent atrial fibrillation ([place])    Gasping for breath 03/19/2015   Snoring 03/19/2015   Asthma, mild persistent 03/19/2015   COPD exacerbation ([place]) 03/19/2015   FUO (fever of unknown origin) 03/19/2013   Melanoma in situ of right eyelid ([place])    Neurotrophic keratoconjunctivitis 06/01/2011   Malignant neoplasm of conjunctiva ([place]) 03/02/2011   Trichiasis of eyelid 03/02/2011    ONSET DATE: 11/15/21  REFERRING DIAG: N36.5XAA (ICD-10-CM) - Subdural hematoma (HCC)   THERAPY DIAG:  Unsteadiness on feet  Muscle weakness (generalized)  Difficulty in walking, not elsewhere classified  Rationale for Evaluation and Treatment Rehabilitation  SUBJECTIVE:  SUBJECTIVE STATEMENT: gashed up the left knee when tripped over dog Pt accompanied by: significant other  PERTINENT HISTORY: history of A fib on Xarelto, HFpEF, Asthma, CKD 3, and melanoma of the R eye s/p enucleation who presented with fall at home and subdural hematoma without mass effec   PAIN:  Are you having pain?  No  PRECAUTIONS: Falls  WEIGHT BEARING RESTRICTIONS No  PLOF: Independent and Independent with basic ADLs  PATIENT GOALS improve balance, be able to walk up steps while carrying things   OBJECTIVE:   TODAY'S TREATMENT: 12/13/21 Activity Comments  Balance in parallel bars -Feet together EC x 15 sec -semi-tandem EC x 15 sec -EO/EC+head turns on firm/foam Increased LOB with vertical head movements on foam  Standing on foam -Marching unilateral hold 5# -med ball twists 4# -postural perturbations 2x2 min for ankle strategy  Stair ambulation  Carrying 5# and HR; 5 trips   Reactive balance:posterior, anterior, lateral 6x ea direction, initial cues for stepping vs grabbing bars with hands                 PATIENT EDUCATION: Education details: HEP update; edu on how limited vision can affect balance Person educated: Patient Education method: Explanation, Demonstration, Tactile cues, Verbal cues, and Handouts Education comprehension: verbalized understanding and returned demonstration   HOME EXERCISE PROGRAM Last updated: 11/25/21 Access Code: [email or web address] URL: [email or web address] Date: 11/29/2021 Prepared by: [facility]  Exercises - Sit to Stand with Arms Crossed  - 1 x daily - 5 x weekly - 2 sets - 10 reps - Standing Tandem Balance with Counter Support  - 1 x daily - 5 x weekly - 2 sets - 30 sec hold - Mini Squat with Counter Support  - 1 x daily - 5 x weekly - 2 sets - 10 reps - Standing Hip Abduction with Resistance at Ankles and Counter Support  - 1 x daily - 5 x weekly - 2 sets - 10 reps - Standing Hip Extension with Resistance at Ankles and Counter Support  - 1 x daily - 5 x weekly - 2 sets - 10 reps    Below measures were taken at time of initial evaluation unless otherwise specified:   DIAGNOSTIC FINDINGS: no mass effect  COGNITION: Overall cognitive status: Impaired, short-term memory     SENSATION: WFL  COORDINATION: WFL  EDEMA:    MUSCLE TONE: WNL    POSTURE: increased thoracic kyphosis and scoliosis?  LOWER EXTREMITY ROM:     WFL   LOWER EXTREMITY MMT:    3+/5 gross BLE strength for resisted tests in sitting  BED MOBILITY:  independent  TRANSFERS: Assistive device utilized:  BUE   Sit to stand: Modified independence Stand to sit: Modified independence Chair to chair: Modified independence Floor:  DNT   STAIRS:  Level of Assistance: Modified independence  Stair Negotiation Technique: Step to Pattern Alternating Pattern  with Bilateral Rails  Number of Stairs: 10   Height of Stairs: 4-6"  Comments:   GAIT: Gait pattern: WFL Distance walked:  Assistive device utilized: Single point cane Level of assistance: Modified independence Comments: deficits during turns   FUNCTIONAL TESTs:  5 times sit to stand: 17.91 with retro-LOB and pushing knees into mat table Timed up and go (TUG): 15.75 sec with walking stick; 14.19 sec without AD Berg Balance Scale: 43/45 indicating high risk for falls  PATIENT SURVEYS:    TODAY'S TREATMENT:  Assessment; HEP initiated   PATIENT  EDUCATION: Education details: assessment findings, rationale of PT intervention Person educated: Patient and Spouse Education method: Explanation Education comprehension: verbalized understanding   HOME EXERCISE PROGRAM: Access Code: [email or web address] URL: [email or web address] Date: 11/25/2021 Prepared by: Sherlyn Lees  Exercises - Sit to Stand with Arms Crossed  - 1 x daily - 7 x weekly - 3 sets - 5 reps - Standing Tandem Balance with Counter Support  - 1 x daily - 7 x weekly - 3 sets - 15-30 sec hold    GOALS: Goals reviewed with patient? Yes  SHORT TERM GOALS: Target date: 12/09/2021  Patient will be independent in HEP to improve functional outcomes Baseline: Goal status: IN PROGRESS    LONG TERM GOALS: Target date: 12/23/2021  Demonstrate  improved BLE strength and balance as evidenced by time of <15 sec 5xSTS test to reduce risk for falls Baseline: 17.91 18" seat height, several LOB, compensation with legs against table Goal status: IN PROGRESS  2.  Manifest low risk for falls per score 50/56 Berg Balance Test to improve safety with mobility and ADL Baseline: 43/56 Goal status: IN PROGRESS  3.  Reduce risk for falls per TUG test 12 sec Baseline: 14-17 sec Goal status: IN PROGRESS  4.  Demonstrate improved safety, balance, strength, and activity tolerance as evidenced by ability to carry heavy item up/down 12 steps to improve safety with access to beach house Baseline: unable Goal status: IN PROGRESS    ASSESSMENT:  CLINICAL IMPRESSION: Good performance of HEP activities and addition of more dynamic balance activities to increase comfort and safety with carrying items and stair negotiation.  Reactive balance activities reveals good implentation of stepping strategy, albeit some delay 25% of trials reqiuring external support to correct. Will continue with these strategies   OBJECTIVE IMPAIRMENTS cardiopulmonary status limiting activity, decreased activity tolerance, decreased balance, decreased cognition, decreased endurance, decreased mobility, difficulty walking, decreased strength, and postural dysfunction.   ACTIVITY LIMITATIONS carrying, lifting, standing, squatting, stairs, transfers, and locomotion level  PARTICIPATION LIMITATIONS: meal prep, cleaning, laundry, community activity, and access to vacation home  PERSONAL FACTORS Age, Time since onset of injury/illness/exacerbation, and 1-2 comorbidities: A-fib, visual impairment  are also affecting patient's functional outcome.   REHAB POTENTIAL: Good  CLINICAL DECISION MAKING: Evolving/moderate complexity  EVALUATION COMPLEXITY: Moderate  PLAN: PT FREQUENCY: 1-2x/week  PT DURATION: 4 weeks  PLANNED INTERVENTIONS: Therapeutic exercises, Therapeutic  activity, Neuromuscular re-education, Balance training, Gait training, Patient/Family education, Self Care, Joint mobilization, Stair training, Vestibular training, Canalith repositioning, Orthotic/Fit training, DME instructions, Aquatic Therapy, Dry Needling, Cryotherapy, Moist heat, and Manual therapy  PLAN FOR NEXT SESSION: train spouse in reactive balance?  8:46 AM, 12/13/21 M. Sherlyn Lees, PT, DPT Physical Therapist- [place] Office Number: [phone]   [place] at [facility] [address] [place], [place] [address] Phone # [phone] Fax # [phone]

## 2021-12-14 ENCOUNTER — Other Ambulatory Visit: Payer: Self-pay | Admitting: Urology

## 2021-12-14 DIAGNOSIS — R3129 Other microscopic hematuria: Secondary | ICD-10-CM

## 2021-12-15 ENCOUNTER — Ambulatory Visit
Admission: RE | Admit: 2021-12-15 | Discharge: 2021-12-15 | Disposition: A | Discharge status: Home / Self Care | Payer: Medicare PPO | Source: Ambulatory Visit | Attending: Urology | Admitting: Urology

## 2021-12-15 ENCOUNTER — Ambulatory Visit: Payer: Medicare PPO | Attending: Family Medicine

## 2021-12-15 DIAGNOSIS — R2681 Unsteadiness on feet: Secondary | ICD-10-CM | POA: Diagnosis not present

## 2021-12-15 DIAGNOSIS — R3129 Other microscopic hematuria: Secondary | ICD-10-CM

## 2021-12-15 DIAGNOSIS — N281 Cyst of kidney, acquired: Secondary | ICD-10-CM | POA: Diagnosis not present

## 2021-12-15 DIAGNOSIS — M6281 Muscle weakness (generalized): Secondary | ICD-10-CM | POA: Insufficient documentation

## 2021-12-15 DIAGNOSIS — R262 Difficulty in walking, not elsewhere classified: Secondary | ICD-10-CM | POA: Insufficient documentation

## 2021-12-15 DIAGNOSIS — H5410 Blindness, one eye, low vision other eye, unspecified eyes: Secondary | ICD-10-CM | POA: Diagnosis not present

## 2021-12-15 NOTE — Therapy (Signed)
OUTPATIENT PHYSICAL THERAPY NEURO TREATMENT   Patient Name: Austin [name] MRN: [id number] DOB:1930/11/15, 88 y.o., male Today's Date: 12/15/2021   PCP: Burnard Bunting REFERRING PROVIDER: Lenoria Chime, MD    PT End of Session - 12/15/21 0845     Visit Number 7    Number of Visits 8    Date for PT Re-Evaluation 12/23/21    Authorization Type [employer]    Authorization Time Period approved 8 PT visits from 11/25/2021-12/23/2021    Authorization - Visit Number 7    Authorization - Number of Visits 8    PT Start Time 0845    PT Stop Time 0930    PT Time Calculation (min) 45 min    Equipment Utilized During Treatment Gait belt    Activity Tolerance Patient tolerated treatment well    Behavior During Therapy WFL for tasks assessed/performed              Past Medical History:  Diagnosis Date   Allergy    Asthma    BPH (benign prostatic hypertrophy)    CKD (chronic kidney disease) stage 3, GFR 30-59 ml/min (HCC)    Cough    Gout    Hypertensive chronic kidney disease    Melanoma in situ of right eyelid ([place])    OSA (obstructive sleep apnea)    Persistent atrial fibrillation ([place])    Past Surgical History:  Procedure Laterality Date   CARDIOVERSION [name] 06/05/2015   Procedure: CARDIOVERSION;  Surgeon: Sanda Klein, MD;  Location: [facility] ENDOSCOPY;  Service: Cardiovascular;  Laterality: [name];   CARDIOVERSION [name] 01/04/2021   Procedure: CARDIOVERSION;  Surgeon: [name], MD;  Location: [facility] ENDOSCOPY;  Service: Cardiovascular;  Laterality: [name];   CARDIOVERSION [name] 02/08/2021   Procedure: CARDIOVERSION;  Surgeon: Jerline Pain, MD;  Location: [facility] ENDOSCOPY;  Service: Cardiovascular;  Laterality: [name];   CARDIOVERSION [name] 04/14/2021   Procedure: CARDIOVERSION;  Surgeon: Freada Bergeron, MD;  Location: [facility] ENDOSCOPY;  Service: Cardiovascular;  Laterality: [name];   CARDIOVERSION [name] 07/05/2021   Procedure: CARDIOVERSION;  Surgeon: Fay Records, MD;   Location: [facility] ENDOSCOPY;  Service: Cardiovascular;  Laterality: [name];   EYE SURGERY  12/2017   Patient Active Problem List   Diagnosis Date Noted   Blind right eye 11/16/2021   Absent eyeball 11/16/2021   Subdural hematoma ([place]) 11/15/2021   (HFpEF) heart failure with preserved ejection fraction ([place]) 11/15/2021   Anemia 11/15/2021   Hematuria 11/15/2021   Dyspnea on exertion 09/19/2019   Atrial fibrillation and flutter ([place]) 03/08/2018   OSA on CPAP 03/08/2018   Atrial fibrillation, persistent ([place]) 08/10/2015   Atrial fibrillation ([place]) 07/29/2015   CPAP use counseling 07/29/2015   OSA and COPD overlap syndrome ([place]) 07/29/2015   Essential hypertension 06/22/2015   CKD (chronic kidney disease) 06/22/2015   Persistent atrial fibrillation ([place])    Gasping for breath 03/19/2015   Snoring 03/19/2015   Asthma, mild persistent 03/19/2015   COPD exacerbation ([place]) 03/19/2015   FUO (fever of unknown origin) 03/19/2013   Melanoma in situ of right eyelid ([place])    Neurotrophic keratoconjunctivitis 06/01/2011   Malignant neoplasm of conjunctiva ([place]) 03/02/2011   Trichiasis of eyelid 03/02/2011    ONSET DATE: 11/15/21  REFERRING DIAG: G26.5XAA (ICD-10-CM) - Subdural hematoma (HCC)   THERAPY DIAG:  Unsteadiness on feet  Muscle weakness (generalized)  Difficulty in walking, not elsewhere classified  Rationale for Evaluation and Treatment Rehabilitation  SUBJECTIVE:  SUBJECTIVE STATEMENT: Feeling ok, knee is feeling a bit better Pt accompanied by: significant other  PERTINENT HISTORY: history of A fib on Xarelto, HFpEF, Asthma, CKD 3, and melanoma of the R eye s/p enucleation who presented with fall at home and subdural hematoma without mass effec   PAIN:  Are you having pain?  No  PRECAUTIONS: Falls  WEIGHT BEARING RESTRICTIONS No  PLOF: Independent and Independent with basic ADLs  PATIENT GOALS improve balance, be able to walk up steps while carrying things   OBJECTIVE:   TODAY'S TREATMENT: 12/15/21 Activity Comments  Reactive balance: righting reactions and postural perturbations Performed with spouse   LAQ 3x10 5#   Alt. Stair taps 3x10 8" step 5# ankle weights  Foot on step Head turns/vertical--LOB with vertical head movements  Sidestepping x 2 min 5# weights-CGA due to unsteadiness  Retrowalking x 2 min CGA due to unsteadiness        TODAY'S TREATMENT: 12/13/21 Activity Comments  Balance in parallel bars -Feet together EC x 15 sec -semi-tandem EC x 15 sec -EO/EC+head turns on firm/foam Increased LOB with vertical head movements on foam  Standing on foam -Marching unilateral hold 5# -med ball twists 4# -postural perturbations 2x2 min for ankle strategy  Stair ambulation  Carrying 5# and HR; 5 trips   Reactive balance:posterior, anterior, lateral 6x ea direction, initial cues for stepping vs grabbing bars with hands                 PATIENT EDUCATION: Education details: HEP update; edu on how limited vision can affect balance Person educated: Patient Education method: Explanation, Demonstration, Tactile cues, Verbal cues, and Handouts Education comprehension: verbalized understanding and returned demonstration   HOME EXERCISE PROGRAM Last updated: 11/25/21 Access Code: [email or web address] URL: [email or web address] Date: 11/29/2021 Prepared by: [facility]  Exercises - Sit to Stand with Arms Crossed  - 1 x daily - 5 x weekly - 2 sets - 10 reps - Standing Tandem Balance with Counter Support  - 1 x daily - 5 x weekly - 2 sets - 30 sec hold - Mini Squat with Counter Support  - 1 x daily - 5 x weekly - 2 sets - 10 reps - Standing Hip Abduction with Resistance at Ankles and Counter Support  - 1  x daily - 5 x weekly - 2 sets - 10 reps - Standing Hip Extension with Resistance at Ankles and Counter Support  - 1 x daily - 5 x weekly - 2 sets - 10 reps    Below measures were taken at time of initial evaluation unless otherwise specified:   DIAGNOSTIC FINDINGS: no mass effect  COGNITION: Overall cognitive status: Impaired, short-term memory    SENSATION: WFL  COORDINATION: WFL  EDEMA:    MUSCLE TONE: WNL    POSTURE: increased thoracic kyphosis and scoliosis?  LOWER EXTREMITY ROM:     WFL   LOWER EXTREMITY MMT:    3+/5 gross BLE strength for resisted tests in sitting  BED MOBILITY:  independent  TRANSFERS: Assistive device utilized:  BUE   Sit to stand: Modified independence Stand to sit: Modified independence Chair to chair: Modified independence Floor:  DNT   STAIRS:  Level of Assistance: Modified independence  Stair Negotiation Technique: Step to Pattern Alternating Pattern  with Bilateral Rails  Number of Stairs: 10   Height of Stairs: 4-6"  Comments:   GAIT: Gait pattern: WFL Distance walked:  Assistive device utilized: Single point  cane Level of assistance: Modified independence Comments: deficits during turns   FUNCTIONAL TESTs:  5 times sit to stand: 17.91 with retro-LOB and pushing knees into mat table Timed up and go (TUG): 15.75 sec with walking stick; 14.19 sec without AD Berg Balance Scale: 43/45 indicating high risk for falls  PATIENT SURVEYS:    TODAY'S TREATMENT:  Assessment; HEP initiated   PATIENT EDUCATION: Education details: assessment findings, rationale of PT intervention Person educated: Patient and Spouse Education method: Explanation Education comprehension: verbalized understanding   HOME EXERCISE PROGRAM: Access Code: [email or web address] URL: [email or web address] Date: 11/25/2021 Prepared by: Sherlyn Lees  Exercises - Sit to Stand with Arms Crossed  - 1 x daily - 7 x weekly - 3 sets - 5 reps -  Standing Tandem Balance with Counter Support  - 1 x daily - 7 x weekly - 3 sets - 15-30 sec hold    GOALS: Goals reviewed with patient? Yes  SHORT TERM GOALS: Target date: 12/09/2021  Patient will be independent in HEP to improve functional outcomes Baseline: Goal status: IN PROGRESS    LONG TERM GOALS: Target date: 12/23/2021  Demonstrate improved BLE strength and balance as evidenced by time of <15 sec 5xSTS test to reduce risk for falls Baseline: 17.91 18" seat height, several LOB, compensation with legs against table Goal status: IN PROGRESS  2.  Manifest low risk for falls per score 50/56 Berg Balance Test to improve safety with mobility and ADL Baseline: 43/56 Goal status: IN PROGRESS  3.  Reduce risk for falls per TUG test 12 sec Baseline: 14-17 sec Goal status: IN PROGRESS  4.  Demonstrate improved safety, balance, strength, and activity tolerance as evidenced by ability to carry heavy item up/down 12 steps to improve safety with access to beach house Baseline: unable Goal status: IN PROGRESS    ASSESSMENT:  CLINICAL IMPRESSION: Initiated tx with reactive balance strategies with pt and spouse for home practice, most practical strategy would be static standing and performing mild-mod postural perturbations standing by bedside. Continue with tx strategies to improve narrow BOS and single limb support to improve safety with stair ambulation and carrying items up/down stairs by improving step height and SLS stability. Pt demonstrates multiple LOB with narrow BOS, with head turns, and retro-walking with present but delayed righting reactions. Continued sessions indicated to improve general strength, dynamic balance, and facilitate righting reactions to reduce risk for falls. Will perfrom re-assessment and recertification at next sessions   OBJECTIVE IMPAIRMENTS cardiopulmonary status limiting activity, decreased activity tolerance, decreased balance, decreased cognition,  decreased endurance, decreased mobility, difficulty walking, decreased strength, and postural dysfunction.   ACTIVITY LIMITATIONS carrying, lifting, standing, squatting, stairs, transfers, and locomotion level  PARTICIPATION LIMITATIONS: meal prep, cleaning, laundry, community activity, and access to vacation home  PERSONAL FACTORS Age, Time since onset of injury/illness/exacerbation, and 1-2 comorbidities: A-fib, visual impairment  are also affecting patient's functional outcome.   REHAB POTENTIAL: Good  CLINICAL DECISION MAKING: Evolving/moderate complexity  EVALUATION COMPLEXITY: Moderate  PLAN: PT FREQUENCY: 1-2x/week  PT DURATION: 4 weeks  PLANNED INTERVENTIONS: Therapeutic exercises, Therapeutic activity, Neuromuscular re-education, Balance training, Gait training, Patient/Family education, Self Care, Joint mobilization, Stair training, Vestibular training, Canalith repositioning, Orthotic/Fit training, DME instructions, Aquatic Therapy, Dry Needling, Cryotherapy, Moist heat, and Manual therapy  PLAN FOR NEXT SESSION: re-assessment and recertification  5:85 AM, 12/15/21 M. Sherlyn Lees, PT, DPT Physical Therapist- [place] Office Number: [phone]   [place] at [facility] [place], [place] [address] Phone # [phone] Fax # [phone]

## 2021-12-19 ENCOUNTER — Other Ambulatory Visit: Payer: Self-pay | Admitting: Cardiology

## 2021-12-19 DIAGNOSIS — N183 Chronic kidney disease, stage 3 unspecified: Secondary | ICD-10-CM

## 2021-12-19 DIAGNOSIS — I4819 Other persistent atrial fibrillation: Secondary | ICD-10-CM

## 2021-12-19 DIAGNOSIS — I10 Essential (primary) hypertension: Secondary | ICD-10-CM

## 2021-12-20 ENCOUNTER — Ambulatory Visit: Payer: Medicare PPO

## 2021-12-20 DIAGNOSIS — R262 Difficulty in walking, not elsewhere classified: Secondary | ICD-10-CM | POA: Diagnosis not present

## 2021-12-20 DIAGNOSIS — M6281 Muscle weakness (generalized): Secondary | ICD-10-CM | POA: Diagnosis not present

## 2021-12-20 DIAGNOSIS — H5410 Blindness, one eye, low vision other eye, unspecified eyes: Secondary | ICD-10-CM | POA: Diagnosis not present

## 2021-12-20 DIAGNOSIS — R2681 Unsteadiness on feet: Secondary | ICD-10-CM | POA: Diagnosis not present

## 2021-12-20 NOTE — Therapy (Signed)
OUTPATIENT PHYSICAL THERAPY NEURO TREATMENT and Recertification   Patient Name: Austin [name] MRN: [id number] DOB:09-12-1930, 88 y.o., male Today's Date: 12/20/2021   PCP: Burnard Bunting REFERRING PROVIDER: Lenoria Chime, MD    PT End of Session - 12/20/21 0847     Visit Number 8    Number of Visits 16    Date for PT Re-Evaluation 01/24/22    Authorization Type [employer]    Authorization Time Period approved 8 PT visits from 11/25/2021-12/23/2021    Authorization - Visit Number 8    Authorization - Number of Visits 8    Progress Note Due on Visit 18    PT Start Time 0845    PT Stop Time 0930    PT Time Calculation (min) 45 min    Equipment Utilized During Treatment Gait belt    Activity Tolerance Patient tolerated treatment well    Behavior During Therapy WFL for tasks assessed/performed              Past Medical History:  Diagnosis Date   Allergy    Asthma    BPH (benign prostatic hypertrophy)    CKD (chronic kidney disease) stage 3, GFR 30-59 ml/min (HCC)    Cough    Gout    Hypertensive chronic kidney disease    Melanoma in situ of right eyelid ([place])    OSA (obstructive sleep apnea)    Persistent atrial fibrillation ([place])    Past Surgical History:  Procedure Laterality Date   CARDIOVERSION [name] 06/05/2015   Procedure: CARDIOVERSION;  Surgeon: Sanda Klein, MD;  Location: [facility] ENDOSCOPY;  Service: Cardiovascular;  Laterality: [name];   CARDIOVERSION [name] 01/04/2021   Procedure: CARDIOVERSION;  Surgeon: [name], MD;  Location: [facility] ENDOSCOPY;  Service: Cardiovascular;  Laterality: [name];   CARDIOVERSION [name] 02/08/2021   Procedure: CARDIOVERSION;  Surgeon: Jerline Pain, MD;  Location: [facility] ENDOSCOPY;  Service: Cardiovascular;  Laterality: [name];   CARDIOVERSION [name] 04/14/2021   Procedure: CARDIOVERSION;  Surgeon: Freada Bergeron, MD;  Location: [facility] ENDOSCOPY;  Service: Cardiovascular;  Laterality: [name];   CARDIOVERSION [name] 07/05/2021    Procedure: CARDIOVERSION;  Surgeon: Fay Records, MD;  Location: [facility] ENDOSCOPY;  Service: Cardiovascular;  Laterality: [name];   EYE SURGERY  12/2017   Patient Active Problem List   Diagnosis Date Noted   Blind right eye 11/16/2021   Absent eyeball 11/16/2021   Subdural hematoma ([place]) 11/15/2021   (HFpEF) heart failure with preserved ejection fraction ([place]) 11/15/2021   Anemia 11/15/2021   Hematuria 11/15/2021   Dyspnea on exertion 09/19/2019   Atrial fibrillation and flutter ([place]) 03/08/2018   OSA on CPAP 03/08/2018   Atrial fibrillation, persistent ([place]) 08/10/2015   Atrial fibrillation ([place]) 07/29/2015   CPAP use counseling 07/29/2015   OSA and COPD overlap syndrome ([place]) 07/29/2015   Essential hypertension 06/22/2015   CKD (chronic kidney disease) 06/22/2015   Persistent atrial fibrillation ([place])    Gasping for breath 03/19/2015   Snoring 03/19/2015   Asthma, mild persistent 03/19/2015   COPD exacerbation ([place]) 03/19/2015   FUO (fever of unknown origin) 03/19/2013   Melanoma in situ of right eyelid ([place])    Neurotrophic keratoconjunctivitis 06/01/2011   Malignant neoplasm of conjunctiva ([place]) 03/02/2011   Trichiasis of eyelid 03/02/2011    ONSET DATE: 11/15/21  REFERRING DIAG: W23.5XAA (ICD-10-CM) - Subdural hematoma (HCC)   THERAPY DIAG:  Unsteadiness on feet  Muscle weakness (generalized)  Difficulty in walking, not elsewhere classified  Rationale for Evaluation and  Treatment Rehabilitation  SUBJECTIVE:                                                                                                                                                                                              SUBJECTIVE STATEMENT: Feeling a little out of sorts today, blood pressure was a little low Pt accompanied by: significant other  PERTINENT HISTORY: history of A fib on Xarelto, HFpEF, Asthma, CKD 3, and melanoma of the R eye s/p enucleation who presented with fall at home and  subdural hematoma without mass effec   PAIN:  Are you having pain? No  PRECAUTIONS: Falls  WEIGHT BEARING RESTRICTIONS No  PLOF: Independent and Independent with basic ADLs  PATIENT GOALS improve balance, be able to walk up steps while carrying things   OBJECTIVE:   TODAY'S TREATMENT: 12/20/21 Activity Comments  5xSTS 14.23  Berg Balance 48/56   TUG test 14 sec w/out AD  Stair ambulation Carrying 4# grocery bag single HR x 12 steps   LAQ 2.5#  1x15  Standing hip extension 2.5# 1x15   Sidestepping x 2 min 2.5#                  PATIENT EDUCATION: Education details: HEP update; edu on how limited vision can affect balance Person educated: Patient Education method: Explanation, Demonstration, Tactile cues, Verbal cues, and Handouts Education comprehension: verbalized understanding and returned demonstration   HOME EXERCISE PROGRAM Last updated: 11/25/21 Access Code: [email or web address] URL: [email or web address] Date: 11/29/2021 Prepared by: [facility]  Exercises - Sit to Stand with Arms Crossed  - 1 x daily - 5 x weekly - 2 sets - 10 reps - Standing Tandem Balance with Counter Support  - 1 x daily - 5 x weekly - 2 sets - 30 sec hold - Mini Squat with Counter Support  - 1 x daily - 5 x weekly - 2 sets - 10 reps - Standing Hip Abduction with Resistance at Ankles and Counter Support  - 1 x daily - 5 x weekly - 2 sets - 10 reps - Standing Hip Extension with Resistance at Ankles and Counter Support  - 1 x daily - 5 x weekly - 2 sets - 10 reps -Sidestepping at counter x 2 min with weights -LAQ with weights 3x10   Below measures were taken at time of initial evaluation unless otherwise specified:   DIAGNOSTIC FINDINGS: no mass effect  COGNITION: Overall cognitive status: Impaired, short-term memory    SENSATION: WFL  COORDINATION: WFL  EDEMA:    MUSCLE TONE: WNL    POSTURE:  increased thoracic kyphosis and  scoliosis?  LOWER EXTREMITY ROM:     WFL   LOWER EXTREMITY MMT:    3+/5 gross BLE strength for resisted tests in sitting  BED MOBILITY:  independent  TRANSFERS: Assistive device utilized:  BUE   Sit to stand: Modified independence Stand to sit: Modified independence Chair to chair: Modified independence Floor:  DNT   STAIRS:  Level of Assistance: Modified independence  Stair Negotiation Technique: Step to Pattern Alternating Pattern  with Bilateral Rails  Number of Stairs: 10   Height of Stairs: 4-6"  Comments:   GAIT: Gait pattern: WFL Distance walked:  Assistive device utilized: Single point cane Level of assistance: Modified independence Comments: deficits during turns   FUNCTIONAL TESTs:  5 times sit to stand: 17.91 with retro-LOB and pushing knees into mat table Timed up and go (TUG): 15.75 sec with walking stick; 14.19 sec without AD Berg Balance Scale: 43/45 indicating high risk for falls  PATIENT SURVEYS:    TODAY'S TREATMENT:  Assessment; HEP initiated   PATIENT EDUCATION: Education details: assessment findings, rationale of PT intervention Person educated: Patient and Spouse Education method: Explanation Education comprehension: verbalized understanding      GOALS: Goals reviewed with patient? Yes  SHORT TERM GOALS: Target date: 01/10/22  Patient will be independent in HEP to improve functional outcomes Baseline: Goal status: IN PROGRESS    LONG TERM GOALS: Target date: 01/24/22  Demonstrate improved BLE strength and balance as evidenced by time of <15 sec 5xSTS test to reduce risk for falls Baseline: 17.91 18" seat height, several LOB, compensation with legs against table. 18" seat 14.23 sec without compensation Goal status: MET  2.  Manifest low risk for falls per score 50/56 Berg Balance Test to improve safety with mobility and ADL Baseline: 43/56; 48/56 (12/20/21) Goal status: IN PROGRESS  3.  Reduce risk for falls per TUG  test 12 sec Baseline: 14-17 sec; 14 sec (12/20/21) Goal status: IN PROGRESS  4.  Demonstrate improved safety, balance, strength, and activity tolerance as evidenced by ability to carry heavy item up/down 12 steps to improve safety with access to beach house Baseline: unable; modified indep carrying 4 lbs grocery bag Goal status: IN PROGRESS    ASSESSMENT:  CLINICAL IMPRESSION: STG/LTG reviewed and assessed with pt meeting 5xSTS requirement with decrease in LE compensation meaning improved BLE functional strength.  Overall improved performance in [employer] as well from high risk for falls 43/56 initial score to 48/56 at present progressing with activities of narrow BOS and single limb support most notably.  Patient continues to experience activity tolerance deficits related to generalized weakness as well as likely issue of comorbid a-fib also causing some issue.  Patient would benefit from continued PT sessions to refine HEP, improve dynamic balance, and increase independence/safety with stair ambulation whilst carrying items to meet patient-specific mobility goals.     OBJECTIVE IMPAIRMENTS cardiopulmonary status limiting activity, decreased activity tolerance, decreased balance, decreased cognition, decreased endurance, decreased mobility, difficulty walking, decreased strength, and postural dysfunction.   ACTIVITY LIMITATIONS carrying, lifting, standing, squatting, stairs, transfers, and locomotion level  PARTICIPATION LIMITATIONS: meal prep, cleaning, laundry, community activity, and access to vacation home  PERSONAL FACTORS Age, Time since onset of injury/illness/exacerbation, and 1-2 comorbidities: A-fib, visual impairment  are also affecting patient's functional outcome.   REHAB POTENTIAL: Good  CLINICAL DECISION MAKING: Evolving/moderate complexity  EVALUATION COMPLEXITY: Moderate  PLAN: PT FREQUENCY: 1-2x/week  PT DURATION: 4 weeks  PLANNED INTERVENTIONS: Therapeutic  exercises, Therapeutic activity, Neuromuscular re-education, Balance training, Gait training, Patient/Family education, Self Care, Joint mobilization, Stair training, Vestibular training, Canalith repositioning, Orthotic/Fit training, DME instructions, Aquatic Therapy, Dry Needling, Cryotherapy, Moist heat, and Manual therapy  PLAN FOR NEXT SESSION: HEP review, emphasis on LE strengthening with ankle weights  9:47 AM, 12/20/21 M. Sherlyn Lees, PT, DPT Physical Therapist- [place] Office Number: [phone]   [place] at [facility] [address], [place], [place] [address] Phone # [phone] Fax # [phone]

## 2021-12-21 ENCOUNTER — Ambulatory Visit (HOSPITAL_BASED_OUTPATIENT_CLINIC_OR_DEPARTMENT_OTHER): Payer: Medicare PPO | Admitting: Physical Therapy

## 2021-12-22 ENCOUNTER — Ambulatory Visit: Payer: Medicare PPO

## 2021-12-22 DIAGNOSIS — M6281 Muscle weakness (generalized): Secondary | ICD-10-CM

## 2021-12-22 DIAGNOSIS — R262 Difficulty in walking, not elsewhere classified: Secondary | ICD-10-CM

## 2021-12-22 DIAGNOSIS — R2681 Unsteadiness on feet: Secondary | ICD-10-CM | POA: Diagnosis not present

## 2021-12-22 DIAGNOSIS — H5410 Blindness, one eye, low vision other eye, unspecified eyes: Secondary | ICD-10-CM | POA: Diagnosis not present

## 2021-12-22 NOTE — Therapy (Signed)
OUTPATIENT PHYSICAL THERAPY NEURO TREATMENT and Recertification   Patient Name: Austin [name] MRN: [id number] DOB:08/27/1930, 88 y.o., male Today's Date: 12/22/2021   PCP: Burnard Bunting REFERRING PROVIDER: Lenoria Chime, MD    PT End of Session - 12/22/21 0847     Visit Number 9    Number of Visits 16    Date for PT Re-Evaluation 01/24/22    Authorization Type [employer]    Authorization Time Period approved 8 PT visits from 8/9-9/11/23    Authorization - Visit Number 9    Authorization - Number of Visits 16    Progress Note Due on Visit 18    PT Start Time 0845    PT Stop Time 0930    PT Time Calculation (min) 45 min    Equipment Utilized During Treatment Gait belt    Activity Tolerance Patient tolerated treatment well    Behavior During Therapy WFL for tasks assessed/performed              Past Medical History:  Diagnosis Date   Allergy    Asthma    BPH (benign prostatic hypertrophy)    CKD (chronic kidney disease) stage 3, GFR 30-59 ml/min (HCC)    Cough    Gout    Hypertensive chronic kidney disease    Melanoma in situ of right eyelid ([place])    OSA (obstructive sleep apnea)    Persistent atrial fibrillation ([place])    Past Surgical History:  Procedure Laterality Date   CARDIOVERSION [name] 06/05/2015   Procedure: CARDIOVERSION;  Surgeon: Sanda Klein, MD;  Location: [facility] ENDOSCOPY;  Service: Cardiovascular;  Laterality: [name];   CARDIOVERSION [name] 01/04/2021   Procedure: CARDIOVERSION;  Surgeon: [name], MD;  Location: [facility] ENDOSCOPY;  Service: Cardiovascular;  Laterality: [name];   CARDIOVERSION [name] 02/08/2021   Procedure: CARDIOVERSION;  Surgeon: Jerline Pain, MD;  Location: [facility] ENDOSCOPY;  Service: Cardiovascular;  Laterality: [name];   CARDIOVERSION [name] 04/14/2021   Procedure: CARDIOVERSION;  Surgeon: Freada Bergeron, MD;  Location: [facility] ENDOSCOPY;  Service: Cardiovascular;  Laterality: [name];   CARDIOVERSION [name] 07/05/2021   Procedure:  CARDIOVERSION;  Surgeon: Fay Records, MD;  Location: [facility] ENDOSCOPY;  Service: Cardiovascular;  Laterality: [name];   EYE SURGERY  12/2017   Patient Active Problem List   Diagnosis Date Noted   Blind right eye 11/16/2021   Absent eyeball 11/16/2021   Subdural hematoma ([place]) 11/15/2021   (HFpEF) heart failure with preserved ejection fraction ([place]) 11/15/2021   Anemia 11/15/2021   Hematuria 11/15/2021   Dyspnea on exertion 09/19/2019   Atrial fibrillation and flutter ([place]) 03/08/2018   OSA on CPAP 03/08/2018   Atrial fibrillation, persistent ([place]) 08/10/2015   Atrial fibrillation ([place]) 07/29/2015   CPAP use counseling 07/29/2015   OSA and COPD overlap syndrome ([place]) 07/29/2015   Essential hypertension 06/22/2015   CKD (chronic kidney disease) 06/22/2015   Persistent atrial fibrillation ([place])    Gasping for breath 03/19/2015   Snoring 03/19/2015   Asthma, mild persistent 03/19/2015   COPD exacerbation ([place]) 03/19/2015   FUO (fever of unknown origin) 03/19/2013   Melanoma in situ of right eyelid ([place])    Neurotrophic keratoconjunctivitis 06/01/2011   Malignant neoplasm of conjunctiva ([place]) 03/02/2011   Trichiasis of eyelid 03/02/2011    ONSET DATE: 11/15/21  REFERRING DIAG: H85.5XAA (ICD-10-CM) - Subdural hematoma (HCC)   THERAPY DIAG:  Unsteadiness on feet  Muscle weakness (generalized)  Difficulty in walking, not elsewhere classified  Rationale for Evaluation and  Treatment Rehabilitation  SUBJECTIVE:                                                                                                                                                                                              SUBJECTIVE STATEMENT: Feeling a little out of sorts today, blood pressure was a little low Pt accompanied by: significant other  PERTINENT HISTORY: history of A fib on Xarelto, HFpEF, Asthma, CKD 3, and melanoma of the R eye s/p enucleation who presented with fall at home and subdural  hematoma without mass effec   PAIN:  Are you having pain? No  PRECAUTIONS: Falls  WEIGHT BEARING RESTRICTIONS No  PLOF: Independent and Independent with basic ADLs  PATIENT GOALS improve balance, be able to walk up steps while carrying things   OBJECTIVE:  111/69 mmHg, 65 bpm at start 130/71 mmHg, 71 bpm w/ heavy exertion  TODAY'S TREATMENT: 12/22/21 Activity Comments  LAQ 5# 3x10   Alt stair taps 5# ankle weights 6" step 1x10, 1x10 with 10# bell in right/left hand  Lift-offs from 6" step 5# ankle 1x10, 1x10 with 10# bell in right/left hand  Step-ups contralat 10# dumbell hold 2x10 6" box LLE,  2x10 4" box RLE  Foot on step 6" Head turns 5x EC x 15 sec  Tandem stance 6 attempts ea set-up, 25 sec best  Walking march unilat 10# carry 2x20 ft    TODAY'S TREATMENT: 12/20/21 Activity Comments  5xSTS 14.23  Berg Balance 48/56   TUG test 14 sec w/out AD  Stair ambulation Carrying 4# grocery bag single HR x 12 steps   LAQ 2.5#  1x15  Standing hip extension 2.5# 1x15   Sidestepping x 2 min 2.5#                  PATIENT EDUCATION: Education details: HEP update; edu on how limited vision can affect balance Person educated: Patient Education method: Explanation, Demonstration, Tactile cues, Verbal cues, and Handouts Education comprehension: verbalized understanding and returned demonstration   HOME EXERCISE PROGRAM Last updated: 11/25/21 Access Code: [email or web address] URL: [email or web address] Date: 11/29/2021 Prepared by: [facility] - Outpatient  Rehab - [facility]  Exercises - Sit to Stand with Arms Crossed  - 1 x daily - 5 x weekly - 2 sets - 10 reps - Standing Tandem Balance with Counter Support  - 1 x daily - 5 x weekly - 2 sets - 30 sec hold - Mini Squat with Counter Support  - 1 x daily - 5 x weekly - 2 sets - 10 reps - Standing Hip Abduction with Resistance at Ankles and Counter   Support  - 1 x daily - 5 x weekly - 2 sets - 10 reps - Standing Hip  Extension with Resistance at Ankles and Counter Support  - 1 x daily - 5 x weekly - 2 sets - 10 reps -Sidestepping at counter x 2 min with weights -LAQ with weights 3x10   Below measures were taken at time of initial evaluation unless otherwise specified:   DIAGNOSTIC FINDINGS: no mass effect  COGNITION: Overall cognitive status: Impaired, short-term memory    SENSATION: WFL  COORDINATION: WFL  EDEMA:    MUSCLE TONE: WNL    POSTURE: increased thoracic kyphosis and scoliosis?  LOWER EXTREMITY ROM:     WFL   LOWER EXTREMITY MMT:    3+/5 gross BLE strength for resisted tests in sitting  BED MOBILITY:  independent  TRANSFERS: Assistive device utilized:  BUE   Sit to stand: Modified independence Stand to sit: Modified independence Chair to chair: Modified independence Floor:  DNT   STAIRS:  Level of Assistance: Modified independence  Stair Negotiation Technique: Step to Pattern Alternating Pattern  with Bilateral Rails  Number of Stairs: 10   Height of Stairs: 4-6"  Comments:   GAIT: Gait pattern: WFL Distance walked:  Assistive device utilized: Single point cane Level of assistance: Modified independence Comments: deficits during turns   FUNCTIONAL TESTs:  5 times sit to stand: 17.91 with retro-LOB and pushing knees into mat table Timed up and go (TUG): 15.75 sec with walking stick; 14.19 sec without AD Berg Balance Scale: 43/45 indicating high risk for falls  PATIENT SURVEYS:    TODAY'S TREATMENT:  Assessment; HEP initiated   PATIENT EDUCATION: Education details: assessment findings, rationale of PT intervention Person educated: Patient and Spouse Education method: Explanation Education comprehension: verbalized understanding      GOALS: Goals reviewed with patient? Yes  SHORT TERM GOALS: Target date: 01/10/22  Patient will be independent in HEP to improve functional outcomes Baseline: Goal status: IN PROGRESS    LONG TERM  GOALS: Target date: 01/24/22  Demonstrate improved BLE strength and balance as evidenced by time of <15 sec 5xSTS test to reduce risk for falls Baseline: 17.91 18" seat height, several LOB, compensation with legs against table. 18" seat 14.23 sec without compensation Goal status: MET  2.  Manifest low risk for falls per score 50/56 Berg Balance Test to improve safety with mobility and ADL Baseline: 43/56; 48/56 (12/20/21) Goal status: IN PROGRESS  3.  Reduce risk for falls per TUG test 12 sec Baseline: 14-17 sec; 14 sec (12/20/21) Goal status: IN PROGRESS  4.  Demonstrate improved safety, balance, strength, and activity tolerance as evidenced by ability to carry heavy item up/down 12 steps to improve safety with access to beach house Baseline: unable; modified indep carrying 4 lbs grocery bag Goal status: IN PROGRESS    ASSESSMENT:  CLINICAL IMPRESSION: RLE weakness manifest with difficulty in ascending 6" rise requiring performance using 4" step.  Emphasis on tx in improving stair negotiation by means of various single limb support activities and functional strengthening to increase safety with carrying items up/down stairs of beach house per specified patient goals.  Continued sessions to enhance dynamic balance and improve BLE strength to facilitate balance/righting reactions.    OBJECTIVE IMPAIRMENTS cardiopulmonary status limiting activity, decreased activity tolerance, decreased balance, decreased cognition, decreased endurance, decreased mobility, difficulty walking, decreased strength, and postural dysfunction.   ACTIVITY LIMITATIONS carrying, lifting, standing, squatting, stairs, transfers, and locomotion level  PARTICIPATION LIMITATIONS: meal prep, cleaning,  laundry, community activity, and access to vacation home  [place] Age, Time since onset of injury/illness/exacerbation, and 1-2 comorbidities: A-fib, visual impairment  are also affecting patient's functional  outcome.   REHAB POTENTIAL: Good  CLINICAL DECISION MAKING: Evolving/moderate complexity  EVALUATION COMPLEXITY: Moderate  PLAN: PT FREQUENCY: 1-2x/week  PT DURATION: 4 weeks  PLANNED INTERVENTIONS: Therapeutic exercises, Therapeutic activity, Neuromuscular re-education, Balance training, Gait training, Patient/Family education, Self Care, Joint mobilization, Stair training, Vestibular training, Canalith repositioning, Orthotic/Fit training, DME instructions, Aquatic Therapy, Dry Needling, Cryotherapy, Moist heat, and Manual therapy  PLAN FOR NEXT SESSION: Continue with this session's activities to hopefully improve RLE strength for managing 6" rise step  8:48 AM, 12/22/21 M. Sherlyn Lees, PT, DPT Physical Therapist- [place] Office Number: [phone]   [place] at [facility] [address] [place], [place] [address] Phone # [phone] Fax # [phone]

## 2021-12-24 NOTE — Therapy (Signed)
OUTPATIENT PHYSICAL THERAPY PROGRESS NOTE   Patient Name: Austin [name] MRN: [id number] DOB:04-05-1931, 88 y.o., male Today's Date: 12/27/2021   Progress Note Reporting Period 11/25/21 to 12/27/21  See note below for Objective Data and Assessment of Progress/Goals.      PCP: Burnard Bunting REFERRING PROVIDER: Lenoria Chime, MD    PT End of Session - 12/27/21 1112     Visit Number 10    Number of Visits 16    Date for PT Re-Evaluation 01/24/22    Authorization Type [employer]    Authorization Time Period approved 8 PT visits from 8/9-9/11/23    Authorization - Visit Number 10    Authorization - Number of Visits 16    Progress Note Due on Visit 18    PT Start Time 0932    PT Stop Time 1012    PT Time Calculation (min) 40 min    Equipment Utilized During Treatment Gait belt    Activity Tolerance Patient tolerated treatment well    Behavior During Therapy WFL for tasks assessed/performed               Past Medical History:  Diagnosis Date   Allergy    Asthma    BPH (benign prostatic hypertrophy)    CKD (chronic kidney disease) stage 3, GFR 30-59 ml/min (HCC)    Cough    Gout    Hypertensive chronic kidney disease    Melanoma in situ of right eyelid (HCC)    OSA (obstructive sleep apnea)    Persistent atrial fibrillation ([place])    Past Surgical History:  Procedure Laterality Date   CARDIOVERSION [name] 06/05/2015   Procedure: CARDIOVERSION;  Surgeon: Sanda Klein, MD;  Location: [place];  Service: Cardiovascular;  Laterality: [name];   CARDIOVERSION [name] 01/04/2021   Procedure: CARDIOVERSION;  Surgeon: [name], MD;  Location: [facility] ENDOSCOPY;  Service: Cardiovascular;  Laterality: [name];   CARDIOVERSION [name] 02/08/2021   Procedure: CARDIOVERSION;  Surgeon: Jerline Pain, MD;  Location: [facility] ENDOSCOPY;  Service: Cardiovascular;  Laterality: [name];   CARDIOVERSION [name] 04/14/2021   Procedure: CARDIOVERSION;  Surgeon: Freada Bergeron, MD;   Location: [facility] ENDOSCOPY;  Service: Cardiovascular;  Laterality: [name];   CARDIOVERSION [name] 07/05/2021   Procedure: CARDIOVERSION;  Surgeon: Fay Records, MD;  Location: [place];  Service: Cardiovascular;  Laterality: [name];   EYE SURGERY  12/2017   Patient Active Problem List   Diagnosis Date Noted   Blind right eye 11/16/2021   Absent eyeball 11/16/2021   Subdural hematoma ([place]) 11/15/2021   (HFpEF) heart failure with preserved ejection fraction ([place]) 11/15/2021   Anemia 11/15/2021   Hematuria 11/15/2021   Dyspnea on exertion 09/19/2019   Atrial fibrillation and flutter ([place]) 03/08/2018   OSA on CPAP 03/08/2018   Atrial fibrillation, persistent ([place]) 08/10/2015   Atrial fibrillation ([place]) 07/29/2015   CPAP use counseling 07/29/2015   OSA and COPD overlap syndrome ([place]) 07/29/2015   Essential hypertension 06/22/2015   CKD (chronic kidney disease) 06/22/2015   Persistent atrial fibrillation ([place])    Gasping for breath 03/19/2015   Snoring 03/19/2015   Asthma, mild persistent 03/19/2015   COPD exacerbation ([place]) 03/19/2015   FUO (fever of unknown origin) 03/19/2013   Melanoma in situ of right eyelid ([place])    Neurotrophic keratoconjunctivitis 06/01/2011   Malignant neoplasm of conjunctiva ([place]) 03/02/2011   Trichiasis of eyelid 03/02/2011    ONSET DATE: 11/15/21  REFERRING DIAG: L93.5XAA (ICD-10-CM) - Subdural hematoma (HCC)  THERAPY DIAG:  Unsteadiness on feet  Muscle weakness (generalized)  Difficulty in walking, not elsewhere classified  Rationale for Evaluation and Treatment Rehabilitation  SUBJECTIVE:                                                                                                                                                                                              SUBJECTIVE STATEMENT: Knees are heeling slowly since his fall about a week about. Dog pulled him down. Reports that he uses a walking stick when he goes out by himself; cane to  church.  Reports that his depth perception contributes to his balance being off.  Pt accompanied by: significant other  PERTINENT HISTORY: history of A fib on Xarelto, HFpEF, Asthma, CKD 3, and melanoma of the R eye s/p enucleation who presented with fall at home and subdural hematoma without mass effec   PAIN:  Are you having pain? No  PRECAUTIONS: Falls  WEIGHT BEARING RESTRICTIONS No  PLOF: Independent and Independent with basic ADLs  PATIENT GOALS improve balance, be able to walk up steps while carrying things   OBJECTIVE:      TODAY'S TREATMENT: 12/27/21 Activity Comments  Nustep L 5 x 6 min Ues/LEs Maintaining 70SPM  Stair navigation Alt reciprocal with/without 1 handrail and carrying blue medball; poor depth perception leading to improper foot placement and imbalance without handrail; better safety/stability with 1 handrail       [facility] PT Assessment - 12/27/21 0001       Balance   Balance Assessed Yes      Standardized Balance Assessment   Standardized Balance Assessment Timed Up and Go Test      Berg Balance Test   Sit to Stand Able to stand without using hands and stabilize independently    Standing Unsupported Able to stand safely 2 minutes    Sitting with Back Unsupported but Feet Supported on Floor or Stool Able to sit safely and securely 2 minutes    Stand to Sit Sits safely with minimal use of hands    Transfers Able to transfer safely, minor use of hands    Standing Unsupported with Eyes Closed Able to stand 10 seconds safely    Standing Unsupported with Feet Together Able to place feet together independently and stand 1 minute safely    From Standing, Reach Forward with Outstretched Arm Can reach forward >12 cm safely (5")    From Standing Position, Pick up Object from Floor Able to pick up shoe safely and easily    From Standing Position, Turn to Look Behind Over each Shoulder Looks behind one side only/other  side shows less weight shift    Turn 360  Degrees Able to turn 360 degrees safely in 4 seconds or less    Standing Unsupported, Alternately Place Feet on Step/Stool Able to stand independently and safely and complete 8 steps in 20 seconds    Standing Unsupported, One Foot in Front Able to take small step independently and hold 30 seconds   completed tandem balance for ~5 sec   Standing on One Leg Unable to try or needs assist to prevent fall    Total Score 48      Timed Up and Go Test   Normal TUG (seconds) --   14.59 sec, 10.68 sec some hesitation with turn              HOME EXERCISE PROGRAM Last updated: 12/27/21 Access Code: [email or web address] URL: [email or web address] Date: 12/27/2021 Prepared by: [facility]  Exercises - Sit to Stand with Arms Crossed  - 1 x daily - 5 x weekly - 2 sets - 10 reps - Standing Tandem Balance with Counter Support  - 1 x daily - 5 x weekly - 2 sets - 30 sec hold - Mini Squat with Counter Support  - 1 x daily - 5 x weekly - 2 sets - 10 reps - Standing Hip Abduction with Resistance at Ankles and Counter Support  - 1 x daily - 5 x weekly - 2 sets - 10 reps - Standing Hip Extension with Resistance at Ankles and Counter Support  - 1 x daily - 5 x weekly - 2 sets - 10 reps - Narrow Stance with Counter Support  - 1 x daily - 5 x weekly - 2-3 sets - 30 sec hold - Standing Toe Taps  - 1 x daily - 5 x weekly - 2 sets - 10 reps - Standing Balance with Eyes Closed  - 1 x daily - 5 x weekly - 2-3 sets - 30 sec hold   PATIENT EDUCATION: Education details: edu on current progress with PT and remaining impairments; HEP update; edu on methods for safer stair navigation  Person educated: Patient Education method: Explanation, Demonstration, Tactile cues, Verbal cues, and Handouts Education comprehension: verbalized understanding and returned demonstration    Below measures were taken at time of initial evaluation unless otherwise specified:   DIAGNOSTIC  FINDINGS: no mass effect  COGNITION: Overall cognitive status: Impaired, short-term memory    SENSATION: WFL  COORDINATION: WFL  EDEMA:    MUSCLE TONE: WNL    POSTURE: increased thoracic kyphosis and scoliosis?  LOWER EXTREMITY ROM:     WFL   LOWER EXTREMITY MMT:    3+/5 gross BLE strength for resisted tests in sitting  BED MOBILITY:  independent  TRANSFERS: Assistive device utilized:  BUE   Sit to stand: Modified independence Stand to sit: Modified independence Chair to chair: Modified independence Floor:  DNT   STAIRS:  Level of Assistance: Modified independence  Stair Negotiation Technique: Step to Pattern Alternating Pattern  with Bilateral Rails  Number of Stairs: 10   Height of Stairs: 4-6"  Comments:   GAIT: Gait pattern: WFL Distance walked:  Assistive device utilized: Single point cane Level of assistance: Modified independence Comments: deficits during turns   FUNCTIONAL TESTs:  5 times sit to stand: 17.91 with retro-LOB and pushing knees into mat table Timed up and go (TUG): 15.75 sec with walking stick; 14.19 sec without AD Berg Balance Scale: 43/45 indicating high risk for falls  PATIENT SURVEYS:    TODAY'S TREATMENT:  Assessment; HEP initiated   PATIENT EDUCATION: Education details: assessment findings, rationale of PT intervention Person educated: Patient and Spouse Education method: Explanation Education comprehension: verbalized understanding      GOALS: Goals reviewed with patient? Yes  SHORT TERM GOALS: Target date: 01/10/22  Patient will be independent in HEP to improve functional outcomes Baseline: Goal status: IN PROGRESS 12/27/21    LONG TERM GOALS: Target date: 01/24/22  Demonstrate improved BLE strength and balance as evidenced by time of <15 sec 5xSTS test to reduce risk for falls Baseline: 17.91 18" seat height, several LOB, compensation with legs against table. 18" seat 14.23 sec without  compensation Goal status: MET  2.  Manifest low risk for falls per score 50/56 Berg Balance Test to improve safety with mobility and ADL Baseline: 43/56; 48/56 (12/20/21); 48/56 (12/27/21) Goal status: IN PROGRESS  3.  Reduce risk for falls per TUG test 12 sec Baseline: 14-17 sec; 14 sec (12/20/21); 10.68 sec (12/27/21) Goal status: MET 12/27/21  4.  Demonstrate improved safety, balance, strength, and activity tolerance as evidenced by ability to carry heavy item up/down 12 steps to improve safety with access to beach house Baseline: unable; modified indep carrying 4 lbs grocery bag; able to carry blue medball and use 1 handrail safely 12/27/21 Goal status: IN PROGRESS    ASSESSMENT:  CLINICAL IMPRESSION: Patient arrived to session with report of a fall a week ago from his dog pulling him down; B knees bandaged today. Patient reports continued concerns about his depth perception d/t his vision; mentions some degeneration in his remaining eye as well. Patient scored 48/56 on Berg, indicating a decreased risk of falls but demonstrating most difficulty with SLS and narrow BOS activities. TUG goal was met, demonstrating decreased risk of falls. Patient requires 1 handrail for safe stair navigation, especially when holding an object. Educated patient on using backpack as another safe alternative to holding items in his hands while navigating stairs. Updated HEP to address remaining deficits; patient reported understanding of all edu and without complaints at end of session. Would benefit from continued skilled PT serviced to address remaining goals.    OBJECTIVE IMPAIRMENTS cardiopulmonary status limiting activity, decreased activity tolerance, decreased balance, decreased cognition, decreased endurance, decreased mobility, difficulty walking, decreased strength, and postural dysfunction.   ACTIVITY LIMITATIONS carrying, lifting, standing, squatting, stairs, transfers, and locomotion  level  PARTICIPATION LIMITATIONS: meal prep, cleaning, laundry, community activity, and access to vacation home  PERSONAL FACTORS Age, Time since onset of injury/illness/exacerbation, and 1-2 comorbidities: A-fib, visual impairment  are also affecting patient's functional outcome.   REHAB POTENTIAL: Good  CLINICAL DECISION MAKING: Evolving/moderate complexity  EVALUATION COMPLEXITY: Moderate  PLAN: PT FREQUENCY: 1-2x/week  PT DURATION: 4 weeks  PLANNED INTERVENTIONS: Therapeutic exercises, Therapeutic activity, Neuromuscular re-education, Balance training, Gait training, Patient/Family education, Self Care, Joint mobilization, Stair training, Vestibular training, Canalith repositioning, Orthotic/Fit training, DME instructions, Aquatic Therapy, Dry Needling, Cryotherapy, Moist heat, and Manual therapy  PLAN FOR NEXT SESSION: work on exercises with EC (d/t patient reporting issues with his good eye) and balance with narrow BOS, SLS; Continue with this session's activities to hopefully improve RLE strength for managing 6" rise step   Janene Harvey, PT, DPT 12/27/21 11:17 AM  [facility] at [facility] [address] [place], [place] [place] [address] Phone # [phone] Fax # [phone]

## 2021-12-27 ENCOUNTER — Encounter: Payer: Self-pay | Admitting: Physical Therapy

## 2021-12-27 ENCOUNTER — Ambulatory Visit: Payer: Medicare PPO | Admitting: Physical Therapy

## 2021-12-27 DIAGNOSIS — R2681 Unsteadiness on feet: Secondary | ICD-10-CM | POA: Diagnosis not present

## 2021-12-27 DIAGNOSIS — M6281 Muscle weakness (generalized): Secondary | ICD-10-CM | POA: Diagnosis not present

## 2021-12-27 DIAGNOSIS — R262 Difficulty in walking, not elsewhere classified: Secondary | ICD-10-CM

## 2021-12-27 DIAGNOSIS — H5410 Blindness, one eye, low vision other eye, unspecified eyes: Secondary | ICD-10-CM | POA: Diagnosis not present

## 2021-12-29 ENCOUNTER — Ambulatory Visit: Payer: Medicare PPO | Admitting: Physical Therapy

## 2021-12-30 ENCOUNTER — Ambulatory Visit: Payer: Medicare PPO

## 2021-12-30 ENCOUNTER — Encounter: Payer: Self-pay | Admitting: Cardiology

## 2021-12-30 DIAGNOSIS — R2681 Unsteadiness on feet: Secondary | ICD-10-CM

## 2021-12-30 DIAGNOSIS — M6281 Muscle weakness (generalized): Secondary | ICD-10-CM

## 2021-12-30 DIAGNOSIS — R262 Difficulty in walking, not elsewhere classified: Secondary | ICD-10-CM

## 2021-12-30 DIAGNOSIS — H5410 Blindness, one eye, low vision other eye, unspecified eyes: Secondary | ICD-10-CM | POA: Diagnosis not present

## 2021-12-30 NOTE — Therapy (Signed)
OUTPATIENT PHYSICAL THERAPY TREATMENT   Patient Name: Austin [name] MRN: [id number] DOB:[birth year], 88 y.o., male Today's Date: 12/30/2021  PCP: Burnard Bunting REFERRING PROVIDER: Lenoria Chime, MD    PT End of Session - 12/30/21 1105     Visit Number 11    Number of Visits 16    Date for PT Re-Evaluation 01/24/22    Authorization Type [employer]    Authorization Time Period approved 8 PT visits from 8/9-9/11/23    Authorization - Visit Number 11    Authorization - Number of Visits 16    Progress Note Due on Visit 18    PT Start Time 1100    PT Stop Time 1145    PT Time Calculation (min) 45 min    Equipment Utilized During Treatment Gait belt    Activity Tolerance Patient tolerated treatment well    Behavior During Therapy WFL for tasks assessed/performed               Past Medical History:  Diagnosis Date   Allergy    Asthma    BPH (benign prostatic hypertrophy)    CKD (chronic kidney disease) stage 3, GFR 30-59 ml/min (HCC)    Cough    Gout    Hypertensive chronic kidney disease    Melanoma in situ of right eyelid ([place])    OSA (obstructive sleep apnea)    Persistent atrial fibrillation ([place])    Past Surgical History:  Procedure Laterality Date   CARDIOVERSION [name] 06/05/2015   Procedure: CARDIOVERSION;  Surgeon: Sanda Klein, MD;  Location: [facility] ENDOSCOPY;  Service: Cardiovascular;  Laterality: [name];   CARDIOVERSION [name] 01/04/2021   Procedure: CARDIOVERSION;  Surgeon: [name], MD;  Location: [facility] ENDOSCOPY;  Service: Cardiovascular;  Laterality: [name];   CARDIOVERSION [name] 02/08/2021   Procedure: CARDIOVERSION;  Surgeon: Jerline Pain, MD;  Location: [facility] ENDOSCOPY;  Service: Cardiovascular;  Laterality: [name];   CARDIOVERSION [name] 04/14/2021   Procedure: CARDIOVERSION;  Surgeon: Freada Bergeron, MD;  Location: [facility] ENDOSCOPY;  Service: Cardiovascular;  Laterality: [name];   CARDIOVERSION [name] 07/05/2021   Procedure: CARDIOVERSION;  Surgeon: Fay Records, MD;  Location: [facility] ENDOSCOPY;  Service: Cardiovascular;  Laterality: [name];   EYE SURGERY  12/2017   Patient Active Problem List   Diagnosis Date Noted   Blind right eye 11/16/2021   Absent eyeball 11/16/2021   Subdural hematoma ([place]) 11/15/2021   (HFpEF) heart failure with preserved ejection fraction ([place]) 11/15/2021   Anemia 11/15/2021   Hematuria 11/15/2021   Dyspnea on exertion 09/19/2019   Atrial fibrillation and flutter ([place]) 03/08/2018   OSA on CPAP 03/08/2018   Atrial fibrillation, persistent ([place]) 08/10/2015   Atrial fibrillation ([place]) 07/29/2015   CPAP use counseling 07/29/2015   OSA and COPD overlap syndrome ([place]) 07/29/2015   Essential hypertension 06/22/2015   CKD (chronic kidney disease) 06/22/2015   Persistent atrial fibrillation ([place])    Gasping for breath 03/19/2015   Snoring 03/19/2015   Asthma, mild persistent 03/19/2015   COPD exacerbation ([place]) 03/19/2015   FUO (fever of unknown origin) 03/19/2013   Melanoma in situ of right eyelid ([place])    Neurotrophic keratoconjunctivitis 06/01/2011   Malignant neoplasm of conjunctiva ([place]) 03/02/2011   Trichiasis of eyelid 03/02/2011    ONSET DATE: 11/15/21  REFERRING DIAG: O97.5XAA (ICD-10-CM) - Subdural hematoma (HCC)   THERAPY DIAG:  Unsteadiness on feet  Muscle weakness (generalized)  Difficulty in walking, not elsewhere classified  Rationale for Evaluation and Treatment Rehabilitation  SUBJECTIVE:                                                                                                                                                                                              SUBJECTIVE STATEMENT: Knee is healing up slowly, overall feeling OK Pt accompanied by: significant other  PERTINENT HISTORY: history of A fib on Xarelto, HFpEF, Asthma, CKD 3, and melanoma of the R eye s/p enucleation who presented with fall at home and subdural hematoma without mass effec   PAIN:  Are you having  pain? No  PRECAUTIONS: Falls  WEIGHT BEARING RESTRICTIONS No  PLOF: Independent and Independent with basic ADLs  PATIENT GOALS improve balance, be able to walk up steps while carrying things   OBJECTIVE:     TODAY'S TREATMENT: 12/30/21 Activity Comments  NU-step level 5 x 5 min Cues for SPM 70+  Alt stair taps x 2 min Bottom step/top step 6" side  Stair lift-off 3x10 5# ankle, 10# unilat dumbell  LAQ 3x10 5#  5x5 sit-stand  10# dumbell, 20" seat height  Stair ambulation carrying 10# 12 trips up/over therapy staircase, single HR, alternating carrying  Balance on foam -postural perturbations -staggered stance -eyes closed        HOME EXERCISE PROGRAM Last updated: 12/27/21 Access Code: [email or web address] URL: [email or web address] Date: 12/27/2021 Prepared by: [facility]  Exercises - Sit to Stand with Arms Crossed  - 1 x daily - 5 x weekly - 2 sets - 10 reps - Standing Tandem Balance with Counter Support  - 1 x daily - 5 x weekly - 2 sets - 30 sec hold - Mini Squat with Counter Support  - 1 x daily - 5 x weekly - 2 sets - 10 reps - Standing Hip Abduction with Resistance at Ankles and Counter Support  - 1 x daily - 5 x weekly - 2 sets - 10 reps - Standing Hip Extension with Resistance at Ankles and Counter Support  - 1 x daily - 5 x weekly - 2 sets - 10 reps - Narrow Stance with Counter Support  - 1 x daily - 5 x weekly - 2-3 sets - 30 sec hold - Standing Toe Taps  - 1 x daily - 5 x weekly - 2 sets - 10 reps - Standing Balance with Eyes Closed  - 1 x daily - 5 x weekly - 2-3 sets - 30 sec hold   PATIENT EDUCATION: Education details: edu on current progress with PT and remaining impairments; HEP update; edu on methods for safer stair navigation  Person educated:  Patient Education method: Explanation, Demonstration, Tactile cues, Verbal cues, and Handouts Education comprehension: verbalized understanding and returned  demonstration    Below measures were taken at time of initial evaluation unless otherwise specified:   DIAGNOSTIC FINDINGS: no mass effect  COGNITION: Overall cognitive status: Impaired, short-term memory    SENSATION: WFL  COORDINATION: WFL  EDEMA:    MUSCLE TONE: WNL    POSTURE: increased thoracic kyphosis and scoliosis?  LOWER EXTREMITY ROM:     WFL   LOWER EXTREMITY MMT:    3+/5 gross BLE strength for resisted tests in sitting  BED MOBILITY:  independent  TRANSFERS: Assistive device utilized:  BUE   Sit to stand: Modified independence Stand to sit: Modified independence Chair to chair: Modified independence Floor:  DNT   STAIRS:  Level of Assistance: Modified independence  Stair Negotiation Technique: Step to Pattern Alternating Pattern  with Bilateral Rails  Number of Stairs: 10   Height of Stairs: 4-6"  Comments:   GAIT: Gait pattern: WFL Distance walked:  Assistive device utilized: Single point cane Level of assistance: Modified independence Comments: deficits during turns   FUNCTIONAL TESTs:  5 times sit to stand: 17.91 with retro-LOB and pushing knees into mat table Timed up and go (TUG): 15.75 sec with walking stick; 14.19 sec without AD Berg Balance Scale: 43/45 indicating high risk for falls  PATIENT SURVEYS:    TODAY'S TREATMENT:  Assessment; HEP initiated   PATIENT EDUCATION: Education details: assessment findings, rationale of PT intervention Person educated: Patient and Spouse Education method: Explanation Education comprehension: verbalized understanding      GOALS: Goals reviewed with patient? Yes  SHORT TERM GOALS: Target date: 01/10/22  Patient will be independent in HEP to improve functional outcomes Baseline: Goal status: IN PROGRESS 12/27/21    LONG TERM GOALS: Target date: 01/24/22  Demonstrate improved BLE strength and balance as evidenced by time of <15 sec 5xSTS test to reduce risk for  falls Baseline: 17.91 18" seat height, several LOB, compensation with legs against table. 18" seat 14.23 sec without compensation Goal status: MET  2.  Manifest low risk for falls per score 50/56 Berg Balance Test to improve safety with mobility and ADL Baseline: 43/56; 48/56 (12/20/21); 48/56 (12/27/21) Goal status: IN PROGRESS  3.  Reduce risk for falls per TUG test 12 sec Baseline: 14-17 sec; 14 sec (12/20/21); 10.68 sec (12/27/21) Goal status: MET 12/27/21  4.  Demonstrate improved safety, balance, strength, and activity tolerance as evidenced by ability to carry heavy item up/down 12 steps to improve safety with access to beach house Baseline: unable; modified indep carrying 4 lbs grocery bag; able to carry blue medball and use 1 handrail safely 12/27/21 Goal status: IN PROGRESS    ASSESSMENT:  CLINICAL IMPRESSION: Overall progressing with resistance exercises and activities for balance w/ emphasis on single limb support in order to improve safety and efficiency with stair ambulation to enable safe negotiation whilst carrying items/bags.  Use of various drills and activities to impose external demands to facilitate postural correction and emphasize hip/knee stability to meet these demands to reduce risk for falls by facilitation of righting reactions.  Pt reports he'd like to pursue two additional visits and D/C to HEP   OBJECTIVE IMPAIRMENTS cardiopulmonary status limiting activity, decreased activity tolerance, decreased balance, decreased cognition, decreased endurance, decreased mobility, difficulty walking, decreased strength, and postural dysfunction.   ACTIVITY LIMITATIONS carrying, lifting, standing, squatting, stairs, transfers, and locomotion level  PARTICIPATION LIMITATIONS: meal prep, cleaning, laundry, community activity,  and access to vacation home  PERSONAL FACTORS Age, Time since onset of injury/illness/exacerbation, and 1-2 comorbidities: A-fib, visual impairment  are  also affecting patient's functional outcome.   REHAB POTENTIAL: Good  CLINICAL DECISION MAKING: Evolving/moderate complexity  EVALUATION COMPLEXITY: Moderate  PLAN: PT FREQUENCY: 1-2x/week  PT DURATION: 4 weeks  PLANNED INTERVENTIONS: Therapeutic exercises, Therapeutic activity, Neuromuscular re-education, Balance training, Gait training, Patient/Family education, Self Care, Joint mobilization, Stair training, Vestibular training, Canalith repositioning, Orthotic/Fit training, DME instructions, Aquatic Therapy, Dry Needling, Cryotherapy, Moist heat, and Manual therapy  PLAN FOR NEXT SESSION: work on exercises with EC (d/t patient reporting issues with his good eye) and balance with narrow BOS, SLS; Continue with this session's activities to hopefully improve RLE strength for managing 6" rise step. Continue x 2 visits per pt request?   12:47 PM, 12/30/21 M. Sherlyn Lees, PT, DPT Physical Therapist- [place] Office Number: [phone]   [place] at [facility] [address], [place], [place] [address] Phone # [phone] Fax # [phone]

## 2022-01-01 NOTE — Progress Notes (Unsigned)
Cardiology Office Note Date:  01/01/2022  Patient ID:  Reinaldo, Helt 1931-05-03, MRN [id number] PCP:  Burnard Bunting, MD  Electrophysiologist: Dr. Curt Bears    Chief Complaint:  planned f/u  History of Present Illness: Austin [name] is a 88 y.o. male with history of melanoma R eye lid > loss of vision, COPD, sleep apnea, HTN, AFib, CKD (II)  He comes in today to be seen for Dr. Curt Bears, last seen by him 10/14/21, reported fatigue, felt he had been in normal rhythm, did not seem to connect his fatigue to the AF. Maintaining SR, coreg reduced to see if this would help his fatigue, if not could try Toprol.  Hospitalized 11/15/21 - 11/16/21 after a mechanical trip/fall and found with SDH, neurosurgery involved, f/u scan the following day with improvement and stable to discharge Recs to hold xarelto until 7/8 or 11/22/21. Also mentioned some degree of hematuria to follow up with urology, anemic with a steady trend downwards, negative stool, no UTI.  I saw him 11/23/21 He is accompanied by his wife. He has not had any new falls. He thinks when he was getting back in bed he got tripped up in his pajamas, blanket or something isn't sure, but was not a dizzy spell did not faint. He fell a month prior [place] the beach, also a stumble/fall, hit his head on the door jam, was seen in the ER at [place] was negative. He denies falling as frequent or problematic, his wife has concerns though, he is pending PT evaluation that has been arranged and his PMD follow up. No CP, palpitations, no cardiac awareness. He will start the xarelto back today He is weak, but feels like he is getting stronger His wife mentions the last time they saw GI, the discharged him from the practice, apparently 2/2 advanced age? He has not been known it seem to have any prior bleeding GI or urology in the past They will see PMD soon Planned to follow up in 6 weeks back on a/c.  TODAY He is accompanied  by hs wife His dog pulled him down, scraped his knee, no oher falls/injuries. He is seeing Dr. Markham Jordan, started on Flomax, planned for a cystoscopy in September, + microscopic hematuria No visible blood, no other bleeding or signs of bleeding He saw his PMD's NP, Hgb up to 9 and no changes made. No CP, palpitations or cardiac awareness. No near syncope or syncope. Doesn't think he would be able to tell if he had Afib, but has not had any symptoms. He is getting PT, but still feels like he wishes he had mor energy BP tends to be a little low. Strength though is better with therapy.  AFib/AAD Hx Diagnosed 2017 Failed flecainide and amiodarone (notes also report corneal deposits) Tiksoyn started March 2021 >>> Stopped Sept 2022 with recurrent AFib/flutter Multaq started though stopped quickly with new onset SOB Saw his eye MD and cleared to retry amiodarone, restarted  Jan 2023   Past Medical History:  Diagnosis Date   Allergy    Asthma    BPH (benign prostatic hypertrophy)    CKD (chronic kidney disease) stage 3, GFR 30-59 ml/min (HCC)    Cough    Gout    Hypertensive chronic kidney disease    Melanoma in situ of right eyelid ([place])    OSA (obstructive sleep apnea)    Persistent atrial fibrillation ([place])     Past Surgical History:  Procedure Laterality  Date   CARDIOVERSION [name] 06/05/2015   Procedure: CARDIOVERSION;  Surgeon: Sanda Klein, MD;  Location: [facility];  Service: Cardiovascular;  Laterality: [name];   CARDIOVERSION [name] 01/04/2021   Procedure: CARDIOVERSION;  Surgeon: [name], MD;  Location: [facility];  Service: Cardiovascular;  Laterality: [name];   CARDIOVERSION [name] 02/08/2021   Procedure: CARDIOVERSION;  Surgeon: Jerline Pain, MD;  Location: [facility] ENDOSCOPY;  Service: Cardiovascular;  Laterality: [name];   CARDIOVERSION [name] 04/14/2021   Procedure: CARDIOVERSION;  Surgeon: Freada Bergeron, MD;  Location: [facility] ENDOSCOPY;  Service: Cardiovascular;   Laterality: [name];   CARDIOVERSION [name] 07/05/2021   Procedure: CARDIOVERSION;  Surgeon: Fay Records, MD;  Location: [facility] ENDOSCOPY;  Service: Cardiovascular;  Laterality: [name];   EYE SURGERY  12/2017    Current Outpatient Medications  Medication Sig Dispense Refill   albuterol (VENTOLIN HFA) 108 (90 Base) MCG/ACT inhaler Inhale 2 puffs into the lungs every 4 (four) hours as needed for wheezing or shortness of breath (cough).     amiodarone (PACERONE) 200 MG tablet TAKE 1 TABLET BY MOUTH EVERY DAY 90 tablet 3   amLODipine (NORVASC) 2.5 MG tablet TAKE 1 TABLET BY MOUTH EVERY DAY 90 tablet 1   budesonide-formoterol (SYMBICORT) 80-4.5 MCG/ACT inhaler Inhale 2 puffs into the lungs 2 (two) times daily.      carvedilol (COREG) 6.25 MG tablet Take 1 tablet (6.25 mg total) by mouth 2 (two) times daily. 180 tablet 1   EPINEPHrine 0.3 mg/0.3 mL IJ SOAJ injection Inject 0.3 mg into the muscle as needed for anaphylaxis. 1 each 0   furosemide (LASIX) 20 MG tablet TAKE 1 TABLET BY MOUTH EVERY DAY 90 tablet 1   HYDROcodone-acetaminophen (NORCO/VICODIN) 5-325 MG tablet Take 1 tablet by mouth every 6 (six) hours as needed for severe pain (HEADACHE). 5 tablet 0   isosorbide mononitrate (IMDUR) 60 MG 24 hr tablet TAKE 1 TABLET BY MOUTH EVERY DAY 90 tablet 2   KLOR-CON M20 20 MEQ tablet TAKE 1 TABLET BY MOUTH EVERY DAY 90 tablet 1   lidocaine (LIDODERM) 5 % Place 1 patch onto the skin daily. Remove & Discard patch within 12 hours or as directed by MD 30 patch 0   MAGNESIUM CITRATE PO Take 250 mg by mouth at bedtime.     olmesartan (BENICAR) 40 MG tablet TAKE 1 TABLET EVERY DAY **REPLACES LOSARTAN** (Patient taking differently: Take 40 mg by mouth daily.) 90 tablet 2   Rivaroxaban (XARELTO) 15 MG TABS tablet Take 1 tablet (15 mg total) by mouth daily with supper. DO NOT RESTART until 11/22/2021 90 tablet 1   sertraline (ZOLOFT) 50 MG tablet Take 50 mg by mouth at bedtime.     triamcinolone (NASACORT ALLERGY 24HR) 55  MCG/ACT AERO nasal inhaler Place 2 sprays into the nose daily.     valACYclovir (VALTREX) 1000 MG tablet Take 1,000 mg by mouth at bedtime.     No current facility-administered medications for this visit.    Allergies:   Bee venom, Codeine, Iodine, Oxycodone, Penicillins, Benadryl [diphenhydramine], Diltiazem, Diatrizoate, Ivp dye [iodinated contrast media], Tape, and Yohimbine   Social History:  The patient  reports that he has never smoked. He has never used smokeless tobacco. He reports current alcohol use of about 1.0 standard drink of alcohol per week. He reports that he does not use drugs.   Family History:  The patient's family history includes Hypertension in his father and mother; Lung cancer in his father.  ROS:  Please see  the history of present illness.    All other systems are reviewed and otherwise negative.   PHYSICAL EXAM:  VS:  There were no vitals taken for this visit. BMI: There is no height or weight on file to calculate BMI. Well nourished, well developed, in no acute distress HEENT: normocephalic, atraumatic Neck: no JVD, carotid bruits or masses Cardiac:  RRR; no significant murmurs, no rubs, or gallops Lungs:  CTA b/l, no wheezing, rhonchi or rales Abd: soft, nontender MS: no deformity, advanced atrophy Ext: no edema Skin: warm and dry, no rash Neuro:  No gross deficits appreciated Psych: euthymic mood, full affect   EKG:  not done today   11/15/21: TTE 1. Compared to 10/09/19 aortic stenosis is slightly worse.   2. Left ventricular ejection fraction, by estimation, is 60 to 65%. The  left ventricle has normal function. The left ventricle has no regional  wall motion abnormalities. There is mild left ventricular hypertrophy.  Left ventricular diastolic parameters  are consistent with Grade II diastolic dysfunction (pseudonormalization).  Elevated left atrial pressure.   3. Right ventricular systolic function is normal. The right ventricular  size is  normal.   4. Left atrial size was severely dilated.   5. The mitral valve is normal in structure. Mild mitral valve  regurgitation. No evidence of mitral stenosis.   6. The aortic valve is calcified. Aortic valve regurgitation is mild.  Mild to moderate aortic valve stenosis.   7. Aortic dilatation noted. There is borderline dilatation of the aortic  root, measuring 38 mm.   8. The inferior vena cava is normal in size with greater than 50%  respiratory variability, suggesting right atrial pressure of 3 mmHg.   06/29/2015: stress myoview Nuclear stress EF: 55%. There was no ST segment deviation noted during stress. Patient was in atrial fibrillation throughout the study. There is a small defect of mild severity present in the basal inferolateral and mid inferolateral location. The defect is non-reversible. There is a small defect of mild severity present in the basal inferior and mid inferior location. The defect is non-reversible.There is a small defect of mild severity present in the apex location. The defect is non-reversible. No ischemia noted. This is a low risk study. The left ventricular ejection fraction is normal (55-65%).    Recent Labs: 11/15/2021: BUN 16; Creatinine, Ser 1.39; Potassium 3.5; Sodium 142 11/16/2021: Hemoglobin 8.9; Platelets 366 11/23/2021: ALT 12; TSH 2.220  No results found for requested labs within last 365 days.   CrCl cannot be calculated (Patient's most recent lab result is older than the maximum 21 days allowed.).   Wt Readings from Last 3 Encounters:  11/23/21 158 lb 12.8 oz (72 kg)  11/15/21 150 lb (68 kg)  10/14/21 156 lb 6.4 oz (70.9 kg)     Other studies reviewed: Additional studies/records reviewed today include: summarized above  ASSESSMENT AND PLAN:  Persistent AFib CHA2DS2Vasc is 3, on Xarelto, appropriately dosed unknown burden, no symptoms on amiodarone SR today by exam  traumatic SDH Falls, anemia, hematuria No changes Amio labs are  up to date No unusual SOB  HTN A bit low here today and at home Stop amlodipine  Disposition: we can see him back in 61mo sooner if needed  Current medicines are reviewed at length with the patient today.  The patient did not have any concerns regarding medicines.  SVenetia Night PA-C 01/01/2022 8:57 AM     [place]  [place] [address] [phone] (office)  [phone] (fax)

## 2022-01-03 ENCOUNTER — Ambulatory Visit: Payer: Medicare PPO | Admitting: Physician Assistant

## 2022-01-03 ENCOUNTER — Encounter: Payer: Self-pay | Admitting: Physician Assistant

## 2022-01-03 ENCOUNTER — Ambulatory Visit: Payer: Medicare PPO

## 2022-01-03 VITALS — BP 108/54 | HR 78 | Ht 68.0 in | Wt 160.0 lb

## 2022-01-03 DIAGNOSIS — H5410 Blindness, one eye, low vision other eye, unspecified eyes: Secondary | ICD-10-CM | POA: Diagnosis not present

## 2022-01-03 DIAGNOSIS — I10 Essential (primary) hypertension: Secondary | ICD-10-CM

## 2022-01-03 DIAGNOSIS — M6281 Muscle weakness (generalized): Secondary | ICD-10-CM | POA: Diagnosis not present

## 2022-01-03 DIAGNOSIS — R2681 Unsteadiness on feet: Secondary | ICD-10-CM | POA: Diagnosis not present

## 2022-01-03 DIAGNOSIS — I4819 Other persistent atrial fibrillation: Secondary | ICD-10-CM

## 2022-01-03 DIAGNOSIS — R262 Difficulty in walking, not elsewhere classified: Secondary | ICD-10-CM | POA: Diagnosis not present

## 2022-01-03 NOTE — Therapy (Signed)
OUTPATIENT PHYSICAL THERAPY TREATMENT   Patient Name: Austin [name] MRN: [id number] DOB:Dec 06, 1930, 88 y.o., male Today's Date: 01/03/2022  PCP: Burnard Bunting REFERRING PROVIDER: Lenoria Chime, MD    PT End of Session - 01/03/22 1150     Visit Number 12    Number of Visits 16    Date for PT Re-Evaluation 01/24/22    Authorization Type [employer]    Authorization Time Period approved 8 PT visits from 8/9-9/11/23    Authorization - Visit Number 12    Authorization - Number of Visits 16    Progress Note Due on Visit 18    PT Start Time 3524    PT Stop Time 1230    PT Time Calculation (min) 45 min    Equipment Utilized During Treatment Gait belt    Activity Tolerance Patient tolerated treatment well    Behavior During Therapy WFL for tasks assessed/performed               Past Medical History:  Diagnosis Date   Allergy    Asthma    BPH (benign prostatic hypertrophy)    CKD (chronic kidney disease) stage 3, GFR 30-59 ml/min (HCC)    Cough    Gout    Hypertensive chronic kidney disease    Melanoma in situ of right eyelid ([place])    OSA (obstructive sleep apnea)    Persistent atrial fibrillation ([place])    Past Surgical History:  Procedure Laterality Date   CARDIOVERSION [name] 06/05/2015   Procedure: CARDIOVERSION;  Surgeon: Sanda Klein, MD;  Location: [facility] ENDOSCOPY;  Service: Cardiovascular;  Laterality: [name];   CARDIOVERSION [name] 01/04/2021   Procedure: CARDIOVERSION;  Surgeon: [name], MD;  Location: [facility] ENDOSCOPY;  Service: Cardiovascular;  Laterality: [name];   CARDIOVERSION [name] 02/08/2021   Procedure: CARDIOVERSION;  Surgeon: Jerline Pain, MD;  Location: [facility] ENDOSCOPY;  Service: Cardiovascular;  Laterality: [name];   CARDIOVERSION [name] 04/14/2021   Procedure: CARDIOVERSION;  Surgeon: Freada Bergeron, MD;  Location: [facility] ENDOSCOPY;  Service: Cardiovascular;  Laterality: [name];   CARDIOVERSION [name] 07/05/2021   Procedure: CARDIOVERSION;  Surgeon: Fay Records, MD;  Location: [facility] ENDOSCOPY;  Service: Cardiovascular;  Laterality: [name];   EYE SURGERY  12/2017   Patient Active Problem List   Diagnosis Date Noted   Blind right eye 11/16/2021   Absent eyeball 11/16/2021   Subdural hematoma ([place]) 11/15/2021   (HFpEF) heart failure with preserved ejection fraction ([place]) 11/15/2021   Anemia 11/15/2021   Hematuria 11/15/2021   Dyspnea on exertion 09/19/2019   Atrial fibrillation and flutter ([place]) 03/08/2018   OSA on CPAP 03/08/2018   Atrial fibrillation, persistent ([place]) 08/10/2015   Atrial fibrillation ([place]) 07/29/2015   CPAP use counseling 07/29/2015   OSA and COPD overlap syndrome ([place]) 07/29/2015   Essential hypertension 06/22/2015   CKD (chronic kidney disease) 06/22/2015   Persistent atrial fibrillation ([place])    Gasping for breath 03/19/2015   Snoring 03/19/2015   Asthma, mild persistent 03/19/2015   COPD exacerbation ([place]) 03/19/2015   FUO (fever of unknown origin) 03/19/2013   Melanoma in situ of right eyelid ([place])    Neurotrophic keratoconjunctivitis 06/01/2011   Malignant neoplasm of conjunctiva ([place]) 03/02/2011   Trichiasis of eyelid 03/02/2011    ONSET DATE: 11/15/21  REFERRING DIAG: E18.5XAA (ICD-10-CM) - Subdural hematoma (HCC)   THERAPY DIAG:  Unsteadiness on feet  Muscle weakness (generalized)  Difficulty in walking, not elsewhere classified  Rationale for Evaluation and Treatment Rehabilitation  SUBJECTIVE:                                                                                                                                                                                              SUBJECTIVE STATEMENT: Having some fatigue and been busy today w/ MD appointment Pt accompanied by: significant other  PERTINENT HISTORY: history of A fib on Xarelto, HFpEF, Asthma, CKD 3, and melanoma of the R eye s/p enucleation who presented with fall at home and subdural hematoma without mass effec   PAIN:  Are you  having pain? No  PRECAUTIONS: Falls  WEIGHT BEARING RESTRICTIONS No  PLOF: Independent and Independent with basic ADLs  PATIENT GOALS improve balance, be able to walk up steps while carrying things   OBJECTIVE:    TODAY'S TREATMENT: 01/03/22 Activity Comments  HEP review 4# -hip ext, abd, minisquats, stair taps. Feet together EO/EC, tandem stance+head turns  4-square hurdles 5x clockwise/counter w/ walking stick and without  Stair ambulation Carrying 10 lbs dumbell on therapy steps x 5r round for single limb support   Retro-walking x 85 ft SBA-CGA, cues for hip extension                HOME EXERCISE PROGRAM Last updated: 12/27/21 Access Code: [email or web address] URL: [email or web address] Date: 12/27/2021 Prepared by: [facility]  Exercises - Sit to Stand with Arms Crossed  - 1 x daily - 5 x weekly - 2 sets - 10 reps - Standing Tandem Balance with Counter Support  - 1 x daily - 5 x weekly - 2 sets - 30 sec hold - Mini Squat with Counter Support  - 1 x daily - 5 x weekly - 2 sets - 10 reps - Standing Hip Abduction with Resistance at Ankles and Counter Support  - 1 x daily - 5 x weekly - 2 sets - 10 reps - Standing Hip Extension with Resistance at Ankles and Counter Support  - 1 x daily - 5 x weekly - 2 sets - 10 reps - Narrow Stance with Counter Support  - 1 x daily - 5 x weekly - 2-3 sets - 30 sec hold - Standing Toe Taps  - 1 x daily - 5 x weekly - 2 sets - 10 reps - Standing Balance with Eyes Closed  - 1 x daily - 5 x weekly - 2-3 sets - 30 sec hold   PATIENT EDUCATION: Education details: edu on current progress with PT and remaining impairments; HEP update; edu on methods for safer stair navigation  Person educated: Patient Education method: Explanation, Demonstration, Tactile  cues, Verbal cues, and Handouts Education comprehension: verbalized understanding and returned demonstration    Below measures were taken at time  of initial evaluation unless otherwise specified:   DIAGNOSTIC FINDINGS: no mass effect  COGNITION: Overall cognitive status: Impaired, short-term memory    SENSATION: WFL  COORDINATION: WFL  EDEMA:    MUSCLE TONE: WNL    POSTURE: increased thoracic kyphosis and scoliosis?  LOWER EXTREMITY ROM:     WFL   LOWER EXTREMITY MMT:    3+/5 gross BLE strength for resisted tests in sitting  BED MOBILITY:  independent  TRANSFERS: Assistive device utilized:  BUE   Sit to stand: Modified independence Stand to sit: Modified independence Chair to chair: Modified independence Floor:  DNT   STAIRS:  Level of Assistance: Modified independence  Stair Negotiation Technique: Step to Pattern Alternating Pattern  with Bilateral Rails  Number of Stairs: 10   Height of Stairs: 4-6"  Comments:   GAIT: Gait pattern: WFL Distance walked:  Assistive device utilized: Single point cane Level of assistance: Modified independence Comments: deficits during turns   FUNCTIONAL TESTs:  5 times sit to stand: 17.91 with retro-LOB and pushing knees into mat table Timed up and go (TUG): 15.75 sec with walking stick; 14.19 sec without AD Berg Balance Scale: 43/45 indicating high risk for falls  PATIENT SURVEYS:    TODAY'S TREATMENT:  Assessment; HEP initiated   PATIENT EDUCATION: Education details: assessment findings, rationale of PT intervention Person educated: Patient and Spouse Education method: Explanation Education comprehension: verbalized understanding      GOALS: Goals reviewed with patient? Yes  SHORT TERM GOALS: Target date: 01/10/22  Patient will be independent in HEP to improve functional outcomes Baseline: Goal status: MET    LONG TERM GOALS: Target date: 01/24/22  Demonstrate improved BLE strength and balance as evidenced by time of <15 sec 5xSTS test to reduce risk for falls Baseline: 17.91 18" seat height, several LOB, compensation with legs  against table. 18" seat 14.23 sec without compensation Goal status: MET  2.  Manifest low risk for falls per score 50/56 Berg Balance Test to improve safety with mobility and ADL Baseline: 43/56; 48/56 (12/20/21); 48/56 (12/27/21) Goal status: IN PROGRESS  3.  Reduce risk for falls per TUG test 12 sec Baseline: 14-17 sec; 14 sec (12/20/21); 10.68 sec (12/27/21) Goal status: MET 12/27/21  4.  Demonstrate improved safety, balance, strength, and activity tolerance as evidenced by ability to carry heavy item up/down 12 steps to improve safety with access to beach house Baseline: unable; modified indep carrying 4 lbs grocery bag; able to carry blue medball and use 1 handrail safely 12/27/21 Goal status: IN PROGRESS    ASSESSMENT:  CLINICAL IMPRESSION: Difficulty with stepping over obstacles, especially in backwards direction requiring coaching/instruction in body mechanics and weight shifting to make small postural adjustments to improve loading response to reduce LOB.  Improved performance tapered from needing walking stick for support to unassisted. Will perform re-assessment at next session and anticipate D/C to HEP as he demo 100% HEP recall   OBJECTIVE IMPAIRMENTS cardiopulmonary status limiting activity, decreased activity tolerance, decreased balance, decreased cognition, decreased endurance, decreased mobility, difficulty walking, decreased strength, and postural dysfunction.   ACTIVITY LIMITATIONS carrying, lifting, standing, squatting, stairs, transfers, and locomotion level  PARTICIPATION LIMITATIONS: meal prep, cleaning, laundry, community activity, and access to vacation home  PERSONAL FACTORS Age, Time since onset of injury/illness/exacerbation, and 1-2 comorbidities: A-fib, visual impairment  are also affecting patient's functional outcome.  REHAB POTENTIAL: Good  CLINICAL DECISION MAKING: Evolving/moderate complexity  EVALUATION COMPLEXITY: Moderate  PLAN: PT FREQUENCY:  1-2x/week  PT DURATION: 4 weeks  PLANNED INTERVENTIONS: Therapeutic exercises, Therapeutic activity, Neuromuscular re-education, Balance training, Gait training, Patient/Family education, Self Care, Joint mobilization, Stair training, Vestibular training, Canalith repositioning, Orthotic/Fit training, DME instructions, Aquatic Therapy, Dry Needling, Cryotherapy, Moist heat, and Manual therapy  PLAN FOR NEXT SESSION: work on exercises with EC (d/t patient reporting issues with his good eye) and balance with narrow BOS, SLS; Continue with this session's activities to hopefully improve RLE strength for managing 6" rise step. Continue x 2 visits per pt request?   11:50 AM, 01/03/22 M. Sherlyn Lees, PT, DPT Physical Therapist- [place] Office Number: [phone]   [place] [facility] [address], [place], [place] [address] Phone # [phone] Fax # [phone]

## 2022-01-03 NOTE — Patient Instructions (Addendum)
Medication Instructions:    STOP TAKING AND REMOVE THIS MEDICATION FROM YOUR MEDICATION LIST: AMLODIPINE 2.5 MG   *If you need a refill on your cardiac medications before your next appointment, please call your pharmacy*   Lab Work: NONE ORDERED  TODAY    If you have labs (blood work) drawn today and your tests are completely normal, you will receive your results only by: [place] (if you have MyChart) OR A paper copy in the mail If you have any lab test that is abnormal or we need to change your treatment, we will call you to review the results.   Testing/Procedures: NONE ORDERED  TODAY   Follow-Up: At [facility], you and your health needs are our priority.  As part of our continuing mission to provide you with exceptional heart care, we have created designated Provider Care Teams.  These Care Teams include your primary Cardiologist (physician) and Advanced Practice Providers (APPs -  Physician Assistants and Nurse Practitioners) who all work together to provide you with the care you need, when you need it.  We recommend signing up for the patient portal called "MyChart".  Sign up information is provided on this After Visit Summary.  MyChart is used to connect with patients for Virtual Visits (Telemedicine).  Patients are able to view lab/test results, encounter notes, upcoming appointments, etc.  Non-urgent messages can be sent to your provider as well.   To learn more about what you can do with MyChart, go to [email or web address].    Your next appointment:   6 month(s)  The format for your next appointment:   In Person  Provider:   Allegra Lai, MD{     Other Instructions  Important Information About Sugar

## 2022-01-04 ENCOUNTER — Ambulatory Visit: Payer: Medicare PPO

## 2022-01-05 ENCOUNTER — Ambulatory Visit: Payer: Medicare PPO

## 2022-01-05 DIAGNOSIS — R2681 Unsteadiness on feet: Secondary | ICD-10-CM | POA: Diagnosis not present

## 2022-01-05 DIAGNOSIS — H5410 Blindness, one eye, low vision other eye, unspecified eyes: Secondary | ICD-10-CM | POA: Diagnosis not present

## 2022-01-05 DIAGNOSIS — R262 Difficulty in walking, not elsewhere classified: Secondary | ICD-10-CM | POA: Diagnosis not present

## 2022-01-05 DIAGNOSIS — M6281 Muscle weakness (generalized): Secondary | ICD-10-CM | POA: Diagnosis not present

## 2022-01-05 NOTE — Therapy (Signed)
OUTPATIENT PHYSICAL THERAPY TREATMENT and D/C Summary   Patient Name: Austin [name] MRN: [id number] DOB:1930-11-28, 88 y.o., male Today's Date: 01/05/2022  PCP: Burnard Bunting REFERRING PROVIDER: Lenoria Chime, MD  PHYSICAL THERAPY DISCHARGE SUMMARY  Visits from Start of Care: 13  Current functional level related to goals / functional outcomes: All STG/LTG met   Remaining deficits: Some deficits with narrow BOS and single limb balance   Education / Equipment: HEP   Patient agrees to discharge. Patient goals were met. Patient is being discharged due to meeting the stated rehab goals.   PT End of Session - 01/05/22 0930     Visit Number 13    Number of Visits 16    Date for PT Re-Evaluation 01/24/22    Authorization Type [employer]    Authorization Time Period approved 8 PT visits from 8/9-9/11/23    Authorization - Visit Number 76    Authorization - Number of Visits 16    Progress Note Due on Visit 18    PT Start Time 0930    PT Stop Time 1015    PT Time Calculation (min) 45 min    Equipment Utilized During Treatment Gait belt    Activity Tolerance Patient tolerated treatment well    Behavior During Therapy WFL for tasks assessed/performed               Past Medical History:  Diagnosis Date   Allergy    Asthma    BPH (benign prostatic hypertrophy)    CKD (chronic kidney disease) stage 3, GFR 30-59 ml/min (HCC)    Cough    Gout    Hypertensive chronic kidney disease    Melanoma in situ of right eyelid (HCC)    OSA (obstructive sleep apnea)    Persistent atrial fibrillation ([place])    Past Surgical History:  Procedure Laterality Date   CARDIOVERSION [name] 06/05/2015   Procedure: CARDIOVERSION;  Surgeon: Sanda Klein, MD;  Location: [place];  Service: Cardiovascular;  Laterality: [name];   CARDIOVERSION [name] 01/04/2021   Procedure: CARDIOVERSION;  Surgeon: [name], MD;  Location: [facility] ENDOSCOPY;  Service: Cardiovascular;  Laterality:  [name];   CARDIOVERSION [name] 02/08/2021   Procedure: CARDIOVERSION;  Surgeon: Jerline Pain, MD;  Location: [facility] ENDOSCOPY;  Service: Cardiovascular;  Laterality: [name];   CARDIOVERSION [name] 04/14/2021   Procedure: CARDIOVERSION;  Surgeon: Freada Bergeron, MD;  Location: [facility] ENDOSCOPY;  Service: Cardiovascular;  Laterality: [name];   CARDIOVERSION [name] 07/05/2021   Procedure: CARDIOVERSION;  Surgeon: Fay Records, MD;  Location: [place];  Service: Cardiovascular;  Laterality: [name];   EYE SURGERY  12/2017   Patient Active Problem List   Diagnosis Date Noted   Blind right eye 11/16/2021   Absent eyeball 11/16/2021   Subdural hematoma ([place]) 11/15/2021   (HFpEF) heart failure with preserved ejection fraction ([place]) 11/15/2021   Anemia 11/15/2021   Hematuria 11/15/2021   Dyspnea on exertion 09/19/2019   Atrial fibrillation and flutter ([place]) 03/08/2018   OSA on CPAP 03/08/2018   Atrial fibrillation, persistent ([place]) 08/10/2015   Atrial fibrillation ([place]) 07/29/2015   CPAP use counseling 07/29/2015   OSA and COPD overlap syndrome ([place]) 07/29/2015   Essential hypertension 06/22/2015   CKD (chronic kidney disease) 06/22/2015   Persistent atrial fibrillation ([place])    Gasping for breath 03/19/2015   Snoring 03/19/2015   Asthma, mild persistent 03/19/2015   COPD exacerbation ([place]) 03/19/2015   FUO (fever of unknown origin) 03/19/2013   Melanoma in  situ of right eyelid ([place])    Neurotrophic keratoconjunctivitis 06/01/2011   Malignant neoplasm of conjunctiva ([place]) 03/02/2011   Trichiasis of eyelid 03/02/2011    ONSET DATE: 11/15/21  REFERRING DIAG: B35.5XAA (ICD-10-CM) - Subdural hematoma (HCC)   THERAPY DIAG:  Unsteadiness on feet  Muscle weakness (generalized)  Difficulty in walking, not elsewhere classified  Rationale for Evaluation and Treatment Rehabilitation  SUBJECTIVE:                                                                                                                                                                                               SUBJECTIVE STATEMENT: Having some fatigue and been busy today w/ MD appointment Pt accompanied by: significant other  PERTINENT HISTORY: history of A fib on Xarelto, HFpEF, Asthma, CKD 3, and melanoma of the R eye s/p enucleation who presented with fall at home and subdural hematoma without mass effec   PAIN:  Are you having pain? No  PRECAUTIONS: Falls  WEIGHT BEARING RESTRICTIONS No  PLOF: Independent and Independent with basic ADLs  PATIENT GOALS improve balance, be able to walk up steps while carrying things   OBJECTIVE:   TODAY'S TREATMENT: 01/05/22 Activity Comments  Berg Balance Test 50/56  Stair ambulation  Mod indep. Carrying 10#  Sit-stand 10#   Sit-stand 2x5 10#   Standing hip abd, ext   Narrow stance, tandem, single leg stance      HOME EXERCISE PROGRAM Last updated: 12/27/21 Access Code: [email or web address] URL: [email or web address] Date: 12/27/2021 Prepared by: [facility]  Exercises - Sit to Stand with Arms Crossed  - 1 x daily - 5 x weekly - 2 sets - 10 reps - Standing Tandem Balance with Counter Support  - 1 x daily - 5 x weekly - 2 sets - 30 sec hold - Mini Squat with Counter Support  - 1 x daily - 5 x weekly - 2 sets - 10 reps - Standing Hip Abduction with Resistance at Ankles and Counter Support  - 1 x daily - 5 x weekly - 2 sets - 10 reps - Standing Hip Extension with Resistance at Ankles and Counter Support  - 1 x daily - 5 x weekly - 2 sets - 10 reps - Narrow Stance with Counter Support  - 1 x daily - 5 x weekly - 2-3 sets - 30 sec hold - Standing Toe Taps  - 1 x daily - 5 x weekly - 2 sets - 10 reps - Standing Balance with Eyes Closed  - 1 x daily - 5 x weekly - 2-3 sets -  30 sec hold   PATIENT EDUCATION: Education details: edu on current progress with PT and remaining impairments; HEP update; edu on methods for safer stair  navigation  Person educated: Patient Education method: Explanation, Demonstration, Tactile cues, Verbal cues, and Handouts Education comprehension: verbalized understanding and returned demonstration    Below measures were taken at time of initial evaluation unless otherwise specified:   DIAGNOSTIC FINDINGS: no mass effect  COGNITION: Overall cognitive status: Impaired, short-term memory    SENSATION: WFL  COORDINATION: WFL  EDEMA:    MUSCLE TONE: WNL    POSTURE: increased thoracic kyphosis and scoliosis?  LOWER EXTREMITY ROM:     WFL   LOWER EXTREMITY MMT:    3+/5 gross BLE strength for resisted tests in sitting  BED MOBILITY:  independent  TRANSFERS: Assistive device utilized:  BUE   Sit to stand: Modified independence Stand to sit: Modified independence Chair to chair: Modified independence Floor:  DNT   STAIRS:  Level of Assistance: Modified independence  Stair Negotiation Technique: Step to Pattern Alternating Pattern  with Bilateral Rails  Number of Stairs: 10   Height of Stairs: 4-6"  Comments:   GAIT: Gait pattern: WFL Distance walked:  Assistive device utilized: Single point cane Level of assistance: Modified independence Comments: deficits during turns   FUNCTIONAL TESTs:  5 times sit to stand: 17.91 with retro-LOB and pushing knees into mat table Timed up and go (TUG): 15.75 sec with walking stick; 14.19 sec without AD Berg Balance Scale: 43/45 indicating high risk for falls  PATIENT SURVEYS:    TODAY'S TREATMENT:  Assessment; HEP initiated   PATIENT EDUCATION: Education details: assessment findings, rationale of PT intervention Person educated: Patient and Spouse Education method: Explanation Education comprehension: verbalized understanding      GOALS: Goals reviewed with patient? Yes  SHORT TERM GOALS: Target date: 01/10/22  Patient will be independent in HEP to improve functional outcomes Baseline: Goal  status: MET    LONG TERM GOALS: Target date: 01/24/22  Demonstrate improved BLE strength and balance as evidenced by time of <15 sec 5xSTS test to reduce risk for falls Baseline: 17.91 18" seat height, several LOB, compensation with legs against table. 18" seat 14.23 sec without compensation Goal status: MET  2.  Manifest low risk for falls per score 50/56 Berg Balance Test to improve safety with mobility and ADL Baseline: 43/56; 48/56 (12/20/21); 48/56 (12/27/21); 50/56 (01/05/22) Goal status: MET  3.  Reduce risk for falls per TUG test 12 sec Baseline: 14-17 sec; 14 sec (12/20/21); 10.68 sec (12/27/21) Goal status: MET 12/27/21  4.  Demonstrate improved safety, balance, strength, and activity tolerance as evidenced by ability to carry heavy item up/down 12 steps to improve safety with access to beach house Baseline: unable; modified indep carrying 4 lbs grocery bag; able to carry blue medball and use 1 handrail safely 12/27/21; able to carry 10# w/ single HR reciprocal pattern Goal status: MET    ASSESSMENT:  CLINICAL IMPRESSION: Pt able to meet STG/LTG and demonstrates low risk for falls per assessment tools and able to meet patient goal of carrying items up/down stairs with reciprocal pattern and single HR.  D/C to HEP   OBJECTIVE IMPAIRMENTS cardiopulmonary status limiting activity, decreased activity tolerance, decreased balance, decreased cognition, decreased endurance, decreased mobility, difficulty walking, decreased strength, and postural dysfunction.   ACTIVITY LIMITATIONS carrying, lifting, standing, squatting, stairs, transfers, and locomotion level  PARTICIPATION LIMITATIONS: meal prep, cleaning, laundry, community activity, and access to vacation home  PERSONAL  FACTORS Age, Time since onset of injury/illness/exacerbation, and 1-2 comorbidities: A-fib, visual impairment  are also affecting patient's functional outcome.   REHAB POTENTIAL: Good  CLINICAL DECISION MAKING:  Evolving/moderate complexity  EVALUATION COMPLEXITY: Moderate  PLAN: PT FREQUENCY: 1-2x/week  PT DURATION: 4 weeks  PLANNED INTERVENTIONS: Therapeutic exercises, Therapeutic activity, Neuromuscular re-education, Balance training, Gait training, Patient/Family education, Self Care, Joint mobilization, Stair training, Vestibular training, Canalith repositioning, Orthotic/Fit training, DME instructions, Aquatic Therapy, Dry Needling, Cryotherapy, Moist heat, and Manual therapy  PLAN FOR NEXT SESSION: D/C to HEP   9:30 AM, 01/05/22 M. Sherlyn Lees, PT, DPT Physical Therapist- [place] Office Number: [phone]   [place] [facility] [address], [place], [place] [address] Phone # [phone] Fax # [phone]

## 2022-01-06 ENCOUNTER — Ambulatory Visit: Payer: Medicare PPO | Admitting: Occupational Therapy

## 2022-01-06 DIAGNOSIS — M6281 Muscle weakness (generalized): Secondary | ICD-10-CM

## 2022-01-06 DIAGNOSIS — R262 Difficulty in walking, not elsewhere classified: Secondary | ICD-10-CM | POA: Diagnosis not present

## 2022-01-06 DIAGNOSIS — R2681 Unsteadiness on feet: Secondary | ICD-10-CM | POA: Diagnosis not present

## 2022-01-06 DIAGNOSIS — H5410 Blindness, one eye, low vision other eye, unspecified eyes: Secondary | ICD-10-CM

## 2022-01-06 NOTE — Therapy (Signed)
OUTPATIENT OCCUPATIONAL THERAPY NEURO EVALUATION  Patient Name: Austin [name] MRN: [id number] DOB:[birth year], 88 y.o., male Today's Date: 01/07/2022  PCP: Dr. Reynaldo Minium REFERRING PROVIDER: Dr Susa Simmonds   OT End of Session - 01/07/22 1414     Visit Number 1    Number of Visits 1    Authorization Type [employer]    OT Start Time 72    OT Stop Time 1200    OT Time Calculation (min) 55 min    Behavior During Therapy WFL for tasks assessed/performed             Past Medical History:  Diagnosis Date   Allergy    Asthma    BPH (benign prostatic hypertrophy)    CKD (chronic kidney disease) stage 3, GFR 30-59 ml/min (HCC)    Cough    Gout    Hypertensive chronic kidney disease    Melanoma in situ of right eyelid ([place])    OSA (obstructive sleep apnea)    Persistent atrial fibrillation ([place])    Past Surgical History:  Procedure Laterality Date   CARDIOVERSION [name] 06/05/2015   Procedure: CARDIOVERSION;  Surgeon: Sanda Klein, MD;  Location: [place];  Service: Cardiovascular;  Laterality: [name];   CARDIOVERSION [name] 01/04/2021   Procedure: CARDIOVERSION;  Surgeon: [name], MD;  Location: [facility] ENDOSCOPY;  Service: Cardiovascular;  Laterality: [name];   CARDIOVERSION [name] 02/08/2021   Procedure: CARDIOVERSION;  Surgeon: Jerline Pain, MD;  Location: [place];  Service: Cardiovascular;  Laterality: [name];   CARDIOVERSION [name] 04/14/2021   Procedure: CARDIOVERSION;  Surgeon: Freada Bergeron, MD;  Location: [facility] ENDOSCOPY;  Service: Cardiovascular;  Laterality: [name];   CARDIOVERSION [name] 07/05/2021   Procedure: CARDIOVERSION;  Surgeon: Fay Records, MD;  Location: [place];  Service: Cardiovascular;  Laterality: [name];   EYE SURGERY  12/2017   Patient Active Problem List   Diagnosis Date Noted   Blind right eye 11/16/2021   Absent eyeball 11/16/2021   Subdural hematoma ([place]) 11/15/2021   (HFpEF) heart failure with preserved ejection fraction ([place])  11/15/2021   Anemia 11/15/2021   Hematuria 11/15/2021   Dyspnea on exertion 09/19/2019   Atrial fibrillation and flutter ([place]) 03/08/2018   OSA on CPAP 03/08/2018   Atrial fibrillation, persistent ([place]) 08/10/2015   Atrial fibrillation ([place]) 07/29/2015   CPAP use counseling 07/29/2015   OSA and COPD overlap syndrome ([place]) 07/29/2015   Essential hypertension 06/22/2015   CKD (chronic kidney disease) 06/22/2015   Persistent atrial fibrillation ([place])    Gasping for breath 03/19/2015   Snoring 03/19/2015   Asthma, mild persistent 03/19/2015   COPD exacerbation ([place]) 03/19/2015   FUO (fever of unknown origin) 03/19/2013   Melanoma in situ of right eyelid ([place])    Neurotrophic keratoconjunctivitis 06/01/2011   Malignant neoplasm of conjunctiva ([place]) 03/02/2011   Trichiasis of eyelid 03/02/2011    ONSET DATE: 10/13/21  REFERRING DIAG: macular degeneration, enucleation OD on 01/29/20  THERAPY DIAG:  Blindness, one eye, low vision other eye, unspecified eyes  Rationale for Evaluation and Treatment Rehabilitation  SUBJECTIVE:   SUBJECTIVE STATEMENT: Pt wants to see better Pt accompanied by: significant other  PERTINENT HISTORY: SDH  PRECAUTIONS: Fall  WEIGHT BEARING RESTRICTIONS No  PAIN:  Are you having pain? No  FALLS: Has patient fallen in last 6 months? Yes. Number of falls 2  LIVING ENVIRONMENT: Lives with: lives with their spouse Lives in: House/apartment   PLOF: Independent  PATIENT GOALS tips for safety, reading  OBJECTIVE:  HAND DOMINANCE: Right  ADLs: Overall ADLs: modified independent with all basic  Transfers/ambulation related to ADLs:mod I with recent falls   IADLs:  Handwriting: 100% legible  MOBILITY STATUS: Hx of falls      COGNITION: Overall cognitive status: Within functional limits for tasks assessed Mini mental state :26/30 WNLS VISION: Subjective report: Pt reports difficulty reading bills on computer Baseline vision:  macular degeneration, enucleation OD on 01/29/20 Visual history: macular degeneration  VISION ASSESSMENT: Reading acuity: 20/100-1 Pt with recent falls due to monocular vision. Pt reports visual deficits on L side using clock method. Pt was able to perform eccentric viewing following instruction.  Patient has difficulty with following activities due to following visual impairments: reading, falls  PERCEPTION: Impaired: depth perception due to monocular vision      TODAY'S TREATMENT:  Pt/ wife were instructed in safety to minimize falls due to visual deficits.Therapist recommends use of 3 in 1 over commode during day and by bedside at night. Pt/ wife were in instructed in eccentric viewing, handheld, stand and electronic video magnifier use.  Pt returned demonstration following education.    PATIENT EDUCATION: Education details: safety recommendations to minimize fall risk, eccentric viewing,4x  handheld, 3x stand, recommendations for enlarging items on the computer and electronic video magnifier use. Person educated: Patient and Spouse Education method: Explanation, Demonstration, Verbal cues, and Handouts Education comprehension: verbalized understanding and returned demonstration   HOME EXERCISE PROGRAM: [name]    GOALS: No goals set as education was completed on day of eval.   ASSESSMENT:  CLINICAL IMPRESSION: Patient is a 88 y.o. male who was seen today for occupational therapy evaluation for macular degeneration, enucleation OD on 01/29/20. Pt has a PMH significant for SDH and pt just recently finished working with PT for balance.  PERFORMANCE DEFICITS in functional skills including vision, cognitive skills  and psychosocial skills including coping strategies, environmental adaptation, habits, interpersonal interactions, and routines and behaviors.   IMPAIRMENTS are limiting patient from ADLs, IADLs, play, leisure, and social participation.   COMORBIDITIES may have  co-morbidities  that affects occupational performance. Patient will benefit from skilled OT to address above impairments and improve overall function.  MODIFICATION OR ASSISTANCE TO COMPLETE EVALUATION: Min-Moderate modification of tasks or assist with assess necessary to complete an evaluation.  OT OCCUPATIONAL PROFILE AND HISTORY: Problem focused assessment: Including review of records relating to presenting problem.  CLINICAL DECISION MAKING: LOW - limited treatment options, no task modification necessary  REHAB POTENTIAL: Good  EVALUATION COMPLEXITY: Low    PLAN: OT FREQUENCY: one time visit plus eval  OT DURATION: 4 weeks  PLANNED INTERVENTIONS: self care/ADL training, therapeutic activity, patient/family education, visual/perceptual remediation/compensation, and DME and/or AE instructions  RECOMMENDED OTHER SERVICES: n/a  CONSULTED AND AGREED WITH PLAN OF CARE: Patient and family member/caregiver  PLAN FOR NEXT SESSION: one time visit, education completed day of eval   Anup Brigham, OT 01/07/2022, 2:15 PM

## 2022-01-20 DIAGNOSIS — R3912 Poor urinary stream: Secondary | ICD-10-CM | POA: Diagnosis not present

## 2022-01-20 DIAGNOSIS — R3915 Urgency of urination: Secondary | ICD-10-CM | POA: Diagnosis not present

## 2022-01-20 DIAGNOSIS — R3121 Asymptomatic microscopic hematuria: Secondary | ICD-10-CM | POA: Diagnosis not present

## 2022-02-15 DIAGNOSIS — C44729 Squamous cell carcinoma of skin of left lower limb, including hip: Secondary | ICD-10-CM | POA: Diagnosis not present

## 2022-02-15 DIAGNOSIS — H353122 Nonexudative age-related macular degeneration, left eye, intermediate dry stage: Secondary | ICD-10-CM | POA: Diagnosis not present

## 2022-02-15 DIAGNOSIS — L821 Other seborrheic keratosis: Secondary | ICD-10-CM | POA: Diagnosis not present

## 2022-02-15 DIAGNOSIS — L570 Actinic keratosis: Secondary | ICD-10-CM | POA: Diagnosis not present

## 2022-02-15 DIAGNOSIS — C44311 Basal cell carcinoma of skin of nose: Secondary | ICD-10-CM | POA: Diagnosis not present

## 2022-02-15 DIAGNOSIS — L814 Other melanin hyperpigmentation: Secondary | ICD-10-CM | POA: Diagnosis not present

## 2022-02-15 DIAGNOSIS — Z85828 Personal history of other malignant neoplasm of skin: Secondary | ICD-10-CM | POA: Diagnosis not present

## 2022-02-15 DIAGNOSIS — Z9889 Other specified postprocedural states: Secondary | ICD-10-CM | POA: Diagnosis not present

## 2022-02-15 DIAGNOSIS — C44622 Squamous cell carcinoma of skin of right upper limb, including shoulder: Secondary | ICD-10-CM | POA: Diagnosis not present

## 2022-02-15 DIAGNOSIS — Z961 Presence of intraocular lens: Secondary | ICD-10-CM | POA: Diagnosis not present

## 2022-02-15 DIAGNOSIS — C44612 Basal cell carcinoma of skin of right upper limb, including shoulder: Secondary | ICD-10-CM | POA: Diagnosis not present

## 2022-02-22 ENCOUNTER — Other Ambulatory Visit: Payer: Self-pay | Admitting: Cardiology

## 2022-02-23 DIAGNOSIS — I4891 Unspecified atrial fibrillation: Secondary | ICD-10-CM | POA: Diagnosis not present

## 2022-02-23 DIAGNOSIS — R531 Weakness: Secondary | ICD-10-CM | POA: Diagnosis not present

## 2022-02-23 DIAGNOSIS — I5032 Chronic diastolic (congestive) heart failure: Secondary | ICD-10-CM | POA: Diagnosis not present

## 2022-02-23 DIAGNOSIS — Z23 Encounter for immunization: Secondary | ICD-10-CM | POA: Diagnosis not present

## 2022-02-23 DIAGNOSIS — J449 Chronic obstructive pulmonary disease, unspecified: Secondary | ICD-10-CM | POA: Diagnosis not present

## 2022-02-23 DIAGNOSIS — I10 Essential (primary) hypertension: Secondary | ICD-10-CM | POA: Diagnosis not present

## 2022-02-23 DIAGNOSIS — F5101 Primary insomnia: Secondary | ICD-10-CM | POA: Diagnosis not present

## 2022-02-23 DIAGNOSIS — D509 Iron deficiency anemia, unspecified: Secondary | ICD-10-CM | POA: Diagnosis not present

## 2022-02-25 ENCOUNTER — Other Ambulatory Visit (HOSPITAL_COMMUNITY): Payer: Self-pay | Admitting: *Deleted

## 2022-02-25 DIAGNOSIS — D649 Anemia, unspecified: Secondary | ICD-10-CM

## 2022-02-28 ENCOUNTER — Encounter (HOSPITAL_COMMUNITY)
Admission: RE | Admit: 2022-02-28 | Discharge: 2022-02-28 | Disposition: A | Discharge status: Home / Self Care | Payer: Medicare PPO | Source: Ambulatory Visit | Attending: Internal Medicine | Admitting: Internal Medicine

## 2022-02-28 DIAGNOSIS — I4819 Other persistent atrial fibrillation: Secondary | ICD-10-CM | POA: Diagnosis not present

## 2022-02-28 DIAGNOSIS — D649 Anemia, unspecified: Secondary | ICD-10-CM | POA: Insufficient documentation

## 2022-02-28 DIAGNOSIS — I10 Essential (primary) hypertension: Secondary | ICD-10-CM | POA: Insufficient documentation

## 2022-02-28 DIAGNOSIS — R0609 Other forms of dyspnea: Secondary | ICD-10-CM | POA: Insufficient documentation

## 2022-02-28 LAB — ABO/RH: ABO/RH(D): O POS

## 2022-02-28 LAB — PREPARE RBC (CROSSMATCH)

## 2022-02-28 MED ORDER — FUROSEMIDE 10 MG/ML IJ SOLN
INTRAMUSCULAR | Status: AC
  Filled 2022-02-28: qty 2

## 2022-02-28 MED ORDER — FUROSEMIDE 10 MG/ML IJ SOLN
20.0000 mg | Freq: Once | INTRAMUSCULAR | Status: AC
  Administered 2022-02-28: 20 mg via INTRAVENOUS

## 2022-02-28 MED ORDER — SODIUM CHLORIDE 0.9% IV SOLUTION: Freq: Once | INTRAVENOUS | Status: DC

## 2022-02-28 NOTE — Progress Notes (Signed)
Blood bank called and requested to know patient's hemoglobin. I called Dr Austin [name] office and confirmed with Prudence Davidson his hgb on 10/11 was 7.6

## 2022-03-01 LAB — BPAM RBC
Blood Product Expiration Date: 202311162359
Blood Product Expiration Date: 202311162359
ISSUE DATE / TIME: 202310160950
ISSUE DATE / TIME: 202310161306
Unit Type and Rh: 5100
Unit Type and Rh: 5100

## 2022-03-01 LAB — TYPE AND SCREEN
ABO/RH(D): O POS
Antibody Screen: NEGATIVE
Unit division: 0
Unit division: 0

## 2022-03-03 DIAGNOSIS — R531 Weakness: Secondary | ICD-10-CM | POA: Diagnosis not present

## 2022-03-03 DIAGNOSIS — D509 Iron deficiency anemia, unspecified: Secondary | ICD-10-CM | POA: Diagnosis not present

## 2022-03-03 DIAGNOSIS — J449 Chronic obstructive pulmonary disease, unspecified: Secondary | ICD-10-CM | POA: Diagnosis not present

## 2022-03-03 DIAGNOSIS — I130 Hypertensive heart and chronic kidney disease with heart failure and stage 1 through stage 4 chronic kidney disease, or unspecified chronic kidney disease: Secondary | ICD-10-CM | POA: Diagnosis not present

## 2022-03-03 DIAGNOSIS — R0600 Dyspnea, unspecified: Secondary | ICD-10-CM | POA: Diagnosis not present

## 2022-03-03 DIAGNOSIS — R195 Other fecal abnormalities: Secondary | ICD-10-CM | POA: Diagnosis not present

## 2022-03-03 DIAGNOSIS — I5033 Acute on chronic diastolic (congestive) heart failure: Secondary | ICD-10-CM | POA: Diagnosis not present

## 2022-03-03 DIAGNOSIS — I4891 Unspecified atrial fibrillation: Secondary | ICD-10-CM | POA: Diagnosis not present

## 2022-03-06 ENCOUNTER — Emergency Department (HOSPITAL_COMMUNITY)
Admission: EM | Admit: 2022-03-06 | Discharge: 2022-03-06 | Discharge status: Against Medical Advice | Payer: Medicare PPO | Attending: Emergency Medicine | Admitting: Emergency Medicine

## 2022-03-06 ENCOUNTER — Encounter (HOSPITAL_COMMUNITY): Payer: Self-pay | Admitting: Emergency Medicine

## 2022-03-06 DIAGNOSIS — R7889 Finding of other specified substances, not normally found in blood: Secondary | ICD-10-CM | POA: Diagnosis not present

## 2022-03-06 DIAGNOSIS — R0602 Shortness of breath: Secondary | ICD-10-CM | POA: Diagnosis present

## 2022-03-06 DIAGNOSIS — I4891 Unspecified atrial fibrillation: Secondary | ICD-10-CM | POA: Diagnosis not present

## 2022-03-06 DIAGNOSIS — I10 Essential (primary) hypertension: Secondary | ICD-10-CM | POA: Insufficient documentation

## 2022-03-06 DIAGNOSIS — Z5321 Procedure and treatment not carried out due to patient leaving prior to being seen by health care provider: Secondary | ICD-10-CM | POA: Diagnosis not present

## 2022-03-06 LAB — BASIC METABOLIC PANEL
Anion gap: 7 (ref 5–15)
BUN: 14 mg/dL (ref 8–23)
CO2: 28 mmol/L (ref 22–32)
Calcium: 8.4 mg/dL — ABNORMAL LOW (ref 8.9–10.3)
Chloride: 104 mmol/L (ref 98–111)
Creatinine, Ser: 1.41 mg/dL — ABNORMAL HIGH (ref 0.61–1.24)
GFR, Estimated: 48 mL/min — ABNORMAL LOW (ref >60)
Glucose, Bld: 105 mg/dL — ABNORMAL HIGH (ref 70–99)
Potassium: 3.6 mmol/L (ref 3.5–5.1)
Sodium: 139 mmol/L (ref 135–145)

## 2022-03-06 LAB — CBC
HCT: 35 % — ABNORMAL LOW (ref 39.0–52.0)
Hemoglobin: 10.1 g/dL — ABNORMAL LOW (ref 13.0–17.0)
MCH: 22.5 pg — ABNORMAL LOW (ref 26.0–34.0)
MCHC: 28.9 g/dL — ABNORMAL LOW (ref 30.0–36.0)
MCV: 78 fL — ABNORMAL LOW (ref 80.0–100.0)
Platelets: 323 10*3/uL (ref 150–400)
RBC: 4.49 MIL/uL (ref 4.22–5.81)
RDW: 21.9 % — ABNORMAL HIGH (ref 11.5–15.5)
WBC: 9.7 10*3/uL (ref 4.0–10.5)
nRBC: 0 % (ref 0.0–0.2)

## 2022-03-06 LAB — TROPONIN I (HIGH SENSITIVITY): Troponin I (High Sensitivity): 14 ng/L (ref <18)

## 2022-03-06 NOTE — ED Triage Notes (Signed)
Pt here from home with c/o htn , has been having problems with his b/p for a while now , no cp , some slight sob

## 2022-03-06 NOTE — ED Notes (Signed)
Pt left. 

## 2022-03-07 DIAGNOSIS — I130 Hypertensive heart and chronic kidney disease with heart failure and stage 1 through stage 4 chronic kidney disease, or unspecified chronic kidney disease: Secondary | ICD-10-CM | POA: Diagnosis not present

## 2022-03-07 DIAGNOSIS — J449 Chronic obstructive pulmonary disease, unspecified: Secondary | ICD-10-CM | POA: Diagnosis not present

## 2022-03-07 DIAGNOSIS — I4891 Unspecified atrial fibrillation: Secondary | ICD-10-CM | POA: Diagnosis not present

## 2022-03-07 DIAGNOSIS — R195 Other fecal abnormalities: Secondary | ICD-10-CM | POA: Diagnosis not present

## 2022-03-07 DIAGNOSIS — R531 Weakness: Secondary | ICD-10-CM | POA: Diagnosis not present

## 2022-03-07 DIAGNOSIS — I5033 Acute on chronic diastolic (congestive) heart failure: Secondary | ICD-10-CM | POA: Diagnosis not present

## 2022-03-07 DIAGNOSIS — R0600 Dyspnea, unspecified: Secondary | ICD-10-CM | POA: Diagnosis not present

## 2022-03-07 DIAGNOSIS — D509 Iron deficiency anemia, unspecified: Secondary | ICD-10-CM | POA: Diagnosis not present

## 2022-03-09 ENCOUNTER — Other Ambulatory Visit: Payer: Self-pay | Admitting: Cardiology

## 2022-03-09 ENCOUNTER — Encounter: Payer: Self-pay | Admitting: Cardiology

## 2022-03-09 ENCOUNTER — Encounter: Payer: Medicare PPO | Admitting: Cardiology

## 2022-03-09 VITALS — BP 122/74 | HR 64 | Ht 68.0 in | Wt 162.8 lb

## 2022-03-09 DIAGNOSIS — I4819 Other persistent atrial fibrillation: Secondary | ICD-10-CM | POA: Diagnosis not present

## 2022-03-09 DIAGNOSIS — I10 Essential (primary) hypertension: Secondary | ICD-10-CM | POA: Diagnosis not present

## 2022-03-09 DIAGNOSIS — R0609 Other forms of dyspnea: Secondary | ICD-10-CM | POA: Diagnosis not present

## 2022-03-09 NOTE — Progress Notes (Signed)
Cardiology Office Note:    Date:  03/09/2022   ID:  [name], DOB September 12, 1930, MRN [id number]  PCP:  Burnard Bunting, MD   [facility] Prov[facility] Cardiologist:  Will Meredith Leeds, MD Electrophysiologist:  Will Meredith Leeds, MD     Referring MD: Burnard Bunting, MD   Chief Complaint  Patient presents with   Atrial Fibrillation    History of Present Illness:    Austin [name] is a 88 y.o. male with a hx of atrial fibrillation followed by Dr Curt Bears seen as a work in for recurrent Afib. He was diagnosed with atrial fibrillation in 2017. He was admitted to the hospital for dofetilide load and converted to sinus rhythm.  Unfortunately had more frequent episodes of atrial fibrillation.  Dofetilide was stopped and he was started on Multaq but continued to have Afib.  He has since been started on amiodarone. Last DCCV on amiodarone on March 1. When seen by EP in August was in NSR.  Today he is seen with his wife. Notes he fell in July and had a small SDH. Later anticoagulation resumed. When seen in August BP was low and amlodipine was discontinued. More recently he has felt poorly. Notes increased SOB and severe fatigue/weakness. BP was very high. Did fall twice yesterday and injured his right ribs. Did go to the ED on 10/22. Found to be in Afib with controlled rate. Patient elected not to stay. Wife reports recent severe anemia with Hgb down to 7.4. given 2 units transfusion by PCP. Hgb up to 10.1 in ED. Was started back on amlodipine for BP titrated to 7.5 mg daily. Also has hydralazine to take PRN BP > 160. Reports he felt better when in NSR.   Past Medical History:  Diagnosis Date   Allergy    Asthma    BPH (benign prostatic hypertrophy)    CKD (chronic kidney disease) stage 3, GFR 30-59 ml/min (HCC)    Cough    Gout    Hypertensive chronic kidney disease    Melanoma in situ of right eyelid ([place])    OSA (obstructive sleep apnea)    Persistent atrial  fibrillation ([place])     Past Surgical History:  Procedure Laterality Date   CARDIOVERSION [name] 06/05/2015   Procedure: CARDIOVERSION;  Surgeon: Sanda Klein, MD;  Location: [facility];  Service: Cardiovascular;  Laterality: [name];   CARDIOVERSION [name] 01/04/2021   Procedure: CARDIOVERSION;  Surgeon: [name], MD;  Location: [facility];  Service: Cardiovascular;  Laterality: [name];   CARDIOVERSION [name] 02/08/2021   Procedure: CARDIOVERSION;  Surgeon: Jerline Pain, MD;  Location: [facility];  Service: Cardiovascular;  Laterality: [name];   CARDIOVERSION [name] 04/14/2021   Procedure: CARDIOVERSION;  Surgeon: Freada Bergeron, MD;  Location: [facility];  Service: Cardiovascular;  Laterality: [name];   CARDIOVERSION [name] 07/05/2021   Procedure: CARDIOVERSION;  Surgeon: Fay Records, MD;  Location: [facility];  Service: Cardiovascular;  Laterality: [name];   EYE SURGERY  12/2017    Current Medications: Current Meds  Medication Sig   albuterol (VENTOLIN HFA) 108 (90 Base) MCG/ACT inhaler Inhale 2 puffs into the lungs every 4 (four) hours as needed for wheezing or shortness of breath (cough).   amiodarone (PACERONE) 200 MG tablet TAKE 1 TABLET BY MOUTH EVERY DAY   amLODipine (NORVASC) 2.5 MG tablet Take 2.5 mg by mouth daily. 3 tabs daily   budesonide-formoterol (SYMBICORT) 80-4.5 MCG/ACT inhaler Inhale 2 puffs into the lungs 2 (two) times daily.  carvedilol (COREG) 6.25 MG tablet Take 1 tablet (6.25 mg total) by mouth 2 (two) times daily.   EPINEPHrine 0.3 mg/0.3 mL IJ SOAJ injection Inject 0.3 mg into the muscle as needed for anaphylaxis.   furosemide (LASIX) 20 MG tablet TAKE 1 TABLET BY MOUTH EVERY DAY   hydrALAZINE (APRESOLINE) 10 MG tablet Take by mouth. Take only if BP is over 160/90.   isosorbide mononitrate (IMDUR) 60 MG 24 hr tablet TAKE 1 TABLET BY MOUTH EVERY DAY   KLOR-CON M20 20 MEQ tablet TAKE 1 TABLET BY MOUTH EVERY DAY   MAGNESIUM CITRATE PO Take 250 mg by mouth at  bedtime.   olmesartan (BENICAR) 40 MG tablet TAKE 1 TABLET EVERY DAY **REPLACES LOSARTAN**   Rivaroxaban (XARELTO) 15 MG TABS tablet Take 1 tablet (15 mg total) by mouth daily with supper. DO NOT RESTART until 11/22/2021   sertraline (ZOLOFT) 50 MG tablet Take 50 mg by mouth at bedtime.   tamsulosin (FLOMAX) 0.4 MG CAPS capsule Take 0.4 mg by mouth daily.   triamcinolone (NASACORT ALLERGY 24HR) 55 MCG/ACT AERO nasal inhaler Place 2 sprays into the nose daily.   valACYclovir (VALTREX) 1000 MG tablet Take 1,000 mg by mouth at bedtime.     Allergies:   Bee venom, Codeine, Iodine, Oxycodone, Penicillins, Benadryl [diphenhydramine], Diltiazem, Diatrizoate, Ivp dye [iodinated contrast media], Tape, and Yohimbine   Social History   Socioeconomic History   Marital status: Married    Spouse name: [name]   Number of children: Not on file   Years of education: Not on file   Highest education level: Not on file  Occupational History   Not on file  Tobacco Use   Smoking status: Never   Smokeless tobacco: Never  Vaping Use   Vaping Use: Never used  Substance and Sexual Activity   Alcohol use: Yes    Alcohol/week: 1.0 standard drink of alcohol    Types: 1 Standard drinks or equivalent per week    Comment: occ   Drug use: No   Sexual activity: Not on file  Other Topics Concern   Not on file  Social History Narrative   Right Handed.  Caffiene  Coffee, tea 3 cups daily.   Pt retired.     Lives at home with wife.     Social Determinants of Health   Financial Resource Strain: Not on file  Food Insecurity: Not on file  Transportation Needs: Not on file  Physical Activity: Not on file  Stress: Not on file  Social Connections: Not on file     Family History: The patient's family history includes Hypertension in his father and mother; Lung cancer in his father. There is no history of Sleep apnea.  ROS:   Please see the history of present illness.     All other systems reviewed and are  negative.  EKGs/Labs/Other Studies Reviewed:    The following studies were reviewed today: Echo 11/15/21: IMPRESSIONS     1. Compared to 10/09/19 aortic stenosis is slightly worse.   2. Left ventricular ejection fraction, by estimation, is 60 to 65%. The  left ventricle has normal function. The left ventricle has no regional  wall motion abnormalities. There is mild left ventricular hypertrophy.  Left ventricular diastolic parameters  are consistent with Grade II diastolic dysfunction (pseudonormalization).  Elevated left atrial pressure.   3. Right ventricular systolic function is normal. The right ventricular  size is normal.   4. Left atrial size was severely dilated.  5. The mitral valve is normal in structure. Mild mitral valve  regurgitation. No evidence of mitral stenosis.   6. The aortic valve is calcified. Aortic valve regurgitation is mild.  Mild to moderate aortic valve stenosis.   7. Aortic dilatation noted. There is borderline dilatation of the aortic  root, measuring 38 mm.   8. The inferior vena cava is normal in size with greater than 50%  respiratory variability, suggesting right atrial pressure of 3 mmHg.   EKG:  EKG is not ordered today.  The ekg ordered 10/22 demonstrates AFib rate 77. I have personally reviewed and interpreted this study.   Recent Labs: 11/23/2021: ALT 12; TSH 2.220 03/06/2022: BUN 14; Creatinine, Ser 1.41; Hemoglobin 10.1; Platelets 323; Potassium 3.6; Sodium 139  Recent Lipid Panel No results found for: "CHOL", "TRIG", "HDL", "CHOLHDL", "VLDL", "LDLCALC", "LDLDIRECT"   Risk Assessment/Calculations:    CHA2DS2-VASc Score = 3   This indicates a 3.2% annual risk of stroke. The patient's score is based upon: CHF History: 0 HTN History: 1 Diabetes History: 0 Stroke History: 0 Vascular Disease History: 0 Age Score: 2 Gender Score: 0               Physical Exam:    VS:  BP 122/74 (BP Location: Left Arm, Patient Position: Sitting,  Cuff Size: Normal)   Pulse 64   Ht '5\' 8"'$  (1.727 m)   Wt 162 lb 12.8 oz (73.8 kg)   SpO2 97%   BMI 24.75 kg/m     Wt Readings from Last 3 Encounters:  03/09/22 162 lb 12.8 oz (73.8 kg)  01/03/22 160 lb (72.6 kg)  11/23/21 158 lb 12.8 oz (72 kg)     GEN: elderly WM, frail appearing.   HEENT: Normal NECK: No JVD; No carotid bruits LYMPHATICS: No lymphadenopathy CARDIAC: IRRR, gr 2/6 systolic murmur RUSB RESPIRATORY:  Clear to auscultation without rales, wheezing or rhonchi  ABDOMEN: Soft, non-tender, non-distended MUSCULOSKELETAL:  No edema; No deformity  SKIN: Warm and dry NEUROLOGIC:  Alert and oriented x 3 PSYCHIATRIC:  Normal affect   ASSESSMENT:    1. Persistent atrial fibrillation ([place])   2. Primary hypertension   3. Dyspnea on exertion    PLAN:    In order of problems listed above:  Recurrent and persistent Afib. Last DCCV on March 1 on amiodarone. Multiple cardioversions in past on other therapy. Has been on anticoagulation without missed doses. He does appear to be symptomatic although severe HTN and anemia may also be contributing to symptoms. At this point the only thing we can offer is repeat cardioversion.  He is willing to proceed. Will follow up in Afib clinic.  Labile HTN. BP looks good today. Continue current therapy with prn hydralazine. Severe anemia. S/p transfusion. Per PCP History of falls.  Mild to moderate AS      Shared Decision Making/Informed Consent The risks (stroke, cardiac arrhythmias rarely resulting in the need for a temporary or permanent pacemaker, skin irritation or burns and complications associated with conscious sedation including aspiration, arrhythmia, respiratory failure and death), benefits (restoration of normal sinus rhythm) and alternatives of a direct current cardioversion were explained in detail to Mr. Nazir and he agrees to proceed.      Medication Adjustments/Labs and Tests Ordered: Current medicines are reviewed at  length with the patient today.  Concerns regarding medicines are outlined above.  No orders of the defined types were placed in this encounter.  No orders of the defined types were placed  in this encounter.   Patient Instructions  Medication Instructions:  Continue all medications   Lab Work: None ordered   Testing/Procedures: Cardioversion      Follow instructions below   Follow-Up: At [facility], you and your health needs are our priority.  As part of our continuing mission to provide you with exceptional heart care, we have created designated Provider Care Teams.  These Care Teams include your primary Cardiologist (physician) and Advanced Practice Providers (APPs -  Physician Assistants and Nurse Practitioners) who all work together to provide you with the care you need, when you need it.  We recommend signing up for the patient portal called "MyChart".  Sign up information is provided on this After Visit Summary.  MyChart is used to connect with patients for Virtual Visits (Telemedicine).  Patients are able to view lab/test results, encounter notes, upcoming appointments, etc.  Non-urgent messages can be sent to your provider as well.   To learn more about what you can do with MyChart, go to [email or web address].    Your next appointment:  As Needed    The format for your next appointment: Office    Provider: Dr.Ruthene Methvin  Keep appointment with Roderic Palau PA [facility] 12/5 at 11:30 am     You are scheduled for a Cardioversion on Tuesday 03/15/22 with Dr. Radford Pax.  Please arrive at the [facility] : [facility] (Main Entrance A) at [facility]: [address] [place], [place] [address] at 11:45 am.   DIET: Nothing to eat or drink after midnight except a sip of water with medications (see medication instructions below)  FYI: For your safety, and to allow [place] to monitor your vital signs accurately during the surgery/procedure we request that   if you have  artificial nails, gel coating, SNS etc. Please have those removed prior to your surgery/procedure. Not having the nail coverings /polish removed may result in cancellation or delay of your surgery/procedure.   Medication Instructions:   Continue your anticoagulant: Xarelto You will need to continue your anticoagulant after your procedure until you  are told by your  Provider that it is safe to stop   Labs: None needed    You must have a responsible person to drive you home and stay in the waiting area during your procedure. Failure to do so could result in cancellation.  Bring your insurance cards.  *Special Note: Every effort is made to have your procedure done on time. Occasionally there are emergencies that occur at the hospital that may cause delays. Please be patient if a delay does occur.   Important Information About Sugar         Signed, Manu Rubey [place], MD  03/09/2022 12:39 PM    [place]

## 2022-03-09 NOTE — Patient Instructions (Addendum)
Medication Instructions:  Continue all medications   Lab Work: None ordered   Testing/Procedures: Cardioversion      Follow instructions below   Follow-Up: At [facility], you and your health needs are our priority.  As part of our continuing mission to provide you with exceptional heart care, we have created designated Provider Care Teams.  These Care Teams include your primary Cardiologist (physician) and Advanced Practice Providers (APPs -  Physician Assistants and Nurse Practitioners) who all work together to provide you with the care you need, when you need it.  We recommend signing up for the patient portal called "MyChart".  Sign up information is provided on this After Visit Summary.  MyChart is used to connect with patients for Virtual Visits (Telemedicine).  Patients are able to view lab/test results, encounter notes, upcoming appointments, etc.  Non-urgent messages can be sent to your provider as well.   To learn more about what you can do with MyChart, go to [email or web address].    Your next appointment:  As Needed    The format for your next appointment: Office    Provider: Dr.Jordan  Keep appointment with Roderic Palau PA [facility] 12/5 at 11:30 am     You are scheduled for a Cardioversion on Tuesday 03/15/22 with Dr. Radford Pax.  Please arrive at the [facility] (Main Entrance A) at [facility]: [address] [place], [place] [address] at 11:45 am.   DIET: Nothing to eat or drink after midnight except a sip of water with medications (see medication instructions below)  FYI: For your safety, and to allow [place] to monitor your vital signs accurately during the surgery/procedure we request that   if you have artificial nails, gel coating, SNS etc. Please have those removed prior to your surgery/procedure. Not having the nail coverings /polish removed may result in cancellation or delay of your surgery/procedure.   Medication  Instructions:   Continue your anticoagulant: Xarelto You will need to continue your anticoagulant after your procedure until you  are told by your  Provider that it is safe to stop   Labs: None needed    You must have a responsible person to drive you home and stay in the waiting area during your procedure. Failure to do so could result in cancellation.  Bring your insurance cards.  *Special Note: Every effort is made to have your procedure done on time. Occasionally there are emergencies that occur at the hospital that may cause delays. Please be patient if a delay does occur.   Important Information About Sugar

## 2022-03-09 NOTE — H&P (View-Only) (Signed)
Cardiology Office Note:    Date:  03/09/2022   ID:  Austin [name], DOB May 04, 1931, MRN [id number]  PCP:  Burnard Bunting, MD   [facility] Cardiologist:  Will Meredith Leeds, MD Electrophysiologist:  Will Meredith Leeds, MD     Referring MD: Burnard Bunting, MD   Chief Complaint  Patient presents with   Atrial Fibrillation    History of Present Illness:    Austin [name] is a 88 y.o. male with a hx of atrial fibrillation followed by Dr Curt Bears seen as a work in for recurrent Afib. He was diagnosed with atrial fibrillation in 2017. He was admitted to the hospital for dofetilide load and converted to sinus rhythm.  Unfortunately had more frequent episodes of atrial fibrillation.  Dofetilide was stopped and he was started on Multaq but continued to have Afib.  He has since been started on amiodarone. Last DCCV on amiodarone on March 1. When seen by EP in August was in NSR.  Today he is seen with his wife. Notes he fell in July and had a small SDH. Later anticoagulation resumed. When seen in August BP was low and amlodipine was discontinued. More recently he has felt poorly. Notes increased SOB and severe fatigue/weakness. BP was very high. Did fall twice yesterday and injured his right ribs. Did go to the ED on 10/22. Found to be in Afib with controlled rate. Patient elected not to stay. Wife reports recent severe anemia with Hgb down to 7.4. given 2 units transfusion by PCP. Hgb up to 10.1 in ED. Was started back on amlodipine for BP titrated to 7.5 mg daily. Also has hydralazine to take PRN BP > 160. Reports he felt better when in NSR.   Past Medical History:  Diagnosis Date   Allergy    Asthma    BPH (benign prostatic hypertrophy)    CKD (chronic kidney disease) stage 3, GFR 30-59 ml/min (HCC)    Cough    Gout    Hypertensive chronic kidney disease    Melanoma in situ of right eyelid ([place])    OSA (obstructive sleep apnea)    Persistent atrial  fibrillation ([place])     Past Surgical History:  Procedure Laterality Date   CARDIOVERSION [name] 06/05/2015   Procedure: CARDIOVERSION;  Surgeon: Sanda Klein, MD;  Location: [facility];  Service: Cardiovascular;  Laterality: [name];   CARDIOVERSION [name] 01/04/2021   Procedure: CARDIOVERSION;  Surgeon: [name], MD;  Location: [facility];  Service: Cardiovascular;  Laterality: [name];   CARDIOVERSION [name] 02/08/2021   Procedure: CARDIOVERSION;  Surgeon: Jerline Pain, MD;  Location: [facility];  Service: Cardiovascular;  Laterality: [name];   CARDIOVERSION [name] 04/14/2021   Procedure: CARDIOVERSION;  Surgeon: Freada Bergeron, MD;  Location: [facility];  Service: Cardiovascular;  Laterality: [name];   CARDIOVERSION [name] 07/05/2021   Procedure: CARDIOVERSION;  Surgeon: Fay Records, MD;  Location: [facility];  Service: Cardiovascular;  Laterality: [name];   EYE SURGERY  12/2017    Current Medications: Current Meds  Medication Sig   albuterol (VENTOLIN HFA) 108 (90 Base) MCG/ACT inhaler Inhale 2 puffs into the lungs every 4 (four) hours as needed for wheezing or shortness of breath (cough).   amiodarone (PACERONE) 200 MG tablet TAKE 1 TABLET BY MOUTH EVERY DAY   amLODipine (NORVASC) 2.5 MG tablet Take 2.5 mg by mouth daily. 3 tabs daily   budesonide-formoterol (SYMBICORT) 80-4.5 MCG/ACT inhaler Inhale 2 puffs into the lungs 2 (two) times daily.  carvedilol (COREG) 6.25 MG tablet Take 1 tablet (6.25 mg total) by mouth 2 (two) times daily.   EPINEPHrine 0.3 mg/0.3 mL IJ SOAJ injection Inject 0.3 mg into the muscle as needed for anaphylaxis.   furosemide (LASIX) 20 MG tablet TAKE 1 TABLET BY MOUTH EVERY DAY   hydrALAZINE (APRESOLINE) 10 MG tablet Take by mouth. Take only if BP is over 160/90.   isosorbide mononitrate (IMDUR) 60 MG 24 hr tablet TAKE 1 TABLET BY MOUTH EVERY DAY   KLOR-CON M20 20 MEQ tablet TAKE 1 TABLET BY MOUTH EVERY DAY   MAGNESIUM CITRATE PO Take 250 mg by mouth at  bedtime.   olmesartan (BENICAR) 40 MG tablet TAKE 1 TABLET EVERY DAY **REPLACES LOSARTAN**   Rivaroxaban (XARELTO) 15 MG TABS tablet Take 1 tablet (15 mg total) by mouth daily with supper. DO NOT RESTART until 11/22/2021   sertraline (ZOLOFT) 50 MG tablet Take 50 mg by mouth at bedtime.   tamsulosin (FLOMAX) 0.4 MG CAPS capsule Take 0.4 mg by mouth daily.   triamcinolone (NASACORT ALLERGY 24HR) 55 MCG/ACT AERO nasal inhaler Place 2 sprays into the nose daily.   valACYclovir (VALTREX) 1000 MG tablet Take 1,000 mg by mouth at bedtime.     Allergies:   Bee venom, Codeine, Iodine, Oxycodone, Penicillins, Benadryl [diphenhydramine], Diltiazem, Diatrizoate, Ivp dye [iodinated contrast media], Tape, and Yohimbine   Social History   Socioeconomic History   Marital status: Married    Spouse name: [name]   Number of children: Not on file   Years of education: Not on file   Highest education level: Not on file  Occupational History   Not on file  Tobacco Use   Smoking status: Never   Smokeless tobacco: Never  Vaping Use   Vaping Use: Never used  Substance and Sexual Activity   Alcohol use: Yes    Alcohol/week: 1.0 standard drink of alcohol    Types: 1 Standard drinks or equivalent per week    Comment: occ   Drug use: No   Sexual activity: Not on file  Other Topics Concern   Not on file  Social History Narrative   Right Handed.  Caffiene  Coffee, tea 3 cups daily.   Pt retired.     Lives at home with wife.     Social Determinants of Health   Financial Resource Strain: Not on file  Food Insecurity: Not on file  Transportation Needs: Not on file  Physical Activity: Not on file  Stress: Not on file  Social Connections: Not on file     Family History: The patient's family history includes Hypertension in his father and mother; Lung cancer in his father. There is no history of Sleep apnea.  ROS:   Please see the history of present illness.     All other systems reviewed and are  negative.  EKGs/Labs/Other Studies Reviewed:    The following studies were reviewed today: Echo 11/15/21: IMPRESSIONS     1. Compared to 10/09/19 aortic stenosis is slightly worse.   2. Left ventricular ejection fraction, by estimation, is 60 to 65%. The  left ventricle has normal function. The left ventricle has no regional  wall motion abnormalities. There is mild left ventricular hypertrophy.  Left ventricular diastolic parameters  are consistent with Grade II diastolic dysfunction (pseudonormalization).  Elevated left atrial pressure.   3. Right ventricular systolic function is normal. The right ventricular  size is normal.   4. Left atrial size was severely dilated.  5. The mitral valve is normal in structure. Mild mitral valve  regurgitation. No evidence of mitral stenosis.   6. The aortic valve is calcified. Aortic valve regurgitation is mild.  Mild to moderate aortic valve stenosis.   7. Aortic dilatation noted. There is borderline dilatation of the aortic  root, measuring 38 mm.   8. The inferior vena cava is normal in size with greater than 50%  respiratory variability, suggesting right atrial pressure of 3 mmHg.   EKG:  EKG is not ordered today.  The ekg ordered 10/22 demonstrates AFib rate 77. I have personally reviewed and interpreted this study.   Recent Labs: 11/23/2021: ALT 12; TSH 2.220 03/06/2022: BUN 14; Creatinine, Ser 1.41; Hemoglobin 10.1; Platelets 323; Potassium 3.6; Sodium 139  Recent Lipid Panel No results found for: "CHOL", "TRIG", "HDL", "CHOLHDL", "VLDL", "LDLCALC", "LDLDIRECT"   Risk Assessment/Calculations:    CHA2DS2-VASc Score = 3   This indicates a 3.2% annual risk of stroke. The patient's score is based upon: CHF History: 0 HTN History: 1 Diabetes History: 0 Stroke History: 0 Vascular Disease History: 0 Age Score: 2 Gender Score: 0               Physical Exam:    VS:  BP 122/74 (BP Location: Left Arm, Patient Position: Sitting,  Cuff Size: Normal)   Pulse 64   Ht '5\' 8"'$  (1.727 m)   Wt 162 lb 12.8 oz (73.8 kg)   SpO2 97%   BMI 24.75 kg/m     Wt Readings from Last 3 Encounters:  03/09/22 162 lb 12.8 oz (73.8 kg)  01/03/22 160 lb (72.6 kg)  11/23/21 158 lb 12.8 oz (72 kg)     GEN: elderly WM, frail appearing.   HEENT: Normal NECK: No JVD; No carotid bruits LYMPHATICS: No lymphadenopathy CARDIAC: IRRR, gr 2/6 systolic murmur RUSB RESPIRATORY:  Clear to auscultation without rales, wheezing or rhonchi  ABDOMEN: Soft, non-tender, non-distended MUSCULOSKELETAL:  No edema; No deformity  SKIN: Warm and dry NEUROLOGIC:  Alert and oriented x 3 PSYCHIATRIC:  Normal affect   ASSESSMENT:    1. Persistent atrial fibrillation ([place])   2. Primary hypertension   3. Dyspnea on exertion    PLAN:    In order of problems listed above:  Recurrent and persistent Afib. Last DCCV on March 1 on amiodarone. Multiple cardioversions in past on other therapy. Has been on anticoagulation without missed doses. He does appear to be symptomatic although severe HTN and anemia may also be contributing to symptoms. At this point the only thing we can offer is repeat cardioversion.  He is willing to proceed. Will follow up in Afib clinic.  Labile HTN. BP looks good today. Continue current therapy with prn hydralazine. Severe anemia. S/p transfusion. Per PCP History of falls.  Mild to moderate AS      Shared Decision Making/Informed Consent The risks (stroke, cardiac arrhythmias rarely resulting in the need for a temporary or permanent pacemaker, skin irritation or burns and complications associated with conscious sedation including aspiration, arrhythmia, respiratory failure and death), benefits (restoration of normal sinus rhythm) and alternatives of a direct current cardioversion were explained in detail to Mr. Mangas and he agrees to proceed.      Medication Adjustments/Labs and Tests Ordered: Current medicines are reviewed at  length with the patient today.  Concerns regarding medicines are outlined above.  No orders of the defined types were placed in this encounter.  No orders of the defined types were placed  in this encounter.   Patient Instructions  Medication Instructions:  Continue all medications   Lab Work: None ordered   Testing/Procedures: Cardioversion      Follow instructions below   Follow-Up: At [facility], you and your health needs are our priority.  As part of our continuing mission to provide you with exceptional heart care, we have created designated Provider Care Teams.  These Care Teams include your primary Cardiologist (physician) and Advanced Practice Providers (APPs -  Physician Assistants and Nurse Practitioners) who all work together to provide you with the care you need, when you need it.  We recommend signing up for the patient portal called "MyChart".  Sign up information is provided on this After Visit Summary.  MyChart is used to connect with patients for Virtual Visits (Telemedicine).  Patients are able to view lab/test results, encounter notes, upcoming appointments, etc.  Non-urgent messages can be sent to your provider as well.   To learn more about what you can do with MyChart, go to [email or web address].    Your next appointment:  As Needed    The format for your next appointment: Office    Provider: Dr.Brigid Vandekamp  Keep appointment with Roderic Palau PA [facility] 12/5 at 11:30 am     You are scheduled for a Cardioversion on Tuesday 03/15/22 with Dr. Radford Pax.  Please arrive at the [facility] (Main Entrance A) at [facility]: [address] [place], [place] [address] at 11:45 am.   DIET: Nothing to eat or drink after midnight except a sip of water with medications (see medication instructions below)  FYI: For your safety, and to allow [place] to monitor your vital signs accurately during the surgery/procedure we request that   if you have  artificial nails, gel coating, SNS etc. Please have those removed prior to your surgery/procedure. Not having the nail coverings /polish removed may result in cancellation or delay of your surgery/procedure.   Medication Instructions:   Continue your anticoagulant: Xarelto You will need to continue your anticoagulant after your procedure until you  are told by your  Provider that it is safe to stop   Labs: None needed    You must have a responsible person to drive you home and stay in the waiting area during your procedure. Failure to do so could result in cancellation.  Bring your insurance cards.  *Special Note: Every effort is made to have your procedure done on time. Occasionally there are emergencies that occur at the hospital that may cause delays. Please be patient if a delay does occur.   Important Information About Sugar         Signed, Caven Perine Martinique, MD  03/09/2022 12:39 PM    [place]

## 2022-03-11 DIAGNOSIS — R0600 Dyspnea, unspecified: Secondary | ICD-10-CM | POA: Diagnosis not present

## 2022-03-11 DIAGNOSIS — I4891 Unspecified atrial fibrillation: Secondary | ICD-10-CM | POA: Diagnosis not present

## 2022-03-11 DIAGNOSIS — W108XXA Fall (on) (from) other stairs and steps, initial encounter: Secondary | ICD-10-CM | POA: Diagnosis not present

## 2022-03-11 DIAGNOSIS — I130 Hypertensive heart and chronic kidney disease with heart failure and stage 1 through stage 4 chronic kidney disease, or unspecified chronic kidney disease: Secondary | ICD-10-CM | POA: Diagnosis not present

## 2022-03-11 DIAGNOSIS — D509 Iron deficiency anemia, unspecified: Secondary | ICD-10-CM | POA: Diagnosis not present

## 2022-03-11 DIAGNOSIS — I5032 Chronic diastolic (congestive) heart failure: Secondary | ICD-10-CM | POA: Diagnosis not present

## 2022-03-11 DIAGNOSIS — R531 Weakness: Secondary | ICD-10-CM | POA: Diagnosis not present

## 2022-03-11 DIAGNOSIS — R0781 Pleurodynia: Secondary | ICD-10-CM | POA: Diagnosis not present

## 2022-03-15 ENCOUNTER — Ambulatory Visit (HOSPITAL_COMMUNITY)
Admission: RE | Admit: 2022-03-15 | Discharge: 2022-03-15 | Disposition: A | Discharge status: Home / Self Care | Payer: Medicare PPO | Attending: Cardiology | Admitting: Cardiology

## 2022-03-15 ENCOUNTER — Encounter (HOSPITAL_COMMUNITY)
Admission: RE | Disposition: A | Discharge status: Home / Self Care | Payer: Self-pay | Source: Home / Self Care | Attending: Cardiology

## 2022-03-15 ENCOUNTER — Ambulatory Visit (HOSPITAL_COMMUNITY): Payer: Medicare PPO | Admitting: Anesthesiology

## 2022-03-15 ENCOUNTER — Ambulatory Visit (HOSPITAL_BASED_OUTPATIENT_CLINIC_OR_DEPARTMENT_OTHER): Payer: Medicare PPO | Admitting: Anesthesiology

## 2022-03-15 DIAGNOSIS — I129 Hypertensive chronic kidney disease with stage 1 through stage 4 chronic kidney disease, or unspecified chronic kidney disease: Secondary | ICD-10-CM | POA: Diagnosis not present

## 2022-03-15 DIAGNOSIS — N183 Chronic kidney disease, stage 3 unspecified: Secondary | ICD-10-CM

## 2022-03-15 DIAGNOSIS — D631 Anemia in chronic kidney disease: Secondary | ICD-10-CM

## 2022-03-15 DIAGNOSIS — I4891 Unspecified atrial fibrillation: Secondary | ICD-10-CM

## 2022-03-15 DIAGNOSIS — Z7901 Long term (current) use of anticoagulants: Secondary | ICD-10-CM | POA: Insufficient documentation

## 2022-03-15 DIAGNOSIS — I4819 Other persistent atrial fibrillation: Secondary | ICD-10-CM | POA: Insufficient documentation

## 2022-03-15 DIAGNOSIS — I491 Atrial premature depolarization: Secondary | ICD-10-CM | POA: Diagnosis not present

## 2022-03-15 DIAGNOSIS — G4730 Sleep apnea, unspecified: Secondary | ICD-10-CM | POA: Insufficient documentation

## 2022-03-15 DIAGNOSIS — J449 Chronic obstructive pulmonary disease, unspecified: Secondary | ICD-10-CM | POA: Insufficient documentation

## 2022-03-15 DIAGNOSIS — N189 Chronic kidney disease, unspecified: Secondary | ICD-10-CM | POA: Diagnosis not present

## 2022-03-15 DIAGNOSIS — I10 Essential (primary) hypertension: Secondary | ICD-10-CM

## 2022-03-15 HISTORY — PX: CARDIOVERSION: SHX1299

## 2022-03-15 SURGERY — CARDIOVERSION
Anesthesia: General

## 2022-03-15 MED ORDER — PROPOFOL 10 MG/ML IV BOLUS
INTRAVENOUS | Status: DC | PRN
  Administered 2022-03-15: 60 mg via INTRAVENOUS

## 2022-03-15 MED ORDER — SODIUM CHLORIDE 0.9 % IV SOLN: INTRAVENOUS | Status: DC

## 2022-03-15 MED ORDER — FUROSEMIDE 20 MG PO TABS: 20.0000 mg | ORAL_TABLET | ORAL | Status: DC

## 2022-03-15 MED ORDER — LIDOCAINE 2% (20 MG/ML) 5 ML SYRINGE
INTRAMUSCULAR | Status: DC | PRN
  Administered 2022-03-15: 20 mg via INTRAVENOUS

## 2022-03-15 NOTE — CV Procedure (Signed)
    Electrical Cardioversion Procedure Note TAEGEN DELKER [id number] 1930/12/16  Procedure: Electrical Cardioversion Indications:  Atrial Fibrillation  Time Out: Verified patient identification, verified procedure,medications/allergies/relevent history reviewed, required imaging and test results available.  Performed  Procedure Details  The patient was NPO after midnight. Anesthesia was administered at the beside  by Dr.Fitzgerald with '60mg'$  of propofol and '20mg'$  Lidocaine.  Cardioversion was done with synchronized biphasic defibrillation with AP pads with 150watts.  The patient converted to normal sinus rhythm. The patient tolerated the procedure well   IMPRESSION:  Successful cardioversion of atrial fibrillation    Jere Vanburen 03/15/2022, 9:39 AM

## 2022-03-15 NOTE — Anesthesia Preprocedure Evaluation (Signed)
Anesthesia Evaluation  Patient identified by MRN, date of birth, ID band Patient awake    Reviewed: Allergy & Precautions, NPO status , Patient's Chart, lab work & pertinent test results  Airway Mallampati: II  TM Distance: >3 FB     Dental  (+) Dental Advisory Given   Pulmonary asthma , sleep apnea , COPD,    breath sounds clear to auscultation       Cardiovascular hypertension, Pt. on home beta blockers and Pt. on medications + dysrhythmias + Valvular Problems/Murmurs AS  Rhythm:Irregular Rate:Normal  Normal EF. Mild AS. Mean gradient 17mHg    Neuro/Psych negative neurological ROS     GI/Hepatic negative GI ROS, Neg liver ROS,   Endo/Other  negative endocrine ROS  Renal/GU CRFRenal disease     Musculoskeletal   Abdominal   Peds  Hematology  (+) Blood dyscrasia, anemia ,   Anesthesia Other Findings   Reproductive/Obstetrics                             Anesthesia Physical Anesthesia Plan  ASA: 3  Anesthesia Plan: General   Post-op Pain Management:    Induction: Intravenous  PONV Risk Score and Plan: 2 and Treatment may vary due to age or medical condition  Airway Management Planned: Mask and Natural Airway  Additional Equipment:   Intra-op Plan:   Post-operative Plan:   Informed Consent: I have reviewed the patients History and Physical, chart, labs and discussed the procedure including the risks, benefits and alternatives for the proposed anesthesia with the patient or authorized representative who has indicated his/her understanding and acceptance.       Plan Discussed with:   Anesthesia Plan Comments:         Anesthesia Quick Evaluation

## 2022-03-15 NOTE — Interval H&P Note (Signed)
History and Physical Interval Note:  03/15/2022 12:03 PM  Austin [name]  has presented today for surgery, with the diagnosis of atrial fibrillation.  The various methods of treatment have been discussed with the patient and family. After consideration of risks, benefits and other options for treatment, the patient has consented to  Procedure(s): CARDIOVERSION ([name]) as a surgical intervention.  The patient's history has been reviewed, patient examined, no change in status, stable for surgery.  I have reviewed the patient's chart and labs.  Questions were answered to the patient's satisfaction.     Fransico Him

## 2022-03-15 NOTE — Transfer of Care (Signed)
Immediate Anesthesia Transfer of Care Note  Patient: Austin [name]  Procedure(s) Performed: CARDIOVERSION  Patient Location: Endoscopy Unit  Anesthesia Type:General  Level of Consciousness: drowsy  Airway & Oxygen Therapy: Patient Spontanous Breathing  Post-op Assessment: Report given to RN and Post -op Vital signs reviewed and stable  Post vital signs: Reviewed and stable  Last Vitals:  Vitals Value Taken Time  BP    Temp    Pulse    Resp    SpO2      Last Pain:  Vitals:   03/15/22 1213  TempSrc: Temporal  PainSc: 0-No pain         Complications: No notable events documented.

## 2022-03-16 ENCOUNTER — Other Ambulatory Visit (HOSPITAL_COMMUNITY): Payer: Self-pay | Admitting: *Deleted

## 2022-03-16 ENCOUNTER — Encounter (HOSPITAL_COMMUNITY): Payer: Self-pay | Admitting: Cardiology

## 2022-03-16 NOTE — Anesthesia Postprocedure Evaluation (Signed)
Anesthesia Post Note  Patient: Austin [name]  Procedure(s) Performed: CARDIOVERSION     Patient location during evaluation: PACU Anesthesia Type: General Level of consciousness: awake and alert Pain management: pain level controlled Vital Signs Assessment: post-procedure vital signs reviewed and stable Respiratory status: spontaneous breathing, nonlabored ventilation, respiratory function stable and patient connected to nasal cannula oxygen Cardiovascular status: blood pressure returned to baseline and stable Postop Assessment: no apparent nausea or vomiting Anesthetic complications: no   No notable events documented.  Last Vitals:  Vitals:   03/15/22 1240 03/15/22 1250  BP: (!) 140/82 (!) 155/92  Pulse: 83 80  Resp: (!) 21 18  Temp:    SpO2: 95% 94%    Last Pain:  Vitals:   03/15/22 1250  TempSrc:   PainSc: 0-No pain                 Tiajuana Amass

## 2022-03-17 ENCOUNTER — Encounter (HOSPITAL_COMMUNITY)
Admission: RE | Admit: 2022-03-17 | Discharge: 2022-03-17 | Disposition: A | Discharge status: Home / Self Care | Payer: Medicare PPO | Source: Ambulatory Visit | Attending: Internal Medicine | Admitting: Internal Medicine

## 2022-03-17 DIAGNOSIS — R0609 Other forms of dyspnea: Secondary | ICD-10-CM | POA: Insufficient documentation

## 2022-03-17 DIAGNOSIS — I4819 Other persistent atrial fibrillation: Secondary | ICD-10-CM | POA: Insufficient documentation

## 2022-03-17 DIAGNOSIS — I10 Essential (primary) hypertension: Secondary | ICD-10-CM | POA: Insufficient documentation

## 2022-03-17 DIAGNOSIS — D649 Anemia, unspecified: Secondary | ICD-10-CM | POA: Diagnosis present

## 2022-03-17 MED ORDER — SODIUM CHLORIDE 0.9 % IV SOLN
510.0000 mg | INTRAVENOUS | Status: DC
  Administered 2022-03-17: 510 mg via INTRAVENOUS
  Filled 2022-03-17: qty 17

## 2022-03-22 DIAGNOSIS — J449 Chronic obstructive pulmonary disease, unspecified: Secondary | ICD-10-CM | POA: Diagnosis not present

## 2022-03-22 DIAGNOSIS — J189 Pneumonia, unspecified organism: Secondary | ICD-10-CM | POA: Diagnosis not present

## 2022-03-22 DIAGNOSIS — I5032 Chronic diastolic (congestive) heart failure: Secondary | ICD-10-CM | POA: Diagnosis not present

## 2022-03-22 DIAGNOSIS — R051 Acute cough: Secondary | ICD-10-CM | POA: Diagnosis not present

## 2022-03-22 DIAGNOSIS — Z1152 Encounter for screening for COVID-19: Secondary | ICD-10-CM | POA: Diagnosis not present

## 2022-03-22 DIAGNOSIS — J90 Pleural effusion, not elsewhere classified: Secondary | ICD-10-CM | POA: Diagnosis not present

## 2022-03-22 DIAGNOSIS — I4891 Unspecified atrial fibrillation: Secondary | ICD-10-CM | POA: Diagnosis not present

## 2022-03-23 ENCOUNTER — Other Ambulatory Visit: Payer: Self-pay | Admitting: Nurse Practitioner

## 2022-03-23 DIAGNOSIS — N529 Male erectile dysfunction, unspecified: Secondary | ICD-10-CM | POA: Diagnosis not present

## 2022-03-23 DIAGNOSIS — D509 Iron deficiency anemia, unspecified: Secondary | ICD-10-CM | POA: Diagnosis not present

## 2022-03-23 DIAGNOSIS — D631 Anemia in chronic kidney disease: Secondary | ICD-10-CM | POA: Diagnosis not present

## 2022-03-23 DIAGNOSIS — J45909 Unspecified asthma, uncomplicated: Secondary | ICD-10-CM | POA: Diagnosis not present

## 2022-03-23 DIAGNOSIS — I5032 Chronic diastolic (congestive) heart failure: Secondary | ICD-10-CM | POA: Diagnosis not present

## 2022-03-23 DIAGNOSIS — R0781 Pleurodynia: Secondary | ICD-10-CM | POA: Diagnosis not present

## 2022-03-23 DIAGNOSIS — I130 Hypertensive heart and chronic kidney disease with heart failure and stage 1 through stage 4 chronic kidney disease, or unspecified chronic kidney disease: Secondary | ICD-10-CM | POA: Diagnosis not present

## 2022-03-23 DIAGNOSIS — N184 Chronic kidney disease, stage 4 (severe): Secondary | ICD-10-CM | POA: Diagnosis not present

## 2022-03-23 DIAGNOSIS — M109 Gout, unspecified: Secondary | ICD-10-CM | POA: Diagnosis not present

## 2022-03-24 ENCOUNTER — Encounter (HOSPITAL_COMMUNITY)
Admission: RE | Admit: 2022-03-24 | Discharge: 2022-03-24 | Disposition: A | Discharge status: Home / Self Care | Payer: Medicare PPO | Source: Ambulatory Visit | Attending: Internal Medicine | Admitting: Internal Medicine

## 2022-03-24 MED ORDER — SODIUM CHLORIDE 0.9 % IV SOLN
510.0000 mg | INTRAVENOUS | Status: DC
  Filled 2022-03-24: qty 17

## 2022-03-24 NOTE — Progress Notes (Signed)
Arrived for 2nd dose of IV Feraheme. Blood pressure low 80s-90s/50s-70s. Patient diagnosed with pneumonia on Tuesday (03/22/22) and placed on oral antibiotics. Patient reported increased weakness and cough. Called and spoke with DJ, RN at Dr. Jacquiline Doe office. Per DJ, do not give IV Feraheme today. Rescheduled for 03/31/22. Patient and pt's wife verbalized understanding.

## 2022-03-25 DIAGNOSIS — J189 Pneumonia, unspecified organism: Secondary | ICD-10-CM | POA: Diagnosis not present

## 2022-03-25 DIAGNOSIS — J441 Chronic obstructive pulmonary disease with (acute) exacerbation: Secondary | ICD-10-CM | POA: Diagnosis not present

## 2022-03-28 ENCOUNTER — Ambulatory Visit
Admission: RE | Admit: 2022-03-28 | Discharge: 2022-03-28 | Disposition: A | Discharge status: Home / Self Care | Payer: Medicare PPO | Source: Ambulatory Visit | Attending: Family Medicine | Admitting: Family Medicine

## 2022-03-28 ENCOUNTER — Other Ambulatory Visit: Payer: Self-pay | Admitting: Family Medicine

## 2022-03-28 ENCOUNTER — Ambulatory Visit: Payer: Medicare PPO | Admitting: Adult Health

## 2022-03-28 DIAGNOSIS — D509 Iron deficiency anemia, unspecified: Secondary | ICD-10-CM | POA: Diagnosis not present

## 2022-03-28 DIAGNOSIS — I5032 Chronic diastolic (congestive) heart failure: Secondary | ICD-10-CM | POA: Diagnosis not present

## 2022-03-28 DIAGNOSIS — R0600 Dyspnea, unspecified: Secondary | ICD-10-CM | POA: Diagnosis not present

## 2022-03-28 DIAGNOSIS — I509 Heart failure, unspecified: Secondary | ICD-10-CM | POA: Diagnosis not present

## 2022-03-28 DIAGNOSIS — R531 Weakness: Secondary | ICD-10-CM | POA: Diagnosis not present

## 2022-03-28 DIAGNOSIS — Z1152 Encounter for screening for COVID-19: Secondary | ICD-10-CM | POA: Diagnosis not present

## 2022-03-28 DIAGNOSIS — J9811 Atelectasis: Secondary | ICD-10-CM | POA: Diagnosis not present

## 2022-03-28 DIAGNOSIS — J189 Pneumonia, unspecified organism: Secondary | ICD-10-CM

## 2022-03-28 DIAGNOSIS — J90 Pleural effusion, not elsewhere classified: Secondary | ICD-10-CM | POA: Diagnosis not present

## 2022-03-28 DIAGNOSIS — I130 Hypertensive heart and chronic kidney disease with heart failure and stage 1 through stage 4 chronic kidney disease, or unspecified chronic kidney disease: Secondary | ICD-10-CM | POA: Diagnosis not present

## 2022-03-28 DIAGNOSIS — J449 Chronic obstructive pulmonary disease, unspecified: Secondary | ICD-10-CM | POA: Diagnosis not present

## 2022-03-28 DIAGNOSIS — I4891 Unspecified atrial fibrillation: Secondary | ICD-10-CM | POA: Diagnosis not present

## 2022-03-28 DIAGNOSIS — R911 Solitary pulmonary nodule: Secondary | ICD-10-CM | POA: Diagnosis not present

## 2022-03-28 DIAGNOSIS — R051 Acute cough: Secondary | ICD-10-CM | POA: Diagnosis not present

## 2022-03-31 ENCOUNTER — Encounter (HOSPITAL_COMMUNITY): Admission: RE | Admit: 2022-03-31 | Payer: Medicare PPO | Source: Ambulatory Visit

## 2022-04-01 ENCOUNTER — Encounter (HOSPITAL_COMMUNITY)
Admission: RE | Admit: 2022-04-01 | Discharge: 2022-04-01 | Disposition: A | Discharge status: Home / Self Care | Payer: Medicare PPO | Source: Ambulatory Visit | Attending: Internal Medicine | Admitting: Internal Medicine

## 2022-04-01 DIAGNOSIS — D649 Anemia, unspecified: Secondary | ICD-10-CM | POA: Diagnosis not present

## 2022-04-01 MED ORDER — SODIUM CHLORIDE 0.9 % IV SOLN
510.0000 mg | Freq: Once | INTRAVENOUS | Status: AC
  Administered 2022-04-01: 510 mg via INTRAVENOUS
  Filled 2022-04-01: qty 17

## 2022-04-04 DIAGNOSIS — I4891 Unspecified atrial fibrillation: Secondary | ICD-10-CM | POA: Diagnosis not present

## 2022-04-04 DIAGNOSIS — J90 Pleural effusion, not elsewhere classified: Secondary | ICD-10-CM | POA: Diagnosis not present

## 2022-04-04 DIAGNOSIS — I5032 Chronic diastolic (congestive) heart failure: Secondary | ICD-10-CM | POA: Diagnosis not present

## 2022-04-04 DIAGNOSIS — D509 Iron deficiency anemia, unspecified: Secondary | ICD-10-CM | POA: Diagnosis not present

## 2022-04-04 DIAGNOSIS — J449 Chronic obstructive pulmonary disease, unspecified: Secondary | ICD-10-CM | POA: Diagnosis not present

## 2022-04-04 DIAGNOSIS — I130 Hypertensive heart and chronic kidney disease with heart failure and stage 1 through stage 4 chronic kidney disease, or unspecified chronic kidney disease: Secondary | ICD-10-CM | POA: Diagnosis not present

## 2022-04-04 DIAGNOSIS — J189 Pneumonia, unspecified organism: Secondary | ICD-10-CM | POA: Diagnosis not present

## 2022-04-04 DIAGNOSIS — I509 Heart failure, unspecified: Secondary | ICD-10-CM | POA: Diagnosis not present

## 2022-04-04 DIAGNOSIS — R911 Solitary pulmonary nodule: Secondary | ICD-10-CM | POA: Diagnosis not present

## 2022-04-05 ENCOUNTER — Emergency Department (HOSPITAL_COMMUNITY): Payer: Medicare PPO

## 2022-04-05 ENCOUNTER — Emergency Department (HOSPITAL_COMMUNITY)
Admission: EM | Admit: 2022-04-05 | Discharge: 2022-04-05 | Disposition: A | Discharge status: Home / Self Care | Payer: Medicare PPO | Attending: Emergency Medicine | Admitting: Emergency Medicine

## 2022-04-05 DIAGNOSIS — W010XXA Fall on same level from slipping, tripping and stumbling without subsequent striking against object, initial encounter: Secondary | ICD-10-CM | POA: Insufficient documentation

## 2022-04-05 DIAGNOSIS — R58 Hemorrhage, not elsewhere classified: Secondary | ICD-10-CM | POA: Diagnosis not present

## 2022-04-05 DIAGNOSIS — I6381 Other cerebral infarction due to occlusion or stenosis of small artery: Secondary | ICD-10-CM | POA: Diagnosis not present

## 2022-04-05 DIAGNOSIS — J9811 Atelectasis: Secondary | ICD-10-CM | POA: Diagnosis not present

## 2022-04-05 DIAGNOSIS — S0101XA Laceration without foreign body of scalp, initial encounter: Secondary | ICD-10-CM | POA: Insufficient documentation

## 2022-04-05 DIAGNOSIS — Z7901 Long term (current) use of anticoagulants: Secondary | ICD-10-CM | POA: Insufficient documentation

## 2022-04-05 DIAGNOSIS — I4891 Unspecified atrial fibrillation: Secondary | ICD-10-CM | POA: Insufficient documentation

## 2022-04-05 DIAGNOSIS — S5001XA Contusion of right elbow, initial encounter: Secondary | ICD-10-CM | POA: Insufficient documentation

## 2022-04-05 DIAGNOSIS — M4312 Spondylolisthesis, cervical region: Secondary | ICD-10-CM | POA: Diagnosis not present

## 2022-04-05 DIAGNOSIS — Z7902 Long term (current) use of antithrombotics/antiplatelets: Secondary | ICD-10-CM | POA: Insufficient documentation

## 2022-04-05 DIAGNOSIS — S59911A Unspecified injury of right forearm, initial encounter: Secondary | ICD-10-CM | POA: Diagnosis not present

## 2022-04-05 DIAGNOSIS — S0990XA Unspecified injury of head, initial encounter: Secondary | ICD-10-CM | POA: Diagnosis not present

## 2022-04-05 DIAGNOSIS — W19XXXA Unspecified fall, initial encounter: Secondary | ICD-10-CM | POA: Diagnosis not present

## 2022-04-05 LAB — COMPREHENSIVE METABOLIC PANEL
ALT: 16 U/L (ref 0–44)
AST: 19 U/L (ref 15–41)
Albumin: 3 g/dL — ABNORMAL LOW (ref 3.5–5.0)
Alkaline Phosphatase: 86 U/L (ref 38–126)
Anion gap: 10 (ref 5–15)
BUN: 18 mg/dL (ref 8–23)
CO2: 26 mmol/L (ref 22–32)
Calcium: 8.4 mg/dL — ABNORMAL LOW (ref 8.9–10.3)
Chloride: 103 mmol/L (ref 98–111)
Creatinine, Ser: 1.67 mg/dL — ABNORMAL HIGH (ref 0.61–1.24)
GFR, Estimated: 39 mL/min — ABNORMAL LOW (ref >60)
Glucose, Bld: 106 mg/dL — ABNORMAL HIGH (ref 70–99)
Potassium: 3.7 mmol/L (ref 3.5–5.1)
Sodium: 139 mmol/L (ref 135–145)
Total Bilirubin: 0.5 mg/dL (ref 0.3–1.2)
Total Protein: 5.9 g/dL — ABNORMAL LOW (ref 6.5–8.1)

## 2022-04-05 LAB — CBC WITH DIFFERENTIAL/PLATELET
Abs Immature Granulocytes: 0.06 10*3/uL (ref 0.00–0.07)
Basophils Absolute: 0 10*3/uL (ref 0.0–0.1)
Basophils Relative: 0 %
Eosinophils Absolute: 0.5 10*3/uL (ref 0.0–0.5)
Eosinophils Relative: 5 %
HCT: 35.1 % — ABNORMAL LOW (ref 39.0–52.0)
Hemoglobin: 10.6 g/dL — ABNORMAL LOW (ref 13.0–17.0)
Immature Granulocytes: 1 %
Lymphocytes Relative: 22 %
Lymphs Abs: 2.2 10*3/uL (ref 0.7–4.0)
MCH: 25.1 pg — ABNORMAL LOW (ref 26.0–34.0)
MCHC: 30.2 g/dL (ref 30.0–36.0)
MCV: 83.2 fL (ref 80.0–100.0)
Monocytes Absolute: 1.4 10*3/uL — ABNORMAL HIGH (ref 0.1–1.0)
Monocytes Relative: 13 %
Neutro Abs: 6.1 10*3/uL (ref 1.7–7.7)
Neutrophils Relative %: 59 %
Platelets: 310 10*3/uL (ref 150–400)
RBC: 4.22 MIL/uL (ref 4.22–5.81)
RDW: 29.4 % — ABNORMAL HIGH (ref 11.5–15.5)
WBC: 10.3 10*3/uL (ref 4.0–10.5)
nRBC: 0 % (ref 0.0–0.2)

## 2022-04-05 MED ORDER — LIDOCAINE-EPINEPHRINE-TETRACAINE (LET) TOPICAL GEL
3.0000 mL | Freq: Once | TOPICAL | Status: AC
  Administered 2022-04-05: 3 mL via TOPICAL
  Filled 2022-04-05: qty 3

## 2022-04-05 NOTE — ED Triage Notes (Signed)
Patient brought in by [facility] from home. Patient got up to use the restroom this morning and tripped and fell hitting his head on a dresser. Patient with small laceration left posterior scalp. Patient denies loss of consciousness. Alert and oriented x4. GCS 15. Vitals stable. Patient denies pain at this time.

## 2022-04-05 NOTE — Discharge Instructions (Addendum)
You had 2 staples placed in your scalp.  These will need to be removed in 5 to 7 days.  Please follow-up with your cardiologist regarding your blood thinner.

## 2022-04-05 NOTE — ED Notes (Signed)
Pt ambulatory  Patient verbalizes understanding of discharge instructions. Opportunity for questioning and answers were provided. Pt discharged from ED to home with family

## 2022-04-05 NOTE — ED Notes (Signed)
Patient ambulated to bathroom with this NT and wife there as needed. Patient tolerated well. RN notified.

## 2022-04-05 NOTE — ED Provider Notes (Signed)
[facility]PART[facility] Provider Note   CSN: [id number] Arrival date & time: 04/05/22  9024     History  No chief complaint on file.   Austin [name] is a 88 y.o. male.  The history is provided by the patient, the EMS personnel, medical records and the spouse.  Austin [name] is a 88 y.o. male who presents to the Emergency Department complaining of fall.  He presents to the emergency department as a level 2 fall on thinners by EMS.  Patient states that he tripped and fell on the carpet at home and struck his head.  He complains of mild soreness to his elbows bilaterally.  Patient denies recent illness but his wife states that he was recently treated with antibiotics for pneumonia 2 weeks ago.  He does take Xarelto for history of atrial fibrillation.  He does have a history of subdural hematoma.  Patient states that he falls frequently and is currently receiving physical therapy for this.  Patient denies any headache, chest pain, nausea, vomiting, fevers.  He reports chronic shortness of breath and this is unchanged from baseline.     Home Medications Prior to Admission medications   Medication Sig Start Date End Date Taking? Authorizing Provider  albuterol (VENTOLIN HFA) 108 (90 Base) MCG/ACT inhaler Inhale 2 puffs into the lungs every 4 (four) hours as needed for wheezing or shortness of breath (cough).    [provider]  amiodarone (PACERONE) 200 MG tablet TAKE 1 TABLET BY MOUTH EVERY DAY Patient taking differently: Take 200 mg by mouth every evening. 06/01/21   Camnitz, Will Hassell Done, MD  amLODipine (NORVASC) 2.5 MG tablet Take 7.5 mg by mouth daily with lunch. 3 tabs daily 03/04/22   [provider]  benzonatate (TESSALON) 100 MG capsule Take 100 mg by mouth 3 (three) times daily as needed for cough.    [provider]  budesonide-formoterol (SYMBICORT) 80-4.5 MCG/ACT inhaler Inhale 2 puffs into the lungs 2 (two) times daily.      [provider]  Butalbital-APAP-Caff-Cod 50-300-40-30 MG CAPS Take 1 capsule by mouth every 4 (four) hours as needed (migraine).    [provider]  carvedilol (COREG) 6.25 MG tablet Take 1 tablet (6.25 mg total) by mouth 2 (two) times daily. 10/14/21   Camnitz, Ocie Doyne, MD  cholecalciferol (VITAMIN D3) 25 MCG (1000 UNIT) tablet Take 1,000 Units by mouth in the morning.    [provider]  EPINEPHrine 0.3 mg/0.3 mL IJ SOAJ injection Inject 0.3 mg into the muscle as needed for anaphylaxis. 12/05/20   Petrucelli, Samantha R, PA-C  furosemide (LASIX) 20 MG tablet Take 1-2 tablets (20-40 mg total) by mouth See admin instructions. Take 1 tablet (20 mg) by mouth scheduled once daily in the morning, may increase and take 2 tablets (40 mg) if needed for weight gain of 3-5 lbs. 03/15/22   Sueanne Margarita, MD  guaifenesin (ROBITUSSIN) 100 MG/5ML syrup Take 400 mg by mouth at bedtime.    [provider]  hydrALAZINE (APRESOLINE) 10 MG tablet Take 10 mg by mouth 3 (three) times daily as needed (bp over 160/90). 03/07/22   [provider]  hydrOXYzine (ATARAX) 25 MG tablet Take 50 mg by mouth daily as needed for itching.    [provider]  iron polysaccharides (NIFEREX) 150 MG capsule Take 150 mg by mouth in the morning.    [provider]  isosorbide mononitrate (IMDUR) 60 MG 24 hr tablet TAKE 1  TABLET BY MOUTH EVERY DAY 12/13/21   Constance Haw, MD  KLOR-CON M20 20 MEQ tablet TAKE 1 TABLET BY MOUTH EVERY DAY 12/20/21   Camnitz, Ocie Doyne, MD  MAGNESIUM CITRATE PO Take 250 mg by mouth at bedtime.    [provider]  Multiple Vitamins-Minerals (PRESERVISION AREDS 2) CHEW Chew 1 tablet by mouth in the morning and at bedtime.    [provider]  olmesartan (BENICAR) 40 MG tablet Take 1 tablet (40 mg total) by mouth every evening. 03/23/22   Sherran Needs, NP  Polyethyl Glycol-Propyl Glycol (SYSTANE) 0.4-0.3 % SOLN Apply  1-2 drops to eye 4 (four) times daily as needed (dry/irritated eyes.).    [provider]  Rivaroxaban (XARELTO) 15 MG TABS tablet Take 1 tablet (15 mg total) by mouth daily with supper. DO NOT RESTART until 11/22/2021 11/22/21   Ezequiel Essex, MD  sertraline (ZOLOFT) 50 MG tablet Take 50 mg by mouth at bedtime. 02/12/20   [provider]  tamsulosin (FLOMAX) 0.4 MG CAPS capsule Take 0.4 mg by mouth daily with lunch.    [provider]  triamcinolone (NASACORT ALLERGY 24HR) 55 MCG/ACT AERO nasal inhaler Place 2 sprays into the nose 2 (two) times daily.    [provider]  valACYclovir (VALTREX) 1000 MG tablet Take 1,000 mg by mouth at bedtime.    [provider]  zolpidem (AMBIEN) 10 MG tablet Take 10 mg by mouth at bedtime.    [provider]      Allergies    Bee venom, Codeine, Iodine, Oxycodone, Penicillins, Benadryl [diphenhydramine], Diltiazem, Diatrizoate, Ivp dye [iodinated contrast media], Tape, and Yohimbine    Review of Systems   Review of Systems  All other systems reviewed and are negative.   Physical Exam Updated Vital Signs BP 137/76   Pulse 74   Temp 98.3 F (36.8 C) (Oral)   Resp (!) 21   Ht '5\' 8"'$  (1.727 m)   Wt 70.3 kg   SpO2 94%   BMI 23.57 kg/m  Physical Exam Vitals and nursing note reviewed.  Constitutional:      Appearance: He is well-developed.  HENT:     Head: Normocephalic.     Comments: There is a 2 cm laceration to the left posterior scalp.  Right eye is absent Cardiovascular:     Rate and Rhythm: Normal rate and regular rhythm.     Heart sounds: No murmur heard. Pulmonary:     Effort: Pulmonary effort is normal. No respiratory distress.     Breath sounds: Normal breath sounds.  Abdominal:     Palpations: Abdomen is soft.     Tenderness: There is no abdominal tenderness. There is no guarding or rebound.  Musculoskeletal:        General: No tenderness.     Comments: There are mild abrasions  to the right elbow with no bony tenderness.  He is able to fully range the right upper extremity without pain.  Skin:    General: Skin is warm and dry.  Neurological:     Mental Status: He is alert and oriented to person, place, and time.     Comments: 5 out of 5 strength in all 4 extremities  Psychiatric:        Behavior: Behavior normal.     ED Results / Procedures / Treatments   Labs (all labs ordered are listed, but only abnormal results are displayed) Labs Reviewed  COMPREHENSIVE METABOLIC PANEL - Abnormal; Notable for  the following components:      Result Value   Glucose, Bld 106 (*)    Creatinine, Ser 1.67 (*)    Calcium 8.4 (*)    Total Protein 5.9 (*)    Albumin 3.0 (*)    GFR, Estimated 39 (*)    All other components within normal limits  CBC WITH DIFFERENTIAL/PLATELET - Abnormal; Notable for the following components:   Hemoglobin 10.6 (*)    HCT 35.1 (*)    MCH 25.1 (*)    RDW 29.4 (*)    Monocytes Absolute 1.4 (*)    All other components within normal limits  URINALYSIS, ROUTINE W REFLEX MICROSCOPIC    EKG None  Radiology CT Head Wo Contrast  Result Date: 04/05/2022 CLINICAL DATA:  Head trauma.  Level 2 fall on blood thinners. EXAM: CT HEAD WITHOUT CONTRAST CT CERVICAL SPINE WITHOUT CONTRAST TECHNIQUE: Multidetector CT imaging of the head and cervical spine was performed following the standard protocol without intravenous contrast. Multiplanar CT image reconstructions of the cervical spine were also generated. RADIATION DOSE REDUCTION: This exam was performed according to the departmental dose-optimization program which includes automated exposure control, adjustment of the mA and/or kV according to patient size and/or use of iterative reconstruction technique. COMPARISON:  11/16/2021 FINDINGS: CT HEAD FINDINGS Brain: No evidence of acute infarction, hemorrhage, hydrocephalus, extra-axial collection or mass lesion/mass effect. Remote lacunar infarcts within the  basal ganglia. There is mild diffuse low-attenuation within the subcortical and periventricular white matter compatible with chronic microvascular disease. Prominence of sulci and ventricles compatible with brain atrophy. Vascular: No hyperdense vessel or unexpected calcification. Skull: Normal. Negative for fracture or focal lesion. Sinuses/Orbits: Air-fluid level identified within the left maxillary sinus. Status post right orbital exoneration with globe prosthesis. Other: Left posterior scalp laceration with hematoma measuring 8 mm in thickness, image 19/3. CT CERVICAL SPINE FINDINGS Alignment: No signs of acute posttraumatic malalignment. There is an anterolisthesis of C2 on C3 measuring 3 mm, image 31/8. Anterolisthesis of C4 on C5 measures 3.5 mm. Slight retrolisthesis of C5 on C6 measures 2 mm. Skull base and vertebrae: No acute fracture. No primary bone lesion or focal pathologic process. Soft tissues and spinal canal: No prevertebral fluid or swelling. No visible canal hematoma. Disc levels: Multilevel disc space narrowing and ventral endplate spurring is noted. This is most severe at C5-6. Upper chest: Negative. Other: None IMPRESSION: 1. No acute intracranial abnormality. 2. Chronic small vessel ischemic disease and brain atrophy. 3. Left posterior scalp laceration with hematoma. 4. No evidence for acute cervical spine fracture or subluxation. 5. Multilevel cervical degenerative disc disease. Electronically Signed   By: Kerby Moors M.D.   On: 04/05/2022 05:19   CT Cervical Spine Wo Contrast  Result Date: 04/05/2022 CLINICAL DATA:  Head trauma.  Level 2 fall on blood thinners. EXAM: CT HEAD WITHOUT CONTRAST CT CERVICAL SPINE WITHOUT CONTRAST TECHNIQUE: Multidetector CT imaging of the head and cervical spine was performed following the standard protocol without intravenous contrast. Multiplanar CT image reconstructions of the cervical spine were also generated. RADIATION DOSE REDUCTION: This exam  was performed according to the departmental dose-optimization program which includes automated exposure control, adjustment of the mA and/or kV according to patient size and/or use of iterative reconstruction technique. COMPARISON:  11/16/2021 FINDINGS: CT HEAD FINDINGS Brain: No evidence of acute infarction, hemorrhage, hydrocephalus, extra-axial collection or mass lesion/mass effect. Remote lacunar infarcts within the basal ganglia. There is mild diffuse low-attenuation within the subcortical and periventricular white matter compatible  with chronic microvascular disease. Prominence of sulci and ventricles compatible with brain atrophy. Vascular: No hyperdense vessel or unexpected calcification. Skull: Normal. Negative for fracture or focal lesion. Sinuses/Orbits: Air-fluid level identified within the left maxillary sinus. Status post right orbital exoneration with globe prosthesis. Other: Left posterior scalp laceration with hematoma measuring 8 mm in thickness, image 19/3. CT CERVICAL SPINE FINDINGS Alignment: No signs of acute posttraumatic malalignment. There is an anterolisthesis of C2 on C3 measuring 3 mm, image 31/8. Anterolisthesis of C4 on C5 measures 3.5 mm. Slight retrolisthesis of C5 on C6 measures 2 mm. Skull base and vertebrae: No acute fracture. No primary bone lesion or focal pathologic process. Soft tissues and spinal canal: No prevertebral fluid or swelling. No visible canal hematoma. Disc levels: Multilevel disc space narrowing and ventral endplate spurring is noted. This is most severe at C5-6. Upper chest: Negative. Other: None IMPRESSION: 1. No acute intracranial abnormality. 2. Chronic small vessel ischemic disease and brain atrophy. 3. Left posterior scalp laceration with hematoma. 4. No evidence for acute cervical spine fracture or subluxation. 5. Multilevel cervical degenerative disc disease. Electronically Signed   By: Kerby Moors M.D.   On: 04/05/2022 05:19   DG Chest Port 1  View  Result Date: 04/05/2022 CLINICAL DATA:  Fall, level 2 trauma. EXAM: PORTABLE CHEST 1 VIEW COMPARISON:  03/14/2019, 03/28/2022. FINDINGS: The heart size and mediastinal contours are stable. There is atherosclerotic calcification of the aorta. Mild atelectasis is noted at the left lung base. No effusion or pneumothorax. Rib fractures are noted bilaterally which were likely present on the recent CT. IMPRESSION: Mild atelectasis at the left lung base. Electronically Signed   By: Brett Fairy M.D.   On: 04/05/2022 04:21    Procedures .Marland KitchenLaceration Repair  Date/Time: 04/05/2022 6:39 AM  Performed by: Quintella Reichert, MD Authorized by: Quintella Reichert, MD   Consent:    Consent obtained:  Verbal   Consent given by:  Patient   Risks discussed:  Infection, poor cosmetic result and pain Anesthesia:    Anesthesia method:  Topical application   Topical anesthetic:  LET Laceration details:    Location:  Scalp   Scalp location:  Occipital   Length (cm):  2 Treatment:    Area cleansed with:  Chlorhexidine   Amount of cleaning:  Standard   Debridement:  None Skin repair:    Repair method:  Staples   Number of staples:  2 Approximation:    Approximation:  Close     Medications Ordered in ED Medications  lidocaine-EPINEPHrine-tetracaine (LET) topical gel (3 mLs Topical Given 04/05/22 0508)    ED Course/ Medical Decision Making/ A&P                           Medical Decision Making Amount and/or Complexity of Data Reviewed Labs: ordered. Radiology: ordered.   Patient here as a level 2 trauma alert following a fall on thinners.  He does have a scalp laceration, this was repaired per procedure note.  Labs with renal insufficiency similar to his baseline.  CBC with stable anemia.  Patient without recent urinary symptoms, do not feel he needs a UA at this time.  Patient able to ambulate in the department without difficulty and at his baseline.  Discussed with patient and wife findings  of studies.  Feel he is stable for discharge home at this time.  Recommend that he discuss with his cardiologist whether or not he should continue  his anticoagulation.  Return precautions discussed.        Final Clinical Impression(s) / ED Diagnoses Final diagnoses:  Fall, initial encounter  Laceration of scalp, initial encounter  Contusion of right elbow, initial encounter    Rx / DC Orders ED Discharge Orders     None         Quintella Reichert, MD 04/05/22 [phone]

## 2022-04-11 DIAGNOSIS — C6901 Malignant neoplasm of right conjunctiva: Secondary | ICD-10-CM | POA: Diagnosis not present

## 2022-04-11 DIAGNOSIS — I10 Essential (primary) hypertension: Secondary | ICD-10-CM | POA: Diagnosis not present

## 2022-04-11 DIAGNOSIS — J449 Chronic obstructive pulmonary disease, unspecified: Secondary | ICD-10-CM | POA: Diagnosis not present

## 2022-04-11 DIAGNOSIS — Z9001 Acquired absence of eye: Secondary | ICD-10-CM | POA: Diagnosis not present

## 2022-04-11 DIAGNOSIS — Z961 Presence of intraocular lens: Secondary | ICD-10-CM | POA: Diagnosis not present

## 2022-04-11 DIAGNOSIS — L03115 Cellulitis of right lower limb: Secondary | ICD-10-CM | POA: Diagnosis not present

## 2022-04-11 DIAGNOSIS — Z8619 Personal history of other infectious and parasitic diseases: Secondary | ICD-10-CM | POA: Diagnosis not present

## 2022-04-11 DIAGNOSIS — H353122 Nonexudative age-related macular degeneration, left eye, intermediate dry stage: Secondary | ICD-10-CM | POA: Diagnosis not present

## 2022-04-13 DIAGNOSIS — J449 Chronic obstructive pulmonary disease, unspecified: Secondary | ICD-10-CM | POA: Diagnosis not present

## 2022-04-13 DIAGNOSIS — I5032 Chronic diastolic (congestive) heart failure: Secondary | ICD-10-CM | POA: Diagnosis not present

## 2022-04-13 DIAGNOSIS — I130 Hypertensive heart and chronic kidney disease with heart failure and stage 1 through stage 4 chronic kidney disease, or unspecified chronic kidney disease: Secondary | ICD-10-CM | POA: Diagnosis not present

## 2022-04-13 DIAGNOSIS — I872 Venous insufficiency (chronic) (peripheral): Secondary | ICD-10-CM | POA: Diagnosis not present

## 2022-04-13 DIAGNOSIS — L03115 Cellulitis of right lower limb: Secondary | ICD-10-CM | POA: Diagnosis not present

## 2022-04-19 ENCOUNTER — Ambulatory Visit (HOSPITAL_COMMUNITY)
Admission: RE | Admit: 2022-04-19 | Discharge: 2022-04-19 | Disposition: A | Discharge status: Home / Self Care | Payer: Medicare PPO | Source: Ambulatory Visit | Attending: Nurse Practitioner | Admitting: Nurse Practitioner

## 2022-04-19 VITALS — BP 104/64 | HR 70 | Ht 68.0 in | Wt 151.0 lb

## 2022-04-19 DIAGNOSIS — D6869 Other thrombophilia: Secondary | ICD-10-CM

## 2022-04-19 DIAGNOSIS — I10 Essential (primary) hypertension: Secondary | ICD-10-CM | POA: Insufficient documentation

## 2022-04-19 DIAGNOSIS — I4819 Other persistent atrial fibrillation: Secondary | ICD-10-CM | POA: Diagnosis not present

## 2022-04-19 DIAGNOSIS — Z7901 Long term (current) use of anticoagulants: Secondary | ICD-10-CM | POA: Insufficient documentation

## 2022-04-19 NOTE — Addendum Note (Signed)
Encounter addended by: Sherran Needs, NP on: 04/19/2022 2:30 PM  Actions taken: Clinical Note Signed

## 2022-04-19 NOTE — Progress Notes (Addendum)
Patient ID: Austin [name], male   DOB: [birth year], 88 y.o.   MRN: [id number]     Primary Care Physician: Austin Bunting, MD Referring Physician: [facility] [name] f/u EP: Austin [name] Austin [name] is a 88 y.o. male with a h/o persistent afib that failed flecainide, amiodarone and was admitted and loaded on tikosyn 3/27 thru 3/30. Renal function and electrolytes were followed during the hospitalization. QTc had prolongation and required down-titration of  Tiksoyn with improvement and stable QTc, his EKG reviewed by Austin [name].  He converted to sinus rhythm 08/12/15.  He was monitored until discharge on telemetry which demonstrated SR.  F/u in afib clinic today, 09/24/19.He was recently seen by Austin Kilts, PA at the [facility] office and he was in rhythm. QTc was 495 ms. There was a question if Tikosyn needed to be stopped for  qt concerns and changed  to amiodarone. He is on lowest dose of tikosyn at 125 mcg bid.  In the office here today, his qtc is 455 ms and he is in rhythm. He does not want change in therapy. He did try amiodarone briefly at one time, since he only has one [name], he was concerned that amiodarone would negatively effect his vision.  He continues on xarelto 15 mg daily for a CHA2DS2VASc of 3.   F/u in afib clinic, 8/25,he continues on Tikosyn and is maintaining SR. He is pending having his rt [name] removed for ongoing pain/ha's with h/o of melanoma/shingles and  infection in the [name]. He was advised to hold xarelto x 3 days. Qtc is stable.   F/u in afib clinic, 04/13/20. He is here for surveillance of Tikosyn use. He is not having any afib with Tikosyn use. He is pending more [name] surgery Wednesday for melanoma found inside the socket when  [name] was removed late summer. He is off DOAC today and will restart Wednesday pm. Qtc is stable.   F/u in afib clinic, 08/19/20. He is reporting fatigue and shortness of breath. Saw his PCP this last week was very weak and had a systolic BP in the 85'Y.  PCP felt he may have a UTI and started on antibiotics and also felt that he may be mildly dehydrated. He is staying in [place]. His HR though is 51 bpm and maybe bradycardia from BB may be contributing to symptoms. If this  does not help will need to discuss obtaining  a stress test. Last stress test was in 2017 did not show any ischemia. He reports that at the beach recently he did have shortness of breath carrying items up the stairs that was much worse that in previous years.   F/u in afib clinic, 12/30/20.pt is here for his usual tikosyn surveillance visit but is found to be out of rhythm  Ekg shows that he is out of rhythm in afib at 66 bpm. He has not felt up to par recently since stepping on a hornet's nest 7/23 with known allergy to bee stings. The wife gave him an epi pen and he proceeded to the ER. Ekg then showed afib in the 60's. He was treated with prednisone and d/c home. He then went back to the ER 8/2 with abdominal pain. The cause of this ws thought to be constipation from prednisone. EKG at that visit afib in the 60's. He then went to the beach and got very week. He was treated at [place] [name] with dehydration. He was not admitted and I do not see  where an EKG was performed. Cardioversion was discussed and he would like to proceed as he is still feeling weaker than usual. No missed anticoagulation. He remains on Tikosyn 125 mcg bid.  F/u in afib clinic, 01/12/22. He had a successful cardioversion 01/04/21. Today in the clinic, Unfortunately, he has returned to rate controlled atrial flutter. He did have more energy after the cardioversion x 2 days. He appears to be failing cardioversion, as he was out of rhythm for a few months before I saw him. He only has one [name] and is concerned regarding  amiodarone affecting his sight. For now, I will leave everything in place but would like him to discuss further with Austin [name]. His wife thinks using the wheelbarrow at the beach caused him to get out of  rhythm.   F/u in afib clinic, 03/09/21. He saw Austin [name] after my last visit. Tikosyn was stopped and he was placed on Multaq. He had a successful cardioversion but had ERAF. He is here to discuss further options. He felt terrible on Multaq with increased shortness of breath, and since being off that drug feels much improved. He  is nicely rate controlled and will continue to go forward with rate controlled afib as his management option. He has failed multaq, flecainide, tikoyn and stopped amiodarone as he was afraid of retinal deposits affecting his vision as he only has one [name]. His age limits ablation as an option.   F/u in afib clinic, 06/15/21. Since I saw pt last,  he had a conversation with his ophthalmologist, re starting amiodarone, as he wished to be in [place]. He was given  the ok to start so he was loaded on amiodarone and had a successful cardioversion. His ekg today shows rate controlled afib. He thought he was in [place]. He notices that he is only symptomatic with fast walking and stairs but his wife feels  that he has been more fatigued. He did not note a difference in how he felt right after cardioversion 04/14/22.    F/u afib clinic, s/p cardioversion, 07/14/21. He had a successful cardioversion and remains in Sinus brady. He feels he has more energy but still overall has shortness of breath, but hap[py he is back in SR.   F/u in afib clinic, 04/19/22. Pt was set up for cardioversion 10/31 by Dr. Martinique but today found in  afib rate controlled. Pt thought he was in rhythm until told today. His wife that his balance is progressively getting worse and is having multiple falls.  He fell in July with a small SDH. Since then he has fallen with abrasions to legs and has had to deal with cellulitis, currently on clindamycin. He does use a walker at home but sometimes loses balance with no specific cause.   Today, he denies symptoms of palpitations, chest pain, shortness of breath, orthopnea, PND, lower  extremity edema, dizziness, presyncope, syncope, or neurologic sequela. The patient is tolerating medications without difficulties and is otherwise without complaint today.   Past Medical History:  Diagnosis Date   Allergy    Asthma    BPH (benign prostatic hypertrophy)    CKD (chronic kidney disease) stage 3, GFR 30-59 ml/min (HCC)    Cough    Gout    Hypertensive chronic kidney disease    Melanoma in situ of right eyelid ([facility])    OSA (obstructive sleep apnea)    Persistent atrial fibrillation [facility])    Past Surgical History:  Procedure Laterality Date  CARDIOVERSION [name] 06/05/2015   Procedure: CARDIOVERSION;  Surgeon: Sanda Klein, MD;  Location: [place];  Service: Cardiovascular;  Laterality: [name];   CARDIOVERSION [name] 01/04/2021   Procedure: CARDIOVERSION;  Surgeon: [name], MD;  Location: [place];  Service: Cardiovascular;  Laterality: [name];   CARDIOVERSION [name] 02/08/2021   Procedure: CARDIOVERSION;  Surgeon: Jerline Pain, MD;  Location: [facility] ENDOSCOPY;  Service: Cardiovascular;  Laterality: [name];   CARDIOVERSION [name] 04/14/2021   Procedure: CARDIOVERSION;  Surgeon: Freada Bergeron, MD;  Location: [place];  Service: Cardiovascular;  Laterality: [name];   CARDIOVERSION [name] 07/05/2021   Procedure: CARDIOVERSION;  Surgeon: Fay Records, MD;  Location: [place];  Service: Cardiovascular;  Laterality: [name];   CARDIOVERSION [name] 03/15/2022   Procedure: CARDIOVERSION;  Surgeon: Sueanne Margarita, MD;  Location: [facility] ENDOSCOPY;  Service: Cardiovascular;  Laterality: [name];   [name] SURGERY  12/2017    Current Outpatient Medications  Medication Sig Dispense Refill   albuterol (VENTOLIN HFA) 108 (90 Base) MCG/ACT inhaler Inhale 2 puffs into the lungs every 4 (four) hours as needed for wheezing or shortness of breath (cough).     amiodarone (PACERONE) 200 MG tablet TAKE 1 TABLET BY MOUTH EVERY DAY (Patient taking differently: Take 200 mg by mouth every evening.) 90 tablet  3   amLODipine (NORVASC) 2.5 MG tablet Take 7.5 mg by mouth daily with lunch. 3 tabs daily     benzonatate (TESSALON) 100 MG capsule Take 100 mg by mouth 3 (three) times daily as needed for cough.     budesonide-formoterol (SYMBICORT) 80-4.5 MCG/ACT inhaler Inhale 2 puffs into the lungs 2 (two) times daily.      Butalbital-APAP-Caff-Cod 50-300-40-30 MG CAPS Take 1 capsule by mouth every 4 (four) hours as needed (migraine).     carvedilol (COREG) 6.25 MG tablet Take 1 tablet (6.25 mg total) by mouth 2 (two) times daily. 180 tablet 1   cholecalciferol (VITAMIN D3) 25 MCG (1000 UNIT) tablet Take 1,000 Units by mouth in the morning.     clindamycin (CLEOCIN) 300 MG capsule Take 300 mg by mouth 3 (three) times daily.     EPINEPHrine 0.3 mg/0.3 mL IJ SOAJ injection Inject 0.3 mg into the muscle as needed for anaphylaxis. 1 each 0   furosemide (LASIX) 20 MG tablet Take 1-2 tablets (20-40 mg total) by mouth See admin instructions. Take 1 tablet (20 mg) by mouth scheduled once daily in the morning, may increase and take 2 tablets (40 mg) if needed for weight gain of 3-5 lbs.     guaifenesin (ROBITUSSIN) 100 MG/5ML syrup Take 400 mg by mouth at bedtime.     hydrALAZINE (APRESOLINE) 10 MG tablet Take 10 mg by mouth 3 (three) times daily as needed (bp over 160/90).     HYDROcodone-acetaminophen (NORCO/VICODIN) 5-325 MG tablet SMARTSIG:1 Tablet(s) By Mouth Every 12 Hours PRN     hydrOXYzine (ATARAX) 25 MG tablet Take 50 mg by mouth daily as needed for itching.     iron polysaccharides (NIFEREX) 150 MG capsule Take 150 mg by mouth in the morning.     isosorbide mononitrate (IMDUR) 60 MG 24 hr tablet TAKE 1 TABLET BY MOUTH EVERY DAY 90 tablet 2   KLOR-CON M20 20 MEQ tablet TAKE 1 TABLET BY MOUTH EVERY DAY 90 tablet 1   MAGNESIUM CITRATE PO Take 250 mg by mouth at bedtime.     Multiple Vitamins-Minerals (PRESERVISION AREDS 2) CHEW Chew 1 tablet by mouth in the morning and at bedtime.  olmesartan (BENICAR) 40  MG tablet Take 1 tablet (40 mg total) by mouth every evening. 90 tablet 2   Polyethyl Glycol-Propyl Glycol (SYSTANE) 0.4-0.3 % SOLN Apply 1-2 drops to [name] 4 (four) times daily as needed (dry/irritated eyes.).     Rivaroxaban (XARELTO) 15 MG TABS tablet Take 1 tablet (15 mg total) by mouth daily with supper. DO NOT RESTART until 11/22/2021 90 tablet 1   sertraline (ZOLOFT) 50 MG tablet Take 50 mg by mouth at bedtime.     tamsulosin (FLOMAX) 0.4 MG CAPS capsule Take 0.4 mg by mouth daily with lunch.     triamcinolone (NASACORT ALLERGY 24HR) 55 MCG/ACT AERO nasal inhaler Place 2 sprays into the nose 2 (two) times daily.     valACYclovir (VALTREX) 1000 MG tablet Take 1,000 mg by mouth at bedtime.     zolpidem (AMBIEN) 10 MG tablet Take 10 mg by mouth at bedtime.     No current facility-administered medications for this encounter.    Allergies  Allergen Reactions   Bee Venom Anaphylaxis   Codeine Nausea And Vomiting and Itching   Iodine     Other reaction(s): Swollen Tongue   Oxycodone Nausea And Vomiting   Penicillins Swelling and Rash    Has patient had a PCN reaction causing immediate rash, facial/tongue/throat swelling, SOB or lightheadedness with hypotension: Yes Has patient had a PCN reaction causing severe rash involving mucus membranes or skin necrosis: No Has patient had a PCN reaction that required hospitalization No Has patient had a PCN reaction occurring within the last 10 years: No If all of the above answers are "NO", then may proceed with Cephalosporin use.   Benadryl [Diphenhydramine]     Per cards, had episode of flipping into A. Fib after benadryl   Diltiazem Other (See Comments)    Severe headaches 120 mg BID   Diatrizoate Rash   Ivp Dye [Iodinated Contrast Media] Rash   Tape Itching and Rash    Redness.  Please use "paper" tape only   Yohimbine     Unknown    Social History   Socioeconomic History   Marital status: Married    Spouse name: Austin [name]   Number of  children: Not on file   Years of education: Not on file   Highest education level: Not on file  Occupational History   Not on file  Tobacco Use   Smoking status: Never   Smokeless tobacco: Never  Vaping Use   Vaping Use: Never used  Substance and Sexual Activity   Alcohol use: Yes    Alcohol/week: 1.0 standard drink of alcohol    Types: 1 Standard drinks or equivalent per week    Comment: occ   Drug use: No   Sexual activity: Not on file  Other Topics Concern   Not on file  Social History Narrative   Right Handed.  Caffiene  Coffee, tea 3 cups daily.   Pt retired.     Lives at home with wife.     Social Determinants of Health   Financial Resource Strain: Not on file  Food Insecurity: Not on file  Transportation Needs: Not on file  Physical Activity: Not on file  Stress: Not on file  Social Connections: Not on file  Intimate Partner Violence: Not on file    Family History  Problem Relation Age of Onset   Hypertension Mother    Hypertension Father    Lung cancer Father    Sleep apnea Neg Hx  ROS- All systems are reviewed and negative except as per the HPI above  Physical Exam: Vitals:   04/19/22 1114  BP: 104/64  Pulse: 70  Weight: 68.5 kg  Height: '5\' 8"'$  (1.727 m)     GEN- The patient is well appearing, alert and oriented x 3 today.   Head- normocephalic, atraumatic Eyes-  Sclera clear, conjunctiva pink. Blind rt [name] Ears- hearing intact Oropharynx- clear Neck- supple, no JVP Lymph- no cervical lymphadenopathy Lungs- Clear to ausculation bilaterally, normal work of breathing Heart- irregular rate and rhythm, no murmurs, rubs or gallops, PMI not laterally displaced GI- soft, NT, ND, + BS Extremities- no clubbing, cyanosis, or multiple abrasions to LE's  MS- no significant deformity or atrophy Skin- no rash or lesion Psych- euthymic mood, full affect Neuro- strength and sensation are intact  EKG-  Vent. rate 70 BPM PR interval * ms QRS duration  108 ms QT/QTcB 442/477 ms P-R-T axes * -84 32 Atrial flutter with variable A-V block Left axis deviation Incomplete left bundle branch block Abnormal ECG When compared with ECG of 05-Apr-2022 04:11, PREVIOUS ECG IS PRESENT  2021-Echo- . Left ventricular ejection fraction, by estimation, is 60 to 65%. The left ventricle has normal function. The left ventricle has no regional wall motion abnormalities. There is severe left ventricular hypertrophy. Left ventricular diastolic parameters are consistent with Grade II diastolic dysfunction (pseudonormalization). Elevated left atrial pressure. 2. Right ventricular systolic function is mildly reduced. The right ventricular size is normal. There is normal pulmonary artery systolic pressure. 3. The mitral valve is normal in structure. Trivial mitral valve regurgitation. 4. The aortic valve was not well visualized. Aortic valve regurgitation is mild. Mild aortic valve stenosis. Vmax 2.3 m/s, Mg 9 mmHg, AVA 1.3 cm^2, DI 0.43 5. Aortic dilatation noted. There is mild dilatation of the aortic root measuring 40 mm. Mild dilatation of the ascending aorta measuring 15m 6. Left atrial size was mildly dilated. 7. Severe asymmetric hypertrophy of basal septum. Also with apical hypertrophy with cavity obliteration during systole. Consider cardiac MRI to evaluate for hypertrophic cardiomyopathy or amyloidosis.  2017-Nuclear stress EF: 55%. There was no ST segment deviation noted during stress. Patient was in atrial fibrillation throughout the study. There is a small defect of mild severity present in the basal inferolateral and mid inferolateral location. The defect is non-reversible. There is a small defect of mild severity present in the basal inferior and mid inferior location. The defect is non-reversible.There is a small defect of mild severity present in the apex location. The defect is non-reversible. No ischemia noted. This is a low risk study. The  left ventricular ejection fraction is normal (55-65%).    Assessment and Plan: 1. Persistent afib Has been maintaining SR on Tikosyn  until July 2022 when received a bee sting, EPI pen and went into afib.   He had successful cardioversion 8/22 but possibly only lasted 2 days.  With ERAF, he saw Dr. CCurt Bearsand TPhyllis Gingerwas stopped, Multaq was started and he had another successful cardioversion but EFAF He stopped Multaq ( made him feel terrible and have shortness of breath) He had a successful cardioversion after loading on amiodarone for 2 weeks in mid November, 2022, but went  gone back into afib, date unknown.  He wanted like to try one more cardioversion before he gave  up hope with amiodarone to maintain SR, 06/17/21 He had a successful cardioversion and was still in SR when he saw Dr. CCurt Bearsin June He saw  Dr. Martinique in July and was in afib with successful cardioversion 10/31 Unfortunately today was in afib, rate controlled and pt was surprised to find out he was not in [place].  Discussion if worthwhile to continue amiodarone if not keeping him in rhythm and may be contributing to imbalance and falls Continue amiodarone 200 mg daily for now and can discuss with EP appointment with Jonni Sanger 04/22/22 Continue carvedilol 6.25 mg bid   2. CHA2DS2VASc score  of 3 Continue xarelto 15 mg qd Wife questioning if safe to continue with multiple falls and SDH last summer  According to a previous discussion with  Dr. Caryl Comes re a paper showing  that a pt would need to fall greater than 200 times a year before the risk of anticoagulation would outweigh the benefit   It can be discussed with Jonni Sanger as well on the 8th  3. HTN Stable      F/u with Austin Kilts, PA, 04/22/22   Geroge Baseman. Arnie Maiolo, [place] [name] [address] [place], [place] [address] [phone]

## 2022-04-20 DIAGNOSIS — I5032 Chronic diastolic (congestive) heart failure: Secondary | ICD-10-CM | POA: Diagnosis not present

## 2022-04-20 DIAGNOSIS — N1831 Chronic kidney disease, stage 3a: Secondary | ICD-10-CM | POA: Diagnosis not present

## 2022-04-20 DIAGNOSIS — I130 Hypertensive heart and chronic kidney disease with heart failure and stage 1 through stage 4 chronic kidney disease, or unspecified chronic kidney disease: Secondary | ICD-10-CM | POA: Diagnosis not present

## 2022-04-20 DIAGNOSIS — L03115 Cellulitis of right lower limb: Secondary | ICD-10-CM | POA: Diagnosis not present

## 2022-04-20 DIAGNOSIS — J449 Chronic obstructive pulmonary disease, unspecified: Secondary | ICD-10-CM | POA: Diagnosis not present

## 2022-04-21 DIAGNOSIS — R3915 Urgency of urination: Secondary | ICD-10-CM | POA: Diagnosis not present

## 2022-04-21 DIAGNOSIS — R351 Nocturia: Secondary | ICD-10-CM | POA: Diagnosis not present

## 2022-04-21 DIAGNOSIS — N401 Enlarged prostate with lower urinary tract symptoms: Secondary | ICD-10-CM | POA: Diagnosis not present

## 2022-04-21 NOTE — Progress Notes (Signed)
PCP:  Burnard Bunting, MD Primary Cardiologist: Will Meredith Leeds, MD Electrophysiologist: Will Meredith Leeds, MD   Austin [name] is a 88 y.o. male seen today for Will Meredith Leeds, MD for routine electrophysiology followup. He has had frequent falls in the last couple months. He saw Butch Penny earlier this week and was noted to be back in AF after DCCV 10/31. He was not aware he had returned to AF until the appointment. He has been taking amiodarone '200mg'$  daily.  Previously failed tikosyn.   Increased falls/unsteadiness is main complaint today.  He uses a wood cane when out of house, uses a rolling walker at home. Has been having home health PT and OT.   Patient's wife is also concerned about his BP, states that at one time it was very high 180/120s. PCP has started on PRN hydral and this seems to have improved lately. She has recording of home readings, typically running 100-120s/60s.  He denies chest pain, palpitations, dyspnea, PND, orthopnea, nausea, vomiting, weight gain, or early satiety.   Past Medical History:  Diagnosis Date   Allergy    Asthma    BPH (benign prostatic hypertrophy)    CKD (chronic kidney disease) stage 3, GFR 30-59 ml/min (HCC)    Cough    Gout    Hypertensive chronic kidney disease    Melanoma in situ of right eyelid ([place])    OSA (obstructive sleep apnea)    Persistent atrial fibrillation ([place])    Past Surgical History:  Procedure Laterality Date   CARDIOVERSION [name] 06/05/2015   Procedure: CARDIOVERSION;  Surgeon: Sanda Klein, MD;  Location: [facility];  Service: Cardiovascular;  Laterality: [name];   CARDIOVERSION [name] 01/04/2021   Procedure: CARDIOVERSION;  Surgeon: [name], MD;  Location: [facility];  Service: Cardiovascular;  Laterality: [name];   CARDIOVERSION [name] 02/08/2021   Procedure: CARDIOVERSION;  Surgeon: Jerline Pain, MD;  Location: [facility];  Service: Cardiovascular;  Laterality: [name];   CARDIOVERSION [name] 04/14/2021    Procedure: CARDIOVERSION;  Surgeon: Freada Bergeron, MD;  Location: [facility];  Service: Cardiovascular;  Laterality: [name];   CARDIOVERSION [name] 07/05/2021   Procedure: CARDIOVERSION;  Surgeon: Fay Records, MD;  Location: [facility];  Service: Cardiovascular;  Laterality: [name];   CARDIOVERSION [name] 03/15/2022   Procedure: CARDIOVERSION;  Surgeon: Sueanne Margarita, MD;  Location: [facility];  Service: Cardiovascular;  Laterality: [name];   EYE SURGERY  12/2017    Current Outpatient Medications  Medication Sig Dispense Refill   albuterol (VENTOLIN HFA) 108 (90 Base) MCG/ACT inhaler Inhale 2 puffs into the lungs every 4 (four) hours as needed for wheezing or shortness of breath (cough).     amLODipine (NORVASC) 2.5 MG tablet Take 7.5 mg by mouth daily with lunch. 3 tabs daily     benzonatate (TESSALON) 100 MG capsule Take 100 mg by mouth 3 (three) times daily as needed for cough.     budesonide-formoterol (SYMBICORT) 80-4.5 MCG/ACT inhaler Inhale 2 puffs into the lungs 2 (two) times daily.      Butalbital-APAP-Caff-Cod 50-300-40-30 MG CAPS Take 1 capsule by mouth every 4 (four) hours as needed (migraine).     cholecalciferol (VITAMIN D3) 25 MCG (1000 UNIT) tablet Take 1,000 Units by mouth in the morning.     EPINEPHrine 0.3 mg/0.3 mL IJ SOAJ injection Inject 0.3 mg into the muscle as needed for anaphylaxis. 1 each 0   furosemide (LASIX) 20 MG tablet Take 20 mg by mouth as directed. M-W-F '40mg'$   tabs, other days '20mg'$  tabs     guaifenesin (ROBITUSSIN) 100 MG/5ML syrup Take 400 mg by mouth at bedtime.     hydrALAZINE (APRESOLINE) 10 MG tablet Take 10 mg by mouth 3 (three) times daily as needed (bp over 160/90).     HYDROcodone-acetaminophen (NORCO/VICODIN) 5-325 MG tablet SMARTSIG:1 Tablet(s) By Mouth Every 12 Hours PRN     hydrOXYzine (ATARAX) 25 MG tablet Take 50 mg by mouth daily as needed for itching.     iron polysaccharides (NIFEREX) 150 MG capsule Take 150 mg by mouth in the morning.      isosorbide mononitrate (IMDUR) 60 MG 24 hr tablet TAKE 1 TABLET BY MOUTH EVERY DAY 90 tablet 2   KLOR-CON M20 20 MEQ tablet TAKE 1 TABLET BY MOUTH EVERY DAY 90 tablet 1   MAGNESIUM CITRATE PO Take 250 mg by mouth at bedtime.     Multiple Vitamins-Minerals (PRESERVISION AREDS 2) CHEW Chew 1 tablet by mouth in the morning and at bedtime.     olmesartan (BENICAR) 40 MG tablet Take 1 tablet (40 mg total) by mouth every evening. 90 tablet 2   Polyethyl Glycol-Propyl Glycol (SYSTANE) 0.4-0.3 % SOLN Apply 1-2 drops to eye 4 (four) times daily as needed (dry/irritated eyes.).     Rivaroxaban (XARELTO) 15 MG TABS tablet Take 1 tablet (15 mg total) by mouth daily with supper. DO NOT RESTART until 11/22/2021 90 tablet 1   sertraline (ZOLOFT) 50 MG tablet Take 50 mg by mouth at bedtime.     tamsulosin (FLOMAX) 0.4 MG CAPS capsule Take 0.4 mg by mouth daily with lunch.     triamcinolone (NASACORT ALLERGY 24HR) 55 MCG/ACT AERO nasal inhaler Place 2 sprays into the nose 2 (two) times daily.     valACYclovir (VALTREX) 1000 MG tablet Take 1,000 mg by mouth at bedtime.     zolpidem (AMBIEN) 10 MG tablet Take 10 mg by mouth at bedtime.     carvedilol (COREG) 6.25 MG tablet Take 1 tablet (6.25 mg total) by mouth 2 (two) times daily. 180 tablet 1   No current facility-administered medications for this visit.    Allergies  Allergen Reactions   Bee Venom Anaphylaxis   Codeine Nausea And Vomiting and Itching   Iodine     Other reaction(s): Swollen Tongue   Oxycodone Nausea And Vomiting   Penicillins Swelling and Rash    Has patient had a PCN reaction causing immediate rash, facial/tongue/throat swelling, SOB or lightheadedness with hypotension: Yes Has patient had a PCN reaction causing severe rash involving mucus membranes or skin necrosis: No Has patient had a PCN reaction that required hospitalization No Has patient had a PCN reaction occurring within the last 10 years: No If all of the above answers are  "NO", then may proceed with Cephalosporin use.   Benadryl [Diphenhydramine]     Per cards, had episode of flipping into A. Fib after benadryl   Diltiazem Other (See Comments)    Severe headaches 120 mg BID   Diatrizoate Rash   Ivp Dye [Iodinated Contrast Media] Rash   Tape Itching and Rash    Redness.  Please use "paper" tape only   Yohimbine     Unknown    Social History   Socioeconomic History   Marital status: Married    Spouse name: [name]   Number of children: Not on file   Years of education: Not on file   Highest education level: Not on file  Occupational History   Not  on file  Tobacco Use   Smoking status: Never   Smokeless tobacco: Never  Vaping Use   Vaping Use: Never used  Substance and Sexual Activity   Alcohol use: Yes    Alcohol/week: 1.0 standard drink of alcohol    Types: 1 Standard drinks or equivalent per week    Comment: occ   Drug use: No   Sexual activity: Not on file  Other Topics Concern   Not on file  Social History Narrative   Right Handed.  Caffiene  Coffee, tea 3 cups daily.   Pt retired.     Lives at home with wife.     Social Determinants of Health   Financial Resource Strain: Not on file  Food Insecurity: Not on file  Transportation Needs: Not on file  Physical Activity: Not on file  Stress: Not on file  Social Connections: Not on file  Intimate Partner Violence: Not on file     Review of Systems: All other systems reviewed and are otherwise negative except as noted above.  Physical Exam: Vitals:   04/22/22 1109  BP: 120/70  Pulse: 70  SpO2: 92%  Weight: 153 lb (69.4 kg)  Height: '5\' 8"'$  (1.727 m)    GEN- The patient is chronically ill- appearing, alert and oriented x 3 today.   HEENT: normocephalic, atraumatic; sclera clear, conjunctiva pink; hearing intact; oropharynx clear; neck supple, no JVP Lymph- no cervical lymphadenopathy Lungs- Clear to ausculation bilaterally, normal work of breathing.  No wheezes, rales,  rhonchi Heart- Irregularly irregular rate and rhythm, no murmurs, rubs or gallops, PMI not laterally displaced GI- soft, non-tender, non-distended, bowel sounds present, no hepatosplenomegaly Extremities- Trace peripheral edema. no clubbing or cyanosis; DP/PT/radial pulses 2+ bilaterally MS- no significant deformity or atrophy Skin- warm and dry, no rash or lesion, healed wound to R shin Psych- euthymic mood, full affect Neuro- strength and sensation are intact  EKG is not ordered. Personal review of EKG from  04/19/2022  shows coarse AF vs AFL with controlled rate  Additional studies reviewed include: Previous EP notes.    Assessment and Plan:  1. Permanent AF S/p DCCV many times in past, has failed tikosyn. Most recently, he saw Dr. [place]ique in July 2023 and was in afib -> DCCV 03/15/22 back to SR. Unfortunately, returned to AF at some point prior to seeing Butch Penny earlier this week. He is not aware of rhythm and does not feel any different when in sinus.  Shared decision making with patient and wife regarding whether to continue amiodarone. Given lack of symptoms while in AF with inability to maintain SR and possible contribution to falls, will stop amiodarone and focus on rate control instead of rhythm control. He has already made his pill packs for the next week, so will stop amio after that time.   Rate controlled today in clinic, cont coreg at 6.25 BID   2. Hypercoagulable state d/t AF  H/o Subdural hem  Frequent Falls  AC - Xarelto '15mg'$ , appropriately dosed; GFR 38.6, Cr 1.67  CHA2DS2-VASc Score = 4 [CHF History: 1, HTN History: 1, Diabetes History: 0, Stroke History: 0, Vascular Disease History: 0, Age Score: 2, Gender Score: 0].  Therefore, the patient's annual risk of stroke is 4.8 %. HAS-BLED score 4 (8.7% yearly risk of major bleeding)  Will continue xarelto at this time for stroke prevention with AF. We discussed the Watchman Procedure, and that it would allow him to stop  xarelto while maintaining stroke prophylaxis. Patient and  wife will think about it and call back if they would like to move forward. Aware that Dr. Curt Bears does not perform procedure and would need appt with Dr. Quentin Ore.  We extensively discussed safety and fall prevention with ambulating. I recommend he use rolling walker all the time; sitting on bedside prior to standing, standing for a little bit prior to walking.  Also discussed relaxing BP goals to prevent hypotension, no med changes today. Should discuss medication titration with PCP whom he sees regularly    Follow up with Dr. Curt Bears in 6 months for routine follow-up, sooner with Dr. Quentin Ore if would like to proceed with Carola Rhine, NP  04/22/22 2:02 PM

## 2022-04-22 ENCOUNTER — Ambulatory Visit: Payer: Medicare PPO | Attending: Student | Admitting: Cardiology

## 2022-04-22 ENCOUNTER — Encounter: Payer: Self-pay | Admitting: Student

## 2022-04-22 VITALS — BP 120/70 | HR 70 | Ht 68.0 in | Wt 153.0 lb

## 2022-04-22 DIAGNOSIS — R2681 Unsteadiness on feet: Secondary | ICD-10-CM

## 2022-04-22 DIAGNOSIS — I482 Chronic atrial fibrillation, unspecified: Secondary | ICD-10-CM

## 2022-04-22 DIAGNOSIS — I4819 Other persistent atrial fibrillation: Secondary | ICD-10-CM

## 2022-04-22 DIAGNOSIS — I4821 Permanent atrial fibrillation: Secondary | ICD-10-CM | POA: Diagnosis not present

## 2022-04-22 DIAGNOSIS — D6869 Other thrombophilia: Secondary | ICD-10-CM

## 2022-04-22 DIAGNOSIS — R296 Repeated falls: Secondary | ICD-10-CM

## 2022-04-22 DIAGNOSIS — I10 Essential (primary) hypertension: Secondary | ICD-10-CM | POA: Diagnosis not present

## 2022-04-22 MED ORDER — CARVEDILOL 6.25 MG PO TABS: 6.2500 mg | ORAL_TABLET | Freq: Two times a day (BID) | ORAL | 1 refills | Status: AC

## 2022-04-22 MED ORDER — CARVEDILOL 6.25 MG PO TABS: 3.1250 mg | ORAL_TABLET | Freq: Two times a day (BID) | ORAL | 1 refills | Status: DC

## 2022-04-22 NOTE — Patient Instructions (Addendum)
Medication Instructions:  Your physician has recommended you make the following change in your medication: STOP TAKING: Amiodarone Today 04/22/2022.    Lab Work: None ordered.  If you have labs (blood work) drawn today and your tests are completely normal, you will receive your results only by: [place] (if you have MyChart) OR A paper copy in the mail If you have any lab test that is abnormal or we need to change your treatment, we will call you to review the results.  Testing/Procedures: None ordered.  Follow-Up: At [facility], you and your health needs are our priority.  As part of our continuing mission to provide you with exceptional heart care, we have created designated Provider Care Teams.  These Care Teams include your primary Cardiologist (physician) and Advanced Practice Providers (APPs -  Physician Assistants and Nurse Practitioners) who all work together to provide you with the care you need, when you need it.  We recommend signing up for the patient portal called "MyChart".  Sign up information is provided on this After Visit Summary.  MyChart is used to connect with patients for Virtual Visits (Telemedicine).  Patients are able to view lab/test results, encounter notes, upcoming appointments, etc.  Non-urgent messages can be sent to your provider as well.   To learn more about what you can do with MyChart, go to [email or web address].    Your next appointment:   Please schedule a 6 month follow up with Dr. Curt Bears  The format for your next appointment:   In Person  Provider:   Mamie Levers, NP   Important Information About [place]

## 2022-04-25 ENCOUNTER — Telehealth: Payer: Self-pay

## 2022-04-25 NOTE — Telephone Encounter (Signed)
        Patient  visited [facility] on 04/05/2022  for Unspecified fall, initial encounter.   Telephone encounter attempt :  1st  Patient was unable to talk and requested that I  call him back later this week.   [place] Resource Care Guide   [email or web address]  ?? [phone]   Website: [email or web address]  [email or web address]

## 2022-04-26 ENCOUNTER — Telehealth: Payer: Self-pay

## 2022-04-26 NOTE — Telephone Encounter (Signed)
        Patient  visited [facility] on 04/05/2022  for Unspecified fall, initial encounter.   Telephone encounter attempt :  2nd  Patient was unable to talk, request that I call him back later this week.     [place] Resource Care Guide   [email or web address]  ?? [phone]   Website: [email or web address]  [email or web address]

## 2022-04-27 ENCOUNTER — Telehealth: Payer: Self-pay

## 2022-04-27 NOTE — Telephone Encounter (Signed)
     Patient  visit on 04/05/2022  at [facility] was for Unspecified fall, initial encounter.  Have you been able to follow up with your primary care physician? Yes  The patient was or was not able to obtain any needed medicine or equipment. Patient obtained medication.  Are there diet recommendations that you are having difficulty following? No diet recommendations.  Patient expresses understanding of discharge instructions and education provided has no other needs at this time.   No community resources needed at this time.   [place] Resource Care Guide   [email or web address]  ?? [phone]   Website: [email or web address]  [email or web address]

## 2022-05-18 DIAGNOSIS — I8311 Varicose veins of right lower extremity with inflammation: Secondary | ICD-10-CM | POA: Diagnosis not present

## 2022-05-18 DIAGNOSIS — L570 Actinic keratosis: Secondary | ICD-10-CM | POA: Diagnosis not present

## 2022-05-18 DIAGNOSIS — I8312 Varicose veins of left lower extremity with inflammation: Secondary | ICD-10-CM | POA: Diagnosis not present

## 2022-05-18 DIAGNOSIS — L298 Other pruritus: Secondary | ICD-10-CM | POA: Diagnosis not present

## 2022-05-18 DIAGNOSIS — Z85828 Personal history of other malignant neoplasm of skin: Secondary | ICD-10-CM | POA: Diagnosis not present

## 2022-05-18 DIAGNOSIS — L821 Other seborrheic keratosis: Secondary | ICD-10-CM | POA: Diagnosis not present

## 2022-05-18 DIAGNOSIS — I872 Venous insufficiency (chronic) (peripheral): Secondary | ICD-10-CM | POA: Diagnosis not present

## 2022-05-18 DIAGNOSIS — C4362 Malignant melanoma of left upper limb, including shoulder: Secondary | ICD-10-CM | POA: Diagnosis not present

## 2022-05-22 DIAGNOSIS — J45909 Unspecified asthma, uncomplicated: Secondary | ICD-10-CM | POA: Diagnosis not present

## 2022-05-22 DIAGNOSIS — D631 Anemia in chronic kidney disease: Secondary | ICD-10-CM | POA: Diagnosis not present

## 2022-05-22 DIAGNOSIS — D509 Iron deficiency anemia, unspecified: Secondary | ICD-10-CM | POA: Diagnosis not present

## 2022-05-22 DIAGNOSIS — N529 Male erectile dysfunction, unspecified: Secondary | ICD-10-CM | POA: Diagnosis not present

## 2022-05-22 DIAGNOSIS — I4891 Unspecified atrial fibrillation: Secondary | ICD-10-CM | POA: Diagnosis not present

## 2022-05-22 DIAGNOSIS — I130 Hypertensive heart and chronic kidney disease with heart failure and stage 1 through stage 4 chronic kidney disease, or unspecified chronic kidney disease: Secondary | ICD-10-CM | POA: Diagnosis not present

## 2022-05-22 DIAGNOSIS — N184 Chronic kidney disease, stage 4 (severe): Secondary | ICD-10-CM | POA: Diagnosis not present

## 2022-05-22 DIAGNOSIS — I5032 Chronic diastolic (congestive) heart failure: Secondary | ICD-10-CM | POA: Diagnosis not present

## 2022-05-22 DIAGNOSIS — M109 Gout, unspecified: Secondary | ICD-10-CM | POA: Diagnosis not present

## 2022-06-01 ENCOUNTER — Other Ambulatory Visit: Payer: Self-pay | Admitting: Nurse Practitioner

## 2022-06-04 ENCOUNTER — Other Ambulatory Visit: Payer: Self-pay | Admitting: Cardiology

## 2022-06-06 ENCOUNTER — Encounter (HOSPITAL_BASED_OUTPATIENT_CLINIC_OR_DEPARTMENT_OTHER): Payer: Self-pay

## 2022-06-06 ENCOUNTER — Other Ambulatory Visit: Payer: Self-pay

## 2022-06-06 ENCOUNTER — Emergency Department (HOSPITAL_BASED_OUTPATIENT_CLINIC_OR_DEPARTMENT_OTHER)
Admission: EM | Admit: 2022-06-06 | Discharge: 2022-06-06 | Disposition: A | Discharge status: Home / Self Care | Payer: Medicare PPO | Attending: Emergency Medicine | Admitting: Emergency Medicine

## 2022-06-06 ENCOUNTER — Emergency Department (HOSPITAL_BASED_OUTPATIENT_CLINIC_OR_DEPARTMENT_OTHER): Payer: Medicare PPO

## 2022-06-06 DIAGNOSIS — Z23 Encounter for immunization: Secondary | ICD-10-CM | POA: Insufficient documentation

## 2022-06-06 DIAGNOSIS — I6381 Other cerebral infarction due to occlusion or stenosis of small artery: Secondary | ICD-10-CM | POA: Diagnosis not present

## 2022-06-06 DIAGNOSIS — Z79899 Other long term (current) drug therapy: Secondary | ICD-10-CM | POA: Insufficient documentation

## 2022-06-06 DIAGNOSIS — W01198A Fall on same level from slipping, tripping and stumbling with subsequent striking against other object, initial encounter: Secondary | ICD-10-CM | POA: Insufficient documentation

## 2022-06-06 DIAGNOSIS — M4312 Spondylolisthesis, cervical region: Secondary | ICD-10-CM | POA: Diagnosis not present

## 2022-06-06 DIAGNOSIS — S0990XA Unspecified injury of head, initial encounter: Secondary | ICD-10-CM

## 2022-06-06 DIAGNOSIS — S0101XA Laceration without foreign body of scalp, initial encounter: Secondary | ICD-10-CM | POA: Diagnosis not present

## 2022-06-06 DIAGNOSIS — Z7901 Long term (current) use of anticoagulants: Secondary | ICD-10-CM | POA: Insufficient documentation

## 2022-06-06 MED ORDER — TETANUS-DIPHTH-ACELL PERTUSSIS 5-2.5-18.5 LF-MCG/0.5 IM SUSY
0.5000 mL | PREFILLED_SYRINGE | Freq: Once | INTRAMUSCULAR | Status: AC
  Administered 2022-06-06: 0.5 mL via INTRAMUSCULAR
  Filled 2022-06-06: qty 0.5

## 2022-06-06 NOTE — ED Triage Notes (Signed)
States was trying to brace himself on a recliner and the recliner moved and he fell backwards hitting head. Laceration to posterior head. On Xarelto. Denies other injuries. States he was not dizzy prior to fall, denies LOC.

## 2022-06-06 NOTE — ED Provider Notes (Addendum)
[facility] Provider Note   CSN: [id number] Arrival date & time: 06/06/22  1349     History  Chief Complaint  Patient presents with   Fall   Head Injury    Austin [name] is a 89 y.o. male anticoagulated on Xarelto secondary to history of atrial fibrillation who presents to ED after mechanical fall where he hit his head sustaining scalp laceration. Pt states he was trying to brace himself on a recliner when it shifted causing him to fall backward. Last tetanus vaccination over 10 years ago. Pt has other small skin abrasions/tears to right lower extremity but denies other acute injuries. He denies significant headache, visual changes, nausea, vomiting, chest pain, shortness of breath, abdominal pain, or other complaints at this time. He has been able to ambulate since fall without difficulty and is otherwise at his baseline.      Home Medications Prior to Admission medications   Medication Sig Start Date End Date Taking? Authorizing Provider  albuterol (VENTOLIN HFA) 108 (90 Base) MCG/ACT inhaler Inhale 2 puffs into the lungs every 4 (four) hours as needed for wheezing or shortness of breath (cough).    [provider]  amLODipine (NORVASC) 2.5 MG tablet Take 1 tablet (2.5 mg total) by mouth daily. 06/01/22   Sherran Needs, NP  benzonatate (TESSALON) 100 MG capsule Take 100 mg by mouth 3 (three) times daily as needed for cough.    [provider]  budesonide-formoterol (SYMBICORT) 80-4.5 MCG/ACT inhaler Inhale 2 puffs into the lungs 2 (two) times daily.     [provider]  Butalbital-APAP-Caff-Cod 50-300-40-30 MG CAPS Take 1 capsule by mouth every 4 (four) hours as needed (migraine).    [provider]  carvedilol (COREG) 6.25 MG tablet Take 1 tablet (6.25 mg total) by mouth 2 (two) times daily. 04/22/22   Mamie Levers, NP  cholecalciferol (VITAMIN D3) 25 MCG (1000 UNIT) tablet Take 1,000 Units by  mouth in the morning.    [provider]  EPINEPHrine 0.3 mg/0.3 mL IJ SOAJ injection Inject 0.3 mg into the muscle as needed for anaphylaxis. 12/05/20   Petrucelli, Samantha R, PA-C  furosemide (LASIX) 20 MG tablet Take 20 mg by mouth as directed. M-W-F '40mg'$  tabs, other days '20mg'$  tabs    [provider]  guaifenesin (ROBITUSSIN) 100 MG/5ML syrup Take 400 mg by mouth at bedtime.    [provider]  hydrALAZINE (APRESOLINE) 10 MG tablet Take 10 mg by mouth 3 (three) times daily as needed (bp over 160/90). 03/07/22   [provider]  HYDROcodone-acetaminophen (NORCO/VICODIN) 5-325 MG tablet SMARTSIG:1 Tablet(s) By Mouth Every 12 Hours PRN 04/13/22   [provider]  hydrOXYzine (ATARAX) 25 MG tablet Take 50 mg by mouth daily as needed for itching.    [provider]  iron polysaccharides (NIFEREX) 150 MG capsule Take 150 mg by mouth in the morning.    [provider]  isosorbide mononitrate (IMDUR) 60 MG 24 hr tablet TAKE 1 TABLET BY MOUTH EVERY DAY 12/13/21   Camnitz, Ocie Doyne, MD  KLOR-CON M20 20 MEQ tablet TAKE 1 TABLET BY MOUTH EVERY DAY 12/20/21   Camnitz, Ocie Doyne, MD  MAGNESIUM CITRATE PO Take 250 mg by mouth at bedtime.    [provider]  Multiple Vitamins-Minerals (PRESERVISION AREDS 2) CHEW Chew 1 tablet by mouth in the morning and at bedtime.    [provider]  olmesartan (BENICAR) 40 MG tablet Take  1 tablet (40 mg total) by mouth every evening. 03/23/22   Sherran Needs, NP  Polyethyl Glycol-Propyl Glycol (SYSTANE) 0.4-0.3 % SOLN Apply 1-2 drops to eye 4 (four) times daily as needed (dry/irritated eyes.).    [provider]  Rivaroxaban (XARELTO) 15 MG TABS tablet Take 1 tablet (15 mg total) by mouth daily with supper. DO NOT RESTART until 11/22/2021 11/22/21   Ezequiel Essex, MD  sertraline (ZOLOFT) 50 MG tablet Take 50 mg by mouth at bedtime. 02/12/20   [provider]  tamsulosin  (FLOMAX) 0.4 MG CAPS capsule Take 0.4 mg by mouth daily with lunch.    [provider]  triamcinolone (NASACORT ALLERGY 24HR) 55 MCG/ACT AERO nasal inhaler Place 2 sprays into the nose 2 (two) times daily.    [provider]  valACYclovir (VALTREX) 1000 MG tablet Take 1,000 mg by mouth at bedtime.    [provider]  zolpidem (AMBIEN) 10 MG tablet Take 10 mg by mouth at bedtime.    [provider]      Allergies    Bee venom, Codeine, Iodine, Oxycodone, Penicillins, Benadryl [diphenhydramine], Diltiazem, Diatrizoate, Ivp dye [iodinated contrast media], Tape, and Yohimbine    Review of Systems   Review of Systems  Constitutional:  Negative for activity change, appetite change, chills and fever.  HENT:  Negative for ear pain and sore throat.   Eyes:  Negative for pain and visual disturbance.  Respiratory:  Negative for cough, chest tightness and shortness of breath.   Cardiovascular:  Negative for chest pain and palpitations.  Gastrointestinal:  Negative for abdominal pain, diarrhea, nausea and vomiting.  Genitourinary:  Negative for dysuria, flank pain and hematuria.  Musculoskeletal:  Negative for arthralgias, back pain, gait problem, neck pain and neck stiffness.  Skin:  Positive for wound. Negative for color change and rash.  Neurological:  Positive for headaches (mild with scalp laceration). Negative for dizziness, seizures, syncope, facial asymmetry, speech difficulty, weakness and light-headedness.  Hematological:  Bruises/bleeds easily (on Xarelto).  Psychiatric/Behavioral:  Negative for confusion.   All other systems reviewed and are negative.   Physical Exam Updated Vital Signs BP (!) 138/94   Pulse 84   Temp 97.7 F (36.5 C) (Oral)   Resp 15   Ht '5\' 8"'$  (1.727 m)   Wt 68 kg   SpO2 97%   BMI 22.81 kg/m  Physical Exam Vitals and nursing note reviewed.  Constitutional:      General: He is not in acute distress.    Appearance: Normal  appearance. He is not toxic-appearing.  HENT:     Head: Normocephalic.     Comments: 3 cm slightly irregular laceration to mid-posterior scalp, scant active bleeding, minimal tenderness, no significant hematoma    Nose: Nose normal.     Mouth/Throat:     Mouth: Mucous membranes are moist.  Eyes:     Extraocular Movements: Extraocular movements intact.     Conjunctiva/sclera: Conjunctivae normal.     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     Heart sounds: No murmur heard. Pulmonary:     Effort: Pulmonary effort is normal.     Breath sounds: Normal breath sounds.  Abdominal:     General: Abdomen is flat. There is no distension.     Palpations: Abdomen is soft.  Tenderness: There is no abdominal tenderness. There is no guarding or rebound.  Musculoskeletal:        General: No deformity. Normal range of motion.     Cervical back: Normal range of motion and neck supple. No tenderness.     Right lower leg: No edema.     Left lower leg: No edema.     Comments: All four extremities palpated and nontender with full range of motion and no obvious deformity, small skin tear x 2 with minimal surrounding abrasions, one superior to right knee and one inferior to right knee with no active bleeding, no tenderness, and have been dressed well by wife previous to today's visit using steri-strips  Skin:    General: Skin is warm and dry.     Capillary Refill: Capillary refill takes less than 2 seconds.  Neurological:     General: No focal deficit present.     Mental Status: He is alert and oriented to person, place, and time. Mental status is at baseline.     Cranial Nerves: No cranial nerve deficit.     Motor: No weakness.  Psychiatric:        Mood and Affect: Mood normal.        Behavior: Behavior normal.     ED  Results / Procedures / Treatments   Labs (all labs ordered are listed, but only abnormal results are displayed) Labs Reviewed - No data to display  EKG None  Radiology CT Head Wo Contrast  Result Date: 06/06/2022 CLINICAL DATA:  Head trauma, minor (Age >= 65y); Neck trauma (Age >= 65y). Fall. Posterior head laceration. EXAM: CT HEAD WITHOUT CONTRAST CT CERVICAL SPINE WITHOUT CONTRAST TECHNIQUE: Multidetector CT imaging of the head and cervical spine was performed following the standard protocol without intravenous contrast. Multiplanar CT image reconstructions of the cervical spine were also generated. RADIATION DOSE REDUCTION: This exam was performed according to the departmental dose-optimization program which includes automated exposure control, adjustment of the mA and/or kV according to patient size and/or use of iterative reconstruction technique. COMPARISON:  CT head and cervical spine 04/05/2022. FINDINGS: CT HEAD FINDINGS Brain: There is no evidence of an acute infarct, intracranial hemorrhage, mass, midline shift, or extra-axial fluid collection. Patchy hypodensities in the cerebral white matter bilaterally are similar to the prior CT and are nonspecific but compatible with mild-to-moderate chronic small vessel ischemic disease. Chronic lacunar infarcts are again noted in the basal ganglia, right greater than left. There is mild-to-moderate cerebral atrophy. Vascular: Calcified atherosclerosis at the skull base. No hyperdense vessel. Skull: No acute fracture or suspicious osseous lesion. Sinuses/Orbits: Partially visualized mucosal thickening in the right maxillary sinus. 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 Trace right mastoid fluid. Right ocular prosthesis. Left cataract extraction. Other: None. CT CERVICAL SPINE FINDINGS Alignment: Unchanged alignment including grade 1 anterolisthesis of C2 on C3, C3 on C4, C4 on C5, and C7 on T1 and grade 1 retrolisthesis of C5 on C6. Skull base and  vertebrae: No acute fracture or suspicious osseous lesion. Soft tissues and spinal canal: No prevertebral fluid or swelling. No visible canal hematoma. Disc levels: Moderate to severe disc space narrowing at C3-4 and C5-6 and mild narrowing at C4-5 and C6-7. Asymmetrically advanced right-sided facet arthrosis from C2-3 through C6-7. Advanced bilateral facet arthrosis at C7-T1. Moderate neural foraminal stenosis on the right at C3-4 and C4-5 and bilaterally at C5-6 due to uncovertebral and facet spurring. Likely mild multilevel spinal stenosis. Upper chest: The visualized lung  apices are clear. Other: Mild calcific atherosclerosis at the left greater than right carotid bifurcations. IMPRESSION: 1. No evidence of acute intracranial abnormality or acute cervical spine fracture. 2. Mild-to-moderate chronic small vessel ischemic disease. 3. Advanced cervical disc and facet degeneration. Electronically Signed   By: Logan Bores M.D.   On: 06/06/2022 14:48   CT Cervical Spine Wo Contrast  Result Date: 06/06/2022 CLINICAL DATA:  Head trauma, minor (Age >= 65y); Neck trauma (Age >= 65y). Fall. Posterior head laceration. EXAM: CT HEAD WITHOUT CONTRAST CT CERVICAL SPINE WITHOUT CONTRAST TECHNIQUE: Multidetector CT imaging of the head and cervical spine was performed following the standard protocol without intravenous contrast. Multiplanar CT image reconstructions of the cervical spine were also generated. RADIATION DOSE REDUCTION: This exam was performed according to the departmental dose-optimization program which includes automated exposure control, adjustment of the mA and/or kV according to patient size and/or use of iterative reconstruction technique. COMPARISON:  CT head and cervical spine 04/05/2022. FINDINGS: CT HEAD FINDINGS Brain: There is no evidence of an acute infarct, intracranial hemorrhage, mass, midline shift, or extra-axial fluid collection. Patchy hypodensities in the cerebral white matter bilaterally  are similar to the prior CT and are nonspecific but compatible with mild-to-moderate chronic small vessel ischemic disease. Chronic lacunar infarcts are again noted in the basal ganglia, right greater than left. There is mild-to-moderate cerebral atrophy. Vascular: Calcified atherosclerosis at the skull base. No hyperdense vessel. Skull: No acute fracture or suspicious osseous lesion. Sinuses/Orbits: Partially visualized mucosal thickening in the right maxillary sinus. 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 Trace right mastoid fluid. Right ocular prosthesis. Left cataract extraction. Other: None. CT CERVICAL SPINE FINDINGS Alignment: Unchanged alignment including grade 1 anterolisthesis of C2 on C3, C3 on C4, C4 on C5, and C7 on T1 and grade 1 retrolisthesis of C5 on C6. Skull base and vertebrae: No acute fracture or suspicious osseous lesion. Soft tissues and spinal canal: No prevertebral fluid or swelling. No visible canal hematoma. Disc levels: Moderate to severe disc space narrowing at C3-4 and C5-6 and mild narrowing at C4-5 and C6-7. Asymmetrically advanced right-sided facet arthrosis from C2-3 through C6-7. Advanced bilateral facet arthrosis at C7-T1. Moderate neural foraminal stenosis on the right at C3-4 and C4-5 and bilaterally at C5-6 due to uncovertebral and facet spurring. Likely mild multilevel spinal stenosis. Upper chest: The visualized lung apices are clear. Other: Mild calcific atherosclerosis at the left greater than right carotid bifurcations. IMPRESSION: 1. No evidence of acute intracranial abnormality or acute cervical spine fracture. 2. Mild-to-moderate chronic small vessel ischemic disease. 3. Advanced cervical disc and facet degeneration. Electronically Signed   By: Logan Bores M.D.   On: 06/06/2022 14:48    Procedures .Marland KitchenLaceration Repair  Date/Time: 06/06/2022 3:15 PM  Performed by: Suzzette Righter, PA-C Authorized by: Suzzette Righter, PA-C   Consent:    Consent obtained:   Verbal   Consent given by:  Patient   Risks, benefits, and alternatives were discussed: yes     Risks discussed:  Infection, pain, retained foreign body, need for additional repair, poor cosmetic result and poor wound healing   Alternatives discussed:  No treatment, delayed treatment and observation Universal protocol:    Procedure explained and questions answered to patient or proxy's satisfaction: yes     Test results available: yes     Imaging studies available: yes     Required blood products, implants, devices, and special equipment available: no     Immediately prior to procedure,  a time out was called: yes     Patient identity confirmed:  Verbally with patient Anesthesia:    Anesthesia method:  None Laceration details:    Location:  Scalp   Scalp location:  Mid-scalp   Length (cm):  3 Pre-procedure details:    Preparation:  Patient was prepped and draped in usual sterile fashion and imaging obtained to evaluate for foreign bodies Exploration:    Limited defect created (wound extended): no     Hemostasis achieved with:  Direct pressure   Imaging obtained comment:  CT   Imaging outcome: foreign body not noted     Wound exploration: entire depth of wound visualized     Wound extent: no foreign body, no signs of injury and no underlying fracture     Contaminated: no   Treatment:    Area cleansed with:  Povidone-iodine   Amount of cleaning:  Standard   Irrigation solution:  Sterile saline   Irrigation volume:  12m   Irrigation method:  Tap   Visualized foreign bodies/material removed: no     Debridement:  None   Undermining:  None   Scar revision: no   Skin repair:    Repair method:  Staples   Number of staples:  2 Approximation:    Approximation:  Close Repair type:    Repair type:  Simple Post-procedure details:    Dressing:  Non-adherent dressing   Procedure completion:  Tolerated well, no immediate complications    Medications Ordered in ED Medications   Tdap (BOOSTRIX) injection 0.5 mL (0.5 mLs Intramuscular Given 06/06/22 1544)    ED Course/ Medical Decision Making/ A&P                           Medical Decision Making Amount and/or Complexity of Data Reviewed Radiology: ordered. Decision-making details documented in ED Course.  Risk Prescription drug management.   89year old male anticoagulated on Xarelto presenting to ED for acute head injury and scalp laceration post mechanical fall. Differential including but not limited to epidural hematoma, subdural hematoma, subarachnoid hemorrhage, fracture, contusion, uncomplicated laceration. CT head and cervical spine unremarkable. On exam, pt is neurologically intact and at baseline. He has 3 cm laceration to mid-scalp and small skin tears to right lower extremity but range of motion is intact and pt has no other complaints and is ambulatory at his baseline. Laceration repaired with 2 staples. Pt tolerated well without complication. No significant bleeding. Discussed findings, wound care, and return precautions with pt and wife (who is a retired nMarine scientistand pt's primary care giver) who are agreeable with treatment plan including discharge home and aware that staples should be removed in 7-10 days. Tdap updated. All questions answered and pt stable for discharge.    Final Clinical Impression(s) / ED Diagnoses Final diagnoses:  Laceration of scalp, initial encounter  Injury of head, initial encounter    Rx / DC Orders ED Discharge Orders     None         GSuzzette Righter PA-C 06/07/22 1456    GSuzzette Righter PA-C 06/07/22 1457    SGareth Morgan MD 06/07/22 2149

## 2022-06-06 NOTE — Discharge Instructions (Signed)
Thank you for letting us take care of you.   Your CT scan did not show any acute bleed or other injuries. Your tetanus vaccination was updated and we placed 2 staples to the laceration on your scalp. Please have these removed in 7-10 days.  If you develop concern for wound infection such as erythema, drainage, or swelling, please be re-evaluated. If you develop other symptoms such as a severe headache, confusion, loss of consciousness, weakness on one side of your body or another, or other concerns, please return to nearest emergency department for re-evaluation.

## 2022-06-11 ENCOUNTER — Other Ambulatory Visit: Payer: Self-pay

## 2022-06-11 ENCOUNTER — Emergency Department (HOSPITAL_BASED_OUTPATIENT_CLINIC_OR_DEPARTMENT_OTHER)
Admission: EM | Admit: 2022-06-11 | Discharge: 2022-06-11 | Disposition: A | Discharge status: Home / Self Care | Payer: Medicare PPO | Attending: Emergency Medicine | Admitting: Emergency Medicine

## 2022-06-11 ENCOUNTER — Emergency Department (HOSPITAL_BASED_OUTPATIENT_CLINIC_OR_DEPARTMENT_OTHER): Payer: Medicare PPO

## 2022-06-11 DIAGNOSIS — S0101XA Laceration without foreign body of scalp, initial encounter: Secondary | ICD-10-CM | POA: Insufficient documentation

## 2022-06-11 DIAGNOSIS — I4891 Unspecified atrial fibrillation: Secondary | ICD-10-CM | POA: Diagnosis not present

## 2022-06-11 DIAGNOSIS — Z7901 Long term (current) use of anticoagulants: Secondary | ICD-10-CM | POA: Insufficient documentation

## 2022-06-11 DIAGNOSIS — J449 Chronic obstructive pulmonary disease, unspecified: Secondary | ICD-10-CM | POA: Diagnosis not present

## 2022-06-11 DIAGNOSIS — S0990XA Unspecified injury of head, initial encounter: Secondary | ICD-10-CM | POA: Diagnosis not present

## 2022-06-11 DIAGNOSIS — W19XXXA Unspecified fall, initial encounter: Secondary | ICD-10-CM

## 2022-06-11 DIAGNOSIS — W1830XA Fall on same level, unspecified, initial encounter: Secondary | ICD-10-CM | POA: Insufficient documentation

## 2022-06-11 DIAGNOSIS — I6381 Other cerebral infarction due to occlusion or stenosis of small artery: Secondary | ICD-10-CM | POA: Diagnosis not present

## 2022-06-11 MED ORDER — LIDOCAINE-EPINEPHRINE-TETRACAINE (LET) TOPICAL GEL
3.0000 mL | Freq: Once | TOPICAL | Status: AC
  Administered 2022-06-11: 3 mL via TOPICAL
  Filled 2022-06-11: qty 3

## 2022-06-11 MED ORDER — LIDOCAINE HCL (PF) 1 % IJ SOLN
10.0000 mL | Freq: Once | INTRAMUSCULAR | Status: AC
  Administered 2022-06-11: 10 mL
  Filled 2022-06-11: qty 10

## 2022-06-11 MED ORDER — ACETAMINOPHEN 325 MG PO TABS
650.0000 mg | ORAL_TABLET | Freq: Once | ORAL | Status: AC
  Administered 2022-06-11: 650 mg via ORAL
  Filled 2022-06-11: qty 2

## 2022-06-11 NOTE — ED Provider Notes (Signed)
[facility] Provider Note   CSN: [id number] Arrival date & time: 06/11/22  1552     History {Add pertinent medical, surgical, social history, OB history to HPI:1} Chief Complaint  Patient presents with   Austin [name] is a 89 y.o. male with a past medical history significant for COPD, A-fib on chronic Xarelto, OSA who presents to the ED after mechanical fall.  Patient states he was reaching for something, lost his balance, and fell.  Patient typically ambulates with a walker.  Patient hit his head on his walker.  Laceration to crown of head.  Patient is currently on Xarelto.  Denies visual changes, speech changes, unilateral weakness.  Denies neck pain.  Patient also sustained a laceration to left elbow.  Patient had his tetanus updated on Monday after a mechanical fall.  History obtained from patient and past medical records. No interpreter used during encounter.       Home Medications Prior to Admission medications   Medication Sig Start Date End Date Taking? Authorizing Provider  albuterol (VENTOLIN HFA) 108 (90 Base) MCG/ACT inhaler Inhale 2 puffs into the lungs every 4 (four) hours as needed for wheezing or shortness of breath (cough).    [provider]  amLODipine (NORVASC) 2.5 MG tablet Take 1 tablet (2.5 mg total) by mouth daily. 06/01/22   Sherran Needs, NP  benzonatate (TESSALON) 100 MG capsule Take 100 mg by mouth 3 (three) times daily as needed for cough.    [provider]  budesonide-formoterol (SYMBICORT) 80-4.5 MCG/ACT inhaler Inhale 2 puffs into the lungs 2 (two) times daily.     [provider]  Butalbital-APAP-Caff-Cod 50-300-40-30 MG CAPS Take 1 capsule by mouth every 4 (four) hours as needed (migraine).    [provider]  carvedilol (COREG) 6.25 MG tablet Take 1 tablet (6.25 mg total) by mouth 2 (two) times daily. 04/22/22   Mamie Levers, NP  cholecalciferol (VITAMIN  D3) 25 MCG (1000 UNIT) tablet Take 1,000 Units by mouth in the morning.    [provider]  EPINEPHrine 0.3 mg/0.3 mL IJ SOAJ injection Inject 0.3 mg into the muscle as needed for anaphylaxis. 12/05/20   Petrucelli, Samantha R, PA-C  furosemide (LASIX) 20 MG tablet Take 20 mg by mouth as directed. M-W-F '40mg'$  tabs, other days '20mg'$  tabs    [provider]  guaifenesin (ROBITUSSIN) 100 MG/5ML syrup Take 400 mg by mouth at bedtime.    [provider]  hydrALAZINE (APRESOLINE) 10 MG tablet Take 10 mg by mouth 3 (three) times daily as needed (bp over 160/90). 03/07/22   [provider]  HYDROcodone-acetaminophen (NORCO/VICODIN) 5-325 MG tablet SMARTSIG:1 Tablet(s) By Mouth Every 12 Hours PRN 04/13/22   [provider]  hydrOXYzine (ATARAX) 25 MG tablet Take 50 mg by mouth daily as needed for itching.    [provider]  iron polysaccharides (NIFEREX) 150 MG capsule Take 150 mg by mouth in the morning.    [provider]  isosorbide mononitrate (IMDUR) 60 MG 24 hr tablet TAKE 1 TABLET BY MOUTH EVERY DAY 12/13/21   Camnitz, Ocie Doyne, MD  KLOR-CON M20 20 MEQ tablet TAKE 1 TABLET BY MOUTH EVERY DAY 12/20/21   Camnitz, Ocie Doyne, MD  MAGNESIUM CITRATE PO Take 250 mg by mouth at bedtime.    [provider]  Multiple Vitamins-Minerals (PRESERVISION AREDS 2) CHEW Chew 1 tablet by mouth in the morning and at bedtime.  [provider]  olmesartan (BENICAR) 40 MG tablet Take 1 tablet (40 mg total) by mouth every evening. 03/23/22   Sherran Needs, NP  Polyethyl Glycol-Propyl Glycol (SYSTANE) 0.4-0.3 % SOLN Apply 1-2 drops to eye 4 (four) times daily as needed (dry/irritated eyes.).    [provider]  Rivaroxaban (XARELTO) 15 MG TABS tablet Take 1 tablet (15 mg total) by mouth daily with supper. DO NOT RESTART until 11/22/2021 11/22/21   Ezequiel Essex, MD  sertraline (ZOLOFT) 50 MG tablet Take 50 mg by mouth at bedtime.  02/12/20   [provider]  tamsulosin (FLOMAX) 0.4 MG CAPS capsule Take 0.4 mg by mouth daily with lunch.    [provider]  triamcinolone (NASACORT ALLERGY 24HR) 55 MCG/ACT AERO nasal inhaler Place 2 sprays into the nose 2 (two) times daily.    [provider]  valACYclovir (VALTREX) 1000 MG tablet Take 1,000 mg by mouth at bedtime.    [provider]  zolpidem (AMBIEN) 10 MG tablet Take 10 mg by mouth at bedtime.    [provider]      Allergies    Bee venom, Codeine, Iodine, Oxycodone, Penicillins, Benadryl [diphenhydramine], Diltiazem, Diatrizoate, Ivp dye [iodinated contrast media], Tape, and Yohimbine    Review of Systems   Review of Systems  Physical Exam Updated Vital Signs BP (!) 178/116   Pulse 70   Temp 97.8 F (36.6 C)   Resp 18   Ht '5\' 8"'$  (1.727 m)   Wt 68 kg   SpO2 95%   BMI 22.81 kg/m  Physical Exam Vitals and nursing note reviewed.  Constitutional:      General: He is not in acute distress.    Appearance: He is not ill-appearing.  HENT:     Head: Normocephalic.     Comments: V-shaped laceration each laceration 4cm. Smaller lacerations surrounding Eyes:     Pupils: Pupils are equal, round, and reactive to light.     Comments: Missing right eye  Neck:     Comments: No cervical midline tenderness Cardiovascular:     Rate and Rhythm: Normal rate and regular rhythm.     Pulses: Normal pulses.     Heart sounds: Normal heart sounds. No murmur heard.    No friction rub. No gallop.  Pulmonary:     Effort: Pulmonary effort is normal.     Breath sounds: Normal breath sounds.  Abdominal:     General: Abdomen is flat. There is no distension.     Palpations: Abdomen is soft.     Tenderness: There is no abdominal tenderness. There is no guarding or rebound.  Musculoskeletal:        General: Normal range of motion.     Cervical back: Neck supple.  Skin:    General: Skin is warm and dry.  Neurological:     General:  No focal deficit present.     Mental Status: He is alert.     Comments: Speech is clear, able to follow commands CN III-XII intact Normal strength in upper and lower extremities bilaterally including dorsiflexion and plantar flexion, strong and equal grip strength Sensation grossly intact throughout Moves extremities without ataxia, coordination intact No pronator drift  Psychiatric:        Mood and Affect: Mood normal.        Behavior: Behavior normal.     ED Results / Procedures / Treatments   Labs (all labs ordered are listed, but only abnormal results are displayed)  Labs Reviewed - No data to display  EKG None  Radiology CT Head Wo Contrast  Result Date: 06/11/2022 CLINICAL DATA:  Head trauma, minor (Age >= 65y) EXAM: CT HEAD WITHOUT CONTRAST TECHNIQUE: Contiguous axial images were obtained from the base of the skull through the vertex without intravenous contrast. RADIATION DOSE REDUCTION: This exam was performed according to the departmental dose-optimization program which includes automated exposure control, adjustment of the mA and/or kV according to patient size and/or use of iterative reconstruction technique. COMPARISON:  06/06/2022 FINDINGS: Brain: No evidence of acute infarction, hemorrhage, hydrocephalus, extra-axial collection or mass lesion/mass effect. Multiple remote bilateral basal ganglia lacunar infarcts. Extensive low-density changes within the periventricular and subcortical white matter compatible with chronic microvascular ischemic change. Moderate diffuse cerebral volume loss. Vascular: Atherosclerotic calcifications involving the large vessels of the skull base. No unexpected hyperdense vessel. Skull: Normal. Negative for fracture or focal lesion. Sinuses/Orbits: Paranasal sinuses are clear. Unchanged appearance of right globe prosthesis. Other: High left parietal scalp laceration. Posterior right parietal surgical staples. IMPRESSION: 1. No acute intracranial  abnormality. 2. High left parietal scalp laceration. No underlying calvarial fracture. 3. Chronic microvascular ischemic change and cerebral volume loss. Electronically Signed   By: Davina Poke D.O.   On: 06/11/2022 17:15    Procedures .Marland KitchenLaceration Repair  Date/Time: 06/11/2022 10:21 PM  Performed by: Suzy Bouchard, PA-C Authorized by: Suzy Bouchard, PA-C   Consent:    Consent obtained:  Verbal   Consent given by:  Patient   Risks, benefits, and alternatives were discussed: yes     Risks discussed:  Pain, poor cosmetic result and poor wound healing   Alternatives discussed:  No treatment and delayed treatment Universal protocol:    Procedure explained and questions answered to patient or proxy's satisfaction: yes     Relevant documents present and verified: yes     Test results available: yes     Imaging studies available: yes     Required blood products, implants, devices, and special equipment available: yes     Site/side marked: yes     Patient identity confirmed:  Verbally with patient Anesthesia:    Anesthesia method:  Topical application and local infiltration   Topical anesthetic:  LET   Local anesthetic:  Lidocaine 1% w/o epi Laceration details:    Location:  Scalp   Scalp location:  L parietal   Length (cm):  8   Depth (mm):  2 Pre-procedure details:    Preparation:  Patient was prepped and draped in usual sterile fashion and imaging obtained to evaluate for foreign bodies Exploration:    Limited defect created (wound extended): no     Hemostasis achieved with:  Direct pressure   Imaging obtained comment:  CT head   Imaging outcome: foreign body not noted     Wound exploration: wound explored through full range of motion and entire depth of wound visualized     Wound extent: no foreign body and no underlying fracture     Contaminated: no   Treatment:    Area cleansed with:  Saline   Amount of cleaning:  Standard   Irrigation solution:  Sterile  saline   Irrigation volume:  76   Irrigation method:  Syringe   Visualized foreign bodies/material removed: no     Debridement:  None   Undermining:  None   Scar revision: no   Skin repair:    Repair method:  Staples   Number of staples:  11 Approximation:  Approximation:  Close Repair type:    Repair type:  Intermediate Post-procedure details:    Dressing:  Non-adherent dressing   Procedure completion:  Tolerated well, no immediate complications   {Document cardiac monitor, telemetry assessment procedure when appropriate:1}  Medications Ordered in ED Medications  lidocaine (PF) (XYLOCAINE) 1 % injection 10 mL (has no administration in time range)  lidocaine-EPINEPHrine-tetracaine (LET) topical gel (has no administration in time range)  acetaminophen (TYLENOL) tablet 650 mg (has no administration in time range)    ED Course/ Medical Decision Making/ A&P   {   Click here for ABCD2, HEART and other calculatorsREFRESH Note before signing :1}                          Medical Decision Making Amount and/or Complexity of Data Reviewed Independent Historian: spouse Radiology: ordered and independent interpretation performed. Decision-making details documented in ED Course.  Risk OTC drugs. Prescription drug management.   This patient presents to the ED for concern of fall, this involves an extensive number of treatment options, and is a complaint that carries with it a high risk of complications and morbidity.  The differential diagnosis includes intracranial bleed, bony fracture, etc  89 year old male presents to the ED after mechanical fall.  Patient lost his balance and hit his head on his walker.  Currently on Xarelto due to A-fib.  No LOC.  Denies visual changes, speech changes, and unilateral weakness.  Upon arrival, patient afebrile, not tachycardic or hypoxic.  Unfortunately, patient waited over 5 hours prior to initial evaluation due to long wait times.  Physical exam  significant for 4 cm laceration to top of scalp.  Normal neurological exam without any neurological deficits.  CT head ordered at triage.  Discussed with patient need for CT cervical spine to rule out any bony fractures however, patient and wife at bedside declined.  No cervical midline tenderness.  Bilateral upper extremities with equal grip strength.  Low suspicion for central cord compression.  CT head personally reviewed and interpreted which negative for any intracranial bleeding.  Does demonstrate a left parietal scalp laceration.  Chronic microvascular ischemic changes.  Laceration repair as noted above. Patient stable for discharge. Strict ED precautions discussed with patient. Patient states understanding and agrees to plan. Patient discharged home in no acute distress and stable vitals  Has PCP Hx. A fib on Xarelto {Document critical care time when appropriate:1} {Document review of labs and clinical decision tools ie heart score, Chads2Vasc2 etc:1}  {Document your independent review of radiology images, and any outside records:1} {Document your discussion with family members, caretakers, and with consultants:1} {Document social determinants of health affecting pt's care:1} {Document your decision making why or why not admission, treatments were needed:1} Final Clinical Impression(s) / ED Diagnoses Final diagnoses:  Fall, initial encounter  Injury of head, initial encounter  Laceration of scalp, initial encounter    Rx / DC Orders ED Discharge Orders     None

## 2022-06-11 NOTE — ED Triage Notes (Signed)
Patient presents to ED via POV from home. Here post fall. Patient was reaching for something when he lost his balance, causing him to fall from standing. Patient hit his head on his rolling walker. Laceration noted to crown of head. No active bleeding. Dressing placed. Patient is on Xarelto.

## 2022-06-11 NOTE — Discharge Instructions (Signed)
It was a pleasure taking care of you today. As discussed, your CT head did not show any evidence of bleeding in your brain. You will need your staples removed in 7-10 days. Follow-up with PCP within 1 week for further evaluation. Return to the ER for new or worsening symptoms.

## 2022-06-11 NOTE — ED Notes (Signed)
Lido, neeedles, stapler at bedside

## 2022-06-11 NOTE — ED Notes (Signed)
Pt stable at time of discharge. A&OX4. RR even and unlabored. No acute distress noted. Pt and family members verbalized understanding of discharge instructions as discussed. Pt assisted to POV in wheelchair.

## 2022-06-16 DIAGNOSIS — L97929 Non-pressure chronic ulcer of unspecified part of left lower leg with unspecified severity: Secondary | ICD-10-CM | POA: Diagnosis not present

## 2022-06-16 DIAGNOSIS — S0101XA Laceration without foreign body of scalp, initial encounter: Secondary | ICD-10-CM | POA: Diagnosis not present

## 2022-06-16 DIAGNOSIS — N1831 Chronic kidney disease, stage 3a: Secondary | ICD-10-CM | POA: Diagnosis not present

## 2022-06-16 DIAGNOSIS — T148XXA Other injury of unspecified body region, initial encounter: Secondary | ICD-10-CM | POA: Diagnosis not present

## 2022-06-16 DIAGNOSIS — W19XXXA Unspecified fall, initial encounter: Secondary | ICD-10-CM | POA: Diagnosis not present

## 2022-06-16 DIAGNOSIS — I87312 Chronic venous hypertension (idiopathic) with ulcer of left lower extremity: Secondary | ICD-10-CM | POA: Diagnosis not present

## 2022-06-16 DIAGNOSIS — I130 Hypertensive heart and chronic kidney disease with heart failure and stage 1 through stage 4 chronic kidney disease, or unspecified chronic kidney disease: Secondary | ICD-10-CM | POA: Diagnosis not present

## 2022-06-16 DIAGNOSIS — I5032 Chronic diastolic (congestive) heart failure: Secondary | ICD-10-CM | POA: Diagnosis not present

## 2022-06-16 DIAGNOSIS — Z4889 Encounter for other specified surgical aftercare: Secondary | ICD-10-CM | POA: Diagnosis not present

## 2022-06-21 DIAGNOSIS — I87312 Chronic venous hypertension (idiopathic) with ulcer of left lower extremity: Secondary | ICD-10-CM | POA: Diagnosis not present

## 2022-06-21 DIAGNOSIS — I130 Hypertensive heart and chronic kidney disease with heart failure and stage 1 through stage 4 chronic kidney disease, or unspecified chronic kidney disease: Secondary | ICD-10-CM | POA: Diagnosis not present

## 2022-06-21 DIAGNOSIS — N1831 Chronic kidney disease, stage 3a: Secondary | ICD-10-CM | POA: Diagnosis not present

## 2022-06-21 DIAGNOSIS — L97929 Non-pressure chronic ulcer of unspecified part of left lower leg with unspecified severity: Secondary | ICD-10-CM | POA: Diagnosis not present

## 2022-06-21 DIAGNOSIS — S0101XD Laceration without foreign body of scalp, subsequent encounter: Secondary | ICD-10-CM | POA: Diagnosis not present

## 2022-06-21 DIAGNOSIS — Z4802 Encounter for removal of sutures: Secondary | ICD-10-CM | POA: Diagnosis not present

## 2022-06-21 DIAGNOSIS — I5032 Chronic diastolic (congestive) heart failure: Secondary | ICD-10-CM | POA: Diagnosis not present

## 2022-06-21 DIAGNOSIS — L97919 Non-pressure chronic ulcer of unspecified part of right lower leg with unspecified severity: Secondary | ICD-10-CM | POA: Diagnosis not present

## 2022-06-21 DIAGNOSIS — I87311 Chronic venous hypertension (idiopathic) with ulcer of right lower extremity: Secondary | ICD-10-CM | POA: Diagnosis not present

## 2022-06-23 ENCOUNTER — Encounter (HOSPITAL_COMMUNITY): Payer: Self-pay | Admitting: *Deleted

## 2022-06-29 ENCOUNTER — Other Ambulatory Visit: Payer: Self-pay | Admitting: Cardiology

## 2022-07-03 ENCOUNTER — Other Ambulatory Visit: Payer: Self-pay | Admitting: Cardiology

## 2022-07-14 DIAGNOSIS — Z85828 Personal history of other malignant neoplasm of skin: Secondary | ICD-10-CM | POA: Diagnosis not present

## 2022-07-14 DIAGNOSIS — C4362 Malignant melanoma of left upper limb, including shoulder: Secondary | ICD-10-CM | POA: Diagnosis not present

## 2022-07-14 DIAGNOSIS — D487 Neoplasm of uncertain behavior of other specified sites: Secondary | ICD-10-CM | POA: Diagnosis not present

## 2022-07-14 DIAGNOSIS — Z85820 Personal history of malignant melanoma of skin: Secondary | ICD-10-CM | POA: Diagnosis not present

## 2022-07-26 ENCOUNTER — Emergency Department (HOSPITAL_BASED_OUTPATIENT_CLINIC_OR_DEPARTMENT_OTHER): Payer: Medicare PPO | Admitting: Radiology

## 2022-07-26 ENCOUNTER — Emergency Department (HOSPITAL_BASED_OUTPATIENT_CLINIC_OR_DEPARTMENT_OTHER): Payer: Medicare PPO

## 2022-07-26 ENCOUNTER — Other Ambulatory Visit: Payer: Self-pay

## 2022-07-26 ENCOUNTER — Encounter (HOSPITAL_BASED_OUTPATIENT_CLINIC_OR_DEPARTMENT_OTHER): Payer: Self-pay | Admitting: Emergency Medicine

## 2022-07-26 ENCOUNTER — Emergency Department (HOSPITAL_BASED_OUTPATIENT_CLINIC_OR_DEPARTMENT_OTHER)
Admission: EM | Admit: 2022-07-26 | Discharge: 2022-07-26 | Disposition: A | Discharge status: Home / Self Care | Payer: Medicare PPO | Attending: Emergency Medicine | Admitting: Emergency Medicine

## 2022-07-26 DIAGNOSIS — W1839XA Other fall on same level, initial encounter: Secondary | ICD-10-CM | POA: Insufficient documentation

## 2022-07-26 DIAGNOSIS — S0101XA Laceration without foreign body of scalp, initial encounter: Secondary | ICD-10-CM | POA: Insufficient documentation

## 2022-07-26 DIAGNOSIS — S199XXA Unspecified injury of neck, initial encounter: Secondary | ICD-10-CM | POA: Diagnosis not present

## 2022-07-26 DIAGNOSIS — S300XXA Contusion of lower back and pelvis, initial encounter: Secondary | ICD-10-CM | POA: Diagnosis not present

## 2022-07-26 DIAGNOSIS — Z7901 Long term (current) use of anticoagulants: Secondary | ICD-10-CM | POA: Diagnosis not present

## 2022-07-26 DIAGNOSIS — S61411A Laceration without foreign body of right hand, initial encounter: Secondary | ICD-10-CM | POA: Insufficient documentation

## 2022-07-26 DIAGNOSIS — M25531 Pain in right wrist: Secondary | ICD-10-CM | POA: Diagnosis not present

## 2022-07-26 DIAGNOSIS — S61419A Laceration without foreign body of unspecified hand, initial encounter: Secondary | ICD-10-CM

## 2022-07-26 DIAGNOSIS — S0990XA Unspecified injury of head, initial encounter: Secondary | ICD-10-CM | POA: Diagnosis not present

## 2022-07-26 DIAGNOSIS — W19XXXA Unspecified fall, initial encounter: Secondary | ICD-10-CM

## 2022-07-26 DIAGNOSIS — M79641 Pain in right hand: Secondary | ICD-10-CM | POA: Diagnosis not present

## 2022-07-26 DIAGNOSIS — M25551 Pain in right hip: Secondary | ICD-10-CM | POA: Diagnosis not present

## 2022-07-26 MED ORDER — LIDOCAINE-EPINEPHRINE (PF) 2 %-1:200000 IJ SOLN
10.0000 mL | Freq: Once | INTRAMUSCULAR | Status: AC
  Administered 2022-07-26: 10 mL
  Filled 2022-07-26: qty 20

## 2022-07-26 NOTE — ED Triage Notes (Signed)
Pt reports when standing from his table he reached to get his walker and fell backwards. Pt has hematoma to the back of the head. Pt taking xarelto. Pt also c/o right hand/wrist.

## 2022-07-26 NOTE — ED Notes (Signed)
Pt given discharge instructions. Opportunities given for questions. Pt verbalizes understanding. PIV removed x1. Madai Nuccio R, RN 

## 2022-07-26 NOTE — ED Provider Notes (Signed)
[facility] Provider Note   CSN: [id number] Arrival date & time: 07/26/22  [id number]     History  Chief Complaint  Patient presents with   Lytle Michaels    Austin [name] is a 89 y.o. male.  HPI 89 year old male presents today after fall.  Patient states that he has had several falls due to balance problems.  He reports this morning that he over balance and fell backwards.  He has pain in the back of his head, his left buttock, and right wrist.  His son-in-law came over to help him get up.  He was able to walk without difficulty after he was helped up from the ground.  He denies loss of consciousness.  He is on Xarelto and has a here is history of subdural hematoma.    Home Medications Prior to Admission medications   Medication Sig Start Date End Date Taking? Authorizing Provider  albuterol (VENTOLIN HFA) 108 (90 Base) MCG/ACT inhaler Inhale 2 puffs into the lungs every 4 (four) hours as needed for wheezing or shortness of breath (cough).    [provider]  amLODipine (NORVASC) 2.5 MG tablet Take 1 tablet (2.5 mg total) by mouth daily. 06/01/22   Sherran Needs, NP  benzonatate (TESSALON) 100 MG capsule Take 100 mg by mouth 3 (three) times daily as needed for cough.    [provider]  budesonide-formoterol (SYMBICORT) 80-4.5 MCG/ACT inhaler Inhale 2 puffs into the lungs 2 (two) times daily.     [provider]  Butalbital-APAP-Caff-Cod 50-300-40-30 MG CAPS Take 1 capsule by mouth every 4 (four) hours as needed (migraine).    [provider]  carvedilol (COREG) 6.25 MG tablet Take 1 tablet (6.25 mg total) by mouth 2 (two) times daily. 04/22/22   Mamie Levers, NP  cholecalciferol (VITAMIN D3) 25 MCG (1000 UNIT) tablet Take 1,000 Units by mouth in the morning.    [provider]  EPINEPHrine 0.3 mg/0.3 mL IJ SOAJ injection Inject 0.3 mg into the muscle as needed for anaphylaxis. 12/05/20   Petrucelli,  Samantha R, PA-C  furosemide (LASIX) 20 MG tablet Take 20 mg by mouth as directed. M-W-F '40mg'$  tabs, other days '20mg'$  tabs    [provider]  guaifenesin (ROBITUSSIN) 100 MG/5ML syrup Take 400 mg by mouth at bedtime.    [provider]  hydrALAZINE (APRESOLINE) 10 MG tablet Take 10 mg by mouth 3 (three) times daily as needed (bp over 160/90). 03/07/22   [provider]  HYDROcodone-acetaminophen (NORCO/VICODIN) 5-325 MG tablet SMARTSIG:1 Tablet(s) By Mouth Every 12 Hours PRN 04/13/22   [provider]  hydrOXYzine (ATARAX) 25 MG tablet Take 50 mg by mouth daily as needed for itching.    [provider]  iron polysaccharides (NIFEREX) 150 MG capsule Take 150 mg by mouth in the morning.    [provider]  isosorbide mononitrate (IMDUR) 60 MG 24 hr tablet TAKE 1 TABLET BY MOUTH EVERY DAY 12/13/21   Camnitz, Ocie Doyne, MD  KLOR-CON M20 20 MEQ tablet TAKE 1 TABLET BY MOUTH EVERY DAY 12/20/21   Camnitz, Ocie Doyne, MD  MAGNESIUM CITRATE PO Take 250 mg by mouth at bedtime.    [provider]  Multiple Vitamins-Minerals (PRESERVISION AREDS 2) CHEW Chew 1 tablet by mouth in the morning and at bedtime.    [provider]  olmesartan (BENICAR) 40 MG tablet Take 1 tablet (40 mg total) by mouth every evening. 03/23/22   Roderic Palau  C, NP  Polyethyl Glycol-Propyl Glycol (SYSTANE) 0.4-0.3 % SOLN Apply 1-2 drops to eye 4 (four) times daily as needed (dry/irritated eyes.).    [provider]  Rivaroxaban (XARELTO) 15 MG TABS tablet Take 1 tablet (15 mg total) by mouth daily with supper. DO NOT RESTART until 11/22/2021 11/22/21   Ezequiel Essex, MD  sertraline (ZOLOFT) 50 MG tablet Take 50 mg by mouth at bedtime. 02/12/20   [provider]  tamsulosin (FLOMAX) 0.4 MG CAPS capsule Take 0.4 mg by mouth daily with lunch.    [provider]  triamcinolone (NASACORT ALLERGY 24HR) 55 MCG/ACT AERO nasal inhaler Place 2 sprays  into the nose 2 (two) times daily.    [provider]  valACYclovir (VALTREX) 1000 MG tablet Take 1,000 mg by mouth at bedtime.    [provider]  zolpidem (AMBIEN) 10 MG tablet Take 10 mg by mouth at bedtime.    [provider]      Allergies    Bee venom, Codeine, Iodine, Oxycodone, Penicillins, Benadryl [diphenhydramine], Diltiazem, Diatrizoate, Ivp dye [iodinated contrast media], Tape, and Yohimbine    Review of Systems   Review of Systems  Physical Exam Updated Vital Signs BP 130/84 (BP Location: Left Arm)   Pulse 80   Temp 97.7 F (36.5 C) (Oral)   Resp 20   Ht 1.727 m ('5\' 8"'$ )   Wt 71.2 kg   SpO2 95%   BMI 23.87 kg/m  Physical Exam Vitals reviewed.  Constitutional:      Appearance: Normal appearance.  HENT:     Head:      Comments: Laceration to occiput    Right Ear: External ear normal.     Left Ear: External ear normal.     Nose: Nose normal.     Mouth/Throat:     Pharynx: Oropharynx is clear.  Eyes:     Comments: Right.  I is absent  Cardiovascular:     Rate and Rhythm: Normal rate and regular rhythm.  Pulmonary:     Effort: Pulmonary effort is normal.     Breath sounds: Normal breath sounds.  Abdominal:     General: Abdomen is flat.  Musculoskeletal:     Cervical back: Normal range of motion and neck supple. No tenderness.     Comments: No point tenderness noted over cervical, thoracic, or lumbar spine There are some contusions noted on the left buttock but these appear to be old and patient reports that these are from prior fall Skin tears present on right hand  Skin:    General: Skin is warm and dry.     Capillary Refill: Capillary refill takes less than 2 seconds.  Neurological:     General: No focal deficit present.     Mental Status: He is alert.  Psychiatric:        Mood and Affect: Mood normal.     ED Results / Procedures / Treatments   Labs (all labs ordered are listed, but only abnormal results are  displayed) Labs Reviewed - No data to display  EKG None  Radiology DG Wrist Complete Right  Result Date: 07/26/2022 CLINICAL DATA:  pain after fall EXAM: RIGHT WRIST - COMPLETE 3+ VIEW COMPARISON:  None Available. FINDINGS: There is no evidence of acute fracture. Severe base of thumb osteoarthritis. Slight dorsal tilt of the lunate. IMPRESSION: No acute osseous abnormality. Severe base of thumb osteoarthritis. Electronically Signed   By: Maurine Simmering M.D.   On: 07/26/2022 08:26  DG Hand Complete Right  Result Date: 07/26/2022 CLINICAL DATA:  pain after fall EXAM: RIGHT HAND - COMPLETE 3+ VIEW COMPARISON:  None Available. FINDINGS: There is no evidence of acute fracture. There is severe triscaphe and first [place] joint osteoarthritis. There is moderate moderate MCP, PIP, and DIP joint osteoarthritis throughout. IMPRESSION: No evidence of acute fracture. Severe base of thumb osteoarthritis. Mild to moderate MCP and diffuse IP joint osteoarthritis. Electronically Signed   By: Maurine Simmering M.D.   On: 07/26/2022 08:24   DG Hip Unilat W or Wo Pelvis 2-3 Views Right  Result Date: 07/26/2022 CLINICAL DATA:  pain after fall EXAM: DG HIP (WITH OR WITHOUT PELVIS) 2-3V RIGHT COMPARISON:  CT 12/12/2020 FINDINGS: There is no evidence of acute fracture. Alignment is normal. There is moderate right and mild left hip osteoarthritis. There is a possible small lucent lesion in the right inferior pubic ramus, no correlate on prior CT in July 2022. Degenerative changes of the lower lumbar spine, partially visualized. IMPRESSION: No evidence of acute right hip fracture. Note that if the patient is unable to bear weight or if clinical suspicion for non-displaced hip fracture is significant, CT or MRI would be more sensitive. Possible small lucent lesion in the right inferior pubic ramus. This could be further evaluated on CT if clinically indicated. Electronically Signed   By: Maurine Simmering M.D.   On: 07/26/2022 08:21   CT  Head Wo Contrast  Result Date: 07/26/2022 CLINICAL DATA:  Head trauma. EXAM: CT HEAD WITHOUT CONTRAST CT CERVICAL SPINE WITHOUT CONTRAST TECHNIQUE: Multidetector CT imaging of the head and cervical spine was performed following the standard protocol without intravenous contrast. Multiplanar CT image reconstructions of the cervical spine were also generated. RADIATION DOSE REDUCTION: This exam was performed according to the departmental dose-optimization program which includes automated exposure control, adjustment of the mA and/or kV according to patient size and/or use of iterative reconstruction technique. COMPARISON:  06/06/2022 FINDINGS: CT HEAD FINDINGS Brain: No evidence of acute infarction, hemorrhage, hydrocephalus, extra-axial collection or mass lesion/mass effect. Generalized cerebral volume loss. Chronic small vessel ischemia to a moderate degree in the cerebral white matter. Chronic lacune at the right basal ganglia. Vascular: No hyperdense vessel or unexpected calcification. Skull: Scalp swelling on the left.  No underlying fracture. Sinuses/Orbits: No evidence of injury. Right enucleation and prosthesis. CT CERVICAL SPINE FINDINGS Alignment: C2-3 and C4-5 mild anterolisthesis, degenerative. Skull base and vertebrae: Remote T1 and and T2 superior endplate fractures with mild height loss. No acute fracture or incidental bone lesion. Generalized osteopenia Soft tissues and spinal canal: No prevertebral fluid or swelling. No visible canal hematoma. Disc levels: Generalized degenerative spurring with foraminal narrowing especially at C3-4 to C5-6. Upper chest: Layering left pleural effusion. IMPRESSION: No evidence of acute intracranial or cervical spine injury. Electronically Signed   By: Jorje Guild M.D.   On: 07/26/2022 08:04   CT Cervical Spine Wo Contrast  Result Date: 07/26/2022 CLINICAL DATA:  Head trauma. EXAM: CT HEAD WITHOUT CONTRAST CT CERVICAL SPINE WITHOUT CONTRAST TECHNIQUE:  Multidetector CT imaging of the head and cervical spine was performed following the standard protocol without intravenous contrast. Multiplanar CT image reconstructions of the cervical spine were also generated. RADIATION DOSE REDUCTION: This exam was performed according to the departmental dose-optimization program which includes automated exposure control, adjustment of the mA and/or kV according to patient size and/or use of iterative reconstruction technique. COMPARISON:  06/06/2022 FINDINGS: CT HEAD FINDINGS Brain: No evidence of acute infarction, hemorrhage,  hydrocephalus, extra-axial collection or mass lesion/mass effect. Generalized cerebral volume loss. Chronic small vessel ischemia to a moderate degree in the cerebral white matter. Chronic lacune at the right basal ganglia. Vascular: No hyperdense vessel or unexpected calcification. Skull: Scalp swelling on the left.  No underlying fracture. Sinuses/Orbits: No evidence of injury. Right enucleation and prosthesis. CT CERVICAL SPINE FINDINGS Alignment: C2-3 and C4-5 mild anterolisthesis, degenerative. Skull base and vertebrae: Remote T1 and and T2 superior endplate fractures with mild height loss. No acute fracture or incidental bone lesion. Generalized osteopenia Soft tissues and spinal canal: No prevertebral fluid or swelling. No visible canal hematoma. Disc levels: Generalized degenerative spurring with foraminal narrowing especially at C3-4 to C5-6. Upper chest: Layering left pleural effusion. IMPRESSION: No evidence of acute intracranial or cervical spine injury. Electronically Signed   By: Jorje Guild M.D.   On: 07/26/2022 08:04    Procedures .Marland KitchenLaceration Repair  Date/Time: 07/26/2022 9:03 AM  Performed by: Pattricia Boss, MD Authorized by: Pattricia Boss, MD   Consent:    Consent obtained:  Verbal   Consent given by:  Patient and spouse   Risks discussed:  Pain   Alternatives discussed:  No treatment Universal protocol:    Patient  identity confirmed:  Verbally with patient and arm band Anesthesia:    Anesthesia method:  Local infiltration   Local anesthetic:  Lidocaine 1% WITH epi Laceration details:    Location:  Scalp   Scalp location:  Occipital   Length (cm):  6 Pre-procedure details:    Preparation:  Patient was prepped and draped in usual sterile fashion and imaging obtained to evaluate for foreign bodies Exploration:    Limited defect created (wound extended): no     Hemostasis achieved with:  Direct pressure   Imaging outcome: foreign body not noted     Wound exploration: wound explored through full range of motion     Contaminated: no   Treatment:    Area cleansed with:  Saline   Amount of cleaning:  Standard   Irrigation solution:  Sterile water   Irrigation method:  Syringe   Visualized foreign bodies/material removed: no     Debridement:  None   Undermining:  None   Scar revision: no   Skin repair:    Repair method:  Staples   Number of staples:  6 Approximation:    Approximation:  Close Repair type:    Repair type:  Simple Post-procedure details:    Dressing:  Non-adherent dressing   Procedure completion:  Tolerated     Medications Ordered in ED Medications  lidocaine-EPINEPHrine (XYLOCAINE W/EPI) 2 %-1:200000 (PF) injection 10 mL (has no administration in time range)    ED Course/ Medical Decision Making/ A&P Clinical Course as of 07/26/22 0908  Tue Jul 26, 2022  0838 No acute fracture seen in hand or wrist x-Posey Jasmin [DR]  0839 Right hip x-Vanecia Limpert reviewed interpreted no evidence acute fracture noted radiologist interpretation concurs [DR]  0843 CT head cervical spine reviewed and no evidence of acute abnormality noted radiologist interpretation concurs [DR]    Clinical Course User Index [DR] Pattricia Boss, MD                             Medical Decision Making Amount and/or Complexity of Data Reviewed Radiology: ordered.  Risk Prescription drug management.   89 year old  male who has had multiple frequent falls thought to be due to balance issues, on Xarelto, presents  today after mechanical fall.  Patient had 6 cm laceration to the back of his head that was repaired.  He has multiple skin tears that were repaired by his wife prior to evaluation here.  His tetanus is up-to-date.  He was evaluated with imaging of head, neck, arm, and hip without any evidence of acute fracture.  I discussed suture removal, return precautions, need for follow-up and wife voices understanding.       Final Clinical Impression(s) / ED Diagnoses Final diagnoses:  Fall, initial encounter  Chronic anticoagulation  Laceration of scalp without foreign body, initial encounter  Skin tear of hand without complication, initial encounter    Rx / DC Orders ED Discharge Orders     None         Pattricia Boss, MD 07/26/22 [phone]

## 2022-07-26 NOTE — Discharge Instructions (Signed)
You were evaluated here in the emergency department for a fall today.  You had a head CT of your head and neck without any evidence of acute abnormality. Additionally, you had plain x-rays of your hand, wrist, and hip.  These did not show any evidence of fractures. Your head was repaired with staples and these should remain in place for 7 to 10 days. If there are any signs of bleeding in your head such as increased pain, confusion, or weakness, you should return immediately for evaluation symptoms of infection to the wounds include redness, pus, increased pain, and red streaking.  If you have any of these, you should be reevaluated immediately

## 2022-07-28 DIAGNOSIS — Z85820 Personal history of malignant melanoma of skin: Secondary | ICD-10-CM | POA: Diagnosis not present

## 2022-07-28 DIAGNOSIS — J449 Chronic obstructive pulmonary disease, unspecified: Secondary | ICD-10-CM | POA: Diagnosis not present

## 2022-07-28 DIAGNOSIS — I4891 Unspecified atrial fibrillation: Secondary | ICD-10-CM | POA: Diagnosis not present

## 2022-07-28 DIAGNOSIS — R058 Other specified cough: Secondary | ICD-10-CM | POA: Diagnosis not present

## 2022-07-28 DIAGNOSIS — J309 Allergic rhinitis, unspecified: Secondary | ICD-10-CM | POA: Diagnosis not present

## 2022-07-29 ENCOUNTER — Emergency Department (HOSPITAL_BASED_OUTPATIENT_CLINIC_OR_DEPARTMENT_OTHER)
Admission: EM | Admit: 2022-07-29 | Discharge: 2022-07-29 | Disposition: A | Discharge status: Home / Self Care | Payer: Medicare PPO | Attending: Emergency Medicine | Admitting: Emergency Medicine

## 2022-07-29 DIAGNOSIS — Z79899 Other long term (current) drug therapy: Secondary | ICD-10-CM | POA: Insufficient documentation

## 2022-07-29 DIAGNOSIS — S59912A Unspecified injury of left forearm, initial encounter: Secondary | ICD-10-CM | POA: Diagnosis present

## 2022-07-29 DIAGNOSIS — S51812A Laceration without foreign body of left forearm, initial encounter: Secondary | ICD-10-CM

## 2022-07-29 DIAGNOSIS — Y9301 Activity, walking, marching and hiking: Secondary | ICD-10-CM | POA: Insufficient documentation

## 2022-07-29 DIAGNOSIS — W1839XA Other fall on same level, initial encounter: Secondary | ICD-10-CM | POA: Diagnosis not present

## 2022-07-29 DIAGNOSIS — W19XXXA Unspecified fall, initial encounter: Secondary | ICD-10-CM

## 2022-07-29 NOTE — Discharge Instructions (Signed)
Please call your family doctor today and let them know that you had another fall.  See when they can see you in the office.

## 2022-07-29 NOTE — ED Notes (Signed)
9 steri strips applied to left forearm.

## 2022-07-29 NOTE — ED Provider Notes (Signed)
[facility] Provider Note   CSN: [id number] Arrival date & time: 07/29/22  [id number]     History  Chief Complaint  Patient presents with   Lytle Michaels    Austin [name] is a 89 y.o. male.  89 yo M with a chief complaint of a fall.  The patient unfortunately has been suffering from frequent falls.  Said he was walking with his walker and she just lost control of it and fell against it.  Complaining of cuts to the bilateral forearms.  The wife thought that one looked fine and she Steri-Stripped it and was worried about the depth of the other and brought him here for evaluation.  Tetanus is up-to-date.  Denies any head injury neck pain chest pain abdominal pain.  Denied any other specific injury.   Fall       Home Medications Prior to Admission medications   Medication Sig Start Date End Date Taking? Authorizing Provider  albuterol (VENTOLIN HFA) 108 (90 Base) MCG/ACT inhaler Inhale 2 puffs into the lungs every 4 (four) hours as needed for wheezing or shortness of breath (cough).    [provider]  amLODipine (NORVASC) 2.5 MG tablet Take 1 tablet (2.5 mg total) by mouth daily. 06/01/22   Sherran Needs, NP  benzonatate (TESSALON) 100 MG capsule Take 100 mg by mouth 3 (three) times daily as needed for cough.    [provider]  budesonide-formoterol (SYMBICORT) 80-4.5 MCG/ACT inhaler Inhale 2 puffs into the lungs 2 (two) times daily.     [provider]  Butalbital-APAP-Caff-Cod 50-300-40-30 MG CAPS Take 1 capsule by mouth every 4 (four) hours as needed (migraine).    [provider]  carvedilol (COREG) 6.25 MG tablet Take 1 tablet (6.25 mg total) by mouth 2 (two) times daily. 04/22/22   Mamie Levers, NP  cholecalciferol (VITAMIN D3) 25 MCG (1000 UNIT) tablet Take 1,000 Units by mouth in the morning.    [provider]  EPINEPHrine 0.3 mg/0.3 mL IJ SOAJ injection Inject 0.3 mg into the muscle as needed  for anaphylaxis. 12/05/20   Petrucelli, Samantha R, PA-C  furosemide (LASIX) 20 MG tablet Take 20 mg by mouth as directed. M-W-F 40mg  tabs, other days 20mg  tabs    [provider]  guaifenesin (ROBITUSSIN) 100 MG/5ML syrup Take 400 mg by mouth at bedtime.    [provider]  hydrALAZINE (APRESOLINE) 10 MG tablet Take 10 mg by mouth 3 (three) times daily as needed (bp over 160/90). 03/07/22   [provider]  HYDROcodone-acetaminophen (NORCO/VICODIN) 5-325 MG tablet SMARTSIG:1 Tablet(s) By Mouth Every 12 Hours PRN 04/13/22   [provider]  hydrOXYzine (ATARAX) 25 MG tablet Take 50 mg by mouth daily as needed for itching.    [provider]  iron polysaccharides (NIFEREX) 150 MG capsule Take 150 mg by mouth in the morning.    [provider]  isosorbide mononitrate (IMDUR) 60 MG 24 hr tablet TAKE 1 TABLET BY MOUTH EVERY DAY 12/13/21   Camnitz, Ocie Doyne, MD  KLOR-CON M20 20 MEQ tablet TAKE 1 TABLET BY MOUTH EVERY DAY 12/20/21   Camnitz, Ocie Doyne, MD  MAGNESIUM CITRATE PO Take 250 mg by mouth at bedtime.    [provider]  Multiple Vitamins-Minerals (PRESERVISION AREDS 2) CHEW Chew 1 tablet by mouth in the morning and at bedtime.    [provider]  olmesartan (BENICAR) 40 MG tablet Take 1 tablet (40 mg total) by mouth  every evening. 03/23/22   Sherran Needs, NP  Polyethyl Glycol-Propyl Glycol (SYSTANE) 0.4-0.3 % SOLN Apply 1-2 drops to eye 4 (four) times daily as needed (dry/irritated eyes.).    [provider]  Rivaroxaban (XARELTO) 15 MG TABS tablet Take 1 tablet (15 mg total) by mouth daily with supper. DO NOT RESTART until 11/22/2021 11/22/21   Ezequiel Essex, MD  sertraline (ZOLOFT) 50 MG tablet Take 50 mg by mouth at bedtime. 02/12/20   [provider]  tamsulosin (FLOMAX) 0.4 MG CAPS capsule Take 0.4 mg by mouth daily with lunch.    [provider]  triamcinolone (NASACORT ALLERGY 24HR) 55  MCG/ACT AERO nasal inhaler Place 2 sprays into the nose 2 (two) times daily.    [provider]  valACYclovir (VALTREX) 1000 MG tablet Take 1,000 mg by mouth at bedtime.    [provider]  zolpidem (AMBIEN) 10 MG tablet Take 10 mg by mouth at bedtime.    [provider]      Allergies    Bee venom, Codeine, Iodine, Oxycodone, Penicillins, Benadryl [diphenhydramine], Diltiazem, Diatrizoate, Ivp dye [iodinated contrast media], Tape, and Yohimbine    Review of Systems   Review of Systems  Physical Exam Updated Vital Signs BP 116/76 (BP Location: Right Arm)   Pulse 75   Temp 97.7 F (36.5 C) (Oral)   Resp 16   SpO2 95%  Physical Exam Vitals and nursing note reviewed.  Constitutional:      Appearance: He is well-developed.  HENT:     Head: Normocephalic and atraumatic.  Eyes:     Pupils: Pupils are equal, round, and reactive to light.  Neck:     Vascular: No JVD.  Cardiovascular:     Rate and Rhythm: Normal rate and regular rhythm.     Heart sounds: No murmur heard.    No friction rub. No gallop.  Pulmonary:     Effort: No respiratory distress.     Breath sounds: No wheezing.  Abdominal:     General: There is no distension.     Tenderness: There is no abdominal tenderness. There is no guarding or rebound.  Musculoskeletal:        General: Normal range of motion.     Cervical back: Normal range of motion and neck supple.     Comments: No obvious bony tenderness on palpation from head to toe.  No midline spinal tenderness stepoffs or deformities.  He is able to range his head 45 degrees in either direction without pain.  He has multiple old areas of injury.  He has a fairly large skin tear to the left forearm along the ulnar aspect.  Has exposed musculature.  Has a recently repaired skin tear to the right forearm.  Skin:    Coloration: Skin is not pale.     Findings: No rash.  Neurological:     Mental Status: He is alert and oriented to person,  place, and time.  Psychiatric:        Behavior: Behavior normal.     ED Results / Procedures / Treatments   Labs (all labs ordered are listed, but only abnormal results are displayed) Labs Reviewed - No data to display  EKG None  Radiology No results found.  Procedures Procedures    Medications Ordered in ED Medications - No data to display  ED Course/ Medical Decision Making/ A&P  Medical Decision Making  89 yo M with a chief complaint of a fall.  Nonsyncopal by history.  Patient with multiple skin tears.  Steri-Stripped at bedside.  Will have him follow-up with his family doctor in the office.  8:23 AM:  I have discussed the diagnosis/risks/treatment options with the patient and family.  Evaluation and diagnostic testing in the emergency department does not suggest an emergent condition requiring admission or immediate intervention beyond what has been performed at this time.  They will follow up with PCP. We also discussed returning to the ED immediately if new or worsening sx occur. We discussed the sx which are most concerning (e.g., sudden worsening pain, fever, inability to tolerate by mouth) that necessitate immediate return. Medications administered to the patient during their visit and any new prescriptions provided to the patient are listed below.  Medications given during this visit Medications - No data to display   The patient appears reasonably screen and/or stabilized for discharge and I doubt any other medical condition or other [facility] requiring further screening, evaluation, or treatment in the ED at this time prior to discharge.          Final Clinical Impression(s) / ED Diagnoses Final diagnoses:  Fall, initial encounter  Skin tear of left forearm without complication, initial encounter    Rx / DC Orders ED Discharge Orders     None         Deno Etienne, DO 07/29/22 [phone]

## 2022-07-29 NOTE — ED Notes (Signed)
Wound cleaned, irrigated, and dressed by Hassan Rowan, NT.

## 2022-07-29 NOTE — ED Triage Notes (Signed)
[place] over his walker today. Was here 3 days ago for a fall as well. Multiple skin tears/lacerations noted. Takes xarelto. No head impact today.

## 2022-07-30 ENCOUNTER — Other Ambulatory Visit: Payer: Self-pay | Admitting: Cardiology

## 2022-07-30 DIAGNOSIS — I10 Essential (primary) hypertension: Secondary | ICD-10-CM

## 2022-07-30 DIAGNOSIS — I4819 Other persistent atrial fibrillation: Secondary | ICD-10-CM

## 2022-07-30 DIAGNOSIS — N183 Chronic kidney disease, stage 3 unspecified: Secondary | ICD-10-CM

## 2022-08-03 ENCOUNTER — Telehealth: Payer: Self-pay

## 2022-08-03 DIAGNOSIS — R531 Weakness: Secondary | ICD-10-CM | POA: Diagnosis not present

## 2022-08-03 DIAGNOSIS — Z0000 Encounter for general adult medical examination without abnormal findings: Secondary | ICD-10-CM | POA: Diagnosis not present

## 2022-08-03 DIAGNOSIS — J449 Chronic obstructive pulmonary disease, unspecified: Secondary | ICD-10-CM | POA: Diagnosis not present

## 2022-08-03 DIAGNOSIS — D509 Iron deficiency anemia, unspecified: Secondary | ICD-10-CM | POA: Diagnosis not present

## 2022-08-03 DIAGNOSIS — M1990 Unspecified osteoarthritis, unspecified site: Secondary | ICD-10-CM | POA: Diagnosis not present

## 2022-08-03 DIAGNOSIS — R296 Repeated falls: Secondary | ICD-10-CM | POA: Diagnosis not present

## 2022-08-03 DIAGNOSIS — I10 Essential (primary) hypertension: Secondary | ICD-10-CM | POA: Diagnosis not present

## 2022-08-03 DIAGNOSIS — I4891 Unspecified atrial fibrillation: Secondary | ICD-10-CM | POA: Diagnosis not present

## 2022-08-03 NOTE — Telephone Encounter (Signed)
     Patient  visit on 3/15  at [facility]   Have you been able to follow up with your primary care physician? Yes   The patient was or was not able to obtain any needed medicine or equipment. Yes   Are there diet recommendations that you are having difficulty following? Na   Patient expresses understanding of discharge instructions and education provided has no other needs at this time.  Yes     [facility] [phone] [address], [place], [place] [address] Phone: [phone] Email: [email or web address]

## 2022-08-04 ENCOUNTER — Other Ambulatory Visit: Payer: Self-pay | Admitting: Cardiology

## 2022-08-04 DIAGNOSIS — S0101XA Laceration without foreign body of scalp, initial encounter: Secondary | ICD-10-CM | POA: Diagnosis not present

## 2022-08-04 DIAGNOSIS — L03114 Cellulitis of left upper limb: Secondary | ICD-10-CM | POA: Diagnosis not present

## 2022-08-04 DIAGNOSIS — R296 Repeated falls: Secondary | ICD-10-CM | POA: Diagnosis not present

## 2022-08-04 DIAGNOSIS — T07XXXA Unspecified multiple injuries, initial encounter: Secondary | ICD-10-CM | POA: Diagnosis not present

## 2022-08-04 DIAGNOSIS — Z4802 Encounter for removal of sutures: Secondary | ICD-10-CM | POA: Diagnosis not present

## 2022-08-04 DIAGNOSIS — I4891 Unspecified atrial fibrillation: Secondary | ICD-10-CM | POA: Diagnosis not present

## 2022-08-04 DIAGNOSIS — J441 Chronic obstructive pulmonary disease with (acute) exacerbation: Secondary | ICD-10-CM | POA: Diagnosis not present

## 2022-09-14 DEATH — deceased

## 2022-11-07 ENCOUNTER — Ambulatory Visit: Payer: Medicare PPO | Attending: Cardiology | Admitting: Cardiology

## 2022-11-07 ENCOUNTER — Encounter: Payer: Self-pay | Admitting: Cardiology
